# Patient Record
Sex: Male | Born: 1935
Health system: Southern US, Community
[De-identification: ages and names within clinical notes are randomized; demographics above are authoritative.]

## PROBLEM LIST (undated history)

## (undated) DIAGNOSIS — M719 Bursopathy, unspecified: Secondary | ICD-10-CM

## (undated) DIAGNOSIS — R351 Nocturia: Secondary | ICD-10-CM

## (undated) DIAGNOSIS — N189 Chronic kidney disease, unspecified: Secondary | ICD-10-CM

## (undated) DIAGNOSIS — E039 Hypothyroidism, unspecified: Secondary | ICD-10-CM

## (undated) DIAGNOSIS — D369 Benign neoplasm, unspecified site: Secondary | ICD-10-CM

## (undated) DIAGNOSIS — D890 Polyclonal hypergammaglobulinemia: Principal | ICD-10-CM

## (undated) DIAGNOSIS — J189 Pneumonia, unspecified organism: Secondary | ICD-10-CM

## (undated) DIAGNOSIS — K219 Gastro-esophageal reflux disease without esophagitis: Secondary | ICD-10-CM

## (undated) DIAGNOSIS — I10 Essential (primary) hypertension: Secondary | ICD-10-CM

## (undated) HISTORY — DX: Gastro-esophageal reflux disease without esophagitis: K21.9

## (undated) HISTORY — DX: Nocturia: R35.1

## (undated) HISTORY — DX: Essential (primary) hypertension: I10

## (undated) HISTORY — DX: Polyclonal hypergammaglobulinemia: D89.0

## (undated) HISTORY — PX: SKIN SURGERY: SHX2413

## (undated) HISTORY — PX: COLONOSCOPY: SHX174

## (undated) HISTORY — DX: Benign neoplasm, unspecified site: D36.9

## (undated) HISTORY — DX: Hypothyroidism, unspecified: E03.9

---

## 2004-10-17 ENCOUNTER — Ambulatory Visit (HOSPITAL_COMMUNITY): Admission: RE | Admit: 2004-10-17 | Discharge: 2004-10-17 | Payer: Self-pay | Admitting: Gastroenterology

## 2004-10-17 ENCOUNTER — Encounter (INDEPENDENT_AMBULATORY_CARE_PROVIDER_SITE_OTHER): Payer: Self-pay | Admitting: *Deleted

## 2009-05-10 ENCOUNTER — Encounter (INDEPENDENT_AMBULATORY_CARE_PROVIDER_SITE_OTHER): Payer: Self-pay | Admitting: Surgery

## 2009-05-10 ENCOUNTER — Ambulatory Visit (HOSPITAL_BASED_OUTPATIENT_CLINIC_OR_DEPARTMENT_OTHER): Admission: RE | Admit: 2009-05-10 | Discharge: 2009-05-10 | Payer: Self-pay | Admitting: Surgery

## 2011-01-12 ENCOUNTER — Other Ambulatory Visit: Payer: Self-pay | Admitting: Otolaryngology

## 2011-04-17 NOTE — Op Note (Signed)
NAME:  Lonnie [name], Lonnie [name] NO.:  [id number]   MEDICAL RECORD NO.:  [id number]          PATIENT TYPE:  AMB   LOCATION:  [place]                          FACILITY:  [facility]   PHYSICIAN:  Isabel Caprice. Hassell Done, MD  DATE OF BIRTH:  [birth year]   DATE OF PROCEDURE:  05/10/2009  DATE OF DISCHARGE:                               OPERATIVE REPORT   PREOPERATIVE DIAGNOSIS:  A 2-cm sebaceous cyst in middle of back.   POSTOPERATIVE DIAGNOSIS:  Liquefied sebaceous cyst.   PROCEDURE:  Excision and closure.   SURGEON:  Isabel Caprice. Hassell Done, MD   ANESTHESIA:  Local 1%.   DESCRIPTION OF PROCEDURE:  The area was prepped with Betadine.  Mr.  Bazil was in the prone position.  The area was injected with lidocaine  and an ellipse about 2 cm long was cut out including the punctum.  This  revealed a liquefied cyst.  I went ahead and was able to express the  portion that was not liquefied leaving the side and back wall  tenaciously stuck to the surrounding tissue.  I dissected these out as  best I could with a sharp blade dissection removing this.  When  complete, bleeding was controlled with compression and closed with 2-0  Vicryl subcutaneously and two 4-0 nylons.  The patient has a sterile  dressing applied and will be given doxycycline 100 mg to take 2 on day 1  and 1 daily for 5 days and will follow up for suture removal in about 7-  10 days.      Isabel Caprice Hassell Done, MD  Electronically Signed     MBM/MEDQ  D:  05/10/2009  T:  05/11/2009  Job:  [id number]   cc:   Berneta Sages, M.D.

## 2011-04-20 NOTE — Op Note (Signed)
NAME:  Lonnie [name], Lonnie [name] NO.:  [id number]   MEDICAL RECORD NO.:  [id number]          PATIENT TYPE:  AMB   LOCATION:  ENDO                         FACILITY:  [facility]   PHYSICIAN:  Tory Emerald. Benson Norway, MD    DATE OF BIRTH:  Aug 30, 1925   DATE OF PROCEDURE:  10/17/2004  DATE OF DISCHARGE:                                 OPERATIVE REPORT   PROCEDURE PERFORMED:  Colonoscopy.   INDICATIONS FOR PROCEDURE:  Screening colonoscopy and constipation.   REFERRING PHYSICIAN:  Olegario Shearer, M.D.   ENDOSCOPIST:  Tory Emerald. Benson Norway, MD   INSTRUMENT USED:  Olympus adult colonoscope.   PHYSICAL EXAMINATION:  CARDIAC:  Regular rate and rhythm.  LUNGS:  Clear to auscultation bilaterally.  ABDOMEN:  Flat, soft, nontender, nondistended, positive bowel sounds.   MEDICATIONS GIVEN:  Versed 6 mg IV, Demerol 60 mg IV.   CONSENT:  Informed consent was obtained from the patient describing the  risks of bleeding, infection, perforation, medication reaction, a 10% miss  rate for a small colon cancer or polyp and the risk of death, all of which  are not exclusive of any other complications that can occur.   DESCRIPTION OF PROCEDURE:  After adequate sedation was achieved a rectal  examination was performed which was negative for any palpable abnormalities.  The colonoscope was then inserted from the anus and advanced under direct  visualization to the terminal ileum without difficulty. The patient was  noted to have an excellent preparation and photodocumentation of the cecum  and terminal ileum was performed.  Upon withdrawal of the colonoscope, the  patient was noted to have two 3 mm polyps in the ascending colon that were  removed with a cold snare polypectomy.  Upon further withdrawal into the  transverse colon, an additional 3 mm sessile polyp was also removed with  cold snare polypectomy.  Good hemostasis was achieved.  No complications  were encountered.  There was no evidence of any  masses, polyps,  inflammation, ulcerations or erosions, vascular abnormalities or diverticula  in the descending or the sigmoid colon.  With retroflexion in the rectum the  patient is documented to have large external hemorrhoids.  At this time the  colonoscope was straightened and withdrawn from the patient.  The patient  tolerated the procedure well.   PLAN:  1.  Follow up on biopsies.  2.  Repeat colonoscopy in five years.  3.  Maintain a high fiber diet.       PDH/MEDQ  D:  10/17/2004  T:  10/17/2004  Job:  [id number]   cc:   Olegario Shearer, M.D.  [address]  [place] [address]  Fax: [phone]

## 2011-09-17 ENCOUNTER — Ambulatory Visit
Admission: RE | Admit: 2011-09-17 | Discharge: 2011-09-17 | Disposition: A | Discharge status: Home / Self Care | Payer: Medicare Other | Source: Ambulatory Visit | Attending: Family Medicine | Admitting: Family Medicine

## 2011-09-17 ENCOUNTER — Other Ambulatory Visit: Payer: Self-pay | Admitting: Family Medicine

## 2011-09-17 DIAGNOSIS — R609 Edema, unspecified: Secondary | ICD-10-CM

## 2011-12-10 DIAGNOSIS — H04129 Dry eye syndrome of unspecified lacrimal gland: Secondary | ICD-10-CM | POA: Diagnosis not present

## 2011-12-10 DIAGNOSIS — H16109 Unspecified superficial keratitis, unspecified eye: Secondary | ICD-10-CM | POA: Diagnosis not present

## 2011-12-14 DIAGNOSIS — N401 Enlarged prostate with lower urinary tract symptoms: Secondary | ICD-10-CM | POA: Diagnosis not present

## 2012-01-10 DIAGNOSIS — K219 Gastro-esophageal reflux disease without esophagitis: Secondary | ICD-10-CM | POA: Diagnosis not present

## 2012-01-10 DIAGNOSIS — E039 Hypothyroidism, unspecified: Secondary | ICD-10-CM | POA: Diagnosis not present

## 2012-01-10 DIAGNOSIS — I10 Essential (primary) hypertension: Secondary | ICD-10-CM | POA: Diagnosis not present

## 2012-01-11 ENCOUNTER — Encounter: Payer: Self-pay | Admitting: Internal Medicine

## 2012-01-17 DIAGNOSIS — R3915 Urgency of urination: Secondary | ICD-10-CM | POA: Diagnosis not present

## 2012-01-17 DIAGNOSIS — R350 Frequency of micturition: Secondary | ICD-10-CM | POA: Diagnosis not present

## 2012-01-24 DIAGNOSIS — R350 Frequency of micturition: Secondary | ICD-10-CM | POA: Diagnosis not present

## 2012-01-25 ENCOUNTER — Encounter: Payer: Self-pay | Admitting: Internal Medicine

## 2012-01-25 ENCOUNTER — Ambulatory Visit (INDEPENDENT_AMBULATORY_CARE_PROVIDER_SITE_OTHER): Payer: Medicare Other | Admitting: Internal Medicine

## 2012-01-25 VITALS — BP 142/70 | HR 68 | Ht 69.0 in | Wt 190.0 lb

## 2012-01-25 DIAGNOSIS — K219 Gastro-esophageal reflux disease without esophagitis: Secondary | ICD-10-CM

## 2012-01-25 DIAGNOSIS — Z86010 Personal history of colonic polyps: Secondary | ICD-10-CM | POA: Diagnosis not present

## 2012-01-25 NOTE — Progress Notes (Signed)
HISTORY OF PRESENT ILLNESS:  Lonnie [name] is a 78 y.o. male hypertension, hypothyroidism, and a history of adenomatous colon polyps. He presents today regarding surveillance colonoscopy. He was sent to the office first regarding evaluation due to his age. The patient had multiple adenomatous polyps on his index colonoscopy, with Dr. Carol Ada, in November 2005. I have reviewed that colonoscopy report as well as the pathology.. He apparently received a recall letter. He was interested in changing gastroenterologists as he wanted to have this procedure done, not at the hospital, but rather it an [place]. Despite his age he is quite active. No family history of colon cancer. He is quite interested in having a surveillance exam performed. GI review of systems negative. He does have GERD for which he takes ranitidine. No dysphagia. No reflux symptoms.. Recently retired for second time from the post office.  REVIEW OF SYSTEMS:  All non-GI ROS negative except for  Past Medical History  Diagnosis Date  . Adenomatous polyps   . GERD (gastroesophageal reflux disease)   . Hypothyroidism   . Hypertension   . Frequent urination at night     Past Surgical History  Procedure Date  . Skin surgery     back    Social History Lonnie [name]  reports that he quit smoking about 32 years ago. He has never used smokeless tobacco. He reports that he does not drink alcohol or use illicit drugs.  family history includes Diabetes in his brother and father and Heart disease in his brother.  There is no history of Colon cancer.  No Known Allergies     PHYSICAL EXAMINATION: Vital signs: BP 142/70  Pulse 68  Ht 5\' 9"  (1.753 m)  Wt 190 lb (86.183 kg)  BMI 28.06 kg/m2 General: Well-developed, well-nourished, no acute distress HEENT: Sclerae are anicteric, conjunctiva pink. Oral mucosa intact Lungs: Clear Heart: Regular Abdomen: soft, nontender, nondistended, no obvious ascites, no peritoneal signs,  normal bowel sounds. No organomegaly. Extremities: No edema Psychiatric: alert and oriented x3. Cooperative    ASSESSMENT:  #1. History of adenomatous colon polyps. Due for followup. Despite his advanced age, he is in excellent physical condition. He is quite interested in having his surveillance exam performed. We discussed the pros and cons of the strategy given his age. He is still interested. There are no contraindications. #2. GERD. Asymptomatic on H2 receptor antagonist therapy.   PLAN:  #1. Surveillance colonoscopy.The nature of the procedure, as well as the risks, benefits, and alternatives were carefully and thoroughly reviewed with the patient. Ample time for discussion and questions allowed. The patient understood, was satisfied, and agreed to proceed. Movi prep prescribed. The patient instructed on its use #2. Continue H2 receptor antagonist therapy and reflux precautions

## 2012-01-25 NOTE — Patient Instructions (Signed)
You have been scheduled for a colonoscopy with propofol. Please follow written instructions given to you at your visit today.  Please pick up your prep kit at the pharmacy within the next 1-3 days.  

## 2012-01-28 ENCOUNTER — Other Ambulatory Visit: Payer: Self-pay | Admitting: Internal Medicine

## 2012-01-28 ENCOUNTER — Other Ambulatory Visit: Payer: Self-pay

## 2012-01-28 MED ORDER — PEG-KCL-NACL-NASULF-NA ASC-C 100 G PO SOLR: 1.0000 | Freq: Once | ORAL | Status: DC

## 2012-02-05 NOTE — Telephone Encounter (Signed)
completed

## 2012-02-13 ENCOUNTER — Encounter: Payer: Self-pay | Admitting: Internal Medicine

## 2012-02-13 ENCOUNTER — Ambulatory Visit (AMBULATORY_SURGERY_CENTER): Payer: Medicare Other | Admitting: Internal Medicine

## 2012-02-13 VITALS — BP 177/78 | HR 64 | Temp 98.1°F | Resp 20 | Ht 69.0 in | Wt 190.0 lb

## 2012-02-13 DIAGNOSIS — K219 Gastro-esophageal reflux disease without esophagitis: Secondary | ICD-10-CM | POA: Diagnosis not present

## 2012-02-13 DIAGNOSIS — D126 Benign neoplasm of colon, unspecified: Secondary | ICD-10-CM

## 2012-02-13 DIAGNOSIS — Z86010 Personal history of colonic polyps: Secondary | ICD-10-CM

## 2012-02-13 DIAGNOSIS — Z1211 Encounter for screening for malignant neoplasm of colon: Secondary | ICD-10-CM

## 2012-02-13 DIAGNOSIS — E079 Disorder of thyroid, unspecified: Secondary | ICD-10-CM | POA: Diagnosis not present

## 2012-02-13 MED ORDER — SODIUM CHLORIDE 0.9 % IV SOLN: 500.0000 mL | INTRAVENOUS | Status: DC

## 2012-02-13 NOTE — Patient Instructions (Signed)

## 2012-02-13 NOTE — Progress Notes (Signed)
Propofol per l beeson crna. See scanned intra procedure report. ewm

## 2012-02-13 NOTE — Progress Notes (Signed)
Patient did not experience any of the following events: a burn prior to discharge; a fall within the facility; wrong site/side/patient/procedure/implant event; or a hospital transfer or hospital admission upon discharge from the facility. (G8907) Patient did not have preoperative order for IV antibiotic SSI prophylaxis. (G8918)  

## 2012-02-13 NOTE — Op Note (Signed)
[facility]. [place], [place]  [address]  COLONOSCOPY PROCEDURE REPORT  PATIENT:  Lonnie [name], Lonnie [name]  MR#:  [id number] BIRTHDATE:  12-25-1924, 86 yrs. old  GENDER:  male ENDOSCOPIST:  Docia Chuck. Geri Seminole, MD REF. BY:  Prince Solian, M.D. PROCEDURE DATE:  02/13/2012 PROCEDURE:  Colonoscopy with snare polypectomy x 2 ASA CLASS:  Class II INDICATIONS:  history of pre-cancerous (adenomatous) colon polyps, surveillance and high-risk screening ; 2005, multiple polyps, TVA - Dr Benson Norway MEDICATIONS:   MAC sedation, administered by CRNA, propofol (Diprivan) 250 mg IV  DESCRIPTION OF PROCEDURE:   After the risks benefits and alternatives of the procedure were thoroughly explained, informed consent was obtained.  Digital rectal exam was performed and revealed no abnormalities.   The LB 180AL [id number] endoscope was introduced through the anus and advanced to the cecum, which was identified by both the appendix and ileocecal valve, without limitations.  The quality of the prep was excellent, using MoviPrep.  The instrument was then slowly withdrawn as the colon was fully examined. <<PROCEDUREIMAGES>>  FINDINGS:  A 68mm sessile polyp was found at the ileocecal valve. Polyp was snared piecemeal, then cauterized with monopolar cautery. Retrieval was successful.  A 52mm pedunculated polyp was found in the ascending colon. Polyp was snared without cautery. Retrieval was successful. Severe diverticulosis was found ascending colon and sigmoid colon.   Retroflexed views in the rectum revealed no abnormalities.    The time to cecum =   3:59 minutes. The scope was then withdrawn in 22:07  minutes from the cecum and the procedure completed.  COMPLICATIONS:  None  ENDOSCOPIC IMPRESSION: 1) Large Sessile polyp at the ileocecal valve - removed piecemeal 2) Pedunculated polyp in the ascending colon - removed 3) Severe diverticulosis ascending colon and sigmoid  colon  RECOMMENDATIONS: 1) Await pathology results 2) Repeat Colonoscopy in 3 - 4 months  ______________________________ Docia Chuck. Geri Seminole, MD  CC:  Prince Solian, MD; The Patient  n. eSIGNED:   Docia Chuck. Geri Seminole at 02/13/2012 03:15 PM  Hiram Gash, [id number]

## 2012-02-14 ENCOUNTER — Telehealth: Payer: Self-pay | Admitting: *Deleted

## 2012-02-14 NOTE — Telephone Encounter (Signed)
  Follow up Call-  Call back number 02/13/2012  Post procedure Call Back phone  # [phone]  Permission to leave phone message Yes     Patient questions:  Do you have a fever, pain , or abdominal swelling? no Pain Score  0 *  Have you tolerated food without any problems? yes  Have you been able to return to your normal activities? yes  Do you have any questions about your discharge instructions: Diet   no Medications  no Follow up visit  no  Do you have questions or concerns about your Care? no  Actions: * If pain score is 4 or above: No action needed, pain <4.

## 2012-02-19 ENCOUNTER — Encounter: Payer: Self-pay | Admitting: Internal Medicine

## 2012-02-20 DIAGNOSIS — R350 Frequency of micturition: Secondary | ICD-10-CM | POA: Diagnosis not present

## 2012-02-20 DIAGNOSIS — N401 Enlarged prostate with lower urinary tract symptoms: Secondary | ICD-10-CM | POA: Diagnosis not present

## 2012-03-24 DIAGNOSIS — N401 Enlarged prostate with lower urinary tract symptoms: Secondary | ICD-10-CM | POA: Diagnosis not present

## 2012-04-02 DIAGNOSIS — H25049 Posterior subcapsular polar age-related cataract, unspecified eye: Secondary | ICD-10-CM | POA: Diagnosis not present

## 2012-04-08 DIAGNOSIS — H02409 Unspecified ptosis of unspecified eyelid: Secondary | ICD-10-CM | POA: Diagnosis not present

## 2012-04-08 DIAGNOSIS — H25049 Posterior subcapsular polar age-related cataract, unspecified eye: Secondary | ICD-10-CM | POA: Diagnosis not present

## 2012-04-14 DIAGNOSIS — H2510 Age-related nuclear cataract, unspecified eye: Secondary | ICD-10-CM | POA: Diagnosis not present

## 2012-04-14 DIAGNOSIS — H25049 Posterior subcapsular polar age-related cataract, unspecified eye: Secondary | ICD-10-CM | POA: Diagnosis not present

## 2012-05-02 DIAGNOSIS — N401 Enlarged prostate with lower urinary tract symptoms: Secondary | ICD-10-CM | POA: Diagnosis not present

## 2012-05-16 ENCOUNTER — Encounter: Payer: Self-pay | Admitting: Internal Medicine

## 2012-07-02 ENCOUNTER — Ambulatory Visit (AMBULATORY_SURGERY_CENTER): Payer: Medicare Other

## 2012-07-02 VITALS — Ht 69.75 in | Wt 187.3 lb

## 2012-07-02 DIAGNOSIS — Z860100 Personal history of colon polyps, unspecified: Secondary | ICD-10-CM

## 2012-07-02 MED ORDER — MOVIPREP 100 G PO SOLR: 1.0000 | Freq: Once | ORAL | Status: DC

## 2012-07-07 DIAGNOSIS — R351 Nocturia: Secondary | ICD-10-CM | POA: Diagnosis not present

## 2012-07-07 DIAGNOSIS — R350 Frequency of micturition: Secondary | ICD-10-CM | POA: Diagnosis not present

## 2012-07-14 DIAGNOSIS — E039 Hypothyroidism, unspecified: Secondary | ICD-10-CM | POA: Diagnosis not present

## 2012-07-14 DIAGNOSIS — I10 Essential (primary) hypertension: Secondary | ICD-10-CM | POA: Diagnosis not present

## 2012-07-14 DIAGNOSIS — Z23 Encounter for immunization: Secondary | ICD-10-CM | POA: Diagnosis not present

## 2012-07-14 DIAGNOSIS — N400 Enlarged prostate without lower urinary tract symptoms: Secondary | ICD-10-CM | POA: Diagnosis not present

## 2012-07-14 DIAGNOSIS — D126 Benign neoplasm of colon, unspecified: Secondary | ICD-10-CM | POA: Diagnosis not present

## 2012-07-16 ENCOUNTER — Encounter: Payer: Self-pay | Admitting: Internal Medicine

## 2012-07-16 ENCOUNTER — Ambulatory Visit (AMBULATORY_SURGERY_CENTER): Payer: Medicare Other | Admitting: Internal Medicine

## 2012-07-16 VITALS — BP 165/74 | HR 72 | Temp 97.7°F | Resp 20 | Ht 69.0 in | Wt 187.0 lb

## 2012-07-16 DIAGNOSIS — D126 Benign neoplasm of colon, unspecified: Secondary | ICD-10-CM | POA: Diagnosis not present

## 2012-07-16 DIAGNOSIS — Z1211 Encounter for screening for malignant neoplasm of colon: Secondary | ICD-10-CM

## 2012-07-16 DIAGNOSIS — Z86010 Personal history of colonic polyps: Secondary | ICD-10-CM | POA: Diagnosis not present

## 2012-07-16 MED ORDER — SODIUM CHLORIDE 0.9 % IV SOLN: 500.0000 mL | INTRAVENOUS | Status: DC

## 2012-07-16 NOTE — Patient Instructions (Signed)
YOU HAD AN ENDOSCOPIC PROCEDURE TODAY AT THE [facility]: Refer to the procedure report that was given to you for any specific questions about what was found during the examination.  If the procedure report does not answer your questions, please call your gastroenterologist to clarify.  If you requested that your care partner not be given the details of your procedure findings, then the procedure report has been included in a sealed envelope for you to review at your convenience later.  YOU SHOULD EXPECT: Some feelings of bloating in the abdomen. Passage of more gas than usual.  Walking can help get rid of the air that was put into your GI tract during the procedure and reduce the bloating. If you had a lower endoscopy (such as a colonoscopy or flexible sigmoidoscopy) you may notice spotting of blood in your stool or on the toilet paper. If you underwent a bowel prep for your procedure, then you may not have a normal bowel movement for a few days.  DIET: Your first meal following the procedure should be a light meal and then it is ok to progress to your normal diet.  A half-sandwich or bowl of soup is an example of a good first meal.  Heavy or fried foods are harder to digest and may make you feel nauseous or bloated.  Likewise meals heavy in dairy and vegetables can cause extra gas to form and this can also increase the bloating.  Drink plenty of fluids but you should avoid alcoholic beverages for 24 hours.  ACTIVITY: Your care partner should take you home directly after the procedure.  You should plan to take it easy, moving slowly for the rest of the day.  You can resume normal activity the day after the procedure however you should NOT DRIVE or use heavy machinery for 24 hours (because of the sedation medicines used during the test).    SYMPTOMS TO REPORT IMMEDIATELY: A gastroenterologist can be reached at any hour.  During normal business hours, 8:30 AM to 5:00 PM Monday through Friday,  call [phone].  After hours and on weekends, please call the GI answering service at [phone] who will take a message and have the physician on call contact you.   Following lower endoscopy (colonoscopy or flexible sigmoidoscopy):  Excessive amounts of blood in the stool  Significant tenderness or worsening of abdominal pains  Swelling of the abdomen that is new, acute  Fever of 100F or higher  Following upper endoscopy (EGD)  Vomiting of blood or coffee ground material  New chest pain or pain under the shoulder blades  Painful or persistently difficult swallowing  New shortness of breath  Fever of 100F or higher  Black, tarry-looking stools  FOLLOW UP: If any biopsies were taken you will be contacted by phone or by letter within the next 1-3 weeks.  Call your gastroenterologist if you have not heard about the biopsies in 3 weeks.  Our staff will call the home number listed on your records the next business day following your procedure to check on you and address any questions or concerns that you may have at that time regarding the information given to you following your procedure. This is a courtesy call and so if there is no answer at the home number and we have not heard from you through the emergency physician on call, we will assume that you have returned to your regular daily activities without incident.  SIGNATURES/CONFIDENTIALITY: You and/or your care   partner have signed paperwork which will be entered into your electronic medical record.  These signatures attest to the fact that that the information above on your After Visit Summary has been reviewed and is understood.  Full responsibility of the confidentiality of this discharge information lies with you and/or your care-partner.   HANDOUTS ON [POLYPS, DIVERTICULOSIS, HIGH FIBER DIET

## 2012-07-16 NOTE — Progress Notes (Signed)
Patient did not have preoperative order for IV antibiotic SSI prophylaxis. (G8918)  Patient did not experience any of the following events: a burn prior to discharge; a fall within the facility; wrong site/side/patient/procedure/implant event; or a hospital transfer or hospital admission upon discharge from the facility. (G8907)  

## 2012-07-16 NOTE — Op Note (Signed)
[facility]. [place], [place]  [address]  COLONOSCOPY PROCEDURE REPORT  PATIENT:  Lonnie [name], Lonnie [name]  MR#:  [id number] BIRTHDATE:  1925-11-23, 87 yrs. old  GENDER:  male ENDOSCOPIST:  Docia Chuck. Geri Seminole, MD REF. BY:  Surveillance Program Recall, PROCEDURE DATE:  07/16/2012 PROCEDURE:  Colonoscopy with snare polypectomy x 5 ASA CLASS:  Class II INDICATIONS:  history of pre-cancerous (adenomatous) colon polyps, surveillance and high-risk screening ; hx multiple adenomas elsewhere. last exam here 02-2012 w/ large TVA - piecemeal MEDICATIONS:   MAC sedation, administered by CRNA, propofol (Diprivan) 170 mg IV  DESCRIPTION OF PROCEDURE:   After the risks benefits and alternatives of the procedure were thoroughly explained, informed consent was obtained.  Digital rectal exam was performed and revealed no abnormalities.   The LB CF-H180AL [id number] endoscope was introduced through the anus and advanced to the cecum, which was identified by both the appendix and ileocecal valve, without limitations.  The quality of the prep was good, using MoviPrep. The instrument was then slowly withdrawn as the colon was fully examined. <<PROCEDUREIMAGES>>  FINDINGS:  13mm sessile polyp was found at the proximal ileocecal valve (residual from prior polypectomy) was snared, then cauterized with monopolar cautery. Retrieval was successful.  Four additional polyps were found in the cecum (10mm), ascending  colon (37mm) and the hepatic flexure (9mm) and (52mm hot snare). Three Polyps were snared without cautery. Retrieval was successful. Mild diverticulosis was found in the left colon.   Retroflexed views in the rectum revealed no abnormalities.    The time to cecum = 5 minutes. The scope was then withdrawn in  19  minutes from the cecum and the procedure completed. COMPLICATIONS:  None ENDOSCOPIC IMPRESSION: 1) Sessile polyp at the ileocecal valve (residual) -removed 2) Four polyps cecum  to hepatic flexure - removed 3) Mild diverticulosis in the left colon  RECOMMENDATIONS: 1) Await pathology results. We will send you a letter, as before. 2) No routine follow up planned. Return to the care of your primary provider. GI follow up as needed  ______________________________ Docia Chuck. Geri Seminole, MD  CC:  The Patient; Lonnie Solian, MD  n. Lorrin MaisDocia Chuck. Geri Seminole at 07/16/2012 12:47 PM  Hiram Gash, [id number]

## 2012-07-17 ENCOUNTER — Telehealth: Payer: Self-pay | Admitting: *Deleted

## 2012-07-17 NOTE — Telephone Encounter (Signed)
error 

## 2012-07-17 NOTE — Telephone Encounter (Signed)
No answer, left message to call if questions or concerns. 

## 2012-07-22 ENCOUNTER — Encounter: Payer: Self-pay | Admitting: Internal Medicine

## 2012-08-25 DIAGNOSIS — N401 Enlarged prostate with lower urinary tract symptoms: Secondary | ICD-10-CM | POA: Diagnosis not present

## 2012-08-25 DIAGNOSIS — R350 Frequency of micturition: Secondary | ICD-10-CM | POA: Diagnosis not present

## 2012-09-10 DIAGNOSIS — H47219 Primary optic atrophy, unspecified eye: Secondary | ICD-10-CM | POA: Diagnosis not present

## 2012-09-10 DIAGNOSIS — Z23 Encounter for immunization: Secondary | ICD-10-CM | POA: Diagnosis not present

## 2012-09-15 ENCOUNTER — Other Ambulatory Visit: Payer: Self-pay | Admitting: Otolaryngology

## 2012-09-15 DIAGNOSIS — D3701 Neoplasm of uncertain behavior of lip: Secondary | ICD-10-CM | POA: Diagnosis not present

## 2012-09-15 DIAGNOSIS — D3705 Neoplasm of uncertain behavior of pharynx: Secondary | ICD-10-CM | POA: Diagnosis not present

## 2012-09-15 DIAGNOSIS — K1370 Unspecified lesions of oral mucosa: Secondary | ICD-10-CM | POA: Diagnosis not present

## 2012-11-12 ENCOUNTER — Other Ambulatory Visit: Payer: Self-pay

## 2012-11-12 ENCOUNTER — Emergency Department (HOSPITAL_COMMUNITY): Payer: Medicare Other

## 2012-11-12 ENCOUNTER — Emergency Department (HOSPITAL_COMMUNITY)
Admission: EM | Admit: 2012-11-12 | Discharge: 2012-11-12 | Disposition: A | Discharge status: Home / Self Care | Payer: Medicare Other | Attending: Emergency Medicine | Admitting: Emergency Medicine

## 2012-11-12 DIAGNOSIS — Z8719 Personal history of other diseases of the digestive system: Secondary | ICD-10-CM | POA: Diagnosis not present

## 2012-11-12 DIAGNOSIS — S0010XA Contusion of unspecified eyelid and periocular area, initial encounter: Secondary | ICD-10-CM | POA: Insufficient documentation

## 2012-11-12 DIAGNOSIS — Z87891 Personal history of nicotine dependence: Secondary | ICD-10-CM | POA: Diagnosis not present

## 2012-11-12 DIAGNOSIS — S0993XA Unspecified injury of face, initial encounter: Secondary | ICD-10-CM | POA: Diagnosis not present

## 2012-11-12 DIAGNOSIS — S199XXA Unspecified injury of neck, initial encounter: Secondary | ICD-10-CM | POA: Diagnosis not present

## 2012-11-12 DIAGNOSIS — S0003XA Contusion of scalp, initial encounter: Secondary | ICD-10-CM | POA: Diagnosis not present

## 2012-11-12 DIAGNOSIS — M542 Cervicalgia: Secondary | ICD-10-CM | POA: Diagnosis not present

## 2012-11-12 DIAGNOSIS — Y92410 Unspecified street and highway as the place of occurrence of the external cause: Secondary | ICD-10-CM | POA: Insufficient documentation

## 2012-11-12 DIAGNOSIS — Z860100 Personal history of colon polyps, unspecified: Secondary | ICD-10-CM | POA: Insufficient documentation

## 2012-11-12 DIAGNOSIS — Z79899 Other long term (current) drug therapy: Secondary | ICD-10-CM | POA: Insufficient documentation

## 2012-11-12 DIAGNOSIS — I10 Essential (primary) hypertension: Secondary | ICD-10-CM | POA: Diagnosis not present

## 2012-11-12 DIAGNOSIS — W010XXA Fall on same level from slipping, tripping and stumbling without subsequent striking against object, initial encounter: Secondary | ICD-10-CM | POA: Insufficient documentation

## 2012-11-12 DIAGNOSIS — W19XXXA Unspecified fall, initial encounter: Secondary | ICD-10-CM

## 2012-11-12 DIAGNOSIS — E039 Hypothyroidism, unspecified: Secondary | ICD-10-CM | POA: Diagnosis not present

## 2012-11-12 DIAGNOSIS — S0081XA Abrasion of other part of head, initial encounter: Secondary | ICD-10-CM

## 2012-11-12 DIAGNOSIS — Y9301 Activity, walking, marching and hiking: Secondary | ICD-10-CM | POA: Insufficient documentation

## 2012-11-12 DIAGNOSIS — S0510XA Contusion of eyeball and orbital tissues, unspecified eye, initial encounter: Secondary | ICD-10-CM | POA: Diagnosis not present

## 2012-11-12 DIAGNOSIS — IMO0002 Reserved for concepts with insufficient information to code with codable children: Secondary | ICD-10-CM | POA: Insufficient documentation

## 2012-11-12 DIAGNOSIS — S0083XA Contusion of other part of head, initial encounter: Secondary | ICD-10-CM | POA: Diagnosis not present

## 2012-11-12 DIAGNOSIS — H05239 Hemorrhage of unspecified orbit: Secondary | ICD-10-CM

## 2012-11-12 MED ORDER — BACITRACIN 500 UNIT/GM EX OINT
1.0000 "application " | TOPICAL_OINTMENT | Freq: Two times a day (BID) | CUTANEOUS | Status: DC
  Filled 2012-11-12: qty 0.9

## 2012-11-12 NOTE — Progress Notes (Signed)
Pt fell, striking his head on a sidewalk.  Not KO'd.  Suffered a large contusion on the left eyebrow with associated abrasions.  CT of head, maxillofacial and neck all negative.  Rx symptomatically with bacitracin ointment to abrasions bid, Ice packs to reduce swelling.  OK to release home.

## 2012-11-12 NOTE — ED Notes (Addendum)
Per report from [facility] and patient.  Mr. Lonnie [name] was walking on the sidewalk and tripped on a piece of concrete this resulted in a fall.  He has noted L sided facial trauma.  Denies any visual difficulty.  A/o x 3 denies any LOC.  Denies neck or back pain.  Abrasion noted to L wrist but denies pain.

## 2012-11-12 NOTE — ED Provider Notes (Signed)
Medical screening examination/treatment/procedure(s) were conducted as a shared visit with non-physician practitioner(s) and myself.  I personally evaluated the patient during the encounter Pt fell, striking his head on a sidewalk. Not KO'd. Suffered a large contusion on the left eyebrow with associated abrasions. CT of head, maxillofacial and neck all negative. Rx symptomatically with bacitracin ointment to abrasions bid, Ice packs to reduce swelling. OK to release home.       Mylinda Latina III, MD 11/12/12 2103

## 2012-11-12 NOTE — ED Provider Notes (Signed)
History     CSN: [id number]  Arrival date & time 11/12/12  1158   First MD Initiated Contact with Patient 11/12/12 1259      Chief Complaint  Patient presents with  . Fall  . Abrasion    (Consider location/radiation/quality/duration/timing/severity/associated sxs/prior treatment) HPI Comments: Patient reports he was walking down the sidewalk when his foot caught an uneven part in the sidewalk and he tripped and fell, striking his left face on the ground.  Denies syncope.  Denies any pain anywhere, focal neurological deficits, pain with EOM, visual changes, HA, neck pain.  Denies any lightheadedness, dizziness, CP, SOB.  Pt does take an 81mg  aspirin daily.  Denies other blood thinners.    Patient is a 78 y.o. male presenting with fall. The history is provided by the patient.  Fall Pertinent negatives include no numbness, no abdominal pain, no nausea, no vomiting and no headaches.    Past Medical History  Diagnosis Date  . Adenomatous polyps   . GERD (gastroesophageal reflux disease)   . Hypothyroidism   . Hypertension   . Frequent urination at night     Past Surgical History  Procedure Date  . Skin surgery     back    Family History  Problem Relation Age of Onset  . Diabetes Father   . Diabetes Brother   . Heart disease Brother   . Colon cancer Neg Hx   . Rectal cancer Neg Hx     History  Substance Use Topics  . Smoking status: Former Smoker    Quit date: 01/25/1980  . Smokeless tobacco: Never Used  . Alcohol Use: No     Comment: former alcoholic 32 years ago was last drink      Review of Systems  HENT: Negative for neck pain.   Eyes: Negative for pain and visual disturbance.  Respiratory: Negative for shortness of breath.   Cardiovascular: Negative for chest pain.  Gastrointestinal: Negative for nausea, vomiting and abdominal pain.  Musculoskeletal: Negative for myalgias, back pain and arthralgias.  Neurological: Negative for dizziness, syncope,  weakness, light-headedness, numbness and headaches.    Allergies  Review of patient's allergies indicates no known allergies.  Home Medications   Current Outpatient Rx  Name  Route  Sig  Dispense  Refill  . FINASTERIDE 5 MG PO TABS   Oral   Take 5 mg by mouth every evening.          [name] FLUTICASONE PROPIONATE 50 MCG/ACT NA SUSP   Nasal   Place 2 sprays into the nose daily.         [name] MYRBETRIQ 25 MG PO TB24   Oral   Take 25 mg by mouth every evening.          [name] POLYETHYLENE GLYCOL 3350 PO PACK   Oral   Take 17 g by mouth every evening.          [name] RANITIDINE HCL 75 MG PO TABS   Oral   Take 150 mg by mouth daily.         [name] TERAZOSIN HCL 5 MG PO CAPS   Oral   Take 5 mg by mouth at bedtime.           BP 171/81  Pulse 81  Temp 97.2 F (36.2 C) (Oral)  Resp 16  SpO2 98%  Physical Exam  Nursing note and vitals reviewed. Constitutional: He appears well-developed and well-nourished. No distress.  HENT:  Head: Normocephalic. Head is with  abrasion and with contusion.    Neck: Neck supple.  Cardiovascular: Normal rate and regular rhythm.   Pulmonary/Chest: Effort normal and breath sounds normal. No respiratory distress. He has no wheezes. He has no rales. He exhibits no tenderness.  Abdominal: Soft. He exhibits no distension. There is no tenderness. There is no rebound and no guarding.  Musculoskeletal: Normal range of motion. He exhibits no edema and no tenderness.       Spine nontender, no crepitus, no step-offs  Neurological: He is alert. He has normal strength. No cranial nerve deficit or sensory deficit. He exhibits normal muscle tone. Coordination normal. GCS eye subscore is 4. GCS verbal subscore is 5. GCS motor subscore is 6.  Skin: He is not diaphoretic.  Psychiatric: He has a normal mood and affect. His behavior is normal. Thought content normal.    ED Course  Procedures (including critical care time)  Labs Reviewed - No data to display Ct Head  Wo Contrast  11/12/2012  *RADIOLOGY REPORT*  Clinical Data:  Fall.  Left-sided facial abrasions and pain.  CT HEAD WITHOUT CONTRAST CT MAXILLOFACIAL WITHOUT CONTRAST CT CERVICAL SPINE WITHOUT CONTRAST  Technique:  Multidetector CT imaging of the head, cervical spine, and maxillofacial structures were performed using the standard protocol without intravenous contrast. Multiplanar CT image reconstructions of the cervical spine and maxillofacial structures were also generated.  Comparison:   None  CT HEAD  Findings: No acute cortical infarct, hemorrhage, or mass lesion is present.  Mild generalized atrophy is within normal limits for age. Mild periventricular white matter hypoattenuation is present bilaterally.  The ventricles are of normal size.  No significant extra-axial fluid collection is present.  A large left periorbital hematoma extends into the frontal scalp. There is no underlying fracture.  The globe and orbit are intact. The paranasal sinuses and mastoid air cells are clear.  The osseous skull is intact.  Atherosclerotic calcifications are present in the cavernous carotid arteries.  IMPRESSION:  1.  Prominent left periorbital hematoma without an underlying fracture. 2.  Atherosclerosis. 3.  Mild atrophy and white matter disease is likely within normal limits for age.  CT MAXILLOFACIAL  Findings:  A large left periorbital hematoma is present.  The majority of hemorrhage is supraorbital.  The underlying globe and orbit are intact.  No acute fracture is evident.  The nasal bones are intact.  The paranasal sinuses and mastoid air cells are clear. And upper metallic denture plate is in place.  The soft tissues over the remainder of the face are unremarkable.  IMPRESSION: Large left periorbital hematoma without an underlying fracture.  CT CERVICAL SPINE  Findings:   The cervical spine is imaged from the skull base through T1-2.  The vertebral body heights maintained.  Slight degenerative anterolisthesis is  present at C7-T1.  Alignment is otherwise anatomic.  There is ankylosis across the disc space at C4- 5 and C5-6 with significant obliteration severe loss of disc height at to C3-4.  No acute fracture or traumatic subluxation is evident. Multilevel uncovertebral and facet hypertrophy is present. Atherosclerotic calcifications are more prominent on the right.  IMPRESSION:  1.  Moderate spondylosis of the cervical spine. 2.  No acute fracture or traumatic subluxation.   Original Report Authenticated By: San Morelle, M.D.    Ct Cervical Spine Wo Contrast  11/12/2012  *RADIOLOGY REPORT*  Clinical Data:  Fall.  Left-sided facial abrasions and pain.  CT HEAD WITHOUT CONTRAST CT MAXILLOFACIAL WITHOUT CONTRAST CT CERVICAL SPINE WITHOUT CONTRAST  Technique:  Multidetector CT imaging of the head, cervical spine, and maxillofacial structures were performed using the standard protocol without intravenous contrast. Multiplanar CT image reconstructions of the cervical spine and maxillofacial structures were also generated.  Comparison:   None  CT HEAD  Findings: No acute cortical infarct, hemorrhage, or mass lesion is present.  Mild generalized atrophy is within normal limits for age. Mild periventricular white matter hypoattenuation is present bilaterally.  The ventricles are of normal size.  No significant extra-axial fluid collection is present.  A large left periorbital hematoma extends into the frontal scalp. There is no underlying fracture.  The globe and orbit are intact. The paranasal sinuses and mastoid air cells are clear.  The osseous skull is intact.  Atherosclerotic calcifications are present in the cavernous carotid arteries.  IMPRESSION:  1.  Prominent left periorbital hematoma without an underlying fracture. 2.  Atherosclerosis. 3.  Mild atrophy and white matter disease is likely within normal limits for age.  CT MAXILLOFACIAL  Findings:  A large left periorbital hematoma is present.  The majority of  hemorrhage is supraorbital.  The underlying globe and orbit are intact.  No acute fracture is evident.  The nasal bones are intact.  The paranasal sinuses and mastoid air cells are clear. And upper metallic denture plate is in place.  The soft tissues over the remainder of the face are unremarkable.  IMPRESSION: Large left periorbital hematoma without an underlying fracture.  CT CERVICAL SPINE  Findings:   The cervical spine is imaged from the skull base through T1-2.  The vertebral body heights maintained.  Slight degenerative anterolisthesis is present at C7-T1.  Alignment is otherwise anatomic.  There is ankylosis across the disc space at C4- 5 and C5-6 with significant obliteration severe loss of disc height at to C3-4.  No acute fracture or traumatic subluxation is evident. Multilevel uncovertebral and facet hypertrophy is present. Atherosclerotic calcifications are more prominent on the right.  IMPRESSION:  1.  Moderate spondylosis of the cervical spine. 2.  No acute fracture or traumatic subluxation.   Original Report Authenticated By: San Morelle, M.D.    Ct Maxillofacial Wo Cm  11/12/2012  *RADIOLOGY REPORT*  Clinical Data:  Fall.  Left-sided facial abrasions and pain.  CT HEAD WITHOUT CONTRAST CT MAXILLOFACIAL WITHOUT CONTRAST CT CERVICAL SPINE WITHOUT CONTRAST  Technique:  Multidetector CT imaging of the head, cervical spine, and maxillofacial structures were performed using the standard protocol without intravenous contrast. Multiplanar CT image reconstructions of the cervical spine and maxillofacial structures were also generated.  Comparison:   None  CT HEAD  Findings: No acute cortical infarct, hemorrhage, or mass lesion is present.  Mild generalized atrophy is within normal limits for age. Mild periventricular white matter hypoattenuation is present bilaterally.  The ventricles are of normal size.  No significant extra-axial fluid collection is present.  A large left periorbital hematoma  extends into the frontal scalp. There is no underlying fracture.  The globe and orbit are intact. The paranasal sinuses and mastoid air cells are clear.  The osseous skull is intact.  Atherosclerotic calcifications are present in the cavernous carotid arteries.  IMPRESSION:  1.  Prominent left periorbital hematoma without an underlying fracture. 2.  Atherosclerosis. 3.  Mild atrophy and white matter disease is likely within normal limits for age.  CT MAXILLOFACIAL  Findings:  A large left periorbital hematoma is present.  The majority of hemorrhage is supraorbital.  The underlying globe and orbit are intact.  No  acute fracture is evident.  The nasal bones are intact.  The paranasal sinuses and mastoid air cells are clear. And upper metallic denture plate is in place.  The soft tissues over the remainder of the face are unremarkable.  IMPRESSION: Large left periorbital hematoma without an underlying fracture.  CT CERVICAL SPINE  Findings:   The cervical spine is imaged from the skull base through T1-2.  The vertebral body heights maintained.  Slight degenerative anterolisthesis is present at C7-T1.  Alignment is otherwise anatomic.  There is ankylosis across the disc space at C4- 5 and C5-6 with significant obliteration severe loss of disc height at to C3-4.  No acute fracture or traumatic subluxation is evident. Multilevel uncovertebral and facet hypertrophy is present. Atherosclerotic calcifications are more prominent on the right.  IMPRESSION:  1.  Moderate spondylosis of the cervical spine. 2.  No acute fracture or traumatic subluxation.   Original Report Authenticated By: San Morelle, M.D.     1:56 PM Dr Monia Pouch made aware of the patient.   1. Fall   2. Facial abrasion   3. Periorbital hematoma     MDM  Elderly patient who takes a daily aspirin presented after a mechanical fall today resulting in left facial abrasions and left periorbital hematoma.  Pt denied pain throughout visit.  No visual  changes, no neurological deficits.  CT head, c-spine, and maxillofacial were negative for fracture.  Pt also seen by Dr Monia Pouch who shared patient's results with him.  Pt d/c home with instructions for wound care, follow up, and return precautions.         [place], [place] 11/12/12 [phone]

## 2012-11-17 DIAGNOSIS — K219 Gastro-esophageal reflux disease without esophagitis: Secondary | ICD-10-CM | POA: Diagnosis not present

## 2012-11-17 DIAGNOSIS — I6529 Occlusion and stenosis of unspecified carotid artery: Secondary | ICD-10-CM | POA: Diagnosis not present

## 2012-11-17 DIAGNOSIS — R269 Unspecified abnormalities of gait and mobility: Secondary | ICD-10-CM | POA: Diagnosis not present

## 2012-11-17 DIAGNOSIS — I10 Essential (primary) hypertension: Secondary | ICD-10-CM | POA: Diagnosis not present

## 2012-11-17 DIAGNOSIS — E039 Hypothyroidism, unspecified: Secondary | ICD-10-CM | POA: Diagnosis not present

## 2012-11-24 DIAGNOSIS — R609 Edema, unspecified: Secondary | ICD-10-CM | POA: Diagnosis not present

## 2012-11-24 DIAGNOSIS — S93609A Unspecified sprain of unspecified foot, initial encounter: Secondary | ICD-10-CM | POA: Diagnosis not present

## 2013-01-09 DIAGNOSIS — E039 Hypothyroidism, unspecified: Secondary | ICD-10-CM | POA: Diagnosis not present

## 2013-01-09 DIAGNOSIS — R21 Rash and other nonspecific skin eruption: Secondary | ICD-10-CM | POA: Diagnosis not present

## 2013-01-09 DIAGNOSIS — I10 Essential (primary) hypertension: Secondary | ICD-10-CM | POA: Diagnosis not present

## 2013-01-28 DIAGNOSIS — I10 Essential (primary) hypertension: Secondary | ICD-10-CM | POA: Diagnosis not present

## 2013-01-28 DIAGNOSIS — Z1331 Encounter for screening for depression: Secondary | ICD-10-CM | POA: Diagnosis not present

## 2013-01-28 DIAGNOSIS — E039 Hypothyroidism, unspecified: Secondary | ICD-10-CM | POA: Diagnosis not present

## 2013-03-23 DIAGNOSIS — M25519 Pain in unspecified shoulder: Secondary | ICD-10-CM | POA: Diagnosis not present

## 2013-03-23 DIAGNOSIS — I10 Essential (primary) hypertension: Secondary | ICD-10-CM | POA: Diagnosis not present

## 2013-04-16 DIAGNOSIS — E039 Hypothyroidism, unspecified: Secondary | ICD-10-CM | POA: Diagnosis not present

## 2013-04-16 DIAGNOSIS — R634 Abnormal weight loss: Secondary | ICD-10-CM | POA: Diagnosis not present

## 2013-04-16 DIAGNOSIS — I10 Essential (primary) hypertension: Secondary | ICD-10-CM | POA: Diagnosis not present

## 2013-04-16 DIAGNOSIS — R5383 Other fatigue: Secondary | ICD-10-CM | POA: Diagnosis not present

## 2013-04-16 DIAGNOSIS — Z6827 Body mass index (BMI) 27.0-27.9, adult: Secondary | ICD-10-CM | POA: Diagnosis not present

## 2013-04-16 DIAGNOSIS — R7989 Other specified abnormal findings of blood chemistry: Secondary | ICD-10-CM | POA: Diagnosis not present

## 2013-05-05 DIAGNOSIS — R5383 Other fatigue: Secondary | ICD-10-CM | POA: Diagnosis not present

## 2013-05-05 DIAGNOSIS — M25519 Pain in unspecified shoulder: Secondary | ICD-10-CM | POA: Diagnosis not present

## 2013-05-05 DIAGNOSIS — R5381 Other malaise: Secondary | ICD-10-CM | POA: Diagnosis not present

## 2013-05-05 DIAGNOSIS — I10 Essential (primary) hypertension: Secondary | ICD-10-CM | POA: Diagnosis not present

## 2013-05-05 DIAGNOSIS — E039 Hypothyroidism, unspecified: Secondary | ICD-10-CM | POA: Diagnosis not present

## 2013-05-12 DIAGNOSIS — M25519 Pain in unspecified shoulder: Secondary | ICD-10-CM | POA: Diagnosis not present

## 2013-05-23 DIAGNOSIS — R5381 Other malaise: Secondary | ICD-10-CM | POA: Diagnosis not present

## 2013-05-23 DIAGNOSIS — IMO0001 Reserved for inherently not codable concepts without codable children: Secondary | ICD-10-CM | POA: Diagnosis not present

## 2013-05-27 DIAGNOSIS — M25519 Pain in unspecified shoulder: Secondary | ICD-10-CM | POA: Diagnosis not present

## 2013-06-02 DIAGNOSIS — M542 Cervicalgia: Secondary | ICD-10-CM | POA: Diagnosis not present

## 2013-06-02 DIAGNOSIS — M25519 Pain in unspecified shoulder: Secondary | ICD-10-CM | POA: Diagnosis not present

## 2013-06-04 DIAGNOSIS — M542 Cervicalgia: Secondary | ICD-10-CM | POA: Diagnosis not present

## 2013-06-04 DIAGNOSIS — M25519 Pain in unspecified shoulder: Secondary | ICD-10-CM | POA: Diagnosis not present

## 2013-06-09 DIAGNOSIS — M25519 Pain in unspecified shoulder: Secondary | ICD-10-CM | POA: Diagnosis not present

## 2013-06-09 DIAGNOSIS — M542 Cervicalgia: Secondary | ICD-10-CM | POA: Diagnosis not present

## 2013-06-11 DIAGNOSIS — M542 Cervicalgia: Secondary | ICD-10-CM | POA: Diagnosis not present

## 2013-06-11 DIAGNOSIS — M25519 Pain in unspecified shoulder: Secondary | ICD-10-CM | POA: Diagnosis not present

## 2013-06-15 DIAGNOSIS — R5381 Other malaise: Secondary | ICD-10-CM | POA: Diagnosis not present

## 2013-06-15 DIAGNOSIS — I10 Essential (primary) hypertension: Secondary | ICD-10-CM | POA: Diagnosis not present

## 2013-06-15 DIAGNOSIS — Z6828 Body mass index (BMI) 28.0-28.9, adult: Secondary | ICD-10-CM | POA: Diagnosis not present

## 2013-06-15 DIAGNOSIS — H538 Other visual disturbances: Secondary | ICD-10-CM | POA: Diagnosis not present

## 2013-06-15 DIAGNOSIS — E039 Hypothyroidism, unspecified: Secondary | ICD-10-CM | POA: Diagnosis not present

## 2013-06-15 DIAGNOSIS — R51 Headache: Secondary | ICD-10-CM | POA: Diagnosis not present

## 2013-06-15 DIAGNOSIS — Z1331 Encounter for screening for depression: Secondary | ICD-10-CM | POA: Diagnosis not present

## 2013-06-16 DIAGNOSIS — M542 Cervicalgia: Secondary | ICD-10-CM | POA: Diagnosis not present

## 2013-06-16 DIAGNOSIS — M25519 Pain in unspecified shoulder: Secondary | ICD-10-CM | POA: Diagnosis not present

## 2013-06-16 DIAGNOSIS — I6789 Other cerebrovascular disease: Secondary | ICD-10-CM | POA: Diagnosis not present

## 2013-06-16 DIAGNOSIS — R51 Headache: Secondary | ICD-10-CM | POA: Diagnosis not present

## 2013-06-18 DIAGNOSIS — M542 Cervicalgia: Secondary | ICD-10-CM | POA: Diagnosis not present

## 2013-06-18 DIAGNOSIS — M25519 Pain in unspecified shoulder: Secondary | ICD-10-CM | POA: Diagnosis not present

## 2013-07-08 DIAGNOSIS — M25519 Pain in unspecified shoulder: Secondary | ICD-10-CM | POA: Diagnosis not present

## 2013-07-27 DIAGNOSIS — R350 Frequency of micturition: Secondary | ICD-10-CM | POA: Diagnosis not present

## 2013-07-27 DIAGNOSIS — N401 Enlarged prostate with lower urinary tract symptoms: Secondary | ICD-10-CM | POA: Diagnosis not present

## 2013-09-14 DIAGNOSIS — J301 Allergic rhinitis due to pollen: Secondary | ICD-10-CM | POA: Diagnosis not present

## 2013-09-14 DIAGNOSIS — K219 Gastro-esophageal reflux disease without esophagitis: Secondary | ICD-10-CM | POA: Diagnosis not present

## 2013-09-14 DIAGNOSIS — E039 Hypothyroidism, unspecified: Secondary | ICD-10-CM | POA: Diagnosis not present

## 2013-09-14 DIAGNOSIS — I10 Essential (primary) hypertension: Secondary | ICD-10-CM | POA: Diagnosis not present

## 2013-09-14 DIAGNOSIS — Z6828 Body mass index (BMI) 28.0-28.9, adult: Secondary | ICD-10-CM | POA: Diagnosis not present

## 2013-10-16 DIAGNOSIS — M25559 Pain in unspecified hip: Secondary | ICD-10-CM | POA: Diagnosis not present

## 2013-10-16 DIAGNOSIS — M169 Osteoarthritis of hip, unspecified: Secondary | ICD-10-CM | POA: Diagnosis not present

## 2013-10-16 DIAGNOSIS — Z6828 Body mass index (BMI) 28.0-28.9, adult: Secondary | ICD-10-CM | POA: Diagnosis not present

## 2013-10-22 DIAGNOSIS — M76899 Other specified enthesopathies of unspecified lower limb, excluding foot: Secondary | ICD-10-CM | POA: Diagnosis not present

## 2013-11-19 DIAGNOSIS — D692 Other nonthrombocytopenic purpura: Secondary | ICD-10-CM | POA: Diagnosis not present

## 2013-12-09 DIAGNOSIS — R143 Flatulence: Secondary | ICD-10-CM | POA: Diagnosis not present

## 2013-12-09 DIAGNOSIS — Z6828 Body mass index (BMI) 28.0-28.9, adult: Secondary | ICD-10-CM | POA: Diagnosis not present

## 2013-12-09 DIAGNOSIS — R141 Gas pain: Secondary | ICD-10-CM | POA: Diagnosis not present

## 2014-03-19 DIAGNOSIS — N183 Chronic kidney disease, stage 3 unspecified: Secondary | ICD-10-CM | POA: Diagnosis not present

## 2014-03-19 DIAGNOSIS — Z79899 Other long term (current) drug therapy: Secondary | ICD-10-CM | POA: Diagnosis not present

## 2014-03-19 DIAGNOSIS — R5381 Other malaise: Secondary | ICD-10-CM | POA: Diagnosis not present

## 2014-03-19 DIAGNOSIS — N400 Enlarged prostate without lower urinary tract symptoms: Secondary | ICD-10-CM | POA: Diagnosis not present

## 2014-03-19 DIAGNOSIS — Z6828 Body mass index (BMI) 28.0-28.9, adult: Secondary | ICD-10-CM | POA: Diagnosis not present

## 2014-03-19 DIAGNOSIS — E039 Hypothyroidism, unspecified: Secondary | ICD-10-CM | POA: Diagnosis not present

## 2014-03-19 DIAGNOSIS — K219 Gastro-esophageal reflux disease without esophagitis: Secondary | ICD-10-CM | POA: Diagnosis not present

## 2014-03-19 DIAGNOSIS — R5383 Other fatigue: Secondary | ICD-10-CM | POA: Diagnosis not present

## 2014-03-19 DIAGNOSIS — I10 Essential (primary) hypertension: Secondary | ICD-10-CM | POA: Diagnosis not present

## 2014-03-23 DIAGNOSIS — N183 Chronic kidney disease, stage 3 unspecified: Secondary | ICD-10-CM | POA: Diagnosis not present

## 2014-03-31 DIAGNOSIS — N183 Chronic kidney disease, stage 3 unspecified: Secondary | ICD-10-CM | POA: Diagnosis not present

## 2014-03-31 DIAGNOSIS — Z79899 Other long term (current) drug therapy: Secondary | ICD-10-CM | POA: Diagnosis not present

## 2014-04-08 DIAGNOSIS — N183 Chronic kidney disease, stage 3 unspecified: Secondary | ICD-10-CM | POA: Diagnosis not present

## 2014-04-20 ENCOUNTER — Telehealth: Payer: Self-pay | Admitting: Internal Medicine

## 2014-04-20 NOTE — Telephone Encounter (Signed)
C/D 04/20/14 for appt. 05/05/14

## 2014-04-20 NOTE — Telephone Encounter (Signed)
S/W PATIENT AND GAVE NP APPT FOR 06/03 @ 1:30 W/DR. CHISM REFERRING DR. Dagmar Hait DX- PROTEINURIA WELCOME PACKET MAILED.

## 2014-05-05 ENCOUNTER — Ambulatory Visit (HOSPITAL_BASED_OUTPATIENT_CLINIC_OR_DEPARTMENT_OTHER): Payer: Medicare Other | Admitting: Internal Medicine

## 2014-05-05 ENCOUNTER — Encounter: Payer: Self-pay | Admitting: Internal Medicine

## 2014-05-05 ENCOUNTER — Telehealth: Payer: Self-pay | Admitting: Internal Medicine

## 2014-05-05 ENCOUNTER — Other Ambulatory Visit (HOSPITAL_BASED_OUTPATIENT_CLINIC_OR_DEPARTMENT_OTHER): Payer: Medicare Other

## 2014-05-05 ENCOUNTER — Other Ambulatory Visit: Payer: Self-pay | Admitting: Internal Medicine

## 2014-05-05 ENCOUNTER — Ambulatory Visit: Payer: Medicare Other

## 2014-05-05 VITALS — BP 148/74 | HR 70 | Temp 97.8°F | Resp 18 | Ht 69.0 in | Wt 190.2 lb

## 2014-05-05 DIAGNOSIS — E039 Hypothyroidism, unspecified: Secondary | ICD-10-CM | POA: Diagnosis not present

## 2014-05-05 DIAGNOSIS — R809 Proteinuria, unspecified: Secondary | ICD-10-CM | POA: Diagnosis not present

## 2014-05-05 DIAGNOSIS — D649 Anemia, unspecified: Secondary | ICD-10-CM | POA: Diagnosis not present

## 2014-05-05 DIAGNOSIS — R944 Abnormal results of kidney function studies: Secondary | ICD-10-CM | POA: Diagnosis not present

## 2014-05-05 LAB — COMPREHENSIVE METABOLIC PANEL (CC13)
ALBUMIN: 3.8 g/dL (ref 3.5–5.0)
ALT: 12 U/L (ref 0–55)
ANION GAP: 12 meq/L — AB (ref 3–11)
AST: 20 U/L (ref 5–34)
Alkaline Phosphatase: 103 U/L (ref 40–150)
BUN: 31.9 mg/dL — ABNORMAL HIGH (ref 7.0–26.0)
CHLORIDE: 103 meq/L (ref 98–109)
CO2: 20 meq/L — AB (ref 22–29)
Calcium: 9.1 mg/dL (ref 8.4–10.4)
Creatinine: 1.8 mg/dL — ABNORMAL HIGH (ref 0.7–1.3)
GLUCOSE: 93 mg/dL (ref 70–140)
POTASSIUM: 4.8 meq/L (ref 3.5–5.1)
SODIUM: 135 meq/L — AB (ref 136–145)
TOTAL PROTEIN: 7.3 g/dL (ref 6.4–8.3)
Total Bilirubin: 0.34 mg/dL (ref 0.20–1.20)

## 2014-05-05 LAB — CBC WITH DIFFERENTIAL/PLATELET
BASO%: 1.1 % (ref 0.0–2.0)
Basophils Absolute: 0.1 10*3/uL (ref 0.0–0.1)
EOS ABS: 0.6 10*3/uL — AB (ref 0.0–0.5)
EOS%: 9.4 % — ABNORMAL HIGH (ref 0.0–7.0)
HCT: 36.4 % — ABNORMAL LOW (ref 38.4–49.9)
HGB: 12.1 g/dL — ABNORMAL LOW (ref 13.0–17.1)
LYMPH#: 1.3 10*3/uL (ref 0.9–3.3)
LYMPH%: 20.6 % (ref 14.0–49.0)
MCH: 30.3 pg (ref 27.2–33.4)
MCHC: 33.2 g/dL (ref 32.0–36.0)
MCV: 91.4 fL (ref 79.3–98.0)
MONO#: 0.6 10*3/uL (ref 0.1–0.9)
MONO%: 9.5 % (ref 0.0–14.0)
NEUT%: 59.4 % (ref 39.0–75.0)
NEUTROS ABS: 3.8 10*3/uL (ref 1.5–6.5)
PLATELETS: 255 10*3/uL (ref 140–400)
RBC: 3.98 10*6/uL — AB (ref 4.20–5.82)
RDW: 13 % (ref 11.0–14.6)
WBC: 6.4 10*3/uL (ref 4.0–10.3)

## 2014-05-05 LAB — LACTATE DEHYDROGENASE (CC13): LDH: 180 U/L (ref 125–245)

## 2014-05-05 NOTE — Progress Notes (Signed)
[facility] [phone]   [phone]  NEW PATIENT EVALUATION   Name: Lonnie [name] Date: 05/05/2014 MRN: [id number] DOB: 07/29/1925  PCP: Tivis Ringer, MD   REFERRING PHYSICIAN: Avva, Pilar Plate, MD  REASON FOR REFERRAL: Proteinuria  HISTORY OF PRESENT ILLNESS:Carlas C Mchan is a 80 y.o. male who is being referred by Dr. Dagmar Hait for further evaluation of his proteinuria.  He was last evaluated by Dr. Dagmar Hait on Apr 19, 2014.  He was there for a routine follow up but complained of loss of energy.  He had stopped finsesteride due to breast enlargement.  He was having faigue and unable to perform his ADLs inclduing exercise.  He complained of feeling not rested and frequent nodding off when watching television.  It started about a few months ago.  He denies mood changes.  He has lost weight intentionally.  He reports a good appetite.  He denies any bone aches or pains.  He started Myrbetriq 25 mg every evening about one year ago.  He continues synthroid 88 mcg table daily for hypothyroidism.  He had his last colonoscopy on 07/16/2012 with removal of polpys.  His father died at age 25 due to DM2 complciations.  His mother died at age 50 due to aneursym.  He has 2 children who are living.  He was married for the past 67 years and wife passed away in 2007/01/22.  He attnded [employer] for 4 years and is now retired.  He has a history of tobacco use but quit in 1985-01-22.  He also has a history of alcoho use but quin in 1980.     His labs on 05/18,2015 revealed a total urine protein of 43.5 mg/dL and a 24 hour calculated protein of 978.8 mg/24 hours.  He had a SPEP which did not revieal an M-spike on 03/31/2014.  However, on 03/23/2014, his SPEP plus IFE showed IgG monoclonal protein with lambda light chain specificity.  His IgG level was normal at 1,240; IgA, 297 mg/dL; and IgM, 75 mg/dL.  His labs on 04/17 showed a creatinine of 1.8 (0.3 - 1.5 mg/dL); calcium of 9.2. Normal liver  function tests.  WBC of 6; Hgb of 11.3 with MCV of 93.4. And Plts of 249.  TSH was high at 5.09 and free t4 was 0.8 ng/mL. UA showed 2+ protein with negative nitrate and leukocytes. He lives with newphew in [place].  He has been in the AA for the last 35 years and he goes to weekly meetings.  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      PAST MEDICAL HISTORY:  has a past medical history of Adenomatous polyps; GERD (gastroesophageal reflux disease); Hypothyroidism; Hypertension; and Frequent urination at night.     PAST SURGICAL HISTORY: Past Surgical History  Procedure Laterality Date  . Skin surgery      back     CURRENT MEDICATIONS: has a current medication list which includes the following prescription(s): amlodipine, aspirin, escitalopram, fluticasone, myrbetriq, polyethylene glycol, ranitidine, and synthroid.   ALLERGIES: Review of patient's allergies indicates no known allergies.   SOCIAL HISTORY:  reports that he quit smoking about 34 years ago. He has never used smokeless tobacco. He reports that he does not drink alcohol or use illicit drugs.   FAMILY HISTORY: family history includes Diabetes in his brother and father; Heart disease in his brother. There is no history of Colon cancer or  Rectal cancer.   LABORATORY DATA:  Results for orders placed in visit on 05/05/14 (from the past 48 hour(s))  LACTATE DEHYDROGENASE (CC13)     Status: None   Collection Time    05/05/14  1:18 PM      Result Value Ref Range   LDH 180  125 - 245 U/L  CBC WITH DIFFERENTIAL     Status: Abnormal   Collection Time    05/05/14  1:19 PM      Result Value Ref Range   WBC 6.4  4.0 - 10.3 10e3/uL   NEUT# 3.8  1.5 - 6.5 10e3/uL   HGB 12.1 (*) 13.0 - 17.1 g/dL   HCT 36.4 (*) 38.4 - 49.9 %   Platelets 255  140 - 400 10e3/uL   MCV 91.4  79.3 - 98.0 fL   MCH 30.3  27.2 - 33.4 pg   MCHC 33.2  32.0 - 36.0 g/dL   RBC 3.98 (*) 4.20 - 5.82 10e6/uL     RDW 13.0  11.0 - 14.6 %   lymph# 1.3  0.9 - 3.3 10e3/uL   MONO# 0.6  0.1 - 0.9 10e3/uL   Eosinophils Absolute 0.6 (*) 0.0 - 0.5 10e3/uL   Basophils Absolute 0.1  0.0 - 0.1 10e3/uL   NEUT% 59.4  39.0 - 75.0 %   LYMPH% 20.6  14.0 - 49.0 %   MONO% 9.5  0.0 - 14.0 %   EOS% 9.4 (*) 0.0 - 7.0 %   BASO% 1.1  0.0 - 2.0 %  COMPREHENSIVE METABOLIC PANEL (QP59)     Status: Abnormal   Collection Time    05/05/14  1:19 PM      Result Value Ref Range   Sodium 135 (*) 136 - 145 mEq/L   Potassium 4.8  3.5 - 5.1 mEq/L   Chloride 103  98 - 109 mEq/L   CO2 20 (*) 22 - 29 mEq/L   Glucose 93  70 - 140 mg/dl   BUN 31.9 (*) 7.0 - 26.0 mg/dL   Creatinine 1.8 (*) 0.7 - 1.3 mg/dL   Total Bilirubin 0.34  0.20 - 1.20 mg/dL   Alkaline Phosphatase 103  40 - 150 U/L   AST 20  5 - 34 U/L   ALT 12  0 - 55 U/L   Total Protein 7.3  6.4 - 8.3 g/dL   Albumin 3.8  3.5 - 5.0 g/dL   Calcium 9.1  8.4 - 10.4 mg/dL   Anion Gap 12 (*) 3 - 11 mEq/L       RADIOGRAPHY: No results found.     REVIEW OF SYSTEMS:  Constitutional: Denies fevers, chills or abnormal weight loss Eyes: Denies blurriness of vision Ears, nose, mouth, throat, and face: Denies mucositis or sore throat Respiratory: Denies cough, dyspnea or wheezes Cardiovascular: Denies palpitation, chest discomfort or lower extremity swelling Gastrointestinal:  Denies nausea, heartburn or change in bowel habits Skin: Denies abnormal skin rashes Lymphatics: Denies new lymphadenopathy or easy bruising Neurological:Denies numbness, tingling or new weaknesses Behavioral/Psych: Mood is stable, no new changes  All other systems were reviewed with the patient and are negative.  PHYSICAL EXAM:  height is _0  (1.753 m) and weight is 190 lb 3.2 oz (86.274 kg). His oral temperature is 97.8 F (36.6 C). His blood pressure is 148/74 and his pulse is 70. His respiration is 18 and oxygen saturation is 100%.    GENERAL:alert, no distress and comfortable SKIN: skin  color, texture, turgor are normal,  no rashes or significant lesions EYES: normal, Conjunctiva are pink and non-injected, sclera clear OROPHARYNX:no exudate, no erythema and lips, buccal mucosa, and tongue normal  NECK: supple, thyroid normal size, non-tender, without nodularity LYMPH:  no palpable lymphadenopathy in the cervical, axillary or inguinal LUNGS: clear to auscultation and percussion with normal breathing effort HEART: regular rate & rhythm and no murmurs and no lower extremity edema ABDOMEN:abdomen soft, non-tender and normal bowel sounds Musculoskeletal:no cyanosis of digits and no clubbing  NEURO: alert & oriented x 3 with fluent speech, no focal motor/sensory deficits   IMPRESSION: PRESTEN JOOST is a 80 y.o. male with a history of    PLAN:  1.  Proteinuria. --We will check an SPEP with IFE, UPEP with IFE, kappa/lambda light chains and repeat his 24 hours urine.  If positive for IgG lambda, we will obtain a skeletal survey to examine for lytic lesions.  If abnormal Kappa:lambda ratio, we will obtain bone marrow biopsy to exclude plasma cells greater than 10%.  If MGUS, patient was informed that 1% chance of progression to Multiple myeloma per year.  If smothering MM, which is defined as plasma cells greater than 10% in bone marrow or Immuno globins greater than 3.5 g/dL without anemia (hbg less than 10) or kidney dysfunction (creatinine greater than 2.0) or hypercalcemia (Ca greater than 12), there is a 10% chance of progression per year for the first 5 years.   The patient's hemoglobin is 12.1 with normal WBC and Platelets.  His Creatinine is 1.8.    2. Elevated creatinine. --This could potentially be a result of senile kidney function.  Patient denies NSAID use or nephrotoxins.  He also admits to adequate hydration.  We will work up # 2 as a potential source as well.  His creatinine is 1.8.   3. Mild anemia. --He has a normocytic anemia.  This could be in part be explained due  to #1 or #2.  We will perform further anemia work-up as warranted.  He is clinically stable but does endorse increased fatigue. His hemoglobin is 12.1 with a normocytic MCV.   4. Hypothyroidism. --He is compliant to synthroid with a normal TSH as noted above.   5. Follow up.  --Patient will follow up in 2 weeks to discuss the results of his testing as indicated above.   All questions were answered. The patient knows to call the clinic with any problems, questions or concerns. We can certainly see the patient much sooner if necessary.  I spent 40 minutes counseling the patient face to face. The total time spent in the appointment was 60 minutes.    Concha Norway, MD 05/05/2014 2:50 PM

## 2014-05-05 NOTE — Progress Notes (Signed)
Checked in new patient with no financial issues at this time because he has not seen the dr. He has not been out of the country and he has his appt card

## 2014-05-05 NOTE — Telephone Encounter (Signed)
gv pt appt schedule for june.  °

## 2014-05-07 LAB — KAPPA/LAMBDA LIGHT CHAINS
Kappa free light chain: 5.91 mg/dL — ABNORMAL HIGH (ref 0.33–1.94)
Kappa:Lambda Ratio: 1.32 (ref 0.26–1.65)
LAMBDA FREE LGHT CHN: 4.49 mg/dL — AB (ref 0.57–2.63)

## 2014-05-07 LAB — SPEP & IFE WITH QIG
ALPHA-1-GLOBULIN: 5.3 % — AB (ref 2.9–4.9)
ALPHA-2-GLOBULIN: 10.1 % (ref 7.1–11.8)
Albumin ELP: 57.4 % (ref 55.8–66.1)
BETA GLOBULIN: 6 % (ref 4.7–7.2)
Beta 2: 3.4 % (ref 3.2–6.5)
GAMMA GLOBULIN: 17.8 % (ref 11.1–18.8)
IGG (IMMUNOGLOBIN G), SERUM: 1160 mg/dL (ref 650–1600)
IgA: 301 mg/dL (ref 68–379)
IgM, Serum: 70 mg/dL (ref 41–251)
Total Protein, Serum Electrophoresis: 6.8 g/dL (ref 6.0–8.3)

## 2014-05-18 ENCOUNTER — Encounter: Payer: Self-pay | Admitting: Internal Medicine

## 2014-05-18 ENCOUNTER — Other Ambulatory Visit: Payer: Medicare Other

## 2014-05-18 ENCOUNTER — Ambulatory Visit (HOSPITAL_BASED_OUTPATIENT_CLINIC_OR_DEPARTMENT_OTHER): Payer: Medicare Other | Admitting: Internal Medicine

## 2014-05-18 ENCOUNTER — Telehealth: Payer: Self-pay | Admitting: Oncology

## 2014-05-18 VITALS — BP 142/76 | HR 71 | Temp 98.0°F | Resp 18 | Ht 69.0 in | Wt 192.3 lb

## 2014-05-18 DIAGNOSIS — E039 Hypothyroidism, unspecified: Secondary | ICD-10-CM

## 2014-05-18 DIAGNOSIS — D472 Monoclonal gammopathy: Secondary | ICD-10-CM

## 2014-05-18 DIAGNOSIS — D649 Anemia, unspecified: Secondary | ICD-10-CM | POA: Diagnosis not present

## 2014-05-18 DIAGNOSIS — I10 Essential (primary) hypertension: Secondary | ICD-10-CM | POA: Diagnosis not present

## 2014-05-18 DIAGNOSIS — R809 Proteinuria, unspecified: Secondary | ICD-10-CM

## 2014-05-18 NOTE — Progress Notes (Signed)
[facility] OFFICE PROGRESS NOTE  Tivis Ringer, MD [place], [place]. [place] [address]  DIAGNOSIS: MGUS (monoclonal gammopathy of unknown significance) - Plan: CBC with Differential, Comprehensive metabolic panel (Cmet) - [facility], Lactate dehydrogenase (LDH) - [facility], Kappa/lambda light chains, SPEP & IFE with QIG  Chief Complaint  Patient presents with  . Proteinuria    CURRENT TREATMENT:   No history exists.     INTERVAL HISTORY: KARLTON BATSON 80 y.o. male  who was referred by Dr. Dagmar Hait for further evaluation of his proteinuria and initially seen on 05/05/2014 is here for follow up.   As previously reported, he was last evaluated by Dr. Dagmar Hait on Apr 19, 2014. He was there for a routine follow up but complained of loss of energy. He had stopped finsesteride due to breast enlargement. He was having faigue and unable to perform his ADLs inclduing exercise. He complained of feeling not rested and frequent nodding off when watching television. It started about a few months ago. He denies mood changes. He has lost weight intentionally. He reports a good appetite. He denies any bone aches or pains. He started Myrbetriq 25 mg every evening about one year ago. He continues synthroid 88 mcg table daily for hypothyroidism. He had his last colonoscopy on 07/16/2012 with removal of polpys. His father died at age 20 due to DM2 complciations. His mother died at age 82 due to aneursym. He has 2 children who are living. He was married for the past 51 years and wife passed away in Mar 21, 2007. He attnded [employer] for 4 years and is now retired. He has a history of tobacco use but quit in 03-20-1985. He also has a history of alcoho use but quin in 1980.   His labs on 05/18,2015 revealed a total urine protein of 43.5 mg/dL and a 24 hour calculated protein of 978.8 mg/24 hours. He had a SPEP which did not revieal an M-spike on 03/31/2014. However, on 03/23/2014, his SPEP plus IFE  showed IgG monoclonal protein with lambda light chain specificity. His IgG level was normal at 1,240; IgA, 297 mg/dL; and IgM, 75 mg/dL. His labs on 04/17 showed a creatinine of 1.8 (0.3 - 1.5 mg/dL); calcium of 9.2. Normal liver function tests. WBC of 6; Hgb of 11.3 with MCV of 93.4. And Plts of 249. TSH was high at 5.09 and free t4 was 0.8 ng/mL. UA showed 2+ protein with negative nitrate and leukocytes. He lives with newphew in [place]. He has been in the [employer] for the last 35 years and he goes to weekly meetings. 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   Today, he reports doing well overall with some mild fatigue.  He reports his weight is stable.    MEDICAL HISTORY: Past Medical History  Diagnosis Date  . Adenomatous polyps   . GERD (gastroesophageal reflux disease)   . Hypothyroidism   . Hypertension   . Frequent urination at night     INTERIM HISTORY: has Proteinuria on his problem list.    ALLERGIES:  has No Known Allergies.  MEDICATIONS: has a current medication list which includes the following prescription(s): amlodipine, aspirin, escitalopram, fluticasone, myrbetriq, polyethylene glycol, ranitidine, and synthroid.  SURGICAL HISTORY:  Past Surgical History  Procedure Laterality Date  . Skin surgery      back    REVIEW OF SYSTEMS:   Constitutional: Denies fevers, chills or abnormal weight loss  Eyes: Denies blurriness of vision Ears, nose, mouth, throat, and face: Denies mucositis or sore throat Respiratory: Denies cough, dyspnea or wheezes Cardiovascular: Denies palpitation, chest discomfort or lower extremity swelling Gastrointestinal:  Denies nausea, heartburn or change in bowel habits Skin: Denies abnormal skin rashes Lymphatics: Denies new lymphadenopathy or easy bruising Neurological:Denies numbness, tingling or new weaknesses Behavioral/Psych: Mood is stable, no new changes  All other systems were reviewed with  the patient and are negative.  PHYSICAL EXAMINATION: ECOG PERFORMANCE STATUS: 1 - Symptomatic but completely ambulatory  Blood pressure 142/76, pulse 71, temperature 98 F (36.7 C), temperature source Oral, resp. rate 18, height 5\' 9"  (1.753 m), weight 192 lb 4.8 oz (87.227 kg), SpO2 97.00%.  GENERAL:alert, no distress and comfortable; well developed and easily mobile to exam table.  HOH with hearing aides.  SKIN: skin color, texture, turgor are normal, no rashes or significant lesions EYES: normal, Conjunctiva are pink and non-injected, sclera clear OROPHARYNX:no exudate, no erythema and lips, buccal mucosa, and tongue normal  NECK: supple, thyroid normal size, non-tender, without nodularity LYMPH:  no palpable lymphadenopathy in the cervical, axillary or supraclavicular LUNGS: clear to auscultation with normal breathing effort, no wheezes or rhonchi HEART: regular rate & rhythm and no murmurs and no lower extremity edema ABDOMEN:abdomen soft, non-tender and normal bowel sounds Musculoskeletal:no cyanosis of digits and no clubbing  NEURO: alert & oriented x 3 with fluent speech, no focal motor/sensory deficits  Labs:  Lab Results  Component Value Date   WBC 6.4 05/05/2014   HGB 12.1* 05/05/2014   HCT 36.4* 05/05/2014   MCV 91.4 05/05/2014   PLT 255 05/05/2014   NEUTROABS 3.8 05/05/2014      Chemistry      Component Value Date/Time   NA 135* 05/05/2014 1319   K 4.8 05/05/2014 1319   CO2 20* 05/05/2014 1319   BUN 31.9* 05/05/2014 1319   CREATININE 1.8* 05/05/2014 1319      Component Value Date/Time   CALCIUM 9.1 05/05/2014 1319   ALKPHOS 103 05/05/2014 1319   AST 20 05/05/2014 1319   ALT 12 05/05/2014 1319   BILITOT 0.34 05/05/2014 1319       Basic Metabolic Panel: No results found for this basename: NA, K, CL, CO2, GLUCOSE, BUN, CREATININE, CALCIUM, MG, PHOS,  in the last 168 hours GFR Estimated Creatinine Clearance: 31.6 ml/min (by C-G formula based on Cr of 1.8). Liver Function Tests: No  results found for this basename: AST, ALT, ALKPHOS, BILITOT, PROT, ALBUMIN,  in the last 168 hours No results found for this basename: LIPASE, AMYLASE,  in the last 168 hours No results found for this basename: AMMONIA,  in the last 168 hours Coagulation profile No results found for this basename: INR, PROTIME,  in the last 168 hours  CBC: No results found for this basename: WBC, NEUTROABS, HGB, HCT, MCV, PLT,  in the last 168 hours  Anemia work up No results found for this basename: VITAMINB12, FOLATE, FERRITIN, TIBC, IRON, RETICCTPCT,  in the last 72 hours  Studies:  No results found.   RADIOGRAPHIC STUDIES: No results found.  ASSESSMENT: EBER HAWK 80 y.o. male with a history of MGUS (monoclonal gammopathy of unknown significance) - Plan: CBC with Differential, Comprehensive metabolic panel (Cmet) - [facility], Lactate dehydrogenase (LDH) - [facility], Kappa/lambda light chains, SPEP & IFE with QIG   PLAN:   1. Probable mild IgG Lambda MGUS.  --He had minor restricted lanes with  IgG lambda without an M-spike.  Per protocol, we will repeat his SPEP plus IFE in six months.  He likely has a mild MGUS. The patient's hemoglobin is 12.1 with normal WBC and Platelets. His Creatinine is 1.8.   2. Elevated creatinine.  --This could potentially be a result of senile kidney function. Patient denies NSAID use or nephrotoxins. He also admits to adequate hydration.  His creatinine is 1.8. This is likely secondary to #3.   3. Hypertension. --Moderately controlled on amlodipine.   4. Mild anemia.  --He has a normocytic anemia. This could be in part be explained due to  #2. We will perform further anemia work-up as warranted. He is clinically stable but does endorse increased fatigue. His hemoglobin is 12.1 with a normocytic MCV.   5. Hypothyroidism.  --He is compliant to synthroid with a normal TSH as noted above.   6. Follow up.  --Patient will follow up in 6 months to repeat SPEP plus IFE.    All questions were answered. The patient knows to call the clinic with any problems, questions or concerns. We can certainly see the patient much sooner if necessary.  I spent 15 minutes counseling the patient face to face. The total time spent in the appointment was 25 minutes.    Adanna Zuckerman, MD 05/19/2014 8:57 AM

## 2014-05-19 DIAGNOSIS — D472 Monoclonal gammopathy: Secondary | ICD-10-CM | POA: Insufficient documentation

## 2014-05-20 LAB — UIFE/LIGHT CHAINS/TP QN, 24-HR UR
ALPHA 1 UR: DETECTED — AB
Albumin, U: DETECTED
Alpha 2, Urine: DETECTED — AB
Beta, Urine: DETECTED — AB
FREE KAPPA LT CHAINS, UR: 3.17 mg/dL — AB (ref 0.14–2.42)
FREE KAPPA/LAMBDA RATIO: 5.98 ratio (ref 2.04–10.37)
FREE LAMBDA EXCRETION/DAY: 15.9 mg/d
FREE LAMBDA LT CHAINS, UR: 0.53 mg/dL (ref 0.02–0.67)
Free Lt Chn Excr Rate: 95.1 mg/d
Gamma Globulin, Urine: DETECTED — AB
TIME-UPE24: 24 h
TOTAL PROTEIN, URINE-UPE24: 28.1 mg/dL
Total Protein, Urine-Ur/day: 843 mg/d — ABNORMAL HIGH (ref 10–140)
VOLUME, URINE-UPE24: 3000 mL

## 2014-05-30 ENCOUNTER — Emergency Department (INDEPENDENT_AMBULATORY_CARE_PROVIDER_SITE_OTHER)
Admission: EM | Admit: 2014-05-30 | Discharge: 2014-05-30 | Disposition: A | Discharge status: Home / Self Care | Payer: Medicare Other | Source: Home / Self Care | Attending: Emergency Medicine | Admitting: Emergency Medicine

## 2014-05-30 ENCOUNTER — Encounter (HOSPITAL_COMMUNITY): Payer: Self-pay | Admitting: Emergency Medicine

## 2014-05-30 DIAGNOSIS — L538 Other specified erythematous conditions: Secondary | ICD-10-CM

## 2014-05-30 DIAGNOSIS — L304 Erythema intertrigo: Secondary | ICD-10-CM | POA: Diagnosis present

## 2014-05-30 MED ORDER — CLOTRIMAZOLE 1 % EX CREA: 1.0000 "application " | TOPICAL_CREAM | Freq: Two times a day (BID) | CUTANEOUS | Status: DC

## 2014-05-30 NOTE — ED Provider Notes (Signed)
CSN: [id number]     Arrival date & time 05/30/14  1634 History   First MD Initiated Contact with Patient 05/30/14 1826     Chief Complaint  Patient presents with  . Groin Pain   (Consider location/radiation/quality/duration/timing/severity/associated sxs/prior Treatment) HPI  Rash  Location: right groin Duration: few weeks Character: right, burning Changes: worsening Treatments tried: alcohol History of similar rash: no Exposure/Infestation Concern: no  Red Flags: New Drug:no Mucocutaneous Involvement:no Fever/Systemic Illness:no   Past Medical History  Diagnosis Date  . Adenomatous polyps   . GERD (gastroesophageal reflux disease)   . Hypothyroidism   . Hypertension   . Frequent urination at night    Past Surgical History  Procedure Laterality Date  . Skin surgery      back   Family History  Problem Relation Age of Onset  . Diabetes Father   . Diabetes Brother   . Heart disease Brother   . Colon cancer Neg Hx   . Rectal cancer Neg Hx    History  Substance Use Topics  . Smoking status: Former Smoker    Quit date: 01/25/1980  . Smokeless tobacco: Never Used  . Alcohol Use: No     Comment: former alcoholic 32 years ago was last drink    Review of Systems See HPI  Allergies  Review of patient's allergies indicates no known allergies.  Home Medications   Prior to Admission medications   Medication Sig Start Date End Date Taking? Authorizing Provider  amLODipine (NORVASC) 5 MG tablet Take 1 tablet by mouth daily. 03/23/14   Historical Provider, MD  aspirin 81 MG tablet Take 81 mg by mouth daily.    Historical Provider, MD  clotrimazole (LOTRIMIN) 1 % cream Apply 1 application topically 2 (two) times daily. Use of 7-10 days. 05/30/14   Angelica Ran, MD  escitalopram (LEXAPRO) 5 MG tablet Take 1 tablet by mouth daily. 04/14/14   Historical Provider, MD  fluticasone (FLONASE) 50 MCG/ACT nasal spray Place 2 sprays into the nose daily.    Historical  Provider, MD  MYRBETRIQ 25 MG TB24 Take 25 mg by mouth every evening.  05/14/12   Historical Provider, MD  polyethylene glycol (MIRALAX / GLYCOLAX) packet Take 17 g by mouth every evening.     Historical Provider, MD  ranitidine (ZANTAC) 75 MG tablet Take 150 mg by mouth daily.    Historical Provider, MD  SYNTHROID 88 MCG tablet Take 1 tablet by mouth daily. 03/23/14   Historical Provider, MD   BP 136/72  Pulse 61  Temp(Src) 97.1 F (36.2 C) (Oral)  Resp 20  SpO2 97% Physical Exam Gen: elderly male, pleasant non ill appearing Skin: diffuse erythema in flexor fold of leg with mild serous drainage   ED Course  Procedures (including critical care time) Labs Review Labs Reviewed - No data to display  Imaging Review No results found.   MDM   1. Intertrigo    Prescribed clotrimazole topically twice a day x7 days. Given precautions for followup.    Angelica Ran, MD 05/30/14 1840

## 2014-05-30 NOTE — ED Notes (Signed)
Patient c/o right side groin pain onset 2 weeks ago. Patient reports that he used rubbing alcohol with no relief. Patient is alert and in no acute distress.

## 2014-05-30 NOTE — Discharge Instructions (Signed)
Lonnie [name],   You have a yeast infection of your groin. Do NOT use alcohol on this. Please use the anti-fungal cream twice a day for the next 7-10 days. Also, try to keep this area dry. If is is getting worse or not healing, then please follow up with your regular doctor.   Sincerely,   Dr. Maricela Bo

## 2014-06-02 NOTE — ED Provider Notes (Signed)
Medical screening examination/treatment/procedure(s) were performed by a resident physician and as supervising physician I was immediately available for consultation/collaboration.  Philipp Deputy, M.D.  Harden Mo, MD 06/02/14 [phone]

## 2014-06-07 DIAGNOSIS — N401 Enlarged prostate with lower urinary tract symptoms: Secondary | ICD-10-CM | POA: Diagnosis not present

## 2014-06-07 DIAGNOSIS — N139 Obstructive and reflux uropathy, unspecified: Secondary | ICD-10-CM | POA: Diagnosis not present

## 2014-06-21 DIAGNOSIS — M76899 Other specified enthesopathies of unspecified lower limb, excluding foot: Secondary | ICD-10-CM | POA: Diagnosis not present

## 2014-07-16 DIAGNOSIS — Z6827 Body mass index (BMI) 27.0-27.9, adult: Secondary | ICD-10-CM | POA: Diagnosis not present

## 2014-07-16 DIAGNOSIS — Z1331 Encounter for screening for depression: Secondary | ICD-10-CM | POA: Diagnosis not present

## 2014-07-16 DIAGNOSIS — I10 Essential (primary) hypertension: Secondary | ICD-10-CM | POA: Diagnosis not present

## 2014-07-16 DIAGNOSIS — N183 Chronic kidney disease, stage 3 unspecified: Secondary | ICD-10-CM | POA: Diagnosis not present

## 2014-07-16 DIAGNOSIS — R809 Proteinuria, unspecified: Secondary | ICD-10-CM | POA: Diagnosis not present

## 2014-07-16 DIAGNOSIS — E039 Hypothyroidism, unspecified: Secondary | ICD-10-CM | POA: Diagnosis not present

## 2014-07-16 DIAGNOSIS — M25559 Pain in unspecified hip: Secondary | ICD-10-CM | POA: Diagnosis not present

## 2014-07-16 DIAGNOSIS — N400 Enlarged prostate without lower urinary tract symptoms: Secondary | ICD-10-CM | POA: Diagnosis not present

## 2014-07-21 DIAGNOSIS — H47219 Primary optic atrophy, unspecified eye: Secondary | ICD-10-CM | POA: Diagnosis not present

## 2014-09-07 DIAGNOSIS — Z23 Encounter for immunization: Secondary | ICD-10-CM | POA: Diagnosis not present

## 2014-11-02 DIAGNOSIS — M7062 Trochanteric bursitis, left hip: Secondary | ICD-10-CM | POA: Diagnosis not present

## 2014-11-09 ENCOUNTER — Telehealth: Payer: Self-pay | Admitting: Internal Medicine

## 2014-11-09 NOTE — Telephone Encounter (Signed)
moved from [facility] 12/16 to [facility] 12/24. lmonvm for pt and mailed schedule.

## 2014-11-17 ENCOUNTER — Ambulatory Visit: Payer: Medicare Other

## 2014-11-17 ENCOUNTER — Other Ambulatory Visit: Payer: Medicare Other

## 2014-11-25 ENCOUNTER — Other Ambulatory Visit: Payer: Self-pay | Admitting: Medical Oncology

## 2014-11-25 ENCOUNTER — Ambulatory Visit (HOSPITAL_BASED_OUTPATIENT_CLINIC_OR_DEPARTMENT_OTHER): Payer: Medicare Other | Admitting: Internal Medicine

## 2014-11-25 ENCOUNTER — Telehealth: Payer: Self-pay | Admitting: Internal Medicine

## 2014-11-25 ENCOUNTER — Encounter: Payer: Self-pay | Admitting: Internal Medicine

## 2014-11-25 ENCOUNTER — Other Ambulatory Visit (HOSPITAL_BASED_OUTPATIENT_CLINIC_OR_DEPARTMENT_OTHER): Payer: Medicare Other

## 2014-11-25 VITALS — BP 101/90 | HR 64 | Temp 97.7°F | Resp 18 | Ht 69.0 in | Wt 195.4 lb

## 2014-11-25 DIAGNOSIS — D472 Monoclonal gammopathy: Secondary | ICD-10-CM

## 2014-11-25 LAB — COMPREHENSIVE METABOLIC PANEL (CC13)
ALK PHOS: 112 U/L (ref 40–150)
ALT: 17 U/L (ref 0–55)
AST: 20 U/L (ref 5–34)
Albumin: 3.5 g/dL (ref 3.5–5.0)
Anion Gap: 7 mEq/L (ref 3–11)
BILIRUBIN TOTAL: 0.3 mg/dL (ref 0.20–1.20)
BUN: 41.9 mg/dL — ABNORMAL HIGH (ref 7.0–26.0)
CO2: 24 mEq/L (ref 22–29)
CREATININE: 2.3 mg/dL — AB (ref 0.7–1.3)
Calcium: 9 mg/dL (ref 8.4–10.4)
Chloride: 107 mEq/L (ref 98–109)
EGFR: 25 mL/min/{1.73_m2} — ABNORMAL LOW (ref >90)
Glucose: 103 mg/dl (ref 70–140)
Potassium: 4.8 mEq/L (ref 3.5–5.1)
SODIUM: 138 meq/L (ref 136–145)
TOTAL PROTEIN: 6.8 g/dL (ref 6.4–8.3)

## 2014-11-25 LAB — CBC WITH DIFFERENTIAL/PLATELET
BASO%: 0.6 % (ref 0.0–2.0)
Basophils Absolute: 0 10*3/uL (ref 0.0–0.1)
EOS%: 5.8 % (ref 0.0–7.0)
Eosinophils Absolute: 0.4 10*3/uL (ref 0.0–0.5)
HCT: 35.3 % — ABNORMAL LOW (ref 38.4–49.9)
HGB: 11.5 g/dL — ABNORMAL LOW (ref 13.0–17.1)
LYMPH%: 16 % (ref 14.0–49.0)
MCH: 29.9 pg (ref 27.2–33.4)
MCHC: 32.5 g/dL (ref 32.0–36.0)
MCV: 91.9 fL (ref 79.3–98.0)
MONO#: 0.5 10*3/uL (ref 0.1–0.9)
MONO%: 7.6 % (ref 0.0–14.0)
NEUT#: 4.3 10*3/uL (ref 1.5–6.5)
NEUT%: 70 % (ref 39.0–75.0)
PLATELETS: 240 10*3/uL (ref 140–400)
RBC: 3.84 10*6/uL — AB (ref 4.20–5.82)
RDW: 12.8 % (ref 11.0–14.6)
WBC: 6.1 10*3/uL (ref 4.0–10.3)
lymph#: 1 10*3/uL (ref 0.9–3.3)

## 2014-11-25 NOTE — Telephone Encounter (Signed)
Gave avs & cal for March. °

## 2014-11-25 NOTE — Telephone Encounter (Signed)
Pt was given schedule, labs/ov per 12/24 POF.... Cherylann Banas

## 2014-11-25 NOTE — Progress Notes (Signed)
[facility] Telephone:([phone]   Fax:([phone]) [place], [place] [address]  DIAGNOSIS: Monoclonal gammopathy of undetermined significance.  PRIOR THERAPY: None  CURRENT THERAPY: None  INTERVAL HISTORY: Lonnie [name] 80 y.o. male returns to the clinic today for six-month follow-up visit. He is a former patient of Dr. Juliann Mule. He has seen him in the past for monoclonal gammopathy of undetermined significance. The patient has been on observation with no intervention. He denied having any significant complaints today. He has no fever or chills, no nausea or vomiting. The patient denied having any significant weight loss or night sweats. The patient denied having any chest pain, shortness breath, cough or hemoptysis. He has repeat myeloma panel performed recently and is here for evaluation and discussion of his lab results.  MEDICAL HISTORY: Past Medical History  Diagnosis Date  . Adenomatous polyps   . GERD (gastroesophageal reflux disease)   . Hypothyroidism   . Hypertension   . Frequent urination at night     ALLERGIES:  has No Known Allergies.  MEDICATIONS:  Current Outpatient Prescriptions  Medication Sig Dispense Refill  . amLODipine (NORVASC) 5 MG tablet Take 1 tablet by mouth daily.    . clotrimazole (LOTRIMIN) 1 % cream Apply 1 application topically 2 (two) times daily. Use of 7-10 days. 30 g 0  . escitalopram (LEXAPRO) 5 MG tablet Take 1 tablet by mouth daily.    . finasteride (PROSCAR) 5 MG tablet Take 5 mg by mouth daily.    . fluticasone (FLONASE) 50 MCG/ACT nasal spray Place 2 sprays into the nose daily.    [name] MYRBETRIQ 25 MG TB24 Take 25 mg by mouth every evening.     . polyethylene glycol (MIRALAX / GLYCOLAX) packet Take 17 g by mouth every evening.     . ranitidine (ZANTAC) 75 MG tablet Take 150 mg by mouth daily.    [name] SYNTHROID 88 MCG tablet Take 1 tablet by mouth daily.     No  current facility-administered medications for this visit.    SURGICAL HISTORY:  Past Surgical History  Procedure Laterality Date  . Skin surgery      back    REVIEW OF SYSTEMS:  A comprehensive review of systems was negative except for: Constitutional: positive for fatigue   PHYSICAL EXAMINATION: General appearance: alert, cooperative, fatigued and no distress Head: Normocephalic, without obvious abnormality, atraumatic Neck: no adenopathy, no JVD, supple, symmetrical, trachea midline and thyroid not enlarged, symmetric, no tenderness/mass/nodules Lymph nodes: Cervical, supraclavicular, and axillary nodes normal. Resp: clear to auscultation bilaterally Back: symmetric, no curvature. ROM normal. No CVA tenderness. Cardio: regular rate and rhythm, S1, S2 normal, no murmur, click, rub or gallop GI: soft, non-tender; bowel sounds normal; no masses,  no organomegaly Extremities: extremities normal, atraumatic, no cyanosis or edema  ECOG PERFORMANCE STATUS: 1 - Symptomatic but completely ambulatory  Blood pressure 101/90, pulse 64, temperature 97.7 F (36.5 C), temperature source Oral, resp. rate 18, height 5\' 9"  (1.753 m), weight 195 lb 6.4 oz (88.633 kg), SpO2 100 %.  LABORATORY DATA: Lab Results  Component Value Date   WBC 6.1 11/25/2014   HGB 11.5* 11/25/2014   HCT 35.3* 11/25/2014   MCV 91.9 11/25/2014   PLT 240 11/25/2014      Chemistry      Component Value Date/Time   NA 135* 05/05/2014 1319   K 4.8 05/05/2014 1319   CO2 20*  05/05/2014 1319   BUN 31.9* 05/05/2014 1319   CREATININE 1.8* 05/05/2014 1319      Component Value Date/Time   CALCIUM 9.1 05/05/2014 1319   ALKPHOS 103 05/05/2014 1319   AST 20 05/05/2014 1319   ALT 12 05/05/2014 1319   BILITOT 0.34 05/05/2014 1319       RADIOGRAPHIC STUDIES: No results found.  ASSESSMENT AND PLAN: This is a very pleasant 80 years old white male with monoclonal, with C of undetermined significance. The patient is doing  fine today with no specific complaints. The recent myeloma workup showed no evidence for progression. I discussed the lab result with the patient today. I recommended for him to come back for follow-up visit in 3 months for reevaluation with repeat myeloma panel. The patient was advised to call immediately if he has any concerning symptoms in the interval. The patient voices understanding of current disease status and treatment options and is in agreement with the current care plan.  All questions were answered. The patient knows to call the clinic with any problems, questions or concerns. We can certainly see the patient much sooner if necessary.  Disclaimer: This note was dictated with voice recognition software. Similar sounding words can inadvertently be transcribed and may not be corrected upon review.

## 2014-12-01 LAB — SPEP & IFE WITH QIG
ALBUMIN ELP: 52.5 % — AB (ref 55.8–66.1)
ALPHA-1-GLOBULIN: 5.3 % — AB (ref 2.9–4.9)
ALPHA-2-GLOBULIN: 12.3 % — AB (ref 7.1–11.8)
BETA GLOBULIN: 6.1 % (ref 4.7–7.2)
Beta 2: 6.3 % (ref 3.2–6.5)
Gamma Globulin: 17.5 % (ref 11.1–18.8)
IGG (IMMUNOGLOBIN G), SERUM: 1160 mg/dL (ref 650–1600)
IgA: 309 mg/dL (ref 68–379)
IgM, Serum: 79 mg/dL (ref 41–251)
TOTAL PROTEIN, SERUM ELECTROPHOR: 6.5 g/dL (ref 6.0–8.3)

## 2014-12-01 LAB — BETA 2 MICROGLOBULIN, SERUM: Beta-2 Microglobulin: 7.52 mg/L — ABNORMAL HIGH (ref <=2.51)

## 2014-12-01 LAB — KAPPA/LAMBDA LIGHT CHAINS
KAPPA LAMBDA RATIO: 1.38 (ref 0.26–1.65)
Kappa free light chain: 6.01 mg/dL — ABNORMAL HIGH (ref 0.33–1.94)
Lambda Free Lght Chn: 4.35 mg/dL — ABNORMAL HIGH (ref 0.57–2.63)

## 2014-12-14 DIAGNOSIS — N401 Enlarged prostate with lower urinary tract symptoms: Secondary | ICD-10-CM | POA: Diagnosis not present

## 2014-12-14 DIAGNOSIS — R351 Nocturia: Secondary | ICD-10-CM | POA: Diagnosis not present

## 2014-12-14 DIAGNOSIS — R350 Frequency of micturition: Secondary | ICD-10-CM | POA: Diagnosis not present

## 2014-12-16 DIAGNOSIS — I10 Essential (primary) hypertension: Secondary | ICD-10-CM | POA: Diagnosis not present

## 2014-12-16 DIAGNOSIS — Z1389 Encounter for screening for other disorder: Secondary | ICD-10-CM | POA: Diagnosis not present

## 2014-12-16 DIAGNOSIS — R209 Unspecified disturbances of skin sensation: Secondary | ICD-10-CM | POA: Diagnosis not present

## 2014-12-16 DIAGNOSIS — Z6829 Body mass index (BMI) 29.0-29.9, adult: Secondary | ICD-10-CM | POA: Diagnosis not present

## 2014-12-16 DIAGNOSIS — E039 Hypothyroidism, unspecified: Secondary | ICD-10-CM | POA: Diagnosis not present

## 2014-12-16 DIAGNOSIS — N183 Chronic kidney disease, stage 3 (moderate): Secondary | ICD-10-CM | POA: Diagnosis not present

## 2014-12-16 DIAGNOSIS — D472 Monoclonal gammopathy: Secondary | ICD-10-CM | POA: Diagnosis not present

## 2015-02-21 ENCOUNTER — Other Ambulatory Visit (HOSPITAL_BASED_OUTPATIENT_CLINIC_OR_DEPARTMENT_OTHER): Payer: Medicare Other

## 2015-02-21 DIAGNOSIS — D472 Monoclonal gammopathy: Secondary | ICD-10-CM

## 2015-02-21 LAB — CBC WITH DIFFERENTIAL/PLATELET
BASO%: 1.1 % (ref 0.0–2.0)
BASOS ABS: 0.1 10*3/uL (ref 0.0–0.1)
EOS%: 8.7 % — ABNORMAL HIGH (ref 0.0–7.0)
Eosinophils Absolute: 0.6 10*3/uL — ABNORMAL HIGH (ref 0.0–0.5)
HCT: 34.6 % — ABNORMAL LOW (ref 38.4–49.9)
HEMOGLOBIN: 11.4 g/dL — AB (ref 13.0–17.1)
LYMPH#: 1 10*3/uL (ref 0.9–3.3)
LYMPH%: 14.7 % (ref 14.0–49.0)
MCH: 30.4 pg (ref 27.2–33.4)
MCHC: 32.9 g/dL (ref 32.0–36.0)
MCV: 92.3 fL (ref 79.3–98.0)
MONO#: 0.5 10*3/uL (ref 0.1–0.9)
MONO%: 7.7 % (ref 0.0–14.0)
NEUT%: 67.8 % (ref 39.0–75.0)
NEUTROS ABS: 4.5 10*3/uL (ref 1.5–6.5)
Platelets: 272 10*3/uL (ref 140–400)
RBC: 3.75 10*6/uL — AB (ref 4.20–5.82)
RDW: 12.9 % (ref 11.0–14.6)
WBC: 6.6 10*3/uL (ref 4.0–10.3)

## 2015-02-21 LAB — COMPREHENSIVE METABOLIC PANEL (CC13)
ALT: 13 U/L (ref 0–55)
AST: 19 U/L (ref 5–34)
Albumin: 3.6 g/dL (ref 3.5–5.0)
Alkaline Phosphatase: 107 U/L (ref 40–150)
Anion Gap: 9 mEq/L (ref 3–11)
BILIRUBIN TOTAL: 0.3 mg/dL (ref 0.20–1.20)
BUN: 48.8 mg/dL — ABNORMAL HIGH (ref 7.0–26.0)
CO2: 22 mEq/L (ref 22–29)
Calcium: 8.9 mg/dL (ref 8.4–10.4)
Chloride: 109 mEq/L (ref 98–109)
Creatinine: 2.6 mg/dL — ABNORMAL HIGH (ref 0.7–1.3)
EGFR: 21 mL/min/{1.73_m2} — ABNORMAL LOW (ref >90)
Glucose: 107 mg/dl (ref 70–140)
Potassium: 4.8 mEq/L (ref 3.5–5.1)
SODIUM: 140 meq/L (ref 136–145)
Total Protein: 7.3 g/dL (ref 6.4–8.3)

## 2015-02-21 LAB — LACTATE DEHYDROGENASE (CC13): LDH: 180 U/L (ref 125–245)

## 2015-02-23 LAB — KAPPA/LAMBDA LIGHT CHAINS
KAPPA FREE LGHT CHN: 14.7 mg/dL — AB (ref 0.33–1.94)
KAPPA LAMBDA RATIO: 2.43 — AB (ref 0.26–1.65)
LAMBDA FREE LGHT CHN: 6.06 mg/dL — AB (ref 0.57–2.63)

## 2015-02-23 LAB — IGG, IGA, IGM
IGA: 308 mg/dL (ref 68–379)
IgG (Immunoglobin G), Serum: 1430 mg/dL (ref 650–1600)
IgM, Serum: 72 mg/dL (ref 41–251)

## 2015-02-23 LAB — BETA 2 MICROGLOBULIN, SERUM: Beta-2 Microglobulin: 8.26 mg/L — ABNORMAL HIGH (ref <=2.51)

## 2015-02-28 ENCOUNTER — Other Ambulatory Visit: Payer: Medicare Other

## 2015-02-28 ENCOUNTER — Encounter: Payer: Self-pay | Admitting: Internal Medicine

## 2015-02-28 ENCOUNTER — Ambulatory Visit (HOSPITAL_BASED_OUTPATIENT_CLINIC_OR_DEPARTMENT_OTHER): Payer: Medicare Other | Admitting: Internal Medicine

## 2015-02-28 ENCOUNTER — Telehealth: Payer: Self-pay | Admitting: Internal Medicine

## 2015-02-28 VITALS — BP 143/66 | HR 71 | Temp 97.6°F | Resp 18 | Ht 69.0 in | Wt 196.3 lb

## 2015-02-28 DIAGNOSIS — D472 Monoclonal gammopathy: Secondary | ICD-10-CM

## 2015-02-28 NOTE — Progress Notes (Signed)
[facility] Telephone:([phone]   Fax:(336) [place], MD [place] [address]  DIAGNOSIS: Monoclonal gammopathy of undetermined significance.  PRIOR THERAPY: None  CURRENT THERAPY: Observation  INTERVAL HISTORY: Lonnie [name] 81 y.o. male returns to the clinic today for six-month follow-up visit. He denied having any significant complaints today. He does not remember if he had a bone marrow biopsy and aspirate in the past and I don't see any records of this procedure. He has no fever or chills, no nausea or vomiting. The patient denied having any significant weight loss or night sweats. The patient denied having any chest pain, shortness of breath, cough or hemoptysis. He has repeat myeloma panel performed recently and is here for evaluation and discussion of his lab results.  MEDICAL HISTORY: Past Medical History  Diagnosis Date  . Adenomatous polyps   . GERD (gastroesophageal reflux disease)   . Hypothyroidism   . Hypertension   . Frequent urination at night     ALLERGIES:  has No Known Allergies.  MEDICATIONS:  Current Outpatient Prescriptions  Medication Sig Dispense Refill  . amLODipine (NORVASC) 5 MG tablet Take 1 tablet by mouth daily.    . clotrimazole (LOTRIMIN) 1 % cream Apply 1 application topically 2 (two) times daily. Use of 7-10 days. 30 g 0  . escitalopram (LEXAPRO) 5 MG tablet Take 1 tablet by mouth daily.    . finasteride (PROSCAR) 5 MG tablet Take 5 mg by mouth daily.    . fluticasone (FLONASE) 50 MCG/ACT nasal spray Place 2 sprays into the nose daily.    [name] MYRBETRIQ 25 MG TB24 Take 25 mg by mouth every evening.     . polyethylene glycol (MIRALAX / GLYCOLAX) packet Take 17 g by mouth every evening.     . ranitidine (ZANTAC) 75 MG tablet Take 150 mg by mouth daily.    [name] SYNTHROID 88 MCG tablet Take 1 tablet by mouth daily.     No current facility-administered medications  for this visit.    SURGICAL HISTORY:  Past Surgical History  Procedure Laterality Date  . Skin surgery      back    REVIEW OF SYSTEMS:  A comprehensive review of systems was negative except for: Constitutional: positive for fatigue   PHYSICAL EXAMINATION: General appearance: alert, cooperative, fatigued and no distress Head: Normocephalic, without obvious abnormality, atraumatic Neck: no adenopathy, no JVD, supple, symmetrical, trachea midline and thyroid not enlarged, symmetric, no tenderness/mass/nodules Lymph nodes: Cervical, supraclavicular, and axillary nodes normal. Resp: clear to auscultation bilaterally Back: symmetric, no curvature. ROM normal. No CVA tenderness. Cardio: regular rate and rhythm, S1, S2 normal, no murmur, click, rub or gallop GI: soft, non-tender; bowel sounds normal; no masses,  no organomegaly Extremities: extremities normal, atraumatic, no cyanosis or edema  ECOG PERFORMANCE STATUS: 1 - Symptomatic but completely ambulatory  Blood pressure 143/66, pulse 71, temperature 97.6 F (36.4 C), temperature source Oral, resp. rate 18, height _0  (1.753 m), weight 196 lb 4.8 oz (89.041 kg), SpO2 100 %.  LABORATORY DATA: Lab Results  Component Value Date   WBC 6.6 02/21/2015   HGB 11.4* 02/21/2015   HCT 34.6* 02/21/2015   MCV 92.3 02/21/2015   PLT 272 02/21/2015      Chemistry      Component Value Date/Time   NA 140 02/21/2015 0846   K 4.8 02/21/2015 0846   CO2 22 02/21/2015 0846  BUN 48.8* 02/21/2015 0846   CREATININE 2.6* 02/21/2015 0846      Component Value Date/Time   CALCIUM 8.9 02/21/2015 0846   ALKPHOS 107 02/21/2015 0846   AST 19 02/21/2015 0846   ALT 13 02/21/2015 0846   BILITOT 0.30 02/21/2015 0846     Myeloma panel: Beta-2 microglobulin 8.26, free kappa light chain 14.70, free lambda light chain 6.06 with a kappa/lambda ratio 2.43. IgG 1430, IgA 308 and IgM 72  RADIOGRAPHIC STUDIES: No results found.  ASSESSMENT AND PLAN: This  is a very pleasant 81 years old white male with monoclonal, with C of undetermined significance. The patient is doing fine today with no specific complaints. The recent myeloma workup showed is concerning for disease progression with increase in the free kappa light chain.  I discussed the lab result with the patient today.  I will arrange for the patient to have a bone marrow biopsy and aspirate by interventional radiology. I will also order a skeletal bone survey and 24-hour urine protein electrophoreses. I will see him back for follow-up visit in one month for reevaluation and discussion of his treatment options based on the biopsy and other lab results. The patient was advised to call immediately if he has any concerning symptoms in the interval. The patient voices understanding of current disease status and treatment options and is in agreement with the current care plan.  All questions were answered. The patient knows to call the clinic with any problems, questions or concerns. We can certainly see the patient much sooner if necessary.  Disclaimer: This note was dictated with voice recognition software. Similar sounding words can inadvertently be transcribed and may not be corrected upon review.

## 2015-02-28 NOTE — Telephone Encounter (Signed)
Gave avs & calendar for April. Sent patient to labs

## 2015-03-02 DIAGNOSIS — D472 Monoclonal gammopathy: Secondary | ICD-10-CM | POA: Diagnosis not present

## 2015-03-03 LAB — UPEP/TP, 24-HR URINE
ALBUMIN UR 24 HR ELECTRO: 68.2 %
Alpha-1-Globulin, U: 13.1 %
Alpha-2-Globulin, U: 5.4 %
Beta Globulin, U: 7.4 %
COLLECTION INTERVAL: 24 h
Gamma Globulin, U: 5.9 %
TOTAL PROTEIN, URINE/DAY: 2225 mg/d — AB (ref 50–100)
TOTAL VOLUME, URINE: 2500 mL
Total Protein, Urine: 89 mg/dL

## 2015-03-08 ENCOUNTER — Other Ambulatory Visit: Payer: Self-pay | Admitting: Radiology

## 2015-03-09 ENCOUNTER — Ambulatory Visit (HOSPITAL_COMMUNITY)
Admission: RE | Admit: 2015-03-09 | Discharge: 2015-03-09 | Disposition: A | Discharge status: Home / Self Care | Payer: Medicare Other | Source: Ambulatory Visit | Attending: Internal Medicine | Admitting: Internal Medicine

## 2015-03-09 ENCOUNTER — Encounter (HOSPITAL_COMMUNITY): Payer: Self-pay

## 2015-03-09 DIAGNOSIS — D472 Monoclonal gammopathy: Secondary | ICD-10-CM | POA: Diagnosis not present

## 2015-03-09 DIAGNOSIS — E039 Hypothyroidism, unspecified: Secondary | ICD-10-CM | POA: Insufficient documentation

## 2015-03-09 DIAGNOSIS — Z87891 Personal history of nicotine dependence: Secondary | ICD-10-CM | POA: Insufficient documentation

## 2015-03-09 DIAGNOSIS — I10 Essential (primary) hypertension: Secondary | ICD-10-CM | POA: Insufficient documentation

## 2015-03-09 DIAGNOSIS — K219 Gastro-esophageal reflux disease without esophagitis: Secondary | ICD-10-CM | POA: Diagnosis not present

## 2015-03-09 DIAGNOSIS — D649 Anemia, unspecified: Secondary | ICD-10-CM | POA: Insufficient documentation

## 2015-03-09 LAB — CBC WITH DIFFERENTIAL/PLATELET
Basophils Absolute: 0 10*3/uL (ref 0.0–0.1)
Basophils Relative: 0 % (ref 0–1)
Eosinophils Absolute: 0.6 10*3/uL (ref 0.0–0.7)
Eosinophils Relative: 9 % — ABNORMAL HIGH (ref 0–5)
HCT: 35 % — ABNORMAL LOW (ref 39.0–52.0)
Hemoglobin: 11.6 g/dL — ABNORMAL LOW (ref 13.0–17.0)
LYMPHS ABS: 1.4 10*3/uL (ref 0.7–4.0)
LYMPHS PCT: 21 % (ref 12–46)
MCH: 30.4 pg (ref 26.0–34.0)
MCHC: 33.1 g/dL (ref 30.0–36.0)
MCV: 91.9 fL (ref 78.0–100.0)
Monocytes Absolute: 0.6 10*3/uL (ref 0.1–1.0)
Monocytes Relative: 9 % (ref 3–12)
NEUTROS PCT: 61 % (ref 43–77)
Neutro Abs: 4.4 10*3/uL (ref 1.7–7.7)
PLATELETS: 250 10*3/uL (ref 150–400)
RBC: 3.81 MIL/uL — AB (ref 4.22–5.81)
RDW: 12.5 % (ref 11.5–15.5)
WBC: 7 10*3/uL (ref 4.0–10.5)

## 2015-03-09 LAB — PROTIME-INR
INR: 1.01 (ref 0.00–1.49)
Prothrombin Time: 13.4 seconds (ref 11.6–15.2)

## 2015-03-09 LAB — BONE MARROW EXAM

## 2015-03-09 MED ORDER — MIDAZOLAM HCL 2 MG/2ML IJ SOLN
INTRAMUSCULAR | Status: AC | PRN
  Administered 2015-03-09 (×2): 0.5 mg via INTRAVENOUS
  Administered 2015-03-09: 1 mg via INTRAVENOUS

## 2015-03-09 MED ORDER — HYDROCODONE-ACETAMINOPHEN 5-325 MG PO TABS
1.0000 | ORAL_TABLET | ORAL | Status: DC | PRN
  Filled 2015-03-09: qty 2

## 2015-03-09 MED ORDER — FENTANYL CITRATE 0.05 MG/ML IJ SOLN
INTRAMUSCULAR | Status: AC | PRN
  Administered 2015-03-09: 50 ug via INTRAVENOUS

## 2015-03-09 MED ORDER — MIDAZOLAM HCL 2 MG/2ML IJ SOLN
INTRAMUSCULAR | Status: AC
  Filled 2015-03-09: qty 6

## 2015-03-09 MED ORDER — SODIUM CHLORIDE 0.9 % IV SOLN
INTRAVENOUS | Status: DC
  Administered 2015-03-09: 08:00:00 via INTRAVENOUS

## 2015-03-09 MED ORDER — FENTANYL CITRATE 0.05 MG/ML IJ SOLN
INTRAMUSCULAR | Status: AC
  Filled 2015-03-09: qty 4

## 2015-03-09 NOTE — H&P (Signed)
Chief Complaint: MGUS  Referring Physician(s): Mohamed,Mohamed  History of Present Illness: Lonnie [name] is a 81 y.o. male   Pt was being worked up for proteinuria 05/2014 Work up also revealed monoclonal gammopathy of undetermined significance Was referred to Dr Julien Nordmann and has followed with him since then Now scheduled for bone marrow bx  Past Medical History  Diagnosis Date  . Adenomatous polyps   . GERD (gastroesophageal reflux disease)   . Hypothyroidism   . Hypertension   . Frequent urination at night     Past Surgical History  Procedure Laterality Date  . Skin surgery      back    Allergies: Review of patient's allergies indicates no known allergies.  Medications: Prior to Admission medications   Medication Sig Start Date End Date Taking? Authorizing Provider  amLODipine (NORVASC) 5 MG tablet Take 1 tablet by mouth at bedtime.  03/23/14  Yes Historical Provider, MD  escitalopram (LEXAPRO) 5 MG tablet Take 1 tablet by mouth at bedtime.  04/14/14  Yes Historical Provider, MD  fluticasone (FLONASE) 50 MCG/ACT nasal spray Place 2 sprays into the nose daily as needed for allergies or rhinitis.    Yes Historical Provider, MD  glyBURIDE (DIABETA) 5 MG tablet Take 5 mg by mouth at bedtime.   Yes Historical Provider, MD  levothyroxine (SYNTHROID, LEVOTHROID) 88 MCG tablet Take 88 mcg by mouth at bedtime.   Yes Historical Provider, MD  mirabegron ER (MYRBETRIQ) 50 MG TB24 tablet Take 50 mg by mouth at bedtime.   Yes Historical Provider, MD  polyethylene glycol (MIRALAX / GLYCOLAX) packet Take 17 g by mouth every evening.    Yes Historical Provider, MD  polyvinyl alcohol (LIQUIFILM TEARS) 1.4 % ophthalmic solution Place 1-2 drops into both eyes as needed for dry eyes.   Yes Historical Provider, MD  psyllium (METAMUCIL) 58.6 % powder Take 1 packet by mouth at bedtime. Tablespoon and a half.   Yes Historical Provider, MD  ranitidine (ZANTAC) 150 MG capsule Take 150 mg by  mouth 2 (two) times daily.   Yes Historical Provider, MD  clotrimazole (LOTRIMIN) 1 % cream Apply 1 application topically 2 (two) times daily. Use of 7-10 days. Patient not taking: Reported on 03/07/2015 05/30/14   Angelica Ran, MD     Family History  Problem Relation Age of Onset  . Diabetes Father   . Diabetes Brother   . Heart disease Brother   . Colon cancer Neg Hx   . Rectal cancer Neg Hx     History   Social History  . Marital Status: Widowed    Spouse Name: N/A  . Number of Children: N/A  . Years of Education: N/A   Social History Main Topics  . Smoking status: Former Smoker    Quit date: 01/25/1980  . Smokeless tobacco: Never Used  . Alcohol Use: No     Comment: former alcoholic 32 years ago was last drink  . Drug Use: No  . Sexual Activity: Not on file   Other Topics Concern  . None   Social History Narrative     Review of Systems: A 12 point ROS discussed and pertinent positives are indicated in the HPI above.  All other systems are negative.  Review of Systems  Constitutional: Negative for fever, appetite change and unexpected weight change.  Respiratory: Negative for cough and shortness of breath.   Gastrointestinal: Negative for nausea, vomiting, abdominal pain and diarrhea.  Genitourinary: Negative for difficulty urinating.  Musculoskeletal: Negative for back pain and gait problem.  Neurological: Negative for dizziness and weakness.  Psychiatric/Behavioral: Negative for behavioral problems and confusion.     Vital Signs: BP 150/78 mmHg  Pulse 64  Temp(Src) 98.1 F (36.7 C) (Oral)  Resp 16  Ht 5\' 9"  (1.753 m)  Wt 89.018 kg (196 lb 4 oz)  BMI 28.97 kg/m2  SpO2 98%  Physical Exam  Constitutional: He is oriented to person, place, and time. He appears well-nourished.  Cardiovascular: Normal rate, regular rhythm and normal heart sounds.   No murmur heard. Pulmonary/Chest: Effort normal and breath sounds normal. He has no wheezes.    Abdominal: Soft. Bowel sounds are normal. There is no tenderness.  Musculoskeletal: Normal range of motion.  Neurological: He is alert and oriented to person, place, and time.  Skin: Skin is warm and dry.  Psychiatric: He has a normal mood and affect. His behavior is normal. Judgment and thought content normal.  Nursing note and vitals reviewed.   Mallampati Score:  MD Evaluation Airway: WNL Heart: WNL Abdomen: WNL Chest/ Lungs: WNL ASA  Classification: 2 Mallampati/Airway Score: Two  Imaging: No results found.  Labs:  CBC:  Recent Labs  05/05/14 1319 11/25/14 0854 02/21/15 0845 03/09/15 0755  WBC 6.4 6.1 6.6 7.0  HGB 12.1* 11.5* 11.4* 11.6*  HCT 36.4* 35.3* 34.6* 35.0*  PLT 255 240 272 250    COAGS: No results for input(s): INR, APTT in the last 8760 hours.  BMP:  Recent Labs  05/05/14 1319 11/25/14 0854 02/21/15 0846  NA 135* 138 140  K 4.8 4.8 4.8  CO2 20* 24 22  GLUCOSE 93 103 107  BUN 31.9* 41.9* 48.8*  CALCIUM 9.1 9.0 8.9  CREATININE 1.8* 2.3* 2.6*    LIVER FUNCTION TESTS:  Recent Labs  05/05/14 1319 11/25/14 0854 02/21/15 0846  BILITOT 0.34 0.30 0.30  AST 20 20 19   ALT 12 17 13   ALKPHOS 103 112 107  PROT 7.3 6.8 7.3  ALBUMIN 3.8 3.5 3.6    TUMOR MARKERS: No results for input(s): AFPTM, CEA, CA199, CHROMGRNA in the last 8760 hours.  Assessment and Plan:  MGUS Request for BM bx per Dr Julien Nordmann Risks and Benefits discussed with the patient including, but not limited to bleeding, infection, damage to adjacent structures or low yield requiring additional tests. All of the patient's questions were answered, patient is agreeable to proceed. Consent signed and in chart.   Thank you for this interesting consult.  I greatly enjoyed meeting Lonnie [name] and look forward to participating in their care.  Signed: Taje Tondreau A 03/09/2015, 8:10 AM   I spent a total of  20 Minutes   in face to face in clinical consultation, greater  than 50% of which was counseling/coordinating care for BM bx

## 2015-03-09 NOTE — Procedures (Signed)
CT guided bone marrow biopsy.  Minimal blood loss.  No immediate complication.   

## 2015-03-09 NOTE — Discharge Instructions (Signed)
Bone Marrow Aspiration and Bone Biopsy Examination of the bone marrow is a valuable test to diagnose blood disorders. A bone marrow biopsy takes a sample of bone and a small amount of fluid and cells from inside the bone. A bone marrow aspiration removes only the marrow. Bone marrow aspiration and bone biopsies are used to stage different disorders of the blood, such as leukemia. Staging will help your caregiver understand how far the disease has progressed.  The tests are also useful in diagnosing:  Fever of unknown origin (FUO).  Bacterial infections and other widespread fungal infections.  Cancers that have spread (metastasized) to the bone marrow.  Diseases that are characterized by a deficiency of an enzyme (storage diseases). This includes:  Niemann-Pick disease.  Gaucher disease. PROCEDURE  Sites used to get samples include:   Back of your hip bone (posterior iliac crest).  Both aspiration and biopsy.  Front of your hip bone (anterior iliac crest).  Both aspiration and biopsy.  Breastbone (sternum).  Aspiration from your breastbone (done only in adults). This method is rarely used. When you get a hip bone aspiration:  You are placed lying on your side with the upper knee brought up and flexed with the lower leg straight.  The site is prepared, cleaned with an antiseptic scrub, and draped. This keeps the biopsy area clean.  The skin and the area down to the lining of the bone (periosteum) are made numb with a local anesthetic.  The bone marrow aspiration needle is inserted. You will feel pressure on your bone.  Once inside the marrow cavity, a sample of bone marrow is sucked out (aspirated) for pathology slides.  The material collected for bone marrow slides is processed immediately by a technologist.  The technician selects the marrow particles to make the slides for pathology.  The marrow aspiration needle is removed. Then pressure is applied to the site with  gauze until bleeding has stopped. Following an aspiration, a bone marrow biopsy may be performed as well. The technique for this is very similar. A dressing is then applied.  RISKS AND COMPLICATIONS  The main complications of a bone marrow aspiration and biopsy include infection and bleeding.  Complications are uncommon. The procedure may not be performed in patients with bleeding tendencies.  A very rare complication from the procedure is injury to the heart during a breastbone (sternal) marrow aspiration. Only bone marrow aspirations are performed in this area.  Long-lasting pain at the site of the bone marrow aspiration and biopsy is uncommon. Your caregiver will let you know when you are to get your results and will discuss them with you. You may make an appointment with your caregiver to find out the results. Do not assume everything is normal if you have not heard from your caregiver or the medical facility. It is important for you to follow up on all of your test results. Document Released: 11/22/2004 Document Revised: 02/11/2012 Document Reviewed: 11/16/2008 [facility] Patient Information 2015 [place], [place]. This information is not intended to replace advice given to you by your health care provider. Make sure you discuss any questions you have with your health care provider. Conscious Sedation Sedation is the use of medicines to promote relaxation and relieve discomfort and anxiety. Conscious sedation is a type of sedation. Under conscious sedation you are less alert than normal but are still able to respond to instructions or stimulation. Conscious sedation is used during short medical and dental procedures. It is milder than deep sedation  or general anesthesia and allows you to return to your regular activities sooner.  LET [facility] CARE PROVIDER KNOW ABOUT:   Any allergies you have.  All medicines you are taking, including vitamins, herbs, eye drops, creams, and over-the-counter  medicines.  Use of steroids (by mouth or creams).  Previous problems you or members of your family have had with the use of anesthetics.  Any blood disorders you have.  Previous surgeries you have had.  Medical conditions you have.  Possibility of pregnancy, if this applies.  Use of cigarettes, alcohol, or illegal drugs. RISKS AND COMPLICATIONS Generally, this is a safe procedure. However, as with any procedure, problems can occur. Possible problems include:  Oversedation.  Trouble breathing on your own. You may need to have a breathing tube until you are awake and breathing on your own.  Allergic reaction to any of the medicines used for the procedure. BEFORE THE PROCEDURE  You may have blood tests done. These tests can help show how well your kidneys and liver are working. They can also show how well your blood clots.  A physical exam will be done.  Only take medicines as directed by your health care provider. You may need to stop taking medicines (such as blood thinners, aspirin, or nonsteroidal anti-inflammatory drugs) before the procedure.   Do not eat or drink at least 6 hours before the procedure or as directed by your health care provider.  Arrange for a responsible adult, family member, or friend to take you home after the procedure. He or she should stay with you for at least 24 hours after the procedure, until the medicine has worn off. PROCEDURE   An intravenous (IV) catheter will be inserted into one of your veins. Medicine will be able to flow directly into your body through this catheter. You may be given medicine through this tube to help prevent pain and help you relax.  The medical or dental procedure will be done. AFTER THE PROCEDURE  You will stay in a recovery area until the medicine has worn off. Your blood pressure and pulse will be checked.   Depending on the procedure you had, you may be allowed to go home when you can tolerate liquids and your  pain is under control. Document Released: 08/14/2001 Document Revised: 11/24/2013 Document Reviewed: 07/27/2013 [facility] Patient Information 2015 [place], [place]. This information is not intended to replace advice given to you by your health care provider. Make sure you discuss any questions you have with your health care provider. Bone Biopsy, Needle, Care After Read the instructions outlined below and refer to this sheet in the next few weeks. These discharge instructions provide you with general information on caring for yourself after you leave the hospital. Your caregiver may also give you specific instructions. While your treatment has been planned according to the most current medical practices available, unavoidable complications sometimes occur. If you have any problems or questions after discharge, call your caregiver. Finding out the results of your test Not all test results are available during your visit. If your test results are not back during the visit, make an appointment with your caregiver to find out the results. Do not assume everything is normal if you have not heard from your caregiver or the medical facility. It is important for you to follow up on all of your test results.  SEEK MEDICAL CARE IF:   You have redness, swelling, or increasing pain at the site of the biopsy.  You  have pus coming from the biopsy site.  You have drainage from the biopsy site lasting longer than 1 day.  You notice a bad smell coming from the biopsy site or dressing.  You develop persistent nausea or vomiting. SEEK IMMEDIATE MEDICAL CARE IF:  You have a fever.  You develop a rash.  You have difficulty breathing.  You develop any reaction or side effects to medicines given. Document Released: 06/08/2005 Document Revised: 09/09/2013 Document Reviewed: 04/26/2009 [facility] Patient Information 2015 [place], [place]. This information is not intended to replace advice given to you by your health  care provider. Make sure you discuss any questions you have with your health care provider.

## 2015-03-17 LAB — CHROMOSOME ANALYSIS, BONE MARROW

## 2015-03-17 LAB — TISSUE HYBRIDIZATION (BONE MARROW)-NCBH

## 2015-03-18 ENCOUNTER — Telehealth: Payer: Self-pay | Admitting: *Deleted

## 2015-03-18 NOTE — Telephone Encounter (Signed)
SPOKE TO DR.MOHAMED'S NURSE, DIANE BELL,RN. PT. NEEDS TO CONTACT HIS PRIMARY CARE PHYSICIAN. NOTIFIED PT. HE VOICES UNDERSTANDING.

## 2015-03-18 NOTE — Telephone Encounter (Signed)
SINCE THE FIRST OF April PT. HAS BEEN TIRED. HE HAS NO ENERGY AND WANTS TO SLEEP ALL THE TIME. PT. IS NOT SHORT OF BREATH. "HE IS NOT HAVING ANY PROBLEMS". PT. "WANTS SOMETHING TO PICK HIM UNTIL HE SEES DR.MOHAMED ON 03/29/15.

## 2015-03-21 DIAGNOSIS — N183 Chronic kidney disease, stage 3 (moderate): Secondary | ICD-10-CM | POA: Diagnosis not present

## 2015-03-21 DIAGNOSIS — R634 Abnormal weight loss: Secondary | ICD-10-CM | POA: Diagnosis not present

## 2015-03-21 DIAGNOSIS — E559 Vitamin D deficiency, unspecified: Secondary | ICD-10-CM | POA: Diagnosis not present

## 2015-03-21 DIAGNOSIS — I10 Essential (primary) hypertension: Secondary | ICD-10-CM | POA: Diagnosis not present

## 2015-03-21 DIAGNOSIS — R5383 Other fatigue: Secondary | ICD-10-CM | POA: Diagnosis not present

## 2015-03-21 DIAGNOSIS — E039 Hypothyroidism, unspecified: Secondary | ICD-10-CM | POA: Diagnosis not present

## 2015-03-21 DIAGNOSIS — F329 Major depressive disorder, single episode, unspecified: Secondary | ICD-10-CM | POA: Diagnosis not present

## 2015-03-21 DIAGNOSIS — R209 Unspecified disturbances of skin sensation: Secondary | ICD-10-CM | POA: Diagnosis not present

## 2015-03-21 DIAGNOSIS — Z1321 Encounter for screening for nutritional disorder: Secondary | ICD-10-CM | POA: Diagnosis not present

## 2015-03-21 DIAGNOSIS — R531 Weakness: Secondary | ICD-10-CM | POA: Diagnosis not present

## 2015-03-23 ENCOUNTER — Encounter (HOSPITAL_COMMUNITY): Payer: Self-pay

## 2015-03-29 ENCOUNTER — Encounter: Payer: Self-pay | Admitting: Internal Medicine

## 2015-03-29 ENCOUNTER — Ambulatory Visit (HOSPITAL_BASED_OUTPATIENT_CLINIC_OR_DEPARTMENT_OTHER): Payer: Medicare Other | Admitting: Internal Medicine

## 2015-03-29 ENCOUNTER — Telehealth: Payer: Self-pay | Admitting: Internal Medicine

## 2015-03-29 VITALS — BP 130/69 | HR 72 | Temp 98.3°F | Resp 16 | Ht 69.0 in | Wt 190.8 lb

## 2015-03-29 DIAGNOSIS — D472 Monoclonal gammopathy: Secondary | ICD-10-CM

## 2015-03-29 DIAGNOSIS — F329 Major depressive disorder, single episode, unspecified: Secondary | ICD-10-CM | POA: Diagnosis not present

## 2015-03-29 DIAGNOSIS — F32A Depression, unspecified: Secondary | ICD-10-CM | POA: Insufficient documentation

## 2015-03-29 NOTE — Progress Notes (Signed)
[place] Telephone:([phone]) [phone]   Fax:(336) [place], [place] [address]  DIAGNOSIS: Monoclonal gammopathy of undetermined significance.  PRIOR THERAPY: None  CURRENT THERAPY: Observation  INTERVAL HISTORY: Lonnie [name] 81 y.o. male returns to the clinic today for follow-up visit. He denied having any significant complaints today. He was recently diagnosed with depression and started by his primary care physician on Lexapro. He has no fever or chills, no nausea or vomiting. The patient denied having any significant weight loss or night sweats. The patient denied having any chest pain, shortness of breath, cough or hemoptysis. He recently had CT guided bone marrow biopsy and aspirate performed and the patient is here today for evaluation and discussion of his biopsy results and recommendation regarding treatment of his condition.  MEDICAL HISTORY: Past Medical History  Diagnosis Date  . Adenomatous polyps   . GERD (gastroesophageal reflux disease)   . Hypothyroidism   . Hypertension   . Frequent urination at night     ALLERGIES:  has No Known Allergies.  MEDICATIONS:  Current Outpatient Prescriptions  Medication Sig Dispense Refill  . amLODipine (NORVASC) 5 MG tablet Take 1 tablet by mouth at bedtime.     . clotrimazole (LOTRIMIN) 1 % cream Apply 1 application topically 2 (two) times daily. Use of 7-10 days. 30 g 0  . escitalopram (LEXAPRO) 5 MG tablet Take 1 tablet by mouth at bedtime.     . fluticasone (FLONASE) 50 MCG/ACT nasal spray Place 2 sprays into the nose daily as needed for allergies or rhinitis.     [name] glyBURIDE (DIABETA) 5 MG tablet Take 5 mg by mouth at bedtime.    [name] levothyroxine (SYNTHROID, LEVOTHROID) 88 MCG tablet Take 88 mcg by mouth at bedtime.    . mirabegron ER (MYRBETRIQ) 50 MG TB24 tablet Take 50 mg by mouth at bedtime.    . polyethylene glycol (MIRALAX / GLYCOLAX)  packet Take 17 g by mouth every evening.     . polyvinyl alcohol (LIQUIFILM TEARS) 1.4 % ophthalmic solution Place 1-2 drops into both eyes as needed for dry eyes.    [name] psyllium (METAMUCIL) 58.6 % powder Take 1 packet by mouth at bedtime. Tablespoon and a half.    . ranitidine (ZANTAC) 150 MG capsule Take 150 mg by mouth 2 (two) times daily.     No current facility-administered medications for this visit.    SURGICAL HISTORY:  Past Surgical History  Procedure Laterality Date  . Skin surgery      back    REVIEW OF SYSTEMS:  A comprehensive review of systems was negative except for: Constitutional: positive for fatigue   PHYSICAL EXAMINATION: General appearance: alert, cooperative, fatigued and no distress Head: Normocephalic, without obvious abnormality, atraumatic Neck: no adenopathy, no JVD, supple, symmetrical, trachea midline and thyroid not enlarged, symmetric, no tenderness/mass/nodules Lymph nodes: Cervical, supraclavicular, and axillary nodes normal. Resp: clear to auscultation bilaterally Back: symmetric, no curvature. ROM normal. No CVA tenderness. Cardio: regular rate and rhythm, S1, S2 normal, no murmur, click, rub or gallop GI: soft, non-tender; bowel sounds normal; no masses,  no organomegaly Extremities: extremities normal, atraumatic, no cyanosis or edema  ECOG PERFORMANCE STATUS: 1 - Symptomatic but completely ambulatory  Blood pressure 130/69, pulse 72, temperature 98.3 F (36.8 C), temperature source Oral, resp. rate 16, height '5\' 9"'  (1.753 m), weight 190 lb 12.8 oz (86.546 kg), SpO2 97 %.  LABORATORY  DATA: Lab Results  Component Value Date   WBC 7.0 03/09/2015   HGB 11.6* 03/09/2015   HCT 35.0* 03/09/2015   MCV 91.9 03/09/2015   PLT 250 03/09/2015      Chemistry      Component Value Date/Time   NA 140 02/21/2015 0846   K 4.8 02/21/2015 0846   CO2 22 02/21/2015 0846   BUN 48.8* 02/21/2015 0846   CREATININE 2.6* 02/21/2015 0846      Component Value  Date/Time   CALCIUM 8.9 02/21/2015 0846   ALKPHOS 107 02/21/2015 0846   AST 19 02/21/2015 0846   ALT 13 02/21/2015 0846   BILITOT 0.30 02/21/2015 0846     Myeloma panel: Beta-2 microglobulin 8.26, free kappa light chain 14.70, free lambda light chain 6.06 with a kappa/lambda ratio 2.43. IgG 1430, IgA 308 and IgM 72  RADIOGRAPHIC STUDIES: Ct Biopsy  03/09/2015   CLINICAL DATA:  81 year old with the monoclonal gammopathy.  EXAM: CT GUIDED BONE MARROW ASPIRATES AND BIOPSY  Physician: Stephan Minister. Henn, MD  MEDICATIONS: 2 mg Versed, 150 mcg fentanyl. A radiology nurse monitored the patient for moderate sedation.  ANESTHESIA/SEDATION: Sedation time: 14 minutes  PROCEDURE: The procedure was explained to the patient. The risks and benefits of the procedure were discussed and the patient's questions were addressed. Informed consent was obtained from the patient. The patient was placed prone on CT scan. Images of the pelvis were obtained. The right side of back was prepped and draped in sterile fashion. The skin and right posterior iliac bone were anesthetized with 1% lidocaine. 11 gauge bone needle was directed into the right iliac bone with CT guidance. Two aspirates and two core biopsies obtained. Bandage placed over the puncture site.  FINDINGS: Bone needle directed into the posterior right ilium.  Estimated blood loss: Minimal  COMPLICATIONS: None  IMPRESSION: CT guided bone marrow aspirates and core biopsy.   Electronically Signed   By: Markus Daft M.D.   On: 03/09/2015 11:02   Patient: [name], [name] Collected: 03/09/2015 Client: [facility] Accession: [id number] Received: 03/09/2015 Markus Daft DOB: 08/03/1925 Age: 61 Gender: M Reported: 03/11/2015 [address] Patient Ph: [phone] MRN #: [id number] [place], [place] [address] Visit #: [id number] Chart #: Phone: [phone] Fax: CC: Curt Bears, MD BONE MARROW REPORT FINAL DIAGNOSIS Diagnosis Bone Marrow, Aspirate,Biopsy, and Clot,  right ilium - HYPERCELLULAR BONE MARROW FOR AGE WITH TRILINEAGE HEMATOPOIESIS AND 3% PLASMA CELLS. - SEE COMMENT. PERIPHERAL BLOOD: - NORMOCYTIC-NORMOCHROMIC ANEMIA. Diagnosis Note The bone marrow is slightly hypercellular for age with trilineage hematopoiesis and essentially orderly and progressive maturation of all cell types. Significant dyspoiesis is not seen. The plasma cells represent 3% of all cells with lack of large aggregates or sheets. Immunohistochemical stains show a polyclonal staining for kappa and lambda light chains in plasma cells. The findings are not considered specific or diagnostic of plasma cell dyscrasia/neoplasm. Correlation with cytogenetic studies is recommended. (BNS:kh:ecj 03-11-15) Susanne Greenhouse MD Pathologist, Electronic Signature (Case signed 03/11/2015)   ASSESSMENT AND PLAN: This is a very pleasant 81 years old white male with monoclonal gammopathy of undetermined significance. The patient is doing fine today with no specific complaints. The recent myeloma workup showed is concerning for disease progression with increase in the free kappa light chain.  He had a recent bone marrow biopsy and aspirate that showed 3% plasma cells and a 24 hour urine protein electrophoreses showed no significant monoclonal bands. These findings are reassuring that the patient has no concerning  evidence for multiple myeloma. I discussed the lab and biopsy result with the patient today.  I recommended for him to continue on observation with repeat myeloma panel in 6 months. For depression, the patient will continue on Lexapro as prescribed by his primary care physician. The patient was advised to call immediately if he has any concerning symptoms in the interval. The patient voices understanding of current disease status and treatment options and is in agreement with the current care plan.  All questions were answered. The patient knows to call the clinic with any problems, questions  or concerns. We can certainly see the patient much sooner if necessary.  Disclaimer: This note was dictated with voice recognition software. Similar sounding words can inadvertently be transcribed and may not be corrected upon review.

## 2015-03-29 NOTE — Telephone Encounter (Signed)
Gave avs & calendar for October/November °

## 2015-04-11 DIAGNOSIS — D649 Anemia, unspecified: Secondary | ICD-10-CM | POA: Diagnosis not present

## 2015-04-11 DIAGNOSIS — F329 Major depressive disorder, single episode, unspecified: Secondary | ICD-10-CM | POA: Diagnosis not present

## 2015-04-11 DIAGNOSIS — N183 Chronic kidney disease, stage 3 (moderate): Secondary | ICD-10-CM | POA: Diagnosis not present

## 2015-04-11 DIAGNOSIS — Z6828 Body mass index (BMI) 28.0-28.9, adult: Secondary | ICD-10-CM | POA: Diagnosis not present

## 2015-04-11 DIAGNOSIS — B369 Superficial mycosis, unspecified: Secondary | ICD-10-CM | POA: Diagnosis not present

## 2015-04-11 DIAGNOSIS — I10 Essential (primary) hypertension: Secondary | ICD-10-CM | POA: Diagnosis not present

## 2015-05-13 DIAGNOSIS — D472 Monoclonal gammopathy: Secondary | ICD-10-CM | POA: Diagnosis not present

## 2015-05-13 DIAGNOSIS — N184 Chronic kidney disease, stage 4 (severe): Secondary | ICD-10-CM | POA: Diagnosis not present

## 2015-05-13 DIAGNOSIS — R531 Weakness: Secondary | ICD-10-CM | POA: Diagnosis not present

## 2015-05-13 DIAGNOSIS — D649 Anemia, unspecified: Secondary | ICD-10-CM | POA: Diagnosis not present

## 2015-05-13 DIAGNOSIS — F329 Major depressive disorder, single episode, unspecified: Secondary | ICD-10-CM | POA: Diagnosis not present

## 2015-05-13 DIAGNOSIS — Z6828 Body mass index (BMI) 28.0-28.9, adult: Secondary | ICD-10-CM | POA: Diagnosis not present

## 2015-05-13 DIAGNOSIS — E039 Hypothyroidism, unspecified: Secondary | ICD-10-CM | POA: Diagnosis not present

## 2015-05-13 DIAGNOSIS — I10 Essential (primary) hypertension: Secondary | ICD-10-CM | POA: Diagnosis not present

## 2015-05-31 DIAGNOSIS — M25552 Pain in left hip: Secondary | ICD-10-CM | POA: Diagnosis not present

## 2015-06-16 DIAGNOSIS — N3281 Overactive bladder: Secondary | ICD-10-CM | POA: Diagnosis not present

## 2015-06-16 DIAGNOSIS — N289 Disorder of kidney and ureter, unspecified: Secondary | ICD-10-CM | POA: Diagnosis not present

## 2015-06-16 DIAGNOSIS — N401 Enlarged prostate with lower urinary tract symptoms: Secondary | ICD-10-CM | POA: Diagnosis not present

## 2015-06-16 DIAGNOSIS — R351 Nocturia: Secondary | ICD-10-CM | POA: Diagnosis not present

## 2015-06-16 DIAGNOSIS — R350 Frequency of micturition: Secondary | ICD-10-CM | POA: Diagnosis not present

## 2015-06-21 DIAGNOSIS — R278 Other lack of coordination: Secondary | ICD-10-CM | POA: Diagnosis not present

## 2015-06-21 DIAGNOSIS — N3281 Overactive bladder: Secondary | ICD-10-CM | POA: Diagnosis not present

## 2015-06-21 DIAGNOSIS — M62838 Other muscle spasm: Secondary | ICD-10-CM | POA: Diagnosis not present

## 2015-06-21 DIAGNOSIS — M6281 Muscle weakness (generalized): Secondary | ICD-10-CM | POA: Diagnosis not present

## 2015-06-30 DIAGNOSIS — N3281 Overactive bladder: Secondary | ICD-10-CM | POA: Diagnosis not present

## 2015-06-30 DIAGNOSIS — M6281 Muscle weakness (generalized): Secondary | ICD-10-CM | POA: Diagnosis not present

## 2015-06-30 DIAGNOSIS — R278 Other lack of coordination: Secondary | ICD-10-CM | POA: Diagnosis not present

## 2015-06-30 DIAGNOSIS — M62838 Other muscle spasm: Secondary | ICD-10-CM | POA: Diagnosis not present

## 2015-07-11 DIAGNOSIS — R278 Other lack of coordination: Secondary | ICD-10-CM | POA: Diagnosis not present

## 2015-07-11 DIAGNOSIS — M6281 Muscle weakness (generalized): Secondary | ICD-10-CM | POA: Diagnosis not present

## 2015-07-11 DIAGNOSIS — N3281 Overactive bladder: Secondary | ICD-10-CM | POA: Diagnosis not present

## 2015-07-11 DIAGNOSIS — M62838 Other muscle spasm: Secondary | ICD-10-CM | POA: Diagnosis not present

## 2015-07-18 DIAGNOSIS — M62838 Other muscle spasm: Secondary | ICD-10-CM | POA: Diagnosis not present

## 2015-07-18 DIAGNOSIS — N3281 Overactive bladder: Secondary | ICD-10-CM | POA: Diagnosis not present

## 2015-07-18 DIAGNOSIS — M6281 Muscle weakness (generalized): Secondary | ICD-10-CM | POA: Diagnosis not present

## 2015-07-18 DIAGNOSIS — R278 Other lack of coordination: Secondary | ICD-10-CM | POA: Diagnosis not present

## 2015-07-21 DIAGNOSIS — I10 Essential (primary) hypertension: Secondary | ICD-10-CM | POA: Diagnosis not present

## 2015-07-21 DIAGNOSIS — D472 Monoclonal gammopathy: Secondary | ICD-10-CM | POA: Diagnosis not present

## 2015-07-21 DIAGNOSIS — F329 Major depressive disorder, single episode, unspecified: Secondary | ICD-10-CM | POA: Diagnosis not present

## 2015-07-21 DIAGNOSIS — D649 Anemia, unspecified: Secondary | ICD-10-CM | POA: Diagnosis not present

## 2015-07-21 DIAGNOSIS — N184 Chronic kidney disease, stage 4 (severe): Secondary | ICD-10-CM | POA: Diagnosis not present

## 2015-07-21 DIAGNOSIS — E039 Hypothyroidism, unspecified: Secondary | ICD-10-CM | POA: Diagnosis not present

## 2015-07-21 DIAGNOSIS — E559 Vitamin D deficiency, unspecified: Secondary | ICD-10-CM | POA: Diagnosis not present

## 2015-07-21 DIAGNOSIS — Z6827 Body mass index (BMI) 27.0-27.9, adult: Secondary | ICD-10-CM | POA: Diagnosis not present

## 2015-08-01 DIAGNOSIS — R278 Other lack of coordination: Secondary | ICD-10-CM | POA: Diagnosis not present

## 2015-08-01 DIAGNOSIS — N3281 Overactive bladder: Secondary | ICD-10-CM | POA: Diagnosis not present

## 2015-08-01 DIAGNOSIS — M6281 Muscle weakness (generalized): Secondary | ICD-10-CM | POA: Diagnosis not present

## 2015-08-01 DIAGNOSIS — M62838 Other muscle spasm: Secondary | ICD-10-CM | POA: Diagnosis not present

## 2015-08-04 DIAGNOSIS — R350 Frequency of micturition: Secondary | ICD-10-CM | POA: Diagnosis not present

## 2015-08-04 DIAGNOSIS — N3281 Overactive bladder: Secondary | ICD-10-CM | POA: Diagnosis not present

## 2015-08-04 DIAGNOSIS — N289 Disorder of kidney and ureter, unspecified: Secondary | ICD-10-CM | POA: Diagnosis not present

## 2015-08-04 DIAGNOSIS — R351 Nocturia: Secondary | ICD-10-CM | POA: Diagnosis not present

## 2015-08-10 DIAGNOSIS — N3281 Overactive bladder: Secondary | ICD-10-CM | POA: Diagnosis not present

## 2015-08-10 DIAGNOSIS — R278 Other lack of coordination: Secondary | ICD-10-CM | POA: Diagnosis not present

## 2015-08-10 DIAGNOSIS — M6281 Muscle weakness (generalized): Secondary | ICD-10-CM | POA: Diagnosis not present

## 2015-08-10 DIAGNOSIS — M62838 Other muscle spasm: Secondary | ICD-10-CM | POA: Diagnosis not present

## 2015-08-23 DIAGNOSIS — N138 Other obstructive and reflux uropathy: Secondary | ICD-10-CM | POA: Diagnosis not present

## 2015-08-23 DIAGNOSIS — H47292 Other optic atrophy, left eye: Secondary | ICD-10-CM | POA: Diagnosis not present

## 2015-08-23 DIAGNOSIS — N401 Enlarged prostate with lower urinary tract symptoms: Secondary | ICD-10-CM | POA: Diagnosis not present

## 2015-08-23 DIAGNOSIS — R350 Frequency of micturition: Secondary | ICD-10-CM | POA: Diagnosis not present

## 2015-08-29 DIAGNOSIS — R32 Unspecified urinary incontinence: Secondary | ICD-10-CM | POA: Diagnosis not present

## 2015-08-29 DIAGNOSIS — M6281 Muscle weakness (generalized): Secondary | ICD-10-CM | POA: Diagnosis not present

## 2015-08-29 DIAGNOSIS — M62838 Other muscle spasm: Secondary | ICD-10-CM | POA: Diagnosis not present

## 2015-08-29 DIAGNOSIS — R278 Other lack of coordination: Secondary | ICD-10-CM | POA: Diagnosis not present

## 2015-09-05 DIAGNOSIS — R278 Other lack of coordination: Secondary | ICD-10-CM | POA: Diagnosis not present

## 2015-09-05 DIAGNOSIS — N138 Other obstructive and reflux uropathy: Secondary | ICD-10-CM | POA: Diagnosis not present

## 2015-09-05 DIAGNOSIS — N401 Enlarged prostate with lower urinary tract symptoms: Secondary | ICD-10-CM | POA: Diagnosis not present

## 2015-09-05 DIAGNOSIS — R350 Frequency of micturition: Secondary | ICD-10-CM | POA: Diagnosis not present

## 2015-09-05 DIAGNOSIS — M6281 Muscle weakness (generalized): Secondary | ICD-10-CM | POA: Diagnosis not present

## 2015-09-05 DIAGNOSIS — M62838 Other muscle spasm: Secondary | ICD-10-CM | POA: Diagnosis not present

## 2015-09-14 DIAGNOSIS — R278 Other lack of coordination: Secondary | ICD-10-CM | POA: Diagnosis not present

## 2015-09-14 DIAGNOSIS — M62838 Other muscle spasm: Secondary | ICD-10-CM | POA: Diagnosis not present

## 2015-09-14 DIAGNOSIS — M6281 Muscle weakness (generalized): Secondary | ICD-10-CM | POA: Diagnosis not present

## 2015-09-14 DIAGNOSIS — R32 Unspecified urinary incontinence: Secondary | ICD-10-CM | POA: Diagnosis not present

## 2015-09-14 DIAGNOSIS — R351 Nocturia: Secondary | ICD-10-CM | POA: Diagnosis not present

## 2015-09-27 ENCOUNTER — Other Ambulatory Visit (HOSPITAL_BASED_OUTPATIENT_CLINIC_OR_DEPARTMENT_OTHER): Payer: Medicare Other

## 2015-09-27 DIAGNOSIS — F329 Major depressive disorder, single episode, unspecified: Secondary | ICD-10-CM

## 2015-09-27 DIAGNOSIS — D472 Monoclonal gammopathy: Secondary | ICD-10-CM | POA: Diagnosis not present

## 2015-09-27 DIAGNOSIS — F32A Depression, unspecified: Secondary | ICD-10-CM

## 2015-09-27 DIAGNOSIS — D631 Anemia in chronic kidney disease: Secondary | ICD-10-CM | POA: Diagnosis not present

## 2015-09-27 DIAGNOSIS — N184 Chronic kidney disease, stage 4 (severe): Secondary | ICD-10-CM | POA: Diagnosis not present

## 2015-09-27 DIAGNOSIS — E039 Hypothyroidism, unspecified: Secondary | ICD-10-CM | POA: Diagnosis not present

## 2015-09-27 DIAGNOSIS — N2581 Secondary hyperparathyroidism of renal origin: Secondary | ICD-10-CM | POA: Diagnosis not present

## 2015-09-27 LAB — CBC WITH DIFFERENTIAL/PLATELET
BASO%: 0.8 % (ref 0.0–2.0)
BASOS ABS: 0 10*3/uL (ref 0.0–0.1)
EOS%: 4 % (ref 0.0–7.0)
Eosinophils Absolute: 0.2 10*3/uL (ref 0.0–0.5)
HEMATOCRIT: 33.5 % — AB (ref 38.4–49.9)
HEMOGLOBIN: 11.1 g/dL — AB (ref 13.0–17.1)
LYMPH#: 0.9 10*3/uL (ref 0.9–3.3)
LYMPH%: 14.7 % (ref 14.0–49.0)
MCH: 30.5 pg (ref 27.2–33.4)
MCHC: 33 g/dL (ref 32.0–36.0)
MCV: 92.2 fL (ref 79.3–98.0)
MONO#: 0.5 10*3/uL (ref 0.1–0.9)
MONO%: 8.4 % (ref 0.0–14.0)
NEUT%: 72.1 % (ref 39.0–75.0)
NEUTROS ABS: 4.3 10*3/uL (ref 1.5–6.5)
Platelets: 232 10*3/uL (ref 140–400)
RBC: 3.63 10*6/uL — ABNORMAL LOW (ref 4.20–5.82)
RDW: 12.8 % (ref 11.0–14.6)
WBC: 5.9 10*3/uL (ref 4.0–10.3)

## 2015-09-27 LAB — COMPREHENSIVE METABOLIC PANEL (CC13)
ALBUMIN: 3.7 g/dL (ref 3.5–5.0)
ALT: 12 U/L (ref 0–55)
AST: 18 U/L (ref 5–34)
Alkaline Phosphatase: 128 U/L (ref 40–150)
Anion Gap: 7 mEq/L (ref 3–11)
BILIRUBIN TOTAL: 0.32 mg/dL (ref 0.20–1.20)
BUN: 48 mg/dL — AB (ref 7.0–26.0)
CALCIUM: 9.5 mg/dL (ref 8.4–10.4)
CO2: 22 mEq/L (ref 22–29)
CREATININE: 2.8 mg/dL — AB (ref 0.7–1.3)
Chloride: 107 mEq/L (ref 98–109)
EGFR: 19 mL/min/{1.73_m2} — ABNORMAL LOW (ref >90)
Glucose: 102 mg/dl (ref 70–140)
POTASSIUM: 5.2 meq/L — AB (ref 3.5–5.1)
SODIUM: 136 meq/L (ref 136–145)
TOTAL PROTEIN: 7.3 g/dL (ref 6.4–8.3)

## 2015-09-27 LAB — LACTATE DEHYDROGENASE (CC13): LDH: 177 U/L (ref 125–245)

## 2015-09-28 ENCOUNTER — Other Ambulatory Visit (HOSPITAL_COMMUNITY): Payer: Self-pay | Admitting: Nephrology

## 2015-09-28 DIAGNOSIS — R351 Nocturia: Secondary | ICD-10-CM | POA: Diagnosis not present

## 2015-09-28 DIAGNOSIS — M62838 Other muscle spasm: Secondary | ICD-10-CM | POA: Diagnosis not present

## 2015-09-28 DIAGNOSIS — R278 Other lack of coordination: Secondary | ICD-10-CM | POA: Diagnosis not present

## 2015-09-28 DIAGNOSIS — N3281 Overactive bladder: Secondary | ICD-10-CM | POA: Diagnosis not present

## 2015-09-28 DIAGNOSIS — D472 Monoclonal gammopathy: Secondary | ICD-10-CM

## 2015-09-28 DIAGNOSIS — M6281 Muscle weakness (generalized): Secondary | ICD-10-CM | POA: Diagnosis not present

## 2015-09-29 LAB — IGG, IGA, IGM
IGG (IMMUNOGLOBIN G), SERUM: 1350 mg/dL (ref 650–1600)
IgA: 310 mg/dL (ref 68–379)
IgM, Serum: 74 mg/dL (ref 41–251)

## 2015-09-29 LAB — KAPPA/LAMBDA LIGHT CHAINS
KAPPA LAMBDA RATIO: 1.96 — AB (ref 0.26–1.65)
Kappa free light chain: 14.4 mg/dL — ABNORMAL HIGH (ref 0.33–1.94)
LAMBDA FREE LGHT CHN: 7.35 mg/dL — AB (ref 0.57–2.63)

## 2015-09-29 LAB — BETA 2 MICROGLOBULIN, SERUM: BETA 2 MICROGLOBULIN: 8.91 mg/L — AB (ref <=2.51)

## 2015-10-04 ENCOUNTER — Telehealth: Payer: Self-pay | Admitting: Internal Medicine

## 2015-10-04 ENCOUNTER — Ambulatory Visit (HOSPITAL_BASED_OUTPATIENT_CLINIC_OR_DEPARTMENT_OTHER): Payer: Medicare Other | Admitting: Internal Medicine

## 2015-10-04 ENCOUNTER — Encounter: Payer: Self-pay | Admitting: Internal Medicine

## 2015-10-04 DIAGNOSIS — D649 Anemia, unspecified: Secondary | ICD-10-CM

## 2015-10-04 DIAGNOSIS — D890 Polyclonal hypergammaglobulinemia: Secondary | ICD-10-CM

## 2015-10-04 DIAGNOSIS — Z23 Encounter for immunization: Secondary | ICD-10-CM | POA: Diagnosis not present

## 2015-10-04 DIAGNOSIS — R5383 Other fatigue: Secondary | ICD-10-CM

## 2015-10-04 DIAGNOSIS — N289 Disorder of kidney and ureter, unspecified: Secondary | ICD-10-CM

## 2015-10-04 HISTORY — DX: Polyclonal hypergammaglobulinemia: D89.0

## 2015-10-04 NOTE — Telephone Encounter (Signed)
Gave and printed appt sched and avs fo rpt for May 2017 °

## 2015-10-04 NOTE — Progress Notes (Signed)
[facility] [phone]   [phone] [place], [place] [address]  DIAGNOSIS: Polyclonal gammopathy of undetermined significance.  PRIOR THERAPY: None  CURRENT THERAPY: Observation  INTERVAL HISTORY: Lonnie [name] 81 y.o. male returns to the clinic today for follow-up visit. He denied having any significant complaints today. He has no fever or chills, no nausea or vomiting. The patient denied having any significant weight loss or night sweats. The patient denied having any chest pain, shortness of breath, cough or hemoptysis. His last bone marrow biopsy and aspirate showed 3% plasma cells with polyclonal staining for kappa and lambda light chains which is not specific and no diagnostic for plasma cell dyscrasia. The patient was seen recently by his nephrologist Dr. Justin Mend and he is considering him for kidney biopsy for evaluation of his renal insufficiency. The patient had repeat myeloma panel performed recently and he is here for evaluation and discussion of his lab results.  MEDICAL HISTORY: Past Medical History  Diagnosis Date  . Adenomatous polyps   . GERD (gastroesophageal reflux disease)   . Hypothyroidism   . Hypertension   . Frequent urination at night     ALLERGIES:  has No Known Allergies.  MEDICATIONS:  Current Outpatient Prescriptions  Medication Sig Dispense Refill  . amLODipine (NORVASC) 5 MG tablet Take 1 tablet by mouth at bedtime.     [name] buPROPion (WELLBUTRIN) 75 MG tablet Take 75 mg by mouth daily.    . clotrimazole (LOTRIMIN) 1 % cream Apply 1 application topically 2 (two) times daily. Use of 7-10 days. 30 g 0  . escitalopram (LEXAPRO) 5 MG tablet Take 1 tablet by mouth at bedtime.     . fluticasone (FLONASE) 50 MCG/ACT nasal spray Place 2 sprays into the nose daily as needed for allergies or rhinitis.     [name] glyBURIDE (DIABETA) 5 MG tablet Take 5 mg by mouth at  bedtime.    [name] levothyroxine (SYNTHROID, LEVOTHROID) 88 MCG tablet Take 88 mcg by mouth at bedtime.    . mirabegron ER (MYRBETRIQ) 50 MG TB24 tablet Take 50 mg by mouth at bedtime.    . polyethylene glycol (MIRALAX / GLYCOLAX) packet Take 17 g by mouth every evening.     . polyvinyl alcohol (LIQUIFILM TEARS) 1.4 % ophthalmic solution Place 1-2 drops into both eyes as needed for dry eyes.    [name] psyllium (METAMUCIL) 58.6 % powder Take 1 packet by mouth at bedtime. Tablespoon and a half.    . ranitidine (ZANTAC) 150 MG capsule Take 150 mg by mouth 2 (two) times daily.    . finasteride (PROSCAR) 5 MG tablet Take 5 mg by mouth daily.     No current facility-administered medications for this visit.    SURGICAL HISTORY:  Past Surgical History  Procedure Laterality Date  . Skin surgery      back    REVIEW OF SYSTEMS:  A comprehensive review of systems was negative except for: Constitutional: positive for fatigue   PHYSICAL EXAMINATION: General appearance: alert, cooperative, fatigued and no distress Head: Normocephalic, without obvious abnormality, atraumatic Neck: no adenopathy, no JVD, supple, symmetrical, trachea midline and thyroid not enlarged, symmetric, no tenderness/mass/nodules Lymph nodes: Cervical, supraclavicular, and axillary nodes normal. Resp: clear to auscultation bilaterally Back: symmetric, no curvature. ROM normal. No CVA tenderness. Cardio: regular rate and rhythm, S1, S2 normal, no murmur, click, rub or gallop GI: soft, non-tender; bowel sounds  normal; no masses,  no organomegaly Extremities: extremities normal, atraumatic, no cyanosis or edema  ECOG PERFORMANCE STATUS: 1 - Symptomatic but completely ambulatory  Blood pressure 141/79, pulse 78, temperature 96.9 F (36.1 C), resp. rate 18, height '5\' 9"'  (1.753 m), weight 177 lb 12.8 oz (80.65 kg), SpO2 98 %.  LABORATORY DATA: Lab Results  Component Value Date   WBC 5.9 09/27/2015   HGB 11.1* 09/27/2015   HCT 33.5*  09/27/2015   MCV 92.2 09/27/2015   PLT 232 09/27/2015      Chemistry      Component Value Date/Time   NA 136 09/27/2015 1130   K 5.2* 09/27/2015 1130   CO2 22 09/27/2015 1130   BUN 48.0* 09/27/2015 1130   CREATININE 2.8* 09/27/2015 1130      Component Value Date/Time   CALCIUM 9.5 09/27/2015 1130   ALKPHOS 128 09/27/2015 1130   AST 18 09/27/2015 1130   ALT 12 09/27/2015 1130   BILITOT 0.32 09/27/2015 1130     Myeloma panel: Beta-2 microglobulin 8.91, free kappa light chain 14.40, free lambda light chain 7.35 with a kappa/lambda ratio 1.96. IgG 1350, IgA 310 and IgM 74  RADIOGRAPHIC STUDIES: No results found.  ASSESSMENT AND PLAN: This is a very pleasant 81 years old white male with polyclonal gammopathy.  His previous bone marrow biopsy and aspirate that showed 3% plasma cells and a 24 hour urine protein electrophoreses showed no significant monoclonal bands. These findings are reassuring that the patient has no concerning evidence for multiple myeloma. The recent myeloma panel showed no evidence for disease progression. The patient has a scheduled for a kidney biopsy for evaluation of his renal dysfunction. It showed a myeloma kidney I would consider the patient for treatment with subcutaneous Velcade and Decadron otherwise he will continue on observation. I would see him back for follow-up visit with repeat myeloma panel in 6 months. For the fatigue and mild anemia, I advised the patient to take over-the-counter oral iron tablets with vitamin C. The patient was advised to call immediately if he has any concerning symptoms in the interval. The patient voices understanding of current disease status and treatment options and is in agreement with the current care plan.  All questions were answered. The patient knows to call the clinic with any problems, questions or concerns. We can certainly see the patient much sooner if necessary.  Disclaimer: This note was dictated with voice  recognition software. Similar sounding words can inadvertently be transcribed and may not be corrected upon review.

## 2015-10-05 ENCOUNTER — Other Ambulatory Visit: Payer: Self-pay | Admitting: Radiology

## 2015-10-06 ENCOUNTER — Encounter (HOSPITAL_COMMUNITY): Payer: Self-pay | Admitting: General Practice

## 2015-10-06 ENCOUNTER — Encounter (HOSPITAL_COMMUNITY): Payer: Self-pay

## 2015-10-06 ENCOUNTER — Observation Stay (HOSPITAL_COMMUNITY)
Admission: AD | Admit: 2015-10-06 | Discharge: 2015-10-07 | Disposition: A | Discharge status: Home / Self Care | Payer: Medicare Other | Source: Ambulatory Visit | Attending: Diagnostic Radiology | Admitting: Diagnostic Radiology

## 2015-10-06 ENCOUNTER — Ambulatory Visit (HOSPITAL_COMMUNITY)
Admission: RE | Admit: 2015-10-06 | Discharge: 2015-10-06 | Disposition: A | Discharge status: Home / Self Care | Payer: Medicare Other | Source: Ambulatory Visit | Attending: Nephrology | Admitting: Nephrology

## 2015-10-06 DIAGNOSIS — Z79899 Other long term (current) drug therapy: Secondary | ICD-10-CM | POA: Diagnosis not present

## 2015-10-06 DIAGNOSIS — E039 Hypothyroidism, unspecified: Secondary | ICD-10-CM | POA: Diagnosis not present

## 2015-10-06 DIAGNOSIS — D472 Monoclonal gammopathy: Secondary | ICD-10-CM

## 2015-10-06 DIAGNOSIS — I10 Essential (primary) hypertension: Secondary | ICD-10-CM | POA: Insufficient documentation

## 2015-10-06 DIAGNOSIS — Z87891 Personal history of nicotine dependence: Secondary | ICD-10-CM | POA: Insufficient documentation

## 2015-10-06 DIAGNOSIS — D890 Polyclonal hypergammaglobulinemia: Secondary | ICD-10-CM | POA: Diagnosis not present

## 2015-10-06 HISTORY — PX: RENAL BIOPSY: SHX156

## 2015-10-06 LAB — CBC
HEMATOCRIT: 32.4 % — AB (ref 39.0–52.0)
HEMATOCRIT: 30.2 % — AB (ref 39.0–52.0)
Hemoglobin: 10.7 g/dL — ABNORMAL LOW (ref 13.0–17.0)
Hemoglobin: 10 g/dL — ABNORMAL LOW (ref 13.0–17.0)
MCH: 30.2 pg (ref 26.0–34.0)
MCH: 30.2 pg (ref 26.0–34.0)
MCHC: 33 g/dL (ref 30.0–36.0)
MCHC: 33.1 g/dL (ref 30.0–36.0)
MCV: 91.5 fL (ref 78.0–100.0)
MCV: 91.2 fL (ref 78.0–100.0)
PLATELETS: 204 10*3/uL (ref 150–400)
PLATELETS: 196 10*3/uL (ref 150–400)
RBC: 3.54 MIL/uL — ABNORMAL LOW (ref 4.22–5.81)
RBC: 3.31 MIL/uL — ABNORMAL LOW (ref 4.22–5.81)
RDW: 12.7 % (ref 11.5–15.5)
RDW: 12.6 % (ref 11.5–15.5)
WBC: 6.3 10*3/uL (ref 4.0–10.5)
WBC: 6.1 10*3/uL (ref 4.0–10.5)

## 2015-10-06 LAB — APTT: aPTT: 30 seconds (ref 24–37)

## 2015-10-06 LAB — PROTIME-INR
INR: 1.14 (ref 0.00–1.49)
Prothrombin Time: 14.8 seconds (ref 11.6–15.2)

## 2015-10-06 MED ORDER — FINASTERIDE 5 MG PO TABS
5.0000 mg | ORAL_TABLET | Freq: Every day | ORAL | Status: DC
  Administered 2015-10-06 – 2015-10-07 (×2): 5 mg via ORAL
  Filled 2015-10-06: qty 1

## 2015-10-06 MED ORDER — FENTANYL CITRATE (PF) 100 MCG/2ML IJ SOLN
INTRAMUSCULAR | Status: AC
  Filled 2015-10-06: qty 2

## 2015-10-06 MED ORDER — ACETAMINOPHEN 325 MG PO TABS
650.0000 mg | ORAL_TABLET | Freq: Four times a day (QID) | ORAL | Status: DC | PRN
  Filled 2015-10-06: qty 2

## 2015-10-06 MED ORDER — ALUM & MAG HYDROXIDE-SIMETH 400-400-40 MG/5ML PO SUSP: 30.0000 mL | Freq: Four times a day (QID) | ORAL | Status: DC | PRN

## 2015-10-06 MED ORDER — ACETAMINOPHEN 650 MG RE SUPP: 650.0000 mg | Freq: Four times a day (QID) | RECTAL | Status: DC | PRN

## 2015-10-06 MED ORDER — BUPROPION HCL 75 MG PO TABS
75.0000 mg | ORAL_TABLET | Freq: Every day | ORAL | Status: DC
  Administered 2015-10-06 – 2015-10-07 (×2): 75 mg via ORAL
  Filled 2015-10-06 (×3): qty 1

## 2015-10-06 MED ORDER — LEVOTHYROXINE SODIUM 88 MCG PO TABS
88.0000 ug | ORAL_TABLET | Freq: Every day | ORAL | Status: DC
  Administered 2015-10-06: 88 ug via ORAL
  Filled 2015-10-06: qty 1

## 2015-10-06 MED ORDER — MIDAZOLAM HCL 2 MG/2ML IJ SOLN
INTRAMUSCULAR | Status: AC
  Filled 2015-10-06: qty 2

## 2015-10-06 MED ORDER — LIDOCAINE HCL (PF) 1 % IJ SOLN
INTRAMUSCULAR | Status: AC
  Filled 2015-10-06: qty 10

## 2015-10-06 MED ORDER — AMLODIPINE BESYLATE 5 MG PO TABS
5.0000 mg | ORAL_TABLET | Freq: Every day | ORAL | Status: DC
  Administered 2015-10-06: 5 mg via ORAL
  Filled 2015-10-06: qty 1

## 2015-10-06 MED ORDER — POLYVINYL ALCOHOL 1.4 % OP SOLN: 1.0000 [drp] | OPHTHALMIC | Status: DC | PRN

## 2015-10-06 MED ORDER — FENTANYL CITRATE (PF) 100 MCG/2ML IJ SOLN
INTRAMUSCULAR | Status: AC | PRN
  Administered 2015-10-06 (×2): 25 ug via INTRAVENOUS

## 2015-10-06 MED ORDER — SODIUM CHLORIDE 0.9 % IV SOLN
Freq: Once | INTRAVENOUS | Status: AC
  Administered 2015-10-06: 14:00:00 via INTRAVENOUS

## 2015-10-06 NOTE — Sedation Documentation (Signed)
Pt NPO since 915 am. tis morning. Dr. Anselm Pancoast aware. Will proceed with pain medication only. Pt agrees with plan.

## 2015-10-06 NOTE — Sedation Documentation (Signed)
Patient is resting comfortably. 

## 2015-10-06 NOTE — H&P (Signed)
Chief Complaint: Patient was seen in consultation today for random renal biopsy at the request of Webb,Martin  Referring Physician(s): Webb,Martin Dr Julien Nordmann  History of Present Illness: Lonnie [name] is a 81 y.o. male   Pt with monoclonal gammopathy followed with Oncology Also noted proteinuria and kidney dysfunction Now scheduled for random renal biopsy If bx would reveal myeloma kidney---treatment may change  Dr Anselm Pancoast has discussed biopsy with patient. Feels with advanced age and risk of bleeding; best to admit For 23 hr obs post procedure  Past Medical History  Diagnosis Date  . Adenomatous polyps   . GERD (gastroesophageal reflux disease)   . Hypothyroidism   . Hypertension   . Frequent urination at night   . Polyclonal gammopathy determined by serum protein electrophoresis 10/04/2015    Past Surgical History  Procedure Laterality Date  . Skin surgery      back    Allergies: Review of patient's allergies indicates no known allergies.  Medications: Prior to Admission medications   Medication Sig Start Date End Date Taking? Authorizing Provider  alum & mag hydroxide-simeth (MAALOX PLUS) 400-400-40 MG/5ML suspension Take 10 mLs by mouth every 6 (six) hours as needed for indigestion.   Yes Historical Provider, MD  amLODipine (NORVASC) 5 MG tablet Take 1 tablet by mouth at bedtime.  03/23/14  Yes Historical Provider, MD  buPROPion (WELLBUTRIN) 75 MG tablet Take 75 mg by mouth daily. 09/13/15  Yes Historical Provider, MD  clotrimazole (LOTRIMIN) 1 % cream Apply 1 application topically 2 (two) times daily. Use of 7-10 days. Patient taking differently: Apply 1 application topically 2 (two) times daily as needed. Use of 7-10 days. 05/30/14  Yes Angelica Ran, MD  finasteride (PROSCAR) 5 MG tablet Take 5 mg by mouth daily. 07/16/15  Yes Historical Provider, MD  levothyroxine (SYNTHROID, LEVOTHROID) 88 MCG tablet Take 88 mcg by mouth at bedtime.   Yes Historical  Provider, MD  polyvinyl alcohol (LIQUIFILM TEARS) 1.4 % ophthalmic solution Place 1-2 drops into both eyes as needed for dry eyes.   Yes Historical Provider, MD  psyllium (METAMUCIL) 58.6 % powder Take 1 packet by mouth at bedtime. Tablespoon and a half.   Yes Historical Provider, MD  ranitidine (ZANTAC) 150 MG capsule Take 150 mg by mouth 2 (two) times daily.   Yes Historical Provider, MD     Family History  Problem Relation Age of Onset  . Diabetes Father   . Diabetes Brother   . Heart disease Brother   . Colon cancer Neg Hx   . Rectal cancer Neg Hx     Social History   Social History  . Marital Status: Widowed    Spouse Name: N/A  . Number of Children: N/A  . Years of Education: N/A   Social History Main Topics  . Smoking status: Former Smoker    Quit date: 01/25/1980  . Smokeless tobacco: Never Used  . Alcohol Use: No     Comment: former alcoholic 32 years ago was last drink  . Drug Use: No  . Sexual Activity: Not Asked   Other Topics Concern  . None   Social History Narrative    Review of Systems: A 12 point ROS discussed and pertinent positives are indicated in the HPI above.  All other systems are negative.  Review of Systems  Constitutional: Negative for fever, activity change and appetite change.  Respiratory: Negative for cough and shortness of breath.   Gastrointestinal: Negative for abdominal pain.  Genitourinary: Negative for difficulty urinating.  Neurological: Positive for weakness.  Psychiatric/Behavioral: Negative for behavioral problems and confusion.    Vital Signs: BP 159/80 mmHg  Pulse 64  Temp(Src) 97.4 F (36.3 C) (Oral)  Resp 16  Ht 5\' 8"  (1.727 m)  Wt 178 lb (80.74 kg)  BMI 27.07 kg/m2  SpO2 100%  Physical Exam  Constitutional: He is oriented to person, place, and time.  Cardiovascular: Normal rate, regular rhythm and normal heart sounds.   Pulmonary/Chest: Effort normal and breath sounds normal. He has no wheezes.  Abdominal:  Soft. Bowel sounds are normal. There is no tenderness.  Musculoskeletal: Normal range of motion.  Neurological: He is alert and oriented to person, place, and time.  Skin: Skin is warm and dry.  Psychiatric: He has a normal mood and affect. His behavior is normal. Judgment and thought content normal.  Nursing note and vitals reviewed.   Mallampati Score:  MD Evaluation Airway: WNL Heart: WNL Abdomen: WNL Chest/ Lungs: WNL ASA  Classification: 3 Mallampati/Airway Score: One  Imaging: No results found.  Labs:  CBC:  Recent Labs  02/21/15 0845 03/09/15 0755 09/27/15 1129 10/06/15 1214  WBC 6.6 7.0 5.9 6.3  HGB 11.4* 11.6* 11.1* 10.7*  HCT 34.6* 35.0* 33.5* 32.4*  PLT 272 250 232 204    COAGS:  Recent Labs  03/09/15 0755  INR 1.01    BMP:  Recent Labs  11/25/14 0854 02/21/15 0846 09/27/15 1130  NA 138 140 136  K 4.8 4.8 5.2*  CO2 24 22 22   GLUCOSE 103 107 102  BUN 41.9* 48.8* 48.0*  CALCIUM 9.0 8.9 9.5  CREATININE 2.3* 2.6* 2.8*    LIVER FUNCTION TESTS:  Recent Labs  11/25/14 0854 02/21/15 0846 09/27/15 1130  BILITOT 0.30 0.30 0.32  AST 20 19 18   ALT 17 13 12   ALKPHOS 112 107 128  PROT 6.8 7.3 7.3  ALBUMIN 3.5 3.6 3.7    TUMOR MARKERS: No results for input(s): AFPTM, CEA, CA199, CHROMGRNA in the last 8760 hours.  Assessment and Plan:  Monoclonal gammopathy of undetermined significance Proteinuria Scheduled for random renal biopsy Risks and Benefits discussed with the patient including, but not limited to bleeding, infection, damage to adjacent structures or low yield requiring additional tests. All of the patient's questions were answered, patient is agreeable to proceed. Consent signed and in chart.   Thank you for this interesting consult.  I greatly enjoyed meeting Lonnie [name] and look forward to participating in their care.  A copy of this report was sent to the requesting provider on this date.  Signed: Dania Marsan  A 10/06/2015, 12:30 PM   I spent a total of  30 Minutes   in face to face in clinical consultation, greater than 50% of which was counseling/coordinating care for random renal bx

## 2015-10-06 NOTE — Procedures (Signed)
US guided left renal biopsy.  2 cores obtained.  No immediate complication and minimal blood loss.  Plan for 23 hour observation with bedrest.

## 2015-10-06 NOTE — Care Management Note (Addendum)
Case Management Note  Patient Details  Name: Lonnie [name] MRN: [id number] Date of Birth: February 08, 1925  Subjective/Objective:                  Date-10-06-15 Initial Assessment Spoke with patient at the bedside. Introduced self as Tourist information centre manager and explained role in discharge planning and how to be reached.  Verified patient lives [facility], in a house, by himself. Verified patient anticipates to go home alone, at time of discharge. Patient has no DME. Expressed potential need for no other DME.  Patient denied  needing help with their medication.  Patient drives to MD appointments.  Verified patient has PCP Dr Dagmar Hait. Patient states they currently receive [place] services through no one.    Plan: CM will continue to follow for discharge planning and [facility] resources.   Carles Collet RN BSN CM [phone]   Action/Plan:  No CM needs at this time, will to home self care.   Expected Discharge Date:                  Expected Discharge Plan:  Home/Self Care  In-House Referral:     Discharge planning Services  CM Consult  Post Acute Care Choice:    Choice offered to:     DME Arranged:    DME Agency:     HH Arranged:    HH Agency:     Status of Service:  In process, will continue to follow  Medicare Important Message Given:    Date Medicare IM Given:    Medicare IM give by:    Date Additional Medicare IM Given:    Additional Medicare Important Message give by:     If discussed at [place] of Stay Meetings, dates discussed:    Additional Comments:  Carles Collet, RN 10/06/2015, 3:05 PM

## 2015-10-06 NOTE — Progress Notes (Signed)
NURSING PROGRESS NOTE  Lonnie [name] [id number] Admission Data: 10/06/2015 3:30 PM Attending Provider: Markus Daft, MD [id number], MD Code Status: Full Code  Lonnie [name] is a 81 y.o. male patient admitted:  -No acute distress noted.  -No complaints of shortness of breath.  -No complaints of chest pain.   Blood pressure 153/68, pulse 62, temperature 98.3 F (36.8 C), temperature source Oral, resp. rate 14, SpO2 98 %.   IV Fluids:  IV in place, occlusive dsg intact without redness, IV cath forearm right, condition patent and no redness.  Allergies:  Review of patient's allergies indicates no known allergies.  Past Medical History:   has a past medical history of Adenomatous polyps; GERD (gastroesophageal reflux disease); Hypothyroidism; Hypertension; Frequent urination at night; and Polyclonal gammopathy determined by serum protein electrophoresis (10/04/2015).  Past Surgical History:   has past surgical history that includes Skin surgery and Renal biopsy (Left, 10/06/2015).  Social History:   reports that he quit smoking about 35 years ago. He has never used smokeless tobacco. He reports that he does not drink alcohol or use illicit drugs.   Patient/Family orientated to room. Information packet given to patient/family. Admission inpatient armband information verified with patient/family to include name and date of birth and placed on patient arm. Side rails up x 2, fall assessment and education completed with patient/family. Patient/family able to verbalize understanding of risk associated with falls and verbalized understanding to call for assistance before getting out of bed. Call light within reach. Patient/family able to voice and demonstrate understanding of unit orientation instructions.    Will continue to evaluate and treat per MD orders.     Doristine Devoid, RN

## 2015-10-07 DIAGNOSIS — Z79899 Other long term (current) drug therapy: Secondary | ICD-10-CM | POA: Diagnosis not present

## 2015-10-07 DIAGNOSIS — E039 Hypothyroidism, unspecified: Secondary | ICD-10-CM | POA: Diagnosis not present

## 2015-10-07 DIAGNOSIS — I10 Essential (primary) hypertension: Secondary | ICD-10-CM | POA: Diagnosis not present

## 2015-10-07 DIAGNOSIS — D472 Monoclonal gammopathy: Secondary | ICD-10-CM | POA: Diagnosis not present

## 2015-10-07 DIAGNOSIS — Z87891 Personal history of nicotine dependence: Secondary | ICD-10-CM | POA: Diagnosis not present

## 2015-10-07 LAB — CBC
HCT: 29.1 % — ABNORMAL LOW (ref 39.0–52.0)
Hemoglobin: 9.8 g/dL — ABNORMAL LOW (ref 13.0–17.0)
MCH: 30.8 pg (ref 26.0–34.0)
MCHC: 33.7 g/dL (ref 30.0–36.0)
MCV: 91.5 fL (ref 78.0–100.0)
PLATELETS: 191 10*3/uL (ref 150–400)
RBC: 3.18 MIL/uL — ABNORMAL LOW (ref 4.22–5.81)
RDW: 12.6 % (ref 11.5–15.5)
WBC: 6.5 10*3/uL (ref 4.0–10.5)

## 2015-10-07 NOTE — Discharge Summary (Signed)
Patient ID: Lonnie [name] MRN: [id number] DOB/AGE: 1925/08/23 81 y.o.  Admit date: 10/06/2015 Discharge date: 10/07/2015  Admission Diagnoses: 81 yo with monoclonal gammopathy and proteinuria.  Discharge Diagnoses:  Active Problems:   Monoclonal gammopathy   Discharged Condition: stable  Hospital Course: Monoclonal gammopathy of undetermined significance; Proteinuria Admitted yesterday after random renal biopsy performed in [place] with Dr Anselm Pancoast. No complication of procedure , but with advanced age and bleeding risk--Dr Anselm Pancoast felt best to admit for 23 hrs observation. Overnight stay was uneventful. Eating well; slept well No complaints H/H stable UOP good- yellow I have seen and examined pt I have discussed pt status with dr Anselm Pancoast. Plan now for discharge home   Consults: None  Significant Diagnostic Studies: Renal US  Treatments: US guided left renal biopsy. 2 cores obtained. No immediate complication and minimal blood loss.  Discharge Exam: Blood pressure 153/68, pulse 62, temperature 98.3 F (36.8 C), temperature source Oral, resp. rate 14, height 5\' 8"  (1.727 m), weight 178 lb (80.74 kg), SpO2 98 %.  PE:  A/O Appropriate Heart: RRR Lungs; CTA Abd: soft; +BS; NT Left flank: site clean and dry; NT; no swelling No bleeding; no hematoma Ext: FROM; ambulating in room UOP good- yellow  Results for orders placed or performed during the hospital encounter of 10/06/15  CBC  Result Value Ref Range   WBC 6.1 4.0 - 10.5 K/uL   RBC 3.31 (L) 4.22 - 5.81 MIL/uL   Hemoglobin 10.0 (L) 13.0 - 17.0 g/dL   HCT 30.2 (L) 39.0 - 52.0 %   MCV 91.2 78.0 - 100.0 fL   MCH 30.2 26.0 - 34.0 pg   MCHC 33.1 30.0 - 36.0 g/dL   RDW 12.6 11.5 - 15.5 %   Platelets 196 150 - 400 K/uL  CBC  Result Value Ref Range   WBC 6.5 4.0 - 10.5 K/uL   RBC 3.18 (L) 4.22 - 5.81 MIL/uL   Hemoglobin 9.8 (L) 13.0 - 17.0 g/dL   HCT 29.1 (L) 39.0 - 52.0 %   MCV 91.5 78.0 - 100.0 fL   MCH 30.8 26.0 -  34.0 pg   MCHC 33.7 30.0 - 36.0 g/dL   RDW 12.6 11.5 - 15.5 %   Platelets 191 150 - 400 K/uL     Disposition: Monoclonal Gammopathy of undetermined significance Proteinuria Request was made for renal bx Random Renal Biopsy was performed 10/06/15 Pathology pending I have seen and examined pt Now for discharge Pt has good understanding of dc instuctions  Discharge Instructions    Call MD for:  difficulty breathing, headache or visual disturbances    Complete by:  As directed      Call MD for:  extreme fatigue    Complete by:  As directed      Call MD for:  hives    Complete by:  As directed      Call MD for:  persistant dizziness or light-headedness    Complete by:  As directed      Call MD for:  persistant nausea and vomiting    Complete by:  As directed      Call MD for:  redness, tenderness, or signs of infection (pain, swelling, redness, odor or green/yellow discharge around incision site)    Complete by:  As directed      Call MD for:  severe uncontrolled pain    Complete by:  As directed      Call MD for:  temperature >100.4  Complete by:  As directed      Diet - low sodium heart healthy    Complete by:  As directed      Discharge instructions    Complete by:  As directed   Resume home meds; follow up with Dr Justin Mend and Dr Julien Nordmann     Discharge wound care:    Complete by:  As directed   May shower today; keep clean new bandaid on site x 3-5 days     Increase activity slowly    Complete by:  As directed             Medication List    TAKE these medications        alum & mag hydroxide-simeth [id number] MG/5ML suspension  Commonly known as:  MAALOX PLUS  Take 10 mLs by mouth every 6 (six) hours as needed for indigestion.     amLODipine 5 MG tablet  Commonly known as:  NORVASC  Take 1 tablet by mouth at bedtime.     buPROPion 75 MG tablet  Commonly known as:  WELLBUTRIN  Take 75 mg by mouth daily.     clotrimazole 1 % cream  Commonly known as:  LOTRIMIN    Apply 1 application topically 2 (two) times daily. Use of 7-10 days.     finasteride 5 MG tablet  Commonly known as:  PROSCAR  Take 5 mg by mouth daily.     levothyroxine 88 MCG tablet  Commonly known as:  SYNTHROID, LEVOTHROID  Take 88 mcg by mouth at bedtime.     polyvinyl alcohol 1.4 % ophthalmic solution  Commonly known as:  LIQUIFILM TEARS  Place 1-2 drops into both eyes as needed for dry eyes.     psyllium 58.6 % powder  Commonly known as:  METAMUCIL  Take 1 packet by mouth at bedtime. Tablespoon and a half.     ranitidine 150 MG capsule  Commonly known as:  ZANTAC  Take 150 mg by mouth 2 (two) times daily.          SignedMonia Sabal A 10/07/2015, 9:31 AM   I have spent Greater Than 30 Minutes discharging Lonnie [name].

## 2015-10-07 NOTE — Progress Notes (Signed)
Nsg Discharge Note  Admit Date:  10/06/2015 Discharge date: 10/07/2015   Lonnie [name] to be D/C'd Home per MD order.  AVS completed.  Copy for chart, and copy for patient signed, and dated. Patient/caregiver able to verbalize understanding.  Discharge Medication:   Medication List    TAKE these medications        alum & mag hydroxide-simeth [id number] MG/5ML suspension  Commonly known as:  MAALOX PLUS  Take 10 mLs by mouth every 6 (six) hours as needed for indigestion.     amLODipine 5 MG tablet  Commonly known as:  NORVASC  Take 1 tablet by mouth at bedtime.     buPROPion 75 MG tablet  Commonly known as:  WELLBUTRIN  Take 75 mg by mouth daily.     clotrimazole 1 % cream  Commonly known as:  LOTRIMIN  Apply 1 application topically 2 (two) times daily. Use of 7-10 days.     finasteride 5 MG tablet  Commonly known as:  PROSCAR  Take 5 mg by mouth daily.     levothyroxine 88 MCG tablet  Commonly known as:  SYNTHROID, LEVOTHROID  Take 88 mcg by mouth at bedtime.     polyvinyl alcohol 1.4 % ophthalmic solution  Commonly known as:  LIQUIFILM TEARS  Place 1-2 drops into both eyes as needed for dry eyes.     psyllium 58.6 % powder  Commonly known as:  METAMUCIL  Take 1 packet by mouth at bedtime. Tablespoon and a half.     ranitidine 150 MG capsule  Commonly known as:  ZANTAC  Take 150 mg by mouth 2 (two) times daily.        Discharge Assessment: Filed Vitals:   10/06/15 1500  BP: 153/68  Pulse: 62  Temp: 98.3 F (36.8 C)  Resp: 14   Skin clean, dry and intact without evidence of skin break down, no evidence of skin tears noted. IV catheter discontinued intact. Site without signs and symptoms of complications - no redness or edema noted at insertion site, patient denies c/o pain - only slight tenderness at site.  Dressing with slight pressure applied.  D/c Instructions-Education: Discharge instructions given to patient/family with verbalized  understanding. D/c education completed with patient/family including follow up instructions, medication list, d/c activities limitations if indicated, with other d/c instructions as indicated by MD - patient able to verbalize understanding, all questions fully answered. Patient instructed to return to ED, call 911, or call MD for any changes in condition.  Patient escorted via [place], and D/C home via private auto.  Salley Slaughter, RN 10/07/2015 10:37 AM

## 2015-10-10 ENCOUNTER — Emergency Department (INDEPENDENT_AMBULATORY_CARE_PROVIDER_SITE_OTHER)
Admission: EM | Admit: 2015-10-10 | Discharge: 2015-10-10 | Disposition: A | Discharge status: Home / Self Care | Payer: Medicare Other | Source: Home / Self Care | Attending: Family Medicine | Admitting: Family Medicine

## 2015-10-10 ENCOUNTER — Encounter (HOSPITAL_COMMUNITY): Payer: Self-pay | Admitting: *Deleted

## 2015-10-10 DIAGNOSIS — S61432A Puncture wound without foreign body of left hand, initial encounter: Secondary | ICD-10-CM

## 2015-10-10 DIAGNOSIS — Z23 Encounter for immunization: Secondary | ICD-10-CM | POA: Diagnosis not present

## 2015-10-10 MED ORDER — TETANUS-DIPHTH-ACELL PERTUSSIS 5-2.5-18.5 LF-MCG/0.5 IM SUSP
INTRAMUSCULAR | Status: AC
  Filled 2015-10-10: qty 0.5

## 2015-10-10 MED ORDER — TETANUS-DIPHTH-ACELL PERTUSSIS 5-2.5-18.5 LF-MCG/0.5 IM SUSP
0.5000 mL | Freq: Once | INTRAMUSCULAR | Status: AC
  Administered 2015-10-10: 0.5 mL via INTRAMUSCULAR

## 2015-10-10 NOTE — Discharge Instructions (Signed)
Keep clean, use bacitracin oint twice a day, you had a tetanus booster, see your doctor as needed,

## 2015-10-10 NOTE — ED Notes (Signed)
Pt  Reports    She  Sustained  A   Lac  To  The  l  Hand      About  sev  Weeks  Ago     Pt reports   It  Is  Healing   However      She  Does not   Appear  To  Be  Healing  To  His satisfaction       He  Is  Unsure  As    To  His  Last  Tet  Shot

## 2015-10-10 NOTE — ED Provider Notes (Signed)
CSN: [id number]     Arrival date & time 10/10/15  1506 History   First MD Initiated Contact with Patient 10/10/15 1728     Chief Complaint  Patient presents with  . Hand Problem   (Consider location/radiation/quality/duration/timing/severity/associated sxs/prior Treatment) Patient is a 81 y.o. male presenting with rash. The history is provided by the patient.  Rash Location:  Hand Hand rash location:  Dorsum of L hand Quality: dryness and scaling   Severity:  Mild Onset quality:  Gradual Duration:  2 weeks Chronicity:  New (scraped on nail in closet, healed.)   Past Medical History  Diagnosis Date  . Adenomatous polyps   . GERD (gastroesophageal reflux disease)   . Hypothyroidism   . Hypertension   . Frequent urination at night   . Polyclonal gammopathy determined by serum protein electrophoresis 10/04/2015   Past Surgical History  Procedure Laterality Date  . Skin surgery      back  . Renal biopsy Left 10/06/2015   Family History  Problem Relation Age of Onset  . Diabetes Father   . Diabetes Brother   . Heart disease Brother   . Colon cancer Neg Hx   . Rectal cancer Neg Hx    Social History  Substance Use Topics  . Smoking status: Former Smoker    Quit date: 01/25/1980  . Smokeless tobacco: Never Used  . Alcohol Use: No     Comment: former alcoholic 32 years ago was last drink    Review of Systems  Skin: Positive for rash and wound.  All other systems reviewed and are negative.   Allergies  Review of patient's allergies indicates no known allergies.  Home Medications   Prior to Admission medications   Medication Sig Start Date End Date Taking? Authorizing Provider  alum & mag hydroxide-simeth (MAALOX PLUS) 400-400-40 MG/5ML suspension Take 10 mLs by mouth every 6 (six) hours as needed for indigestion.    Historical Provider, MD  amLODipine (NORVASC) 5 MG tablet Take 1 tablet by mouth at bedtime.  03/23/14   Historical Provider, MD  buPROPion  (WELLBUTRIN) 75 MG tablet Take 75 mg by mouth daily. 09/13/15   Historical Provider, MD  clotrimazole (LOTRIMIN) 1 % cream Apply 1 application topically 2 (two) times daily. Use of 7-10 days. Patient taking differently: Apply 1 application topically 2 (two) times daily as needed. Use of 7-10 days. 05/30/14   Angelica Ran, MD  finasteride (PROSCAR) 5 MG tablet Take 5 mg by mouth daily. 07/16/15   Historical Provider, MD  levothyroxine (SYNTHROID, LEVOTHROID) 88 MCG tablet Take 88 mcg by mouth at bedtime.    Historical Provider, MD  polyvinyl alcohol (LIQUIFILM TEARS) 1.4 % ophthalmic solution Place 1-2 drops into both eyes as needed for dry eyes.    Historical Provider, MD  psyllium (METAMUCIL) 58.6 % powder Take 1 packet by mouth at bedtime. Tablespoon and a half.    Historical Provider, MD  ranitidine (ZANTAC) 150 MG capsule Take 150 mg by mouth 2 (two) times daily.    Historical Provider, MD   Meds Ordered and Administered this Visit  Medications - No data to display  BP 158/70 mmHg  Pulse 61  Temp(Src) 98.4 F (36.9 C) (Oral)  Resp 16  SpO2 100% No data found.   Physical Exam  Constitutional: He is oriented to person, place, and time. He appears well-developed and well-nourished. He appears distressed.  Neurological: He is alert and oriented to person, place, and time.  Skin: Skin is  warm and dry.     Nursing note and vitals reviewed.   ED Course  Procedures (including critical care time)  Labs Review Labs Reviewed - No data to display  Imaging Review No results found.   Visual Acuity Review  Right Eye Distance:   Left Eye Distance:   Bilateral Distance:    Right Eye Near:   Left Eye Near:    Bilateral Near:         MDM   1. Puncture wound to hand, left, initial encounter        Billy Fischer, MD 10/10/15 [phone]

## 2015-10-14 ENCOUNTER — Encounter (HOSPITAL_COMMUNITY): Payer: Self-pay

## 2015-10-31 DIAGNOSIS — J302 Other seasonal allergic rhinitis: Secondary | ICD-10-CM | POA: Diagnosis not present

## 2015-10-31 DIAGNOSIS — E039 Hypothyroidism, unspecified: Secondary | ICD-10-CM | POA: Diagnosis not present

## 2015-10-31 DIAGNOSIS — Z6826 Body mass index (BMI) 26.0-26.9, adult: Secondary | ICD-10-CM | POA: Diagnosis not present

## 2015-10-31 DIAGNOSIS — I10 Essential (primary) hypertension: Secondary | ICD-10-CM | POA: Diagnosis not present

## 2015-10-31 DIAGNOSIS — N184 Chronic kidney disease, stage 4 (severe): Secondary | ICD-10-CM | POA: Diagnosis not present

## 2015-10-31 DIAGNOSIS — D472 Monoclonal gammopathy: Secondary | ICD-10-CM | POA: Diagnosis not present

## 2015-10-31 DIAGNOSIS — D649 Anemia, unspecified: Secondary | ICD-10-CM | POA: Diagnosis not present

## 2015-11-02 DIAGNOSIS — M62838 Other muscle spasm: Secondary | ICD-10-CM | POA: Diagnosis not present

## 2015-11-02 DIAGNOSIS — M6281 Muscle weakness (generalized): Secondary | ICD-10-CM | POA: Diagnosis not present

## 2015-11-02 DIAGNOSIS — R278 Other lack of coordination: Secondary | ICD-10-CM | POA: Diagnosis not present

## 2015-11-02 DIAGNOSIS — R32 Unspecified urinary incontinence: Secondary | ICD-10-CM | POA: Diagnosis not present

## 2015-11-11 DIAGNOSIS — M6281 Muscle weakness (generalized): Secondary | ICD-10-CM | POA: Diagnosis not present

## 2015-11-11 DIAGNOSIS — R32 Unspecified urinary incontinence: Secondary | ICD-10-CM | POA: Diagnosis not present

## 2015-11-11 DIAGNOSIS — R278 Other lack of coordination: Secondary | ICD-10-CM | POA: Diagnosis not present

## 2015-11-11 DIAGNOSIS — M62838 Other muscle spasm: Secondary | ICD-10-CM | POA: Diagnosis not present

## 2015-11-15 DIAGNOSIS — N189 Chronic kidney disease, unspecified: Secondary | ICD-10-CM | POA: Diagnosis not present

## 2015-11-15 DIAGNOSIS — D631 Anemia in chronic kidney disease: Secondary | ICD-10-CM | POA: Diagnosis not present

## 2015-11-15 DIAGNOSIS — N2581 Secondary hyperparathyroidism of renal origin: Secondary | ICD-10-CM | POA: Diagnosis not present

## 2015-11-15 DIAGNOSIS — E039 Hypothyroidism, unspecified: Secondary | ICD-10-CM | POA: Diagnosis not present

## 2015-11-15 DIAGNOSIS — D472 Monoclonal gammopathy: Secondary | ICD-10-CM | POA: Diagnosis not present

## 2015-11-15 DIAGNOSIS — N184 Chronic kidney disease, stage 4 (severe): Secondary | ICD-10-CM | POA: Diagnosis not present

## 2015-11-15 DIAGNOSIS — R809 Proteinuria, unspecified: Secondary | ICD-10-CM | POA: Diagnosis not present

## 2015-12-06 ENCOUNTER — Encounter (HOSPITAL_COMMUNITY): Payer: Self-pay | Admitting: Emergency Medicine

## 2015-12-06 ENCOUNTER — Ambulatory Visit (EMERGENCY_DEPARTMENT_HOSPITAL): Payer: Medicare Other

## 2015-12-06 ENCOUNTER — Emergency Department (HOSPITAL_COMMUNITY)
Admission: EM | Admit: 2015-12-06 | Discharge: 2015-12-06 | Disposition: A | Discharge status: Home / Self Care | Payer: Medicare Other | Attending: Physician Assistant | Admitting: Physician Assistant

## 2015-12-06 DIAGNOSIS — Z87891 Personal history of nicotine dependence: Secondary | ICD-10-CM | POA: Diagnosis not present

## 2015-12-06 DIAGNOSIS — M79661 Pain in right lower leg: Secondary | ICD-10-CM | POA: Insufficient documentation

## 2015-12-06 DIAGNOSIS — Z79899 Other long term (current) drug therapy: Secondary | ICD-10-CM | POA: Insufficient documentation

## 2015-12-06 DIAGNOSIS — M79609 Pain in unspecified limb: Secondary | ICD-10-CM | POA: Diagnosis not present

## 2015-12-06 DIAGNOSIS — I10 Essential (primary) hypertension: Secondary | ICD-10-CM | POA: Insufficient documentation

## 2015-12-06 DIAGNOSIS — K219 Gastro-esophageal reflux disease without esophagitis: Secondary | ICD-10-CM | POA: Insufficient documentation

## 2015-12-06 DIAGNOSIS — M25561 Pain in right knee: Secondary | ICD-10-CM | POA: Diagnosis not present

## 2015-12-06 DIAGNOSIS — M79604 Pain in right leg: Secondary | ICD-10-CM

## 2015-12-06 DIAGNOSIS — E039 Hypothyroidism, unspecified: Secondary | ICD-10-CM | POA: Diagnosis not present

## 2015-12-06 NOTE — ED Notes (Addendum)
Pt sts right knee pain behind knee down into calf; pt denies injury or swelling; pt sts pain x 3 days and sent from [facility] to r/o DVT

## 2015-12-06 NOTE — Progress Notes (Signed)
VASCULAR LAB PRELIMINARY  PRELIMINARY  PRELIMINARY  PRELIMINARY   Right lower extremity venous Doppler completed. Right:  No evidence of DVT, superficial thrombosis. Incident finding Baker's identified in right pop fossa.        Asees Manfredi, RVT, RDMS 12/06/2015, 1:51 PM

## 2015-12-06 NOTE — Discharge Instructions (Signed)
Please read attached information. If you experience any new or worsening signs or symptoms please return to the emergency room for evaluation. Please follow-up with your primary care provider or specialist as discussed. Please use medication prescribed only as directed and discontinue taking if you have any concerning signs or symptoms.   °

## 2015-12-06 NOTE — ED Provider Notes (Signed)
CSN: [id number]     Arrival date & time 12/06/15  1126 History  By signing my name below, I, Lonnie [name], attest that this documentation has been prepared under the direction and in the presence of [employer], PA-C. Electronically Signed: Rayna [name], ED Scribe. 12/06/2015. 3:07 PM.   Chief Complaint  Patient presents with  . Leg Pain   The history is provided by the patient. No language interpreter was used.     HPI Comments:   Lonnie [name] is a 82 y.o. male who presents to the Emergency Department complaining of constant, moderate, pain to his right calf with onset 2 days ago. He was seen at [facility] and was sent to the ED for an [place] to rule out DVT. Pt notes worsening pain when walking which causes him to nearly fall. Pt denies any known injuries or a recent increase in activity. He denies fevers, chills, dizziness and n/v. At time of evaluation patient reports symptoms have dramatically improved, has minimal pain, is able to ambulate without difficulty or unsteady gait.   Past Medical History  Diagnosis Date  . Adenomatous polyps   . GERD (gastroesophageal reflux disease)   . Hypothyroidism   . Hypertension   . Frequent urination at night   . Polyclonal gammopathy determined by serum protein electrophoresis 10/04/2015   Past Surgical History  Procedure Laterality Date  . Skin surgery      back  . Renal biopsy Left 10/06/2015   Family History  Problem Relation Age of Onset  . Diabetes Father   . Diabetes Brother   . Heart disease Brother   . Colon cancer Neg Hx   . Rectal cancer Neg Hx    Social History  Substance Use Topics  . Smoking status: Former Smoker    Quit date: 01/25/1980  . Smokeless tobacco: Never Used  . Alcohol Use: No     Comment: former alcoholic 32 years ago was last drink    Review of Systems  All other systems reviewed and are negative.   Allergies  Review of patient's allergies indicates no known allergies.  Home Medications    Prior to Admission medications   Medication Sig Start Date End Date Taking? Authorizing Provider  alum & mag hydroxide-simeth (MAALOX PLUS) 400-400-40 MG/5ML suspension Take 10 mLs by mouth every 6 (six) hours as needed for indigestion.    Historical Provider, MD  amLODipine (NORVASC) 5 MG tablet Take 1 tablet by mouth at bedtime.  03/23/14   Historical Provider, MD  buPROPion (WELLBUTRIN) 75 MG tablet Take 75 mg by mouth daily. 09/13/15   Historical Provider, MD  clotrimazole (LOTRIMIN) 1 % cream Apply 1 application topically 2 (two) times daily. Use of 7-10 days. Patient taking differently: Apply 1 application topically 2 (two) times daily as needed. Use of 7-10 days. 05/30/14   Angelica Ran, MD  finasteride (PROSCAR) 5 MG tablet Take 5 mg by mouth daily. 07/16/15   Historical Provider, MD  levothyroxine (SYNTHROID, LEVOTHROID) 88 MCG tablet Take 88 mcg by mouth at bedtime.    Historical Provider, MD  polyvinyl alcohol (LIQUIFILM TEARS) 1.4 % ophthalmic solution Place 1-2 drops into both eyes as needed for dry eyes.    Historical Provider, MD  psyllium (METAMUCIL) 58.6 % powder Take 1 packet by mouth at bedtime. Tablespoon and a half.    Historical Provider, MD  ranitidine (ZANTAC) 150 MG capsule Take 150 mg by mouth 2 (two) times daily.    Historical Provider,  MD   BP 158/69 mmHg  Pulse 59  Temp(Src) 97.7 F (36.5 C) (Oral)  Resp 15  Ht 5\' 8"  (1.727 m)  Wt 80.604 kg  BMI 27.03 kg/m2  SpO2 100%    Physical Exam  Constitutional: He is oriented to person, place, and time. He appears well-developed.  HENT:  Head: Normocephalic and atraumatic.  Neck: Normal range of motion.  Cardiovascular: Normal rate.   Pulmonary/Chest: Effort normal. No respiratory distress.  Abdominal: Soft.  Musculoskeletal: Normal range of motion. He exhibits tenderness. He exhibits no edema.  Legs are equal bilateral with no obvious swelling or edema; right leg has minor TTP of the posterior knee at the  insertion of the gastra; no palpable effusion; remainder of knee is non-tender to palpation; full active ROM; no redness, edema, rash or any other abnormalities; distal sensation, strength and motor function is intact; pedal pulses 2+; pt ambulates w/o difficulty  Neurological: He is alert and oriented to person, place, and time.  Skin: Skin is warm and dry. He is not diaphoretic.  Psychiatric: He has a normal mood and affect. His behavior is normal.  Nursing note and vitals reviewed.   ED Course  Procedures  DIAGNOSTIC STUDIES: Oxygen Saturation is 100% on RA, normal by my interpretation.    COORDINATION OF CARE: 2:51 PM Pt presents today due to right calf pain. Discussed results of the [place] and next steps including recommendations to follow up with his PCP, apply ice, stretch and take OTC tylenol as needed. Pt understands and is agreeable to the plan.   Labs Review Labs Reviewed - No data to display  Imaging Review No results found.   EKG Interpretation None      MDM   Final diagnoses:  Pain of right lower extremity    Labs: none indicated  Imaging: Venous Doppler negative for DVT, superficial thrombosis- Baker's cyst identified  Consults: none  Therapeutics:   Assessment:  Plan: Patient's presentation most consistent with muscular strain. Patient has no appreciable swelling to the knee, significant tenderness to palpation. He has no signs of infectious etiology, no tenderness to palpation of the lower extremity that would necessitate further evaluation or management here in the ED. Patient is able to ambulate without difficulty, he'll be encouraged to use Tylenol as needed for pain, follow-up with primary care provider for reevaluation. Pt given strict return precautions, verbalized understanding and agreement to today's plan and had no further questions or concerns at the time of discharge.    I personally performed the services described in this documentation, which  was scribed in my presence. The recorded information has been reviewed and is accurate.    Okey Regal, PA-C 12/06/15 1805  Courteney Julio Alm, MD 12/07/15 1657

## 2015-12-14 DIAGNOSIS — I7300 Raynaud's syndrome without gangrene: Secondary | ICD-10-CM | POA: Diagnosis not present

## 2015-12-14 DIAGNOSIS — Z6825 Body mass index (BMI) 25.0-25.9, adult: Secondary | ICD-10-CM | POA: Diagnosis not present

## 2015-12-14 DIAGNOSIS — M7121 Synovial cyst of popliteal space [Baker], right knee: Secondary | ICD-10-CM | POA: Diagnosis not present

## 2015-12-16 DIAGNOSIS — R278 Other lack of coordination: Secondary | ICD-10-CM | POA: Diagnosis not present

## 2015-12-16 DIAGNOSIS — R32 Unspecified urinary incontinence: Secondary | ICD-10-CM | POA: Diagnosis not present

## 2015-12-16 DIAGNOSIS — M6281 Muscle weakness (generalized): Secondary | ICD-10-CM | POA: Diagnosis not present

## 2015-12-16 DIAGNOSIS — M62838 Other muscle spasm: Secondary | ICD-10-CM | POA: Diagnosis not present

## 2015-12-23 DIAGNOSIS — M1711 Unilateral primary osteoarthritis, right knee: Secondary | ICD-10-CM | POA: Diagnosis not present

## 2015-12-30 DIAGNOSIS — M6281 Muscle weakness (generalized): Secondary | ICD-10-CM | POA: Diagnosis not present

## 2015-12-30 DIAGNOSIS — R278 Other lack of coordination: Secondary | ICD-10-CM | POA: Diagnosis not present

## 2015-12-30 DIAGNOSIS — M62838 Other muscle spasm: Secondary | ICD-10-CM | POA: Diagnosis not present

## 2015-12-30 DIAGNOSIS — N3281 Overactive bladder: Secondary | ICD-10-CM | POA: Diagnosis not present

## 2015-12-30 DIAGNOSIS — R32 Unspecified urinary incontinence: Secondary | ICD-10-CM | POA: Diagnosis not present

## 2016-02-07 DIAGNOSIS — N401 Enlarged prostate with lower urinary tract symptoms: Secondary | ICD-10-CM | POA: Diagnosis not present

## 2016-02-07 DIAGNOSIS — Z0000 Encounter for general adult medical examination without abnormal findings: Secondary | ICD-10-CM | POA: Diagnosis not present

## 2016-02-07 DIAGNOSIS — N3281 Overactive bladder: Secondary | ICD-10-CM | POA: Diagnosis not present

## 2016-02-07 DIAGNOSIS — N138 Other obstructive and reflux uropathy: Secondary | ICD-10-CM | POA: Diagnosis not present

## 2016-02-13 DIAGNOSIS — N2581 Secondary hyperparathyroidism of renal origin: Secondary | ICD-10-CM | POA: Diagnosis not present

## 2016-02-13 DIAGNOSIS — D631 Anemia in chronic kidney disease: Secondary | ICD-10-CM | POA: Diagnosis not present

## 2016-02-13 DIAGNOSIS — N189 Chronic kidney disease, unspecified: Secondary | ICD-10-CM | POA: Diagnosis not present

## 2016-02-13 DIAGNOSIS — N184 Chronic kidney disease, stage 4 (severe): Secondary | ICD-10-CM | POA: Diagnosis not present

## 2016-02-13 DIAGNOSIS — D472 Monoclonal gammopathy: Secondary | ICD-10-CM | POA: Diagnosis not present

## 2016-02-13 DIAGNOSIS — E039 Hypothyroidism, unspecified: Secondary | ICD-10-CM | POA: Diagnosis not present

## 2016-02-13 DIAGNOSIS — R809 Proteinuria, unspecified: Secondary | ICD-10-CM | POA: Diagnosis not present

## 2016-02-28 DIAGNOSIS — D649 Anemia, unspecified: Secondary | ICD-10-CM | POA: Diagnosis not present

## 2016-02-28 DIAGNOSIS — E039 Hypothyroidism, unspecified: Secondary | ICD-10-CM | POA: Diagnosis not present

## 2016-02-28 DIAGNOSIS — Z6825 Body mass index (BMI) 25.0-25.9, adult: Secondary | ICD-10-CM | POA: Diagnosis not present

## 2016-02-28 DIAGNOSIS — D472 Monoclonal gammopathy: Secondary | ICD-10-CM | POA: Diagnosis not present

## 2016-02-28 DIAGNOSIS — R6884 Jaw pain: Secondary | ICD-10-CM | POA: Diagnosis not present

## 2016-02-28 DIAGNOSIS — N184 Chronic kidney disease, stage 4 (severe): Secondary | ICD-10-CM | POA: Diagnosis not present

## 2016-02-28 DIAGNOSIS — I10 Essential (primary) hypertension: Secondary | ICD-10-CM | POA: Diagnosis not present

## 2016-02-28 DIAGNOSIS — F329 Major depressive disorder, single episode, unspecified: Secondary | ICD-10-CM | POA: Diagnosis not present

## 2016-03-19 ENCOUNTER — Emergency Department (HOSPITAL_COMMUNITY)
Admission: EM | Admit: 2016-03-19 | Discharge: 2016-03-19 | Disposition: A | Discharge status: Home / Self Care | Payer: Medicare Other | Attending: Emergency Medicine | Admitting: Emergency Medicine

## 2016-03-19 ENCOUNTER — Encounter (HOSPITAL_COMMUNITY): Payer: Self-pay | Admitting: Emergency Medicine

## 2016-03-19 DIAGNOSIS — I10 Essential (primary) hypertension: Secondary | ICD-10-CM | POA: Insufficient documentation

## 2016-03-19 DIAGNOSIS — Z86018 Personal history of other benign neoplasm: Secondary | ICD-10-CM | POA: Insufficient documentation

## 2016-03-19 DIAGNOSIS — N289 Disorder of kidney and ureter, unspecified: Secondary | ICD-10-CM

## 2016-03-19 DIAGNOSIS — Z862 Personal history of diseases of the blood and blood-forming organs and certain disorders involving the immune mechanism: Secondary | ICD-10-CM | POA: Diagnosis not present

## 2016-03-19 DIAGNOSIS — E875 Hyperkalemia: Secondary | ICD-10-CM | POA: Insufficient documentation

## 2016-03-19 DIAGNOSIS — E039 Hypothyroidism, unspecified: Secondary | ICD-10-CM | POA: Insufficient documentation

## 2016-03-19 DIAGNOSIS — K219 Gastro-esophageal reflux disease without esophagitis: Secondary | ICD-10-CM | POA: Diagnosis not present

## 2016-03-19 DIAGNOSIS — Z79899 Other long term (current) drug therapy: Secondary | ICD-10-CM | POA: Diagnosis not present

## 2016-03-19 DIAGNOSIS — R531 Weakness: Secondary | ICD-10-CM | POA: Diagnosis present

## 2016-03-19 DIAGNOSIS — Z87891 Personal history of nicotine dependence: Secondary | ICD-10-CM | POA: Diagnosis not present

## 2016-03-19 LAB — URINALYSIS, ROUTINE W REFLEX MICROSCOPIC
Bilirubin Urine: NEGATIVE
GLUCOSE, UA: NEGATIVE mg/dL
HGB URINE DIPSTICK: NEGATIVE
Ketones, ur: NEGATIVE mg/dL
Leukocytes, UA: NEGATIVE
Nitrite: NEGATIVE
PH: 5.5 (ref 5.0–8.0)
PROTEIN: 100 mg/dL — AB
Specific Gravity, Urine: 1.019 (ref 1.005–1.030)

## 2016-03-19 LAB — BASIC METABOLIC PANEL
Anion gap: 10 (ref 5–15)
BUN: 55 mg/dL — AB (ref 6–20)
CHLORIDE: 108 mmol/L (ref 101–111)
CO2: 19 mmol/L — ABNORMAL LOW (ref 22–32)
CREATININE: 3.75 mg/dL — AB (ref 0.61–1.24)
Calcium: 9 mg/dL (ref 8.9–10.3)
GFR calc Af Amer: 15 mL/min — ABNORMAL LOW (ref >60)
GFR calc non Af Amer: 13 mL/min — ABNORMAL LOW (ref >60)
GLUCOSE: 114 mg/dL — AB (ref 65–99)
Potassium: 5.5 mmol/L — ABNORMAL HIGH (ref 3.5–5.1)
SODIUM: 137 mmol/L (ref 135–145)

## 2016-03-19 LAB — I-STAT CHEM 8, ED
BUN: 50 mg/dL — ABNORMAL HIGH (ref 6–20)
CALCIUM ION: 1.2 mmol/L (ref 1.13–1.30)
CREATININE: 3.8 mg/dL — AB (ref 0.61–1.24)
Chloride: 108 mmol/L (ref 101–111)
GLUCOSE: 85 mg/dL (ref 65–99)
HCT: 32 % — ABNORMAL LOW (ref 39.0–52.0)
HEMOGLOBIN: 10.9 g/dL — AB (ref 13.0–17.0)
POTASSIUM: 4.9 mmol/L (ref 3.5–5.1)
Sodium: 139 mmol/L (ref 135–145)
TCO2: 19 mmol/L (ref 0–100)

## 2016-03-19 LAB — CBG MONITORING, ED: Glucose-Capillary: 79 mg/dL (ref 65–99)

## 2016-03-19 LAB — CBC
HCT: 30.3 % — ABNORMAL LOW (ref 39.0–52.0)
Hemoglobin: 9.8 g/dL — ABNORMAL LOW (ref 13.0–17.0)
MCH: 29.8 pg (ref 26.0–34.0)
MCHC: 32.3 g/dL (ref 30.0–36.0)
MCV: 92.1 fL (ref 78.0–100.0)
PLATELETS: 204 10*3/uL (ref 150–400)
RBC: 3.29 MIL/uL — ABNORMAL LOW (ref 4.22–5.81)
RDW: 12.5 % (ref 11.5–15.5)
WBC: 6 10*3/uL (ref 4.0–10.5)

## 2016-03-19 LAB — URINE MICROSCOPIC-ADD ON: RBC / HPF: NONE SEEN RBC/hpf (ref 0–5)

## 2016-03-19 MED ORDER — SODIUM CHLORIDE 0.9 % IV BOLUS (SEPSIS)
1000.0000 mL | Freq: Once | INTRAVENOUS | Status: AC
  Administered 2016-03-19: 1000 mL via INTRAVENOUS

## 2016-03-19 NOTE — ED Provider Notes (Signed)
CSN: [id number]     Arrival date & time 03/19/16  1057 History   First MD Initiated Contact with Patient 03/19/16 1603     Chief Complaint  Patient presents with  . Dizziness  . Weakness    HPI  Patient presents with concern of increasing unsteadiness, frequent near falls. Patient states that in particular over the past 2 or 3 days he has had multiple episodes of Falling, due to gait instability. Symptoms are worse with attempts at ambulation. There is no focal weakness, no confusion, disorientation. No vomiting, nausea, belly pain, chest pain, dyspnea. Patient acknowledges a history of renal dysfunction, is not currently on dialysis, though he has had discussions about this with his physician.   Past Medical History  Diagnosis Date  . Adenomatous polyps   . GERD (gastroesophageal reflux disease)   . Hypothyroidism   . Hypertension   . Frequent urination at night   . Polyclonal gammopathy determined by serum protein electrophoresis 10/04/2015   Past Surgical History  Procedure Laterality Date  . Skin surgery      back  . Renal biopsy Left 10/06/2015   Family History  Problem Relation Age of Onset  . Diabetes Father   . Diabetes Brother   . Heart disease Brother   . Colon cancer Neg Hx   . Rectal cancer Neg Hx    Social History  Substance Use Topics  . Smoking status: Former Smoker    Quit date: 01/25/1980  . Smokeless tobacco: Never Used  . Alcohol Use: No     Comment: former alcoholic 32 years ago was last drink    Review of Systems  Constitutional:       Per HPI, otherwise negative  HENT:       Per HPI, otherwise negative  Respiratory:       Per HPI, otherwise negative  Cardiovascular:       Per HPI, otherwise negative  Gastrointestinal: Negative for vomiting.  Endocrine:       Negative aside from HPI  Genitourinary:       Neg aside from HPI   Musculoskeletal:       Per HPI, otherwise negative  Skin: Negative for wound.  Neurological: Positive for  dizziness, weakness and light-headedness. Negative for syncope.      Allergies  Review of patient's allergies indicates no known allergies.  Home Medications   Prior to Admission medications   Medication Sig Start Date End Date Taking? Authorizing Provider  alum & mag hydroxide-simeth (MAALOX PLUS) 400-400-40 MG/5ML suspension Take 10 mLs by mouth every 6 (six) hours as needed for indigestion.    Historical Provider, MD  amLODipine (NORVASC) 5 MG tablet Take 1 tablet by mouth at bedtime.  03/23/14   Historical Provider, MD  buPROPion (WELLBUTRIN) 75 MG tablet Take 75 mg by mouth daily. 09/13/15   Historical Provider, MD  clotrimazole (LOTRIMIN) 1 % cream Apply 1 application topically 2 (two) times daily. Use of 7-10 days. Patient taking differently: Apply 1 application topically 2 (two) times daily as needed. Use of 7-10 days. 05/30/14   Angelica Ran, MD  finasteride (PROSCAR) 5 MG tablet Take 5 mg by mouth daily. 07/16/15   Historical Provider, MD  levothyroxine (SYNTHROID, LEVOTHROID) 88 MCG tablet Take 88 mcg by mouth at bedtime.    Historical Provider, MD  polyvinyl alcohol (LIQUIFILM TEARS) 1.4 % ophthalmic solution Place 1-2 drops into both eyes as needed for dry eyes.    Historical Provider, MD  psyllium (  METAMUCIL) 58.6 % powder Take 1 packet by mouth at bedtime. Tablespoon and a half.    Historical Provider, MD  ranitidine (ZANTAC) 150 MG capsule Take 150 mg by mouth 2 (two) times daily.    Historical Provider, MD   BP 147/53 mmHg  Pulse 55  Temp(Src) 97.9 F (36.6 C) (Oral)  Resp 17  Ht 5\' 8"  (1.727 m)  Wt 171 lb (77.565 kg)  BMI 26.01 kg/m2  SpO2 100% Physical Exam  Constitutional: He is oriented to person, place, and time. He has a sickly appearance. No distress.  HENT:  Head: Normocephalic and atraumatic.  Eyes: Conjunctivae and EOM are normal.  Cardiovascular: Normal rate and regular rhythm.   Pulmonary/Chest: Effort normal. No stridor. No respiratory distress.   Abdominal: He exhibits no distension.    Musculoskeletal: He exhibits no edema.  Neurological: He is alert and oriented to person, place, and time. He displays no tremor. He exhibits normal muscle tone. He displays no seizure activity.  Skin: Skin is warm and dry.  Psychiatric: He has a normal mood and affect.  Nursing note and vitals reviewed.   ED Course  Procedures (including critical care time) Labs Review Labs Reviewed  BASIC METABOLIC PANEL - Abnormal; Notable for the following:    Potassium 5.5 (*)    CO2 19 (*)    Glucose, Bld 114 (*)    BUN 55 (*)    Creatinine, Ser 3.75 (*)    GFR calc non Af Amer 13 (*)    GFR calc Af Amer 15 (*)    All other components within normal limits  CBC - Abnormal; Notable for the following:    RBC 3.29 (*)    Hemoglobin 9.8 (*)    HCT 30.3 (*)    All other components within normal limits  URINALYSIS, ROUTINE W REFLEX MICROSCOPIC (NOT AT [facility])  CBG MONITORING, ED    I have personally reviewed and evaluated these images and lab results as part of my medical decision-making.   EKG Interpretation   Date/Time:  Monday March 19 2016 11:19:57 EDT Ventricular Rate:  64 PR Interval:  214 QRS Duration: 116 QT Interval:  436 QTC Calculation: 449 R Axis:   10 Text Interpretation:  Sinus rhythm with 1st degree A-V block with  occasional Premature ventricular complexes Incomplete right bundle branch  block Septal infarct , age undetermined Abnormal ECG Sinus rhythm w pauses  Premature ventricular complexes hypertrophic changes Abnormal ekg  Confirmed by Carmin Muskrat  MD [phone]) on 03/19/2016 4:06:32 PM     Patient solution labs notable for hyperkalemia, worsening renal dysfunction. With his potassium of 5.5, creatinine 3.75, patient received IV fluids.  8:55 PM Following IVF resuscitation, patient is sitting upright, no ongoing complaints. Repeat labs notable for normalization of potassium. We had a lengthy conversation about the  patient's progressive renal dysfunction. I advocated admission, patient has strong preference for discharge, with outpatient nephrology follow-up.  MDM  Patient presents with generalized concerns, is found to have progression of his chronic kidney disease. Patient was also found to be hyperkalemic. With fluid resuscitation the patient improved, and his potassium level normalized. Though I advocated for inpatient evaluation, management, the patient requested discharge with close outpatient follow-up. Given his previously diagnosed chronic kidney disease, and his ability to follow up with nephrology, this was accommodated.   Carmin Muskrat, MD 03/19/16 [phone]

## 2016-03-19 NOTE — ED Notes (Signed)
Pt c/o intermittent dizziness and weakness onset 1 month ago. Pt reports yesterday after dinner while walking to the car, pt went to step off the curb and felt like he was going to fall. Today pt woke up with dizziness and weakness.

## 2016-03-19 NOTE — ED Notes (Signed)
Report to [place] nurse BRENDA RN, [employer]

## 2016-03-19 NOTE — ED Notes (Signed)
Pt states he has been feeling dizzy "for awhile now," but last night as he was leaving a restaurant, he felt more dizzy than usual and felt that he was going to fall. Pt denies falling, but said he awakened this morning still feeling dizzy and weaker than usual. Pt has a history of CKD, stage IV, and last saw his nephrologist on March 13th.

## 2016-03-19 NOTE — Discharge Instructions (Signed)
As discussed, it is important that you follow up as soon as possible with your physician for continued management of your condition. ° °If you develop any new, or concerning changes in your condition, please return to the emergency department immediately. ° °

## 2016-04-03 ENCOUNTER — Other Ambulatory Visit (HOSPITAL_BASED_OUTPATIENT_CLINIC_OR_DEPARTMENT_OTHER): Payer: Medicare Other

## 2016-04-03 DIAGNOSIS — D890 Polyclonal hypergammaglobulinemia: Secondary | ICD-10-CM | POA: Diagnosis not present

## 2016-04-03 LAB — COMPREHENSIVE METABOLIC PANEL
ALT: 9 U/L (ref 0–55)
AST: 14 U/L (ref 5–34)
Albumin: 3.8 g/dL (ref 3.5–5.0)
Alkaline Phosphatase: 101 U/L (ref 40–150)
Anion Gap: 7 mEq/L (ref 3–11)
BUN: 51.6 mg/dL — ABNORMAL HIGH (ref 7.0–26.0)
CHLORIDE: 107 meq/L (ref 98–109)
CO2: 21 meq/L — AB (ref 22–29)
Calcium: 8.9 mg/dL (ref 8.4–10.4)
Creatinine: 3.7 mg/dL (ref 0.7–1.3)
EGFR: 14 mL/min/{1.73_m2} — AB (ref >90)
GLUCOSE: 125 mg/dL (ref 70–140)
POTASSIUM: 5 meq/L (ref 3.5–5.1)
SODIUM: 135 meq/L — AB (ref 136–145)
Total Bilirubin: 0.42 mg/dL (ref 0.20–1.20)
Total Protein: 7.1 g/dL (ref 6.4–8.3)

## 2016-04-03 LAB — CBC WITH DIFFERENTIAL/PLATELET
BASO%: 1 % (ref 0.0–2.0)
BASOS ABS: 0.1 10*3/uL (ref 0.0–0.1)
EOS ABS: 0.4 10*3/uL (ref 0.0–0.5)
EOS%: 6.5 % (ref 0.0–7.0)
HCT: 31.2 % — ABNORMAL LOW (ref 38.4–49.9)
HGB: 10.1 g/dL — ABNORMAL LOW (ref 13.0–17.1)
LYMPH%: 15.3 % (ref 14.0–49.0)
MCH: 30.2 pg (ref 27.2–33.4)
MCHC: 32.5 g/dL (ref 32.0–36.0)
MCV: 92.8 fL (ref 79.3–98.0)
MONO#: 0.4 10*3/uL (ref 0.1–0.9)
MONO%: 6.7 % (ref 0.0–14.0)
NEUT#: 3.9 10*3/uL (ref 1.5–6.5)
NEUT%: 70.5 % (ref 39.0–75.0)
Platelets: 201 10*3/uL (ref 140–400)
RBC: 3.36 10*6/uL — AB (ref 4.20–5.82)
RDW: 12.7 % (ref 11.0–14.6)
WBC: 5.6 10*3/uL (ref 4.0–10.3)
lymph#: 0.9 10*3/uL (ref 0.9–3.3)

## 2016-04-03 LAB — LACTATE DEHYDROGENASE: LDH: 140 U/L (ref 125–245)

## 2016-04-04 LAB — KAPPA/LAMBDA LIGHT CHAINS
IG LAMBDA FREE LIGHT CHAIN: 70.86 mg/L — AB (ref 5.71–26.30)
Ig Kappa Free Light Chain: 122.26 mg/L — ABNORMAL HIGH (ref 3.30–19.40)
Kappa/Lambda FluidC Ratio: 1.73 — ABNORMAL HIGH (ref 0.26–1.65)

## 2016-04-04 LAB — BETA 2 MICROGLOBULIN, SERUM: Beta-2: 7.5 mg/L — ABNORMAL HIGH (ref 0.6–2.4)

## 2016-04-04 LAB — IGG, IGA, IGM
IGA/IMMUNOGLOBULIN A, SERUM: 265 mg/dL (ref 61–437)
IGM (IMMUNOGLOBIN M), SRM: 63 mg/dL (ref 15–143)

## 2016-04-10 ENCOUNTER — Encounter: Payer: Self-pay | Admitting: Internal Medicine

## 2016-04-10 ENCOUNTER — Telehealth: Payer: Self-pay | Admitting: Internal Medicine

## 2016-04-10 ENCOUNTER — Ambulatory Visit (HOSPITAL_BASED_OUTPATIENT_CLINIC_OR_DEPARTMENT_OTHER): Payer: Medicare Other | Admitting: Internal Medicine

## 2016-04-10 VITALS — BP 114/71 | HR 66 | Temp 97.9°F | Resp 18 | Ht 68.0 in | Wt 169.9 lb

## 2016-04-10 DIAGNOSIS — D890 Polyclonal hypergammaglobulinemia: Secondary | ICD-10-CM | POA: Diagnosis not present

## 2016-04-10 DIAGNOSIS — D649 Anemia, unspecified: Secondary | ICD-10-CM | POA: Diagnosis not present

## 2016-04-10 DIAGNOSIS — R5383 Other fatigue: Secondary | ICD-10-CM

## 2016-04-10 NOTE — Progress Notes (Signed)
[facility] [phone]   Fax:([phone]) [place], [place] [address]  DIAGNOSIS: Polyclonal gammopathy of undetermined significance.  PRIOR THERAPY: None  CURRENT THERAPY: Observation  INTERVAL HISTORY: Lonnie [name] 82 y.o. male returns to the clinic today for follow-up visit. He denied having any significant complaints today. He has no fever or chills, no nausea or vomiting. The patient denied having any significant weight loss or night sweats. The patient denied having any chest pain, shortness of breath, cough or hemoptysis. The patient is followed by his nephrologist Dr. Justin Mend for his renal insufficiency. The patient had repeat myeloma panel performed recently and he is here for evaluation and discussion of his lab results.  MEDICAL HISTORY: Past Medical History  Diagnosis Date  . Adenomatous polyps   . GERD (gastroesophageal reflux disease)   . Hypothyroidism   . Hypertension   . Frequent urination at night   . Polyclonal gammopathy determined by serum protein electrophoresis 10/04/2015    ALLERGIES:  has No Known Allergies.  MEDICATIONS:  Current Outpatient Prescriptions  Medication Sig Dispense Refill  . amLODipine (NORVASC) 5 MG tablet Take 1 tablet by mouth at bedtime.     [name] buPROPion (WELLBUTRIN) 75 MG tablet Take 75 mg by mouth daily.    . clotrimazole (LOTRIMIN) 1 % cream Apply 1 application topically 2 (two) times daily. Use of 7-10 days. (Patient taking differently: Apply 1 application topically 2 (two) times daily as needed. Use of 7-10 days.) 30 g 0  . finasteride (PROSCAR) 5 MG tablet Take 5 mg by mouth daily.    [name] levothyroxine (SYNTHROID, LEVOTHROID) 88 MCG tablet Take 88 mcg by mouth at bedtime.    . polyvinyl alcohol (LIQUIFILM TEARS) 1.4 % ophthalmic solution Place 1-2 drops into both eyes as needed for dry eyes.    [name] psyllium (METAMUCIL) 58.6 % powder Take 1  packet by mouth at bedtime. Tablespoon and a half.    . tamsulosin (FLOMAX) 0.4 MG CAPS capsule Take 1 capsule by mouth daily.    . ranitidine (ZANTAC) 150 MG capsule Take 150 mg by mouth 2 (two) times daily.     No current facility-administered medications for this visit.    SURGICAL HISTORY:  Past Surgical History  Procedure Laterality Date  . Skin surgery      back  . Renal biopsy Left 10/06/2015    REVIEW OF SYSTEMS:  A comprehensive review of systems was negative except for: Constitutional: positive for fatigue   PHYSICAL EXAMINATION: General appearance: alert, cooperative, fatigued and no distress Head: Normocephalic, without obvious abnormality, atraumatic Neck: no adenopathy, no JVD, supple, symmetrical, trachea midline and thyroid not enlarged, symmetric, no tenderness/mass/nodules Lymph nodes: Cervical, supraclavicular, and axillary nodes normal. Resp: clear to auscultation bilaterally Back: symmetric, no curvature. ROM normal. No CVA tenderness. Cardio: regular rate and rhythm, S1, S2 normal, no murmur, click, rub or gallop GI: soft, non-tender; bowel sounds normal; no masses,  no organomegaly Extremities: extremities normal, atraumatic, no cyanosis or edema  ECOG PERFORMANCE STATUS: 1 - Symptomatic but completely ambulatory  Blood pressure 114/71, pulse 66, temperature 97.9 F (36.6 C), temperature source Oral, resp. rate 18, height 5' 8" (1.727 m), weight 169 lb 14.4 oz (77.066 kg), SpO2 100 %.  LABORATORY DATA: Lab Results  Component Value Date   WBC 5.6 04/03/2016   HGB 10.1* 04/03/2016   HCT 31.2* 04/03/2016   MCV 92.8 04/03/2016  PLT 201 04/03/2016      Chemistry      Component Value Date/Time   NA 135* 04/03/2016 1100   NA 139 03/19/2016 2045   K 5.0 04/03/2016 1100   K 4.9 03/19/2016 2045   CL 108 03/19/2016 2045   CO2 21* 04/03/2016 1100   CO2 19* 03/19/2016 1140   BUN 51.6* 04/03/2016 1100   BUN 50* 03/19/2016 2045   CREATININE 3.7*  04/03/2016 1100   CREATININE 3.80* 03/19/2016 2045      Component Value Date/Time   CALCIUM 8.9 04/03/2016 1100   CALCIUM 9.0 03/19/2016 1140   ALKPHOS 101 04/03/2016 1100   AST 14 04/03/2016 1100   ALT 9 04/03/2016 1100   BILITOT 0.42 04/03/2016 1100     Myeloma panel: Beta-2 microglobulin 7.5, free kappa light chain 122.26, free lambda light chain 70.86 with a kappa/lambda ratio 1.73. IgG 11870, IgA 265 and IgM 63  RADIOGRAPHIC STUDIES: No results found.  ASSESSMENT AND PLAN: This is a very pleasant 82 years old white male with polyclonal gammopathy.  His previous bone marrow biopsy and aspirate that showed 3% plasma cells and a 24 hour urine protein electrophoreses showed no significant monoclonal bands. These findings are reassuring that the patient has no concerning evidence for multiple myeloma. The recent myeloma panel showed no Concerning findings for disease progression. I would see him back for follow-up visit with repeat myeloma panel in one year. For the fatigue and mild anemia, I advised the patient to continue taking over-the-counter oral iron tablets with vitamin C. The patient was advised to call immediately if he has any concerning symptoms in the interval. The patient voices understanding of current disease status and treatment options and is in agreement with the current care plan.  All questions were answered. The patient knows to call the clinic with any problems, questions or concerns. We can certainly see the patient much sooner if necessary.  Disclaimer: This note was dictated with voice recognition software. Similar sounding words can inadvertently be transcribed and may not be corrected upon review.

## 2016-04-10 NOTE — Telephone Encounter (Signed)
Gave and printed appt shced and avs for pt for may 2018

## 2016-05-17 DIAGNOSIS — N2581 Secondary hyperparathyroidism of renal origin: Secondary | ICD-10-CM | POA: Diagnosis not present

## 2016-05-17 DIAGNOSIS — D472 Monoclonal gammopathy: Secondary | ICD-10-CM | POA: Diagnosis not present

## 2016-05-17 DIAGNOSIS — D631 Anemia in chronic kidney disease: Secondary | ICD-10-CM | POA: Diagnosis not present

## 2016-05-17 DIAGNOSIS — R809 Proteinuria, unspecified: Secondary | ICD-10-CM | POA: Diagnosis not present

## 2016-05-17 DIAGNOSIS — N184 Chronic kidney disease, stage 4 (severe): Secondary | ICD-10-CM | POA: Diagnosis not present

## 2016-05-17 DIAGNOSIS — R531 Weakness: Secondary | ICD-10-CM | POA: Diagnosis not present

## 2016-05-17 DIAGNOSIS — E039 Hypothyroidism, unspecified: Secondary | ICD-10-CM | POA: Diagnosis not present

## 2016-05-17 DIAGNOSIS — N189 Chronic kidney disease, unspecified: Secondary | ICD-10-CM | POA: Diagnosis not present

## 2016-05-21 ENCOUNTER — Other Ambulatory Visit: Payer: Self-pay | Admitting: *Deleted

## 2016-05-21 DIAGNOSIS — N185 Chronic kidney disease, stage 5: Secondary | ICD-10-CM

## 2016-05-21 DIAGNOSIS — Z01810 Encounter for preprocedural cardiovascular examination: Secondary | ICD-10-CM

## 2016-06-29 ENCOUNTER — Encounter: Payer: Self-pay | Admitting: Vascular Surgery

## 2016-07-02 DIAGNOSIS — M25552 Pain in left hip: Secondary | ICD-10-CM | POA: Diagnosis not present

## 2016-07-02 DIAGNOSIS — M25112 Fistula, left shoulder: Secondary | ICD-10-CM | POA: Diagnosis not present

## 2016-07-05 ENCOUNTER — Ambulatory Visit (INDEPENDENT_AMBULATORY_CARE_PROVIDER_SITE_OTHER): Payer: Medicare Other | Admitting: Vascular Surgery

## 2016-07-05 ENCOUNTER — Encounter: Payer: Self-pay | Admitting: Vascular Surgery

## 2016-07-05 ENCOUNTER — Other Ambulatory Visit: Payer: Self-pay

## 2016-07-05 ENCOUNTER — Ambulatory Visit (INDEPENDENT_AMBULATORY_CARE_PROVIDER_SITE_OTHER)
Admission: RE | Admit: 2016-07-05 | Discharge: 2016-07-05 | Disposition: A | Discharge status: Home / Self Care | Payer: Medicare Other | Source: Ambulatory Visit | Attending: Vascular Surgery | Admitting: Vascular Surgery

## 2016-07-05 ENCOUNTER — Ambulatory Visit (HOSPITAL_COMMUNITY)
Admission: RE | Admit: 2016-07-05 | Discharge: 2016-07-05 | Disposition: A | Discharge status: Home / Self Care | Payer: Medicare Other | Source: Ambulatory Visit | Attending: Vascular Surgery | Admitting: Vascular Surgery

## 2016-07-05 VITALS — BP 134/60 | HR 61 | Temp 97.1°F | Resp 20 | Ht 68.0 in | Wt 166.0 lb

## 2016-07-05 DIAGNOSIS — I120 Hypertensive chronic kidney disease with stage 5 chronic kidney disease or end stage renal disease: Secondary | ICD-10-CM | POA: Insufficient documentation

## 2016-07-05 DIAGNOSIS — N185 Chronic kidney disease, stage 5: Secondary | ICD-10-CM

## 2016-07-05 DIAGNOSIS — Z01810 Encounter for preprocedural cardiovascular examination: Secondary | ICD-10-CM | POA: Diagnosis not present

## 2016-07-05 DIAGNOSIS — E039 Hypothyroidism, unspecified: Secondary | ICD-10-CM | POA: Diagnosis not present

## 2016-07-05 DIAGNOSIS — N184 Chronic kidney disease, stage 4 (severe): Secondary | ICD-10-CM

## 2016-07-05 DIAGNOSIS — K219 Gastro-esophageal reflux disease without esophagitis: Secondary | ICD-10-CM | POA: Diagnosis not present

## 2016-07-05 NOTE — Progress Notes (Addendum)
HISTORY AND PHYSICAL     CC:  In need of permanent HD access Referring Provider:  Edrick Oh MD HPI: This is a 82 y.o. male who presents today for permanent HD access.  He states that he has CKD 4 and his kidneys function at 17%.  He returns to see Dr. Justin Mend in September.    He states he has not had any heart issues or chest pain.  He does have HTN and takes a CCB for this.  He is on synthroid for hypothyroidism.  He denies having stroke.  The only surgery he has had was removal of a carbuncle from his back.  He is not on anticoagulation.  Past Medical History:  Diagnosis Date  . Adenomatous polyps   . Frequent urination at night   . GERD (gastroesophageal reflux disease)   . Hypertension   . Hypothyroidism   . Polyclonal gammopathy determined by serum protein electrophoresis 10/04/2015    Past Surgical History:  Procedure Laterality Date  . RENAL BIOPSY Left 10/06/2015  . SKIN SURGERY     back    No Known Allergies  Current Outpatient Prescriptions  Medication Sig Dispense Refill  . amLODipine (NORVASC) 5 MG tablet Take 1 tablet by mouth at bedtime.     [name] buPROPion (WELLBUTRIN) 75 MG tablet Take 75 mg by mouth daily.    . clotrimazole (LOTRIMIN) 1 % cream Apply 1 application topically 2 (two) times daily. Use of 7-10 days. (Patient taking differently: Apply 1 application topically 2 (two) times daily as needed. Use of 7-10 days.) 30 g 0  . finasteride (PROSCAR) 5 MG tablet Take 5 mg by mouth daily.    [name] levothyroxine (SYNTHROID, LEVOTHROID) 88 MCG tablet Take 88 mcg by mouth at bedtime.    . polyvinyl alcohol (LIQUIFILM TEARS) 1.4 % ophthalmic solution Place 1-2 drops into both eyes as needed for dry eyes.    [name] psyllium (METAMUCIL) 58.6 % powder Take 1 packet by mouth at bedtime. Tablespoon and a half.    . ranitidine (ZANTAC) 150 MG capsule Take 150 mg by mouth 2 (two) times daily.    . tamsulosin (FLOMAX) 0.4 MG CAPS capsule Take 1 capsule by mouth daily.     No current  facility-administered medications for this visit.     Family History  Problem Relation Age of Onset  . Diabetes Father   . Diabetes Brother   . Heart disease Brother   . Colon cancer Neg Hx   . Rectal cancer Neg Hx     Social History   Social History  . Marital status: Widowed    Spouse name: N/A  . Number of children: N/A  . Years of education: N/A   Occupational History  . Not on file.   Social History Main Topics  . Smoking status: Former Smoker    Quit date: 01/25/1980  . Smokeless tobacco: Never Used  . Alcohol use No     Comment: former alcoholic 32 years ago was last drink  . Drug use: No  . Sexual activity: Not on file   Other Topics Concern  . Not on file   Social History Narrative  . No narrative on file     REVIEW OF SYSTEMS:   [X]  denotes positive finding, [ ]  denotes negative finding Cardiac  Comments:  Chest pain or chest pressure:    Shortness of breath upon exertion:    Short of breath when lying flat:    Irregular heart rhythm:  Vascular    Pain in calf, thigh, or hip brought on by ambulation:    Pain in feet at night that wakes you up from your sleep:     Blood clot in your veins:    Leg swelling:         Pulmonary    Oxygen at home:    Productive cough:     Wheezing:         Neurologic    Sudden weakness in arms or legs:     Sudden numbness in arms or legs:     Sudden onset of difficulty speaking or slurred speech:    Temporary loss of vision in one eye:     Problems with dizziness:         Gastrointestinal    Blood in stool:     Vomited blood:         Genitourinary    Burning when urinating:     Blood in urine:        Psychiatric    Major depression:         Hematologic    Bleeding problems:    Problems with blood clotting too easily:        Skin    Rashes or ulcers:        Constitutional    Fever or chills:      PHYSICAL EXAMINATION:  Vitals:   07/05/16 1452  BP: 134/60  Pulse: 61  Resp: 20    Temp: 97.1 F (36.2 C)   Body mass index is 25.24 kg/m.  General:  WDWN in NAD; vital signs documented above Gait: Not observed HENT: WNL, normocephalic Pulmonary: normal non-labored breathing , without Rales, rhonchi,  wheezing Cardiac: regular HR, without  Murmurs, rubs or gallops; without carotid bruits Abdomen: soft, NT, no masses Skin: without rashes Vascular Exam/Pulses:  Right Left  Radial 2+ (normal) 2+ (normal)  Ulnar 1+ (weak) 1+ (weak)  PT 1+ (weak) 1+ (weak)   Extremities: without ischemic changes, without Gangrene , without cellulitis; without open wounds; left brachiocephalic  Musculoskeletal: no muscle wasting or atrophy  Neurologic: A&O X 3;  No focal weakness or paresthesias are detected Psychiatric:  The pt has Normal affect.   Non-Invasive Vascular Imaging:   Upper extremity vein mapping duplex 0000000: Right Cephalic:  [id number] Right Basilic:  NV  Left Cephalic:  [id number] Left Basilic:  [id number]  Upper extremity arterial duplex evaluation 07/05/16: Patent bilateral upper extremity arteries with no evidence of hemodynamically significant stenosis noted.  Waveforms are triphasic. Right  brachial:  0.52 cm (T) Radial:  0.27cm (T) Ulnar:  0.20cm (T)  Left:   Brachial:  0.52cm (T) Radial:  0.28cm (T) Ulnar:  0.18cm (T)   Pt meds includes: Statin:  No. Beta Blocker:  No. Aspirin:  No. ACEI:  No. ARB:  No. Other Antiplatelet/Anticoagulant:  No.    ASSESSMENT/PLAN:: 82 y.o. male with CKD 4 in need of permanent HD access   -pt is right hand dominant and has adequate vein for a left brachiocephalic AVF -Dr. Oneida Alar discussed the risks and benefits of the procedure and the pt wishes to proceed. -will schedule for July 10, 2016   Leontine Locket, [place] [facility] [phone]  Clinic MD:  Pt seen and examined in conjunction with Dr. Oneida Alar  History and exam findings as above patient has adequate vein for a  left brachiocephalic AV fistula. Risks benefits possible complications and procedure details were discussed the patient today including  not limited to bleeding infection ischemic steal non-maturation of fistula he understands and agrees to proceed.  Ruta Hinds, MD [facility] [place] Office: [phone] Pager: [phone]

## 2016-07-06 ENCOUNTER — Encounter (HOSPITAL_COMMUNITY): Payer: Self-pay | Admitting: *Deleted

## 2016-07-06 NOTE — Progress Notes (Signed)
Pt denies cardiac history, chest pain or sob. 

## 2016-07-10 ENCOUNTER — Encounter (HOSPITAL_COMMUNITY)
Admission: RE | Disposition: A | Discharge status: Home / Self Care | Payer: Self-pay | Source: Ambulatory Visit | Attending: Vascular Surgery

## 2016-07-10 ENCOUNTER — Ambulatory Visit (HOSPITAL_COMMUNITY)
Admission: RE | Admit: 2016-07-10 | Discharge: 2016-07-10 | Disposition: A | Discharge status: Home / Self Care | Payer: Medicare Other | Source: Ambulatory Visit | Attending: Vascular Surgery | Admitting: Vascular Surgery

## 2016-07-10 ENCOUNTER — Ambulatory Visit (HOSPITAL_COMMUNITY): Payer: Medicare Other | Admitting: Anesthesiology

## 2016-07-10 ENCOUNTER — Encounter (HOSPITAL_COMMUNITY): Payer: Self-pay | Admitting: Anesthesiology

## 2016-07-10 DIAGNOSIS — Z87891 Personal history of nicotine dependence: Secondary | ICD-10-CM | POA: Diagnosis not present

## 2016-07-10 DIAGNOSIS — N184 Chronic kidney disease, stage 4 (severe): Secondary | ICD-10-CM | POA: Insufficient documentation

## 2016-07-10 DIAGNOSIS — K219 Gastro-esophageal reflux disease without esophagitis: Secondary | ICD-10-CM | POA: Diagnosis not present

## 2016-07-10 DIAGNOSIS — F329 Major depressive disorder, single episode, unspecified: Secondary | ICD-10-CM | POA: Diagnosis not present

## 2016-07-10 DIAGNOSIS — I129 Hypertensive chronic kidney disease with stage 1 through stage 4 chronic kidney disease, or unspecified chronic kidney disease: Secondary | ICD-10-CM | POA: Diagnosis not present

## 2016-07-10 DIAGNOSIS — E039 Hypothyroidism, unspecified: Secondary | ICD-10-CM | POA: Diagnosis not present

## 2016-07-10 HISTORY — PX: AV FISTULA PLACEMENT: SHX1204

## 2016-07-10 HISTORY — DX: Bursopathy, unspecified: M71.9

## 2016-07-10 HISTORY — DX: Chronic kidney disease, unspecified: N18.9

## 2016-07-10 HISTORY — DX: Pneumonia, unspecified organism: J18.9

## 2016-07-10 LAB — POCT I-STAT 4, (NA,K, GLUC, HGB,HCT)
Glucose, Bld: 95 mg/dL (ref 65–99)
HEMATOCRIT: 34 % — AB (ref 39.0–52.0)
Hemoglobin: 11.6 g/dL — ABNORMAL LOW (ref 13.0–17.0)
Potassium: 4.9 mmol/L (ref 3.5–5.1)
Sodium: 139 mmol/L (ref 135–145)

## 2016-07-10 SURGERY — ARTERIOVENOUS (AV) FISTULA CREATION
Anesthesia: Monitor Anesthesia Care | Site: Arm Upper | Laterality: Left

## 2016-07-10 MED ORDER — PROPOFOL 10 MG/ML IV BOLUS
INTRAVENOUS | Status: AC
  Filled 2016-07-10: qty 20

## 2016-07-10 MED ORDER — DEXTROSE 5 % IV SOLN
1.5000 g | INTRAVENOUS | Status: AC
  Administered 2016-07-10: 1.5 g via INTRAVENOUS

## 2016-07-10 MED ORDER — PROMETHAZINE HCL 25 MG/ML IJ SOLN: 6.2500 mg | INTRAMUSCULAR | Status: DC | PRN

## 2016-07-10 MED ORDER — LIDOCAINE HCL (CARDIAC) 20 MG/ML IV SOLN
INTRAVENOUS | Status: DC | PRN
  Administered 2016-07-10: 20 mg via INTRATRACHEAL

## 2016-07-10 MED ORDER — LIDOCAINE HCL (PF) 1 % IJ SOLN
INTRAMUSCULAR | Status: DC | PRN
  Administered 2016-07-10: 2 mL via INTRADERMAL

## 2016-07-10 MED ORDER — DEXTROSE 5 % IV SOLN
INTRAVENOUS | Status: AC
  Filled 2016-07-10: qty 1.5

## 2016-07-10 MED ORDER — 0.9 % SODIUM CHLORIDE (POUR BTL) OPTIME
TOPICAL | Status: DC | PRN
  Administered 2016-07-10: 1000 mL

## 2016-07-10 MED ORDER — PROPOFOL 500 MG/50ML IV EMUL
INTRAVENOUS | Status: AC
  Filled 2016-07-10: qty 50

## 2016-07-10 MED ORDER — LIDOCAINE HCL (PF) 1 % IJ SOLN
INTRAMUSCULAR | Status: AC
  Filled 2016-07-10: qty 30

## 2016-07-10 MED ORDER — MIDAZOLAM HCL 2 MG/2ML IJ SOLN
INTRAMUSCULAR | Status: AC
  Filled 2016-07-10: qty 2

## 2016-07-10 MED ORDER — CHLORHEXIDINE GLUCONATE CLOTH 2 % EX PADS: 6.0000 | MEDICATED_PAD | Freq: Once | CUTANEOUS | Status: DC

## 2016-07-10 MED ORDER — SODIUM CHLORIDE 0.9 % IV SOLN
INTRAVENOUS | Status: DC | PRN
  Administered 2016-07-10: 500 mL

## 2016-07-10 MED ORDER — SODIUM CHLORIDE 0.9 % IV SOLN: INTRAVENOUS | Status: DC

## 2016-07-10 MED ORDER — OXYCODONE-ACETAMINOPHEN 5-325 MG PO TABS: 1.0000 | ORAL_TABLET | Freq: Four times a day (QID) | ORAL | 0 refills | Status: DC | PRN

## 2016-07-10 MED ORDER — FENTANYL CITRATE (PF) 250 MCG/5ML IJ SOLN
INTRAMUSCULAR | Status: AC
  Filled 2016-07-10: qty 5

## 2016-07-10 MED ORDER — FENTANYL CITRATE (PF) 100 MCG/2ML IJ SOLN: 25.0000 ug | INTRAMUSCULAR | Status: DC | PRN

## 2016-07-10 MED ORDER — MIDAZOLAM HCL 5 MG/5ML IJ SOLN
INTRAMUSCULAR | Status: DC | PRN
  Administered 2016-07-10 (×2): 1 mg via INTRAVENOUS

## 2016-07-10 MED ORDER — PROPOFOL 500 MG/50ML IV EMUL
INTRAVENOUS | Status: DC | PRN
  Administered 2016-07-10: 50 ug/kg/min via INTRAVENOUS

## 2016-07-10 MED ORDER — MEPERIDINE HCL 25 MG/ML IJ SOLN: 6.2500 mg | INTRAMUSCULAR | Status: DC | PRN

## 2016-07-10 MED ORDER — SODIUM CHLORIDE 0.9 % IV SOLN
INTRAVENOUS | Status: DC
  Administered 2016-07-10 (×2): via INTRAVENOUS

## 2016-07-10 MED ORDER — PROPOFOL 1000 MG/100ML IV EMUL
INTRAVENOUS | Status: AC
  Filled 2016-07-10: qty 100

## 2016-07-10 MED ORDER — FENTANYL CITRATE (PF) 100 MCG/2ML IJ SOLN
INTRAMUSCULAR | Status: DC | PRN
  Administered 2016-07-10: 50 ug via INTRAVENOUS

## 2016-07-10 SURGICAL SUPPLY — 35 items
ARMBAND PINK RESTRICT EXTREMIT (MISCELLANEOUS) ×3 IMPLANT
CANISTER SUCTION 2500CC (MISCELLANEOUS) ×3 IMPLANT
CANNULA VESSEL 3MM 2 BLNT TIP (CANNULA) IMPLANT
CLIP TI MEDIUM 6 (CLIP) ×3 IMPLANT
CLIP TI WIDE RED SMALL 6 (CLIP) ×3 IMPLANT
COVER PROBE W GEL 5X96 (DRAPES) IMPLANT
DECANTER SPIKE VIAL GLASS SM (MISCELLANEOUS) IMPLANT
DRAIN PENROSE 1/4X12 LTX STRL (WOUND CARE) IMPLANT
ELECT REM PT RETURN 9FT ADLT (ELECTROSURGICAL) ×3
ELECTRODE REM PT RTRN 9FT ADLT (ELECTROSURGICAL) ×1 IMPLANT
GLOVE BIO SURGEON STRL SZ7 (GLOVE) ×3 IMPLANT
GLOVE BIOGEL PI IND STRL 6.5 (GLOVE) ×3 IMPLANT
GLOVE BIOGEL PI IND STRL 7.5 (GLOVE) ×1 IMPLANT
GLOVE BIOGEL PI INDICATOR 6.5 (GLOVE) ×6
GLOVE BIOGEL PI INDICATOR 7.5 (GLOVE) ×2
GLOVE ECLIPSE 6.5 STRL STRAW (GLOVE) ×3 IMPLANT
GLOVE SURG SS PI 7.0 STRL IVOR (GLOVE) ×3 IMPLANT
GOWN STRL REUS W/ TWL LRG LVL3 (GOWN DISPOSABLE) ×2 IMPLANT
GOWN STRL REUS W/TWL LRG LVL3 (GOWN DISPOSABLE) ×4
GOWN STRL REUS W/TWL XL LVL3 (GOWN DISPOSABLE) ×3 IMPLANT
KIT BASIN OR (CUSTOM PROCEDURE TRAY) ×3 IMPLANT
KIT ROOM TURNOVER OR (KITS) ×3 IMPLANT
LIQUID BAND (GAUZE/BANDAGES/DRESSINGS) ×3 IMPLANT
LOOP VESSEL MINI RED (MISCELLANEOUS) IMPLANT
NS IRRIG 1000ML POUR BTL (IV SOLUTION) ×3 IMPLANT
PACK CV ACCESS (CUSTOM PROCEDURE TRAY) ×3 IMPLANT
PAD ARMBOARD 7.5X6 YLW CONV (MISCELLANEOUS) ×6 IMPLANT
SPONGE SURGIFOAM ABS GEL 100 (HEMOSTASIS) IMPLANT
SUT MNCRL AB 4-0 PS2 18 (SUTURE) ×3 IMPLANT
SUT PROLENE 7 0 BV 1 (SUTURE) ×9 IMPLANT
SUT VIC AB 3-0 SH 27 (SUTURE) ×2
SUT VIC AB 3-0 SH 27X BRD (SUTURE) ×1 IMPLANT
SUT VICRYL 4-0 PS2 18IN ABS (SUTURE) IMPLANT
UNDERPAD 30X30 INCONTINENT (UNDERPADS AND DIAPERS) ×3 IMPLANT
WATER STERILE IRR 1000ML POUR (IV SOLUTION) IMPLANT

## 2016-07-10 NOTE — Op Note (Signed)
OPERATIVE NOTE   PROCEDURE: left brachiocephalic arteriovenous fistula placement  PRE-OPERATIVE DIAGNOSIS: chronic kidney disease stage IV   POST-OPERATIVE DIAGNOSIS: same as above   SURGEON: Adele Barthel, MD  ASSISTANT(S): RNFA  ANESTHESIA: local and MAC  ESTIMATED BLOOD LOSS: 50 cc  FINDING(S): 1.  Palpable thrill at end of case 2.  Palpable radial artery  SPECIMEN(S):  none  INDICATIONS:   Lonnie [name] is a 82 y.o. male who presents with chronic kidney disease stage IV.  The patient is scheduled for left brachiocephalic arteriovenous fistula placement.  The patient is aware the risks include but are not limited to: bleeding, infection, steal syndrome, nerve damage, ischemic monomelic neuropathy, failure to mature, and need for additional procedures.  The patient is aware of the risks of the procedure and elects to proceed forward.  DESCRIPTION: After full informed written consent was obtained from the patient, the patient was brought back to the operating room and placed supine upon the operating table.  Prior to induction, the patient received IV antibiotics.   After obtaining adequate anesthesia, the patient was then prepped and draped in the standard fashion for a left arm access procedure.  I turned my attention first to identifying the patient's cephalic vein and brachial artery.  Using SonoSite guidance, the location of these vessels were marked out on the skin.   At this point, I injected local anesthetic to obtain a field block of the antecubitum.  In total, I injected about 3 mL of 1% lidocaine without epinephrine.  I made a transverse incision at the level of the antecubitum and dissected through the subcutaneous tissue and fascia to gain exposure of the brachial artery.  This was noted to be 4 mm in diameter externally.  This was dissected out proximally and distally and controlled with vessel loops .  I then dissected out the cephalic vein.  This was noted to be  3-3.5 mm in diameter externally.  The distal segment of the vein was ligated with a  2-0 silk, and the vein was transected.  The proximal segment was interrogated with serial dilators.  The vein accepted up to a 4 mm dilator without any difficulty.  I then instilled the heparinized saline into the vein and clamped it.  At this point, I reset my exposure of the brachial artery and placed the artery under tension proximally and distally.  I made an arteriotomy with a #11 blade, and then I extended the arteriotomy with a Potts scissor.  I injected heparinized saline proximal and distal to this arteriotomy.  The vein was then sewn to the artery in an end-to-side configuration with a running stitch of 7-0 Prolene.  Prior to completing this anastomosis, I allowed the vein and artery to backbleed.  There was no evidence of clot from any vessels.  I completed the anastomosis in the usual fashion and then released all vessel loops and clamps.  There was a palpable  thrill in the venous outflow, and there was a palpable radial pulse.  At this point, I irrigated out the surgical wound.  There was no further active bleeding.  The subcutaneous tissue was reapproximated with a running stitch of 3-0 Vicryl.  The skin was then reapproximated with a running subcuticular stitch of 4-0 Vicryl.  The skin was then cleaned, dried, and reinforced with Dermabond.  The patient tolerated this procedure well.   COMPLICATIONS: none  CONDITION: stable   Adele Barthel, MD [facility] Office:  [phone] Pager: [phone]  07/10/2016, 1:46 PM

## 2016-07-10 NOTE — Interval H&P Note (Signed)
History and Physical Interval Note:  07/10/2016 11:57 AM  Lonnie [name]  has presented today for surgery, with the diagnosis of Stage IV Chronic Kidney Disease N18.4  The various methods of treatment have been discussed with the patient and family. After consideration of risks, benefits and other options for treatment, the patient has consented to  Procedure(s): BRACHIOCEPHALIC ARTERIOVENOUS (AV) FISTULA CREATION (Left) as a surgical intervention .  The patient's history has been reviewed, patient examined, no change in status, stable for surgery.  I have reviewed the patient's chart and labs.  Questions were answered to the patient's satisfaction.     Adele Barthel

## 2016-07-10 NOTE — Anesthesia Preprocedure Evaluation (Signed)
Anesthesia Evaluation  Patient identified by MRN, date of birth, ID band Patient awake    Reviewed: Allergy & Precautions, NPO status , Patient's Chart, lab work & pertinent test results  Airway Mallampati: II  TM Distance: >3 FB Neck ROM: Full    Dental no notable dental hx.    Pulmonary pneumonia, former smoker,    Pulmonary exam normal breath sounds clear to auscultation       Cardiovascular hypertension, Normal cardiovascular exam Rhythm:Regular Rate:Normal     Neuro/Psych PSYCHIATRIC DISORDERS Depression negative neurological ROS     GI/Hepatic Neg liver ROS, GERD  ,  Endo/Other  Hypothyroidism   Renal/GU Renal disease     Musculoskeletal negative musculoskeletal ROS (+)   Abdominal   Peds  Hematology negative hematology ROS (+)   Anesthesia Other Findings   Reproductive/Obstetrics negative OB ROS                            Anesthesia Physical Anesthesia Plan  ASA: III  Anesthesia Plan: MAC   Post-op Pain Management:    Induction: Intravenous  Airway Management Planned:   Additional Equipment:   Intra-op Plan:   Post-operative Plan:   Informed Consent: I have reviewed the patients History and Physical, chart, labs and discussed the procedure including the risks, benefits and alternatives for the proposed anesthesia with the patient or authorized representative who has indicated his/her understanding and acceptance.   Dental advisory given  Plan Discussed with: CRNA  Anesthesia Plan Comments:         Anesthesia Quick Evaluation

## 2016-07-10 NOTE — Anesthesia Postprocedure Evaluation (Signed)
Anesthesia Post Note  Patient: Lonnie [name]  Procedure(s) Performed: Procedure(s) (LRB): LEFT BRACHIOCEPHALIC ARTERIOVENOUS (AV) FISTULA CREATION (Left)  Patient location during evaluation: PACU Anesthesia Type: MAC Level of consciousness: awake and alert Pain management: pain level controlled Vital Signs Assessment: post-procedure vital signs reviewed and stable Respiratory status: spontaneous breathing Cardiovascular status: stable Anesthetic complications: no    Last Vitals:  Vitals:   07/10/16 1430 07/10/16 1433  BP:    Pulse: (!) 56 (!) 58  Resp: 15 14  Temp:  36.3 C    Last Pain:  Vitals:   07/10/16 1407  TempSrc:   PainSc: 0-No pain                 Nolon Nations

## 2016-07-10 NOTE — Transfer of Care (Signed)
Immediate Anesthesia Transfer of Care Note  Patient: Lonnie [name]  Procedure(s) Performed: Procedure(s): LEFT BRACHIOCEPHALIC ARTERIOVENOUS (AV) FISTULA CREATION (Left)  Patient Location: PACU  Anesthesia Type:MAC  Level of Consciousness: awake, alert , oriented and sedated  Airway & Oxygen Therapy: Patient Spontanous Breathing and Patient connected to nasal cannula oxygen  Post-op Assessment: Report given to RN, Post -op Vital signs reviewed and stable and Patient moving all extremities  Post vital signs: Reviewed and stable  Last Vitals:  Vitals:   07/10/16 1226  BP: (!) 197/60  Pulse: 65  Resp: 18  Temp: 36.8 C    Last Pain:  Vitals:   07/10/16 1226  TempSrc: Oral      Patients Stated Pain Goal: 2 ([id number])  Complications: No apparent anesthesia complications

## 2016-07-10 NOTE — H&P (View-Only) (Signed)
HISTORY AND PHYSICAL     CC:  In need of permanent HD access Referring Provider:  Edrick Oh MD HPI: This is a 82 y.o. male who presents today for permanent HD access.  He states that he has CKD 4 and his kidneys function at 17%.  He returns to see Dr. Justin Mend in September.    He states he has not had any heart issues or chest pain.  He does have HTN and takes a CCB for this.  He is on synthroid for hypothyroidism.  He denies having stroke.  The only surgery he has had was removal of a carbuncle from his back.  He is not on anticoagulation.  Past Medical History:  Diagnosis Date  . Adenomatous polyps   . Frequent urination at night   . GERD (gastroesophageal reflux disease)   . Hypertension   . Hypothyroidism   . Polyclonal gammopathy determined by serum protein electrophoresis 10/04/2015    Past Surgical History:  Procedure Laterality Date  . RENAL BIOPSY Left 10/06/2015  . SKIN SURGERY     back    No Known Allergies  Current Outpatient Prescriptions  Medication Sig Dispense Refill  . amLODipine (NORVASC) 5 MG tablet Take 1 tablet by mouth at bedtime.     [name] buPROPion (WELLBUTRIN) 75 MG tablet Take 75 mg by mouth daily.    . clotrimazole (LOTRIMIN) 1 % cream Apply 1 application topically 2 (two) times daily. Use of 7-10 days. (Patient taking differently: Apply 1 application topically 2 (two) times daily as needed. Use of 7-10 days.) 30 g 0  . finasteride (PROSCAR) 5 MG tablet Take 5 mg by mouth daily.    [name] levothyroxine (SYNTHROID, LEVOTHROID) 88 MCG tablet Take 88 mcg by mouth at bedtime.    . polyvinyl alcohol (LIQUIFILM TEARS) 1.4 % ophthalmic solution Place 1-2 drops into both eyes as needed for dry eyes.    [name] psyllium (METAMUCIL) 58.6 % powder Take 1 packet by mouth at bedtime. Tablespoon and a half.    . ranitidine (ZANTAC) 150 MG capsule Take 150 mg by mouth 2 (two) times daily.    . tamsulosin (FLOMAX) 0.4 MG CAPS capsule Take 1 capsule by mouth daily.     No current  facility-administered medications for this visit.     Family History  Problem Relation Age of Onset  . Diabetes Father   . Diabetes Brother   . Heart disease Brother   . Colon cancer Neg Hx   . Rectal cancer Neg Hx     Social History   Social History  . Marital status: Widowed    Spouse name: N/A  . Number of children: N/A  . Years of education: N/A   Occupational History  . Not on file.   Social History Main Topics  . Smoking status: Former Smoker    Quit date: 01/25/1980  . Smokeless tobacco: Never Used  . Alcohol use No     Comment: former alcoholic 32 years ago was last drink  . Drug use: No  . Sexual activity: Not on file   Other Topics Concern  . Not on file   Social History Narrative  . No narrative on file     REVIEW OF SYSTEMS:   [X]  denotes positive finding, [ ]  denotes negative finding Cardiac  Comments:  Chest pain or chest pressure:    Shortness of breath upon exertion:    Short of breath when lying flat:    Irregular heart rhythm:  Vascular    Pain in calf, thigh, or hip brought on by ambulation:    Pain in feet at night that wakes you up from your sleep:     Blood clot in your veins:    Leg swelling:         Pulmonary    Oxygen at home:    Productive cough:     Wheezing:         Neurologic    Sudden weakness in arms or legs:     Sudden numbness in arms or legs:     Sudden onset of difficulty speaking or slurred speech:    Temporary loss of vision in one eye:     Problems with dizziness:         Gastrointestinal    Blood in stool:     Vomited blood:         Genitourinary    Burning when urinating:     Blood in urine:        Psychiatric    Major depression:         Hematologic    Bleeding problems:    Problems with blood clotting too easily:        Skin    Rashes or ulcers:        Constitutional    Fever or chills:      PHYSICAL EXAMINATION:  Vitals:   07/05/16 1452  BP: 134/60  Pulse: 61  Resp: 20    Temp: 97.1 F (36.2 C)   Body mass index is 25.24 kg/m.  General:  WDWN in NAD; vital signs documented above Gait: Not observed HENT: WNL, normocephalic Pulmonary: normal non-labored breathing , without Rales, rhonchi,  wheezing Cardiac: regular HR, without  Murmurs, rubs or gallops; without carotid bruits Abdomen: soft, NT, no masses Skin: without rashes Vascular Exam/Pulses:  Right Left  Radial 2+ (normal) 2+ (normal)  Ulnar 1+ (weak) 1+ (weak)  PT 1+ (weak) 1+ (weak)   Extremities: without ischemic changes, without Gangrene , without cellulitis; without open wounds; left brachiocephalic  Musculoskeletal: no muscle wasting or atrophy  Neurologic: A&O X 3;  No focal weakness or paresthesias are detected Psychiatric:  The pt has Normal affect.   Non-Invasive Vascular Imaging:   Upper extremity vein mapping duplex 0000000: Right Cephalic:  [id number] Right Basilic:  NV  Left Cephalic:  [id number] Left Basilic:  [id number]  Upper extremity arterial duplex evaluation 07/05/16: Patent bilateral upper extremity arteries with no evidence of hemodynamically significant stenosis noted.  Waveforms are triphasic. Right  brachial:  0.52 cm (T) Radial:  0.27cm (T) Ulnar:  0.20cm (T)  Left:   Brachial:  0.52cm (T) Radial:  0.28cm (T) Ulnar:  0.18cm (T)   Pt meds includes: Statin:  No. Beta Blocker:  No. Aspirin:  No. ACEI:  No. ARB:  No. Other Antiplatelet/Anticoagulant:  No.    ASSESSMENT/PLAN:: 82 y.o. male with CKD 4 in need of permanent HD access   -pt is right hand dominant and has adequate vein for a left brachiocephalic AVF -Dr. Oneida Alar discussed the risks and benefits of the procedure and the pt wishes to proceed. -will schedule for July 10, 2016   Leontine Locket, [place] [facility] [phone]  Clinic MD:  Pt seen and examined in conjunction with Dr. Oneida Alar  History and exam findings as above patient has adequate vein for a  left brachiocephalic AV fistula. Risks benefits possible complications and procedure details were discussed the patient today including  not limited to bleeding infection ischemic steal non-maturation of fistula he understands and agrees to proceed.  Ruta Hinds, MD [facility] [place] Office: [phone] Pager: [phone]

## 2016-07-11 ENCOUNTER — Telehealth: Payer: Self-pay | Admitting: Vascular Surgery

## 2016-07-11 ENCOUNTER — Encounter (HOSPITAL_COMMUNITY): Payer: Self-pay | Admitting: Vascular Surgery

## 2016-07-11 NOTE — Telephone Encounter (Signed)
Sched appt 9/22 at 8:30. Spoke to pt to inform them of appt.

## 2016-07-11 NOTE — Telephone Encounter (Signed)
-----   Message from Denman Gillian, RN sent at 07/10/2016  4:25 PM EDT ----- Regarding: needs 6 wk. f/u with [facility]   ----- Message ----- From: Conrad Lawton, MD Sent: 07/10/2016   1:50 PM To: [facility] [address]  Lonnie [name] [id number] 1924/12/29  PROCEDURE: left brachiocephalic arteriovenous fistula placement  Asst: RNFA  Follow-up: 6 weeks

## 2016-08-01 DIAGNOSIS — Z6824 Body mass index (BMI) 24.0-24.9, adult: Secondary | ICD-10-CM | POA: Diagnosis not present

## 2016-08-01 DIAGNOSIS — I10 Essential (primary) hypertension: Secondary | ICD-10-CM | POA: Diagnosis not present

## 2016-08-01 DIAGNOSIS — Z23 Encounter for immunization: Secondary | ICD-10-CM | POA: Diagnosis not present

## 2016-08-01 DIAGNOSIS — D472 Monoclonal gammopathy: Secondary | ICD-10-CM | POA: Diagnosis not present

## 2016-08-01 DIAGNOSIS — E038 Other specified hypothyroidism: Secondary | ICD-10-CM | POA: Diagnosis not present

## 2016-08-01 DIAGNOSIS — N184 Chronic kidney disease, stage 4 (severe): Secondary | ICD-10-CM | POA: Diagnosis not present

## 2016-08-01 DIAGNOSIS — M545 Low back pain: Secondary | ICD-10-CM | POA: Diagnosis not present

## 2016-08-01 DIAGNOSIS — D649 Anemia, unspecified: Secondary | ICD-10-CM | POA: Diagnosis not present

## 2016-08-01 DIAGNOSIS — F329 Major depressive disorder, single episode, unspecified: Secondary | ICD-10-CM | POA: Diagnosis not present

## 2016-08-13 IMAGING — US US BIOPSY
1 series · 8 of 8 positions shown · non-contrast
Comparison: none

CLINICAL DATA: 89-year-old with polyclonal gammopathy. Request for
renal biopsy.

[Series 1: us biopsy · 0.23mm/px · 8 acquisitions, 8 frames shown]
[im 1/8]
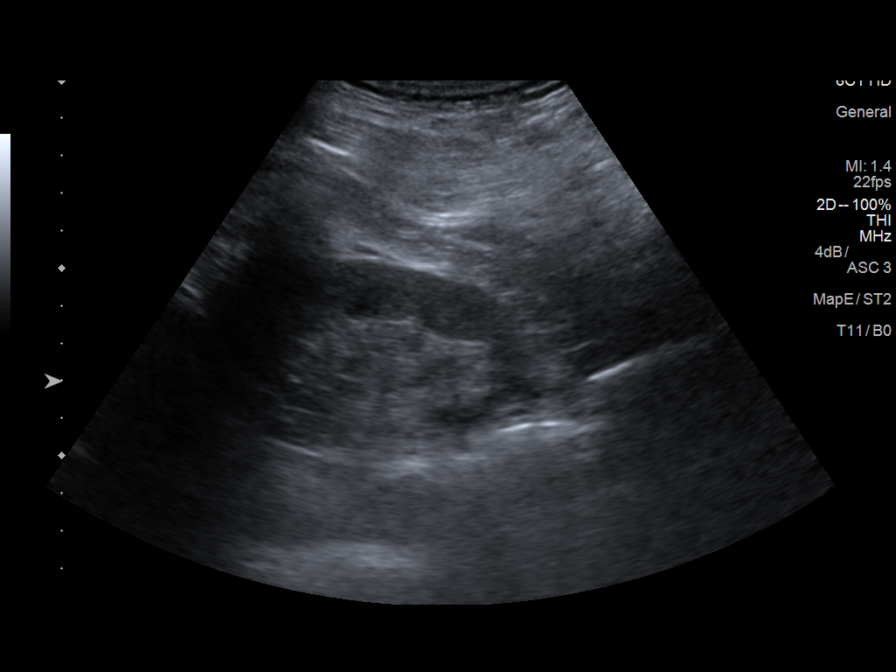
[im 2/8]
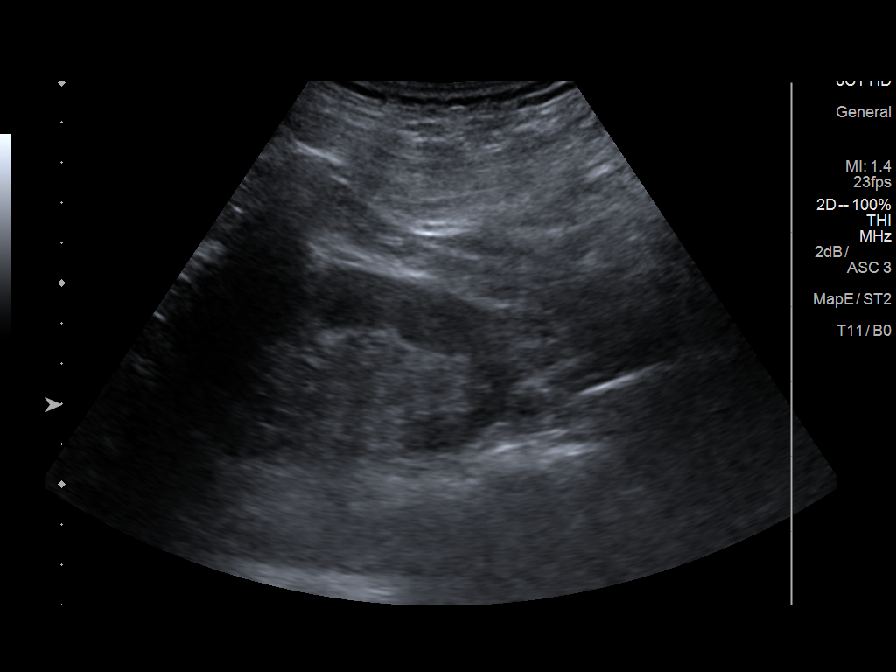
[im 3/8]
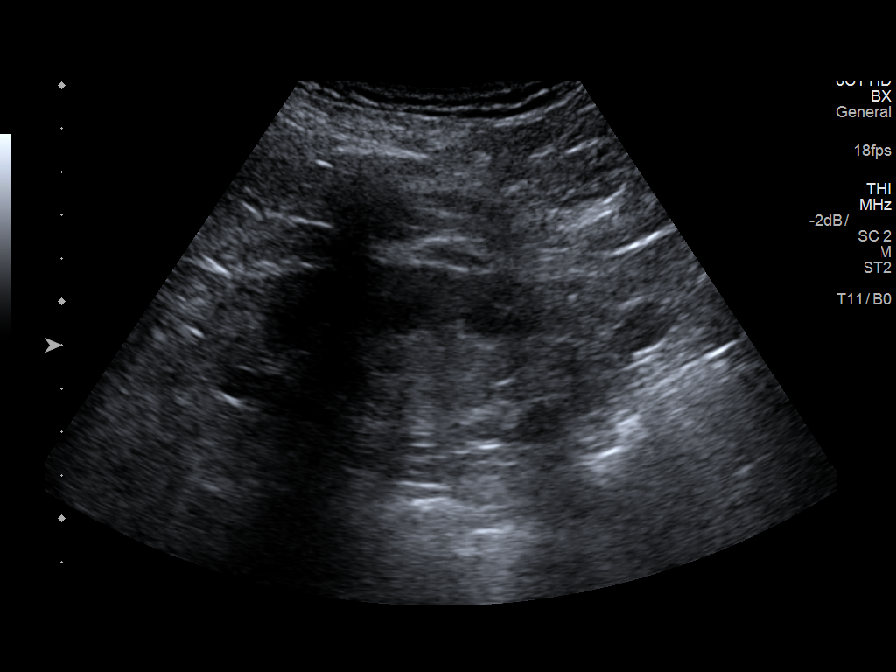
[im 4/8]
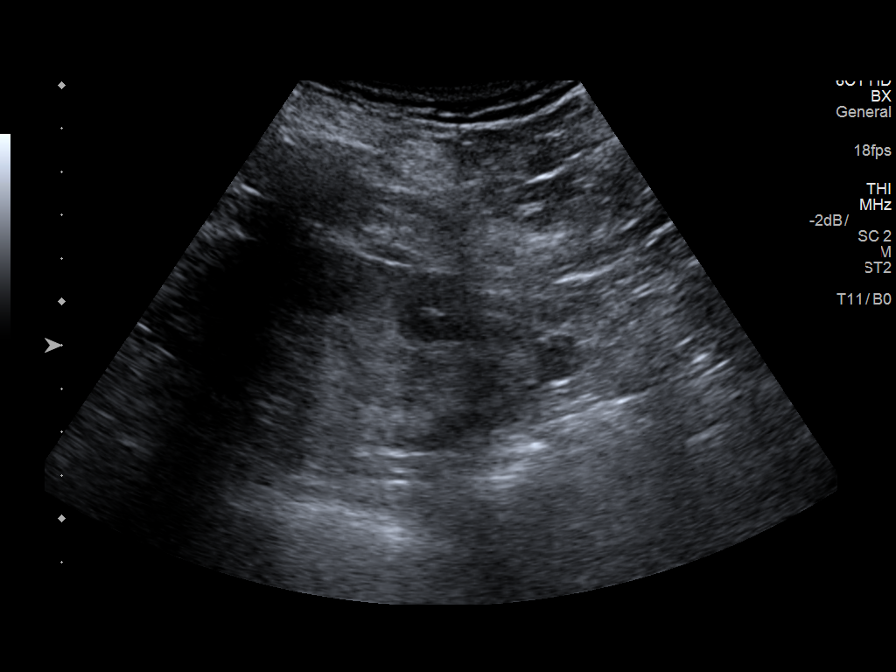
[im 5/8]
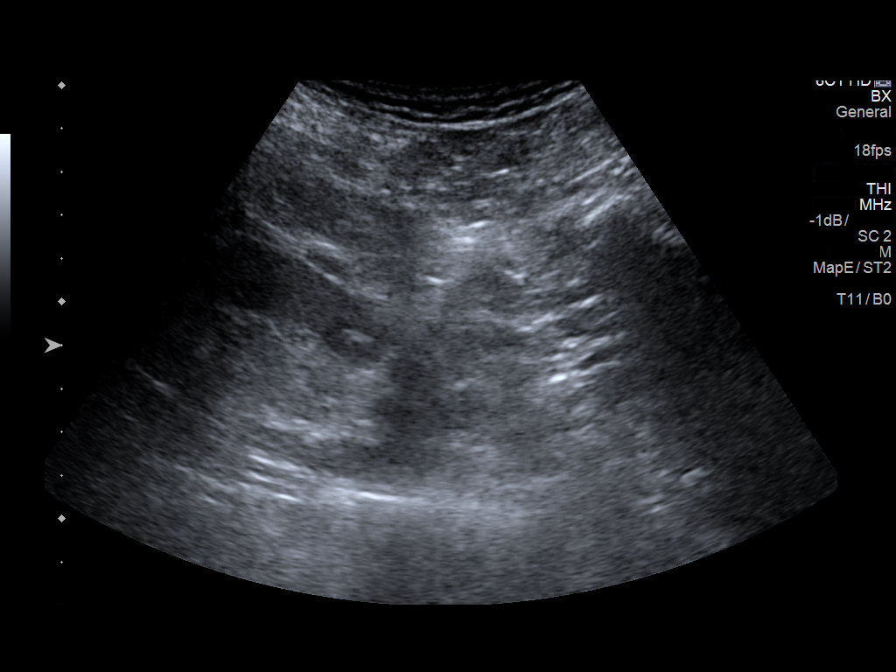
[im 6/8]
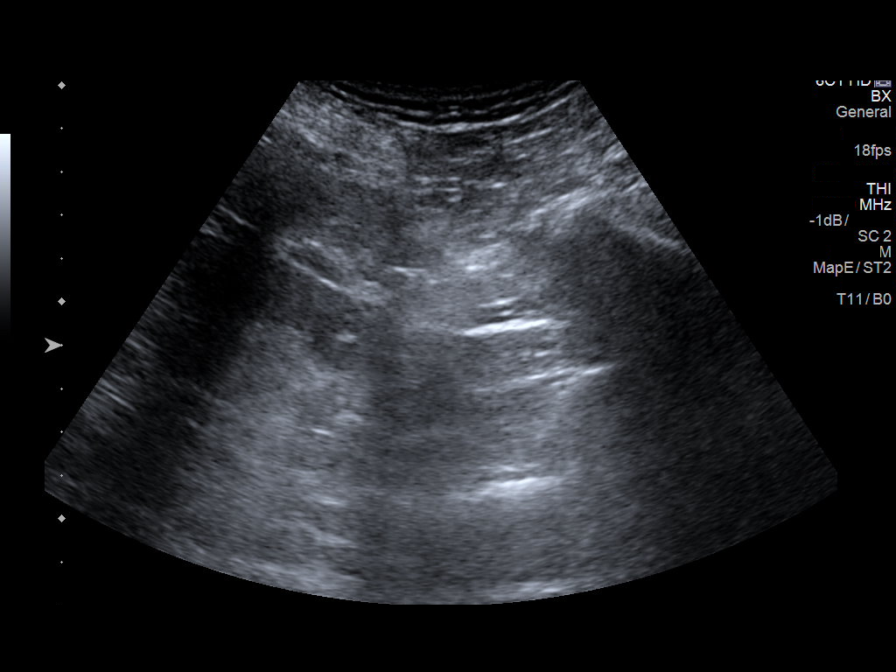
[im 7/8]
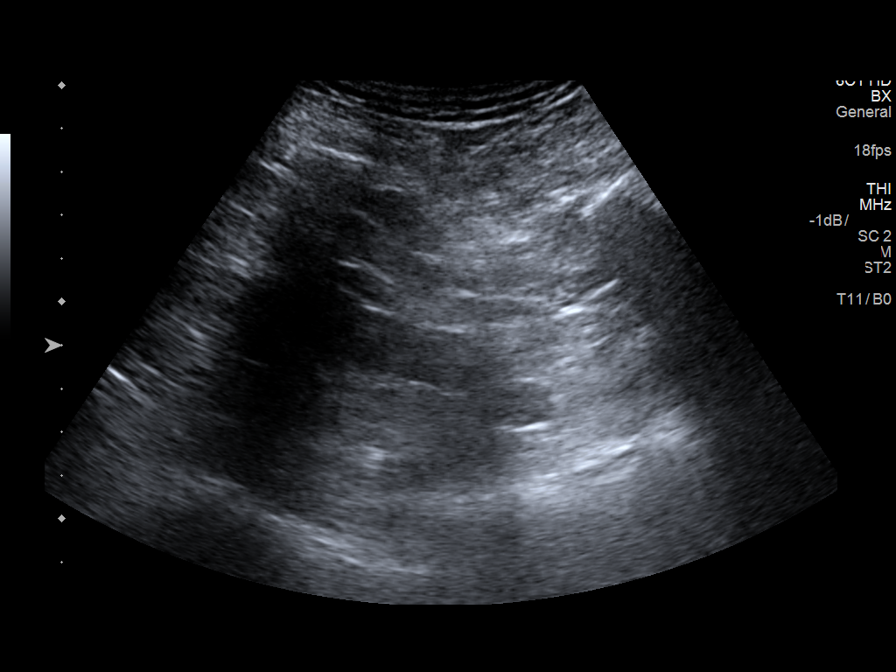
[im 8/8]
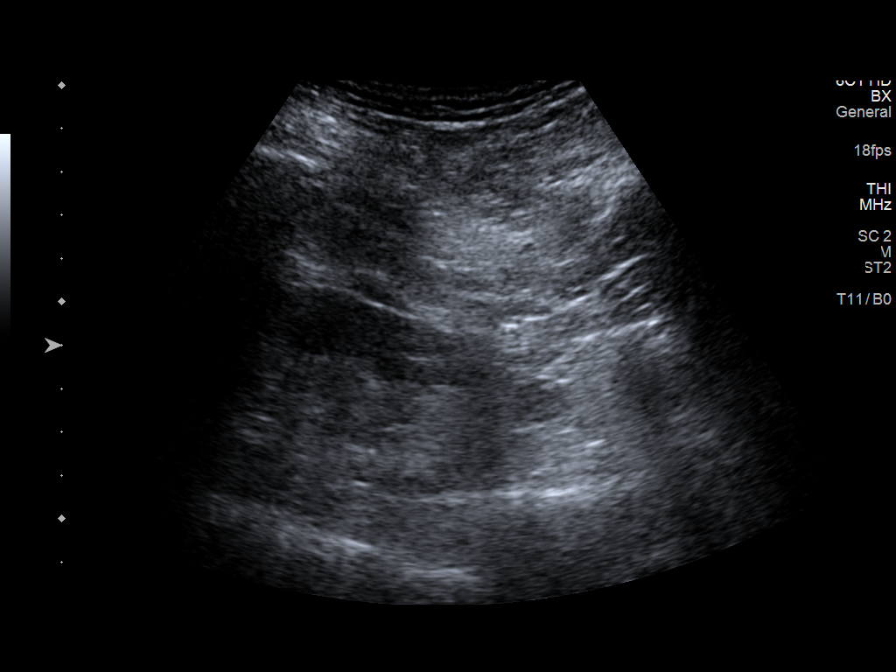

[8 of 8 positions shown; findings below may reference images not displayed]

EXAM:
ULTRASOUND-GUIDED LEFT RENAL BIOPSY

FLUOROSCOPY TIME:  None

MEDICATIONS:
50 mcg fentanyl.

ANESTHESIA/SEDATION:
Patient was monitored by a radiology nurse throughout the procedure.

PROCEDURE:
The procedure was explained to the patient. The risks and benefits
of the procedure were discussed and the patient's questions were
addressed. Informed consent was obtained from the patient.

The patient was placed prone on the ultrasound examination table.
Both kidneys were evaluated with ultrasound. The left kidney lower
pole was selected for biopsy. Left flank was prepped with Betadine.
Sterile field was created. Skin and soft tissues were anesthetized
with 1% lidocaine. Using ultrasound guidance, a 16 gauge Ahav core
needle was directed into the left kidney lower pole. Core biopsy was
obtained and placed in saline. A second ultrasound-guided core
biopsy was obtained from the lower pole cortex. Specimens were
deemed adequate by the pathology PA. Bandage placed over the
puncture site.
FINDINGS: Left kidney lower pole was selected for biopsy. Small exophytic cyst
along the left kidney lower pole. No evidence for bleeding or
hematoma following the core biopsies.

Estimated blood loss: Minimal

COMPLICATIONS:
None
IMPRESSION: Ultrasound-guided core biopsies of the left kidney lower pole.

Plan to keep the patient overnight with bedrest and will follow
CBCs.

## 2016-08-20 ENCOUNTER — Encounter: Payer: Self-pay | Admitting: Vascular Surgery

## 2016-08-23 NOTE — Progress Notes (Signed)
    Postoperative Access Visit   History of Present Illness  Lonnie [name] is a 82 y.o. year old male who presents for postoperative follow-up for: L BC AVF (Date: 07/10/16).  The patient's wounds are healed.  The patient notes no steal symptoms.  The patient is able to complete their activities of daily living.  The patient's current symptoms are: fatigue.  For VQI Use Only  PRE-ADM LIVING: Home  AMB STATUS: Ambulatory  Physical Examination Vitals:   08/24/16 0821 08/24/16 0823  BP: (!) 155/71 (!) 159/73  Pulse: 66   Temp: 97.7 F (36.5 C)     LUE: Incision is healed, skin feels warm, hand grip is 5/5, sensation in digits is intact, palpable thrill, bruit can be auscultated, fistula > 6 mm throughout  Medical Decision Making  Lonnie [name] is a 82 y.o. year old male who presents s/p L BC AVF.   The patient's access is ready for use.  Thank you for allowing [place] to participate in this patient's care.  Adele Barthel, MD, FACS [facility] [place] Office: [phone] Pager: [phone]

## 2016-08-24 ENCOUNTER — Ambulatory Visit (INDEPENDENT_AMBULATORY_CARE_PROVIDER_SITE_OTHER): Payer: Medicare Other | Admitting: Vascular Surgery

## 2016-08-24 ENCOUNTER — Encounter: Payer: Self-pay | Admitting: Vascular Surgery

## 2016-08-24 DIAGNOSIS — N186 End stage renal disease: Secondary | ICD-10-CM | POA: Insufficient documentation

## 2016-08-24 DIAGNOSIS — N184 Chronic kidney disease, stage 4 (severe): Secondary | ICD-10-CM

## 2016-08-24 DIAGNOSIS — Z992 Dependence on renal dialysis: Secondary | ICD-10-CM

## 2016-09-03 DIAGNOSIS — N400 Enlarged prostate without lower urinary tract symptoms: Secondary | ICD-10-CM | POA: Diagnosis not present

## 2016-09-03 DIAGNOSIS — R3915 Urgency of urination: Secondary | ICD-10-CM | POA: Diagnosis not present

## 2016-09-05 DIAGNOSIS — N2581 Secondary hyperparathyroidism of renal origin: Secondary | ICD-10-CM | POA: Diagnosis not present

## 2016-09-05 DIAGNOSIS — N184 Chronic kidney disease, stage 4 (severe): Secondary | ICD-10-CM | POA: Diagnosis not present

## 2016-09-05 DIAGNOSIS — E039 Hypothyroidism, unspecified: Secondary | ICD-10-CM | POA: Diagnosis not present

## 2016-09-05 DIAGNOSIS — D472 Monoclonal gammopathy: Secondary | ICD-10-CM | POA: Diagnosis not present

## 2016-09-05 DIAGNOSIS — N189 Chronic kidney disease, unspecified: Secondary | ICD-10-CM | POA: Diagnosis not present

## 2016-09-05 DIAGNOSIS — D509 Iron deficiency anemia, unspecified: Secondary | ICD-10-CM | POA: Diagnosis not present

## 2016-09-05 DIAGNOSIS — D631 Anemia in chronic kidney disease: Secondary | ICD-10-CM | POA: Diagnosis not present

## 2016-09-05 DIAGNOSIS — R809 Proteinuria, unspecified: Secondary | ICD-10-CM | POA: Diagnosis not present

## 2016-09-12 ENCOUNTER — Other Ambulatory Visit (HOSPITAL_COMMUNITY): Payer: Self-pay | Admitting: *Deleted

## 2016-09-13 ENCOUNTER — Ambulatory Visit (HOSPITAL_COMMUNITY)
Admission: RE | Admit: 2016-09-13 | Discharge: 2016-09-13 | Disposition: A | Discharge status: Home / Self Care | Payer: Medicare Other | Source: Ambulatory Visit | Attending: Nephrology | Admitting: Nephrology

## 2016-09-13 DIAGNOSIS — D638 Anemia in other chronic diseases classified elsewhere: Secondary | ICD-10-CM | POA: Insufficient documentation

## 2016-09-13 DIAGNOSIS — N184 Chronic kidney disease, stage 4 (severe): Secondary | ICD-10-CM | POA: Insufficient documentation

## 2016-09-13 LAB — POCT HEMOGLOBIN-HEMACUE: Hemoglobin: 9 g/dL — ABNORMAL LOW (ref 13.0–17.0)

## 2016-09-13 MED ORDER — CLONIDINE HCL 0.1 MG PO TABS: 0.1000 mg | ORAL_TABLET | Freq: Once | ORAL | Status: DC | PRN

## 2016-09-13 MED ORDER — EPOETIN ALFA 10000 UNIT/ML IJ SOLN
5000.0000 [IU] | INTRAMUSCULAR | Status: DC
  Administered 2016-09-13: 5000 [IU] via SUBCUTANEOUS

## 2016-09-13 MED ORDER — EPOETIN ALFA 10000 UNIT/ML IJ SOLN
INTRAMUSCULAR | Status: AC
  Administered 2016-09-13: 5000 [IU] via SUBCUTANEOUS
  Filled 2016-09-13: qty 1

## 2016-09-13 NOTE — Discharge Instructions (Signed)
Epoetin Alfa injection What is this medicine? EPOETIN ALFA (e POE e tin AL fa) helps your body make more red blood cells. This medicine is used to treat anemia caused by chronic kidney failure, cancer chemotherapy, or HIV-therapy. It may also be used before surgery if you have anemia. This medicine may be used for other purposes; ask your health care provider or pharmacist if you have questions. What should I tell my health care provider before I take this medicine? They need to know if you have any of these conditions: -blood clotting disorders -cancer patient not on chemotherapy -cystic fibrosis -heart disease, such as angina or heart failure -hemoglobin level of 12 g/dL or greater -high blood pressure -low levels of folate, iron, or vitamin B12 -seizures -an unusual or allergic reaction to erythropoietin, albumin, benzyl alcohol, hamster proteins, other medicines, foods, dyes, or preservatives -pregnant or trying to get pregnant -breast-feeding How should I use this medicine? This medicine is for injection into a vein or under the skin. It is usually given by a health care professional in a hospital or clinic setting. If you get this medicine at home, you will be taught how to prepare and give this medicine. Use exactly as directed. Take your medicine at regular intervals. Do not take your medicine more often than directed. It is important that you put your used needles and syringes in a special sharps container. Do not put them in a trash can. If you do not have a sharps container, call your pharmacist or healthcare provider to get one. Talk to your pediatrician regarding the use of this medicine in children. While this drug may be prescribed for selected conditions, precautions do apply. Overdosage: If you think you have taken too much of this medicine contact a poison control center or emergency room at once. NOTE: This medicine is only for you. Do not share this medicine with  others. What if I miss a dose? If you miss a dose, take it as soon as you can. If it is almost time for your next dose, take only that dose. Do not take double or extra doses. What may interact with this medicine? Do not take this medicine with any of the following medications: -darbepoetin alfa This list may not describe all possible interactions. Give your health care provider a list of all the medicines, herbs, non-prescription drugs, or dietary supplements you use. Also tell them if you smoke, drink alcohol, or use illegal drugs. Some items may interact with your medicine. What should I watch for while using this medicine? Visit your prescriber or health care professional for regular checks on your progress and for the needed blood tests and blood pressure measurements. It is especially important for the doctor to make sure your hemoglobin level is in the desired range, to limit the risk of potential side effects and to give you the best benefit. Keep all appointments for any recommended tests. Check your blood pressure as directed. Ask your doctor what your blood pressure should be and when you should contact him or her. As your body makes more red blood cells, you may need to take iron, folic acid, or vitamin B supplements. Ask your doctor or health care provider which products are right for you. If you have kidney disease continue dietary restrictions, even though this medication can make you feel better. Talk with your doctor or health care professional about the foods you eat and the vitamins that you take. What side effects may I notice   from receiving this medicine? Side effects that you should report to your doctor or health care professional as soon as possible: -allergic reactions like skin rash, itching or hives, swelling of the face, lips, or tongue -breathing problems -changes in vision -chest pain -confusion, trouble speaking or understanding -feeling faint or lightheaded,  falls -high blood pressure -muscle aches or pains -pain, swelling, warmth in the leg -rapid weight gain -severe headaches -sudden numbness or weakness of the face, arm or leg -trouble walking, dizziness, loss of balance or coordination -seizures (convulsions) -swelling of the ankles, feet, hands -unusually weak or tired Side effects that usually do not require medical attention (report to your doctor or health care professional if they continue or are bothersome): -diarrhea -fever, chills (flu-like symptoms) -headaches -nausea, vomiting -redness, stinging, or swelling at site where injected This list may not describe all possible side effects. Call your doctor for medical advice about side effects. You may report side effects to FDA at 1-800-FDA-1088. Where should I keep my medicine? Keep out of the reach of children. Store in a refrigerator between 2 and 8 degrees C (36 and 46 degrees F). Do not freeze or shake. Throw away any unused portion if using a single-dose vial. Multi-dose vials can be kept in the refrigerator for up to 21 days after the initial dose. Throw away unused medicine. NOTE: This sheet is a summary. It may not cover all possible information. If you have questions about this medicine, talk to your doctor, pharmacist, or health care provider.    2016, Elsevier/Gold Standard. (2008-11-02 10:25:44)  

## 2016-09-20 ENCOUNTER — Ambulatory Visit (HOSPITAL_COMMUNITY)
Admission: RE | Admit: 2016-09-20 | Discharge: 2016-09-20 | Disposition: A | Discharge status: Home / Self Care | Payer: Medicare Other | Source: Ambulatory Visit | Attending: Nephrology | Admitting: Nephrology

## 2016-09-20 DIAGNOSIS — D631 Anemia in chronic kidney disease: Secondary | ICD-10-CM | POA: Insufficient documentation

## 2016-09-20 DIAGNOSIS — N184 Chronic kidney disease, stage 4 (severe): Secondary | ICD-10-CM | POA: Insufficient documentation

## 2016-09-20 DIAGNOSIS — Z79899 Other long term (current) drug therapy: Secondary | ICD-10-CM | POA: Diagnosis not present

## 2016-09-20 DIAGNOSIS — Z5181 Encounter for therapeutic drug level monitoring: Secondary | ICD-10-CM | POA: Diagnosis not present

## 2016-09-20 LAB — POCT HEMOGLOBIN-HEMACUE: Hemoglobin: 8.9 g/dL — ABNORMAL LOW (ref 13.0–17.0)

## 2016-09-20 MED ORDER — CLONIDINE HCL 0.1 MG PO TABS: 0.1000 mg | ORAL_TABLET | Freq: Once | ORAL | Status: DC | PRN

## 2016-09-20 MED ORDER — EPOETIN ALFA 10000 UNIT/ML IJ SOLN
INTRAMUSCULAR | Status: AC
  Filled 2016-09-20: qty 1

## 2016-09-20 MED ORDER — EPOETIN ALFA 10000 UNIT/ML IJ SOLN
5000.0000 [IU] | INTRAMUSCULAR | Status: DC
  Administered 2016-09-20: 5000 [IU] via SUBCUTANEOUS

## 2016-09-24 ENCOUNTER — Encounter (INDEPENDENT_AMBULATORY_CARE_PROVIDER_SITE_OTHER): Payer: Self-pay | Admitting: Orthopaedic Surgery

## 2016-09-24 ENCOUNTER — Ambulatory Visit (INDEPENDENT_AMBULATORY_CARE_PROVIDER_SITE_OTHER): Payer: Medicare Other | Admitting: Orthopaedic Surgery

## 2016-09-24 ENCOUNTER — Ambulatory Visit (INDEPENDENT_AMBULATORY_CARE_PROVIDER_SITE_OTHER): Payer: Medicare Other

## 2016-09-24 DIAGNOSIS — M79622 Pain in left upper arm: Secondary | ICD-10-CM

## 2016-09-24 DIAGNOSIS — M898X2 Other specified disorders of bone, upper arm: Secondary | ICD-10-CM

## 2016-09-24 NOTE — Progress Notes (Signed)
Office Visit Note   Patient: Lonnie [name]           Date of Birth: February 08, 1925           MRN: [id number] Visit Date: 09/24/2016              Requested by: Prince Solian, MD [address] [place], [place] [address] PCP: Tivis Ringer, MD   Assessment & Plan: Visit Diagnoses:  1. Pain of left humerus     Plan: - muscular strain likely, recommend tylenol - no nsaids due to CKD - f/u prn  Follow-Up Instructions: Return if symptoms worsen or fail to improve.   Orders:  Orders Placed This Encounter  Procedures  . XR Humerus Left   - xrays show no acute abnormalities - osteopenia - no fractures    Procedures: No procedures performed   Clinical Data: No additional findings.   Subjective: Chief Complaint  Patient presents with  . Left Arm - Pain    82 yo male c/o left elbow and arm pain s/p pulling his chair.  Had recent Left AC fossa dialysis fistula placed.  Denies any f/c/n/v.  Pain is worse with exertion.  Has not taken any meds.  Pain does not radiate.    Review of Systems  Constitutional: Negative.   Musculoskeletal: Negative for arthralgias and back pain.  Skin: Negative.      Objective: Vital Signs: There were no vitals taken for this visit.  Physical Exam  Left Elbow Exam   Range of Motion  The patient has normal left elbow ROM.  Muscle Strength  Pronation:  5/5  Supination:  5/5   Other  Erythema: absent Scars: present Sensation: normal Pulse: present  Comments:  Dialysis fistula ttp with palpable thrill.  No signs of infection.  No swelling.  Normal strength      Specialty Comments:  No specialty comments available.  Imaging: No results found.   PMFS History: Patient Active Problem List   Diagnosis Date Noted  . CKD (chronic kidney disease) stage 4, GFR 15-29 ml/min (HCC) 08/24/2016  . Monoclonal gammopathy 10/06/2015  . Gammopathy, monoclonal   . Polyclonal gammopathy determined by serum protein  electrophoresis 10/04/2015  . Depression 03/29/2015  . Intertrigo 05/30/2014  . Proteinuria 05/05/2014   Past Medical History:  Diagnosis Date  . Adenomatous polyps   . Bursitis    left hip  . Chronic kidney disease    Stage 4  . Frequent urination at night   . GERD (gastroesophageal reflux disease)   . Hypertension   . Hypothyroidism   . Pneumonia   . Polyclonal gammopathy determined by serum protein electrophoresis 10/04/2015    Family History  Problem Relation Age of Onset  . Diabetes Father   . Diabetes Brother   . Heart disease Brother   . Colon cancer Neg Hx   . Rectal cancer Neg Hx     Past Surgical History:  Procedure Laterality Date  . AV FISTULA PLACEMENT Left 07/10/2016   Procedure: LEFT BRACHIOCEPHALIC ARTERIOVENOUS (AV) FISTULA CREATION;  Surgeon: Conrad [place], MD;  Location: [facility];  Service: Vascular;  Laterality: Left;  . COLONOSCOPY    . RENAL BIOPSY Left 10/06/2015  . SKIN SURGERY     back   Social History   Occupational History  . Not on file.   Social History Main Topics  . Smoking status: Former Smoker    Quit date: 01/25/1980  . Smokeless tobacco: Never Used  . Alcohol  use No     Comment: former alcoholic 32 years ago was last drink  . Drug use: No  . Sexual activity: Not on file

## 2016-09-26 DIAGNOSIS — Z6825 Body mass index (BMI) 25.0-25.9, adult: Secondary | ICD-10-CM | POA: Diagnosis not present

## 2016-09-26 DIAGNOSIS — H608X9 Other otitis externa, unspecified ear: Secondary | ICD-10-CM | POA: Diagnosis not present

## 2016-09-27 ENCOUNTER — Encounter (HOSPITAL_COMMUNITY)
Admission: RE | Admit: 2016-09-27 | Discharge: 2016-09-27 | Disposition: A | Discharge status: Home / Self Care | Payer: Medicare Other | Source: Ambulatory Visit | Attending: Nephrology | Admitting: Nephrology

## 2016-09-27 DIAGNOSIS — N184 Chronic kidney disease, stage 4 (severe): Secondary | ICD-10-CM | POA: Diagnosis not present

## 2016-09-27 DIAGNOSIS — Z5181 Encounter for therapeutic drug level monitoring: Secondary | ICD-10-CM | POA: Insufficient documentation

## 2016-09-27 DIAGNOSIS — Z79899 Other long term (current) drug therapy: Secondary | ICD-10-CM | POA: Diagnosis not present

## 2016-09-27 DIAGNOSIS — D631 Anemia in chronic kidney disease: Secondary | ICD-10-CM | POA: Diagnosis not present

## 2016-09-27 LAB — IRON AND TIBC
IRON: 35 ug/dL — AB (ref 45–182)
SATURATION RATIOS: 13 % — AB (ref 17.9–39.5)
TIBC: 265 ug/dL (ref 250–450)
UIBC: 230 ug/dL

## 2016-09-27 LAB — POCT HEMOGLOBIN-HEMACUE: Hemoglobin: 9.5 g/dL — ABNORMAL LOW (ref 13.0–17.0)

## 2016-09-27 LAB — FERRITIN: FERRITIN: 88 ng/mL (ref 24–336)

## 2016-09-27 MED ORDER — EPOETIN ALFA 10000 UNIT/ML IJ SOLN
5000.0000 [IU] | INTRAMUSCULAR | Status: DC
  Administered 2016-09-27: 5000 [IU] via SUBCUTANEOUS

## 2016-09-27 MED ORDER — EPOETIN ALFA 10000 UNIT/ML IJ SOLN
INTRAMUSCULAR | Status: AC
  Filled 2016-09-27: qty 1

## 2016-10-04 ENCOUNTER — Ambulatory Visit (HOSPITAL_COMMUNITY)
Admission: RE | Admit: 2016-10-04 | Discharge: 2016-10-04 | Disposition: A | Discharge status: Home / Self Care | Payer: Medicare Other | Source: Ambulatory Visit | Attending: Nephrology | Admitting: Nephrology

## 2016-10-04 DIAGNOSIS — Z79899 Other long term (current) drug therapy: Secondary | ICD-10-CM | POA: Insufficient documentation

## 2016-10-04 DIAGNOSIS — D631 Anemia in chronic kidney disease: Secondary | ICD-10-CM | POA: Diagnosis not present

## 2016-10-04 DIAGNOSIS — Z5181 Encounter for therapeutic drug level monitoring: Secondary | ICD-10-CM | POA: Diagnosis not present

## 2016-10-04 DIAGNOSIS — N184 Chronic kidney disease, stage 4 (severe): Secondary | ICD-10-CM | POA: Diagnosis not present

## 2016-10-04 MED ORDER — CLONIDINE HCL 0.1 MG PO TABS: 0.1000 mg | ORAL_TABLET | Freq: Once | ORAL | Status: DC | PRN

## 2016-10-04 MED ORDER — EPOETIN ALFA 10000 UNIT/ML IJ SOLN
5000.0000 [IU] | INTRAMUSCULAR | Status: DC
  Administered 2016-10-04: 5000 [IU] via SUBCUTANEOUS

## 2016-10-04 MED ORDER — EPOETIN ALFA 10000 UNIT/ML IJ SOLN
INTRAMUSCULAR | Status: AC
  Filled 2016-10-04: qty 1

## 2016-10-05 LAB — POCT HEMOGLOBIN-HEMACUE: HEMOGLOBIN: 8.9 g/dL — AB (ref 13.0–17.0)

## 2016-10-10 IMAGING — CT CT BIOPSY
4 series · 14 of 22 positions shown, 19 images · non-contrast
Comparison: none

CLINICAL DATA: 89-year-old with the monoclonal gammopathy.

[Series 4: add scan 5.0 b70f · axial · 0.41mm/px · z∈[-52,-46]mm · 2 of 4 slices shown (1 of 4)]
[im 2/4  soft-tissue]
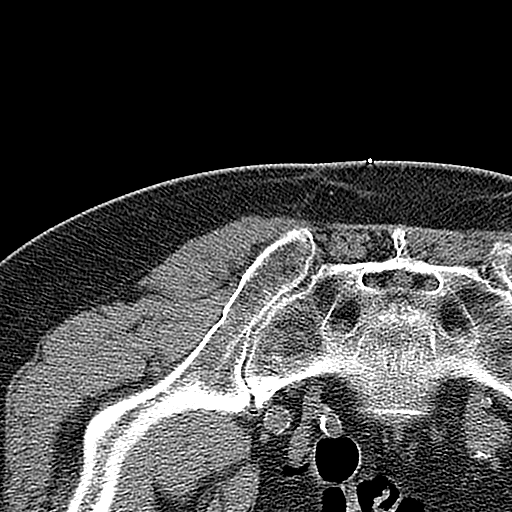
[im 3/4  soft-tissue]
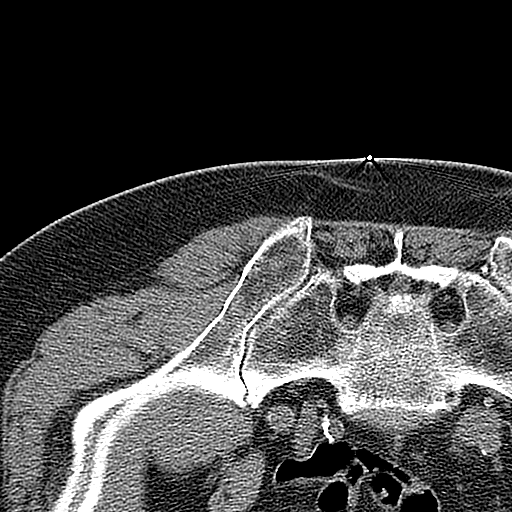

[Series 5: add scan 5.0 b70f · axial · 0.41mm/px · z∈[-52,-46]mm · 2 of 4 slices shown (2 of 4)]
[im 2/4  soft-tissue]
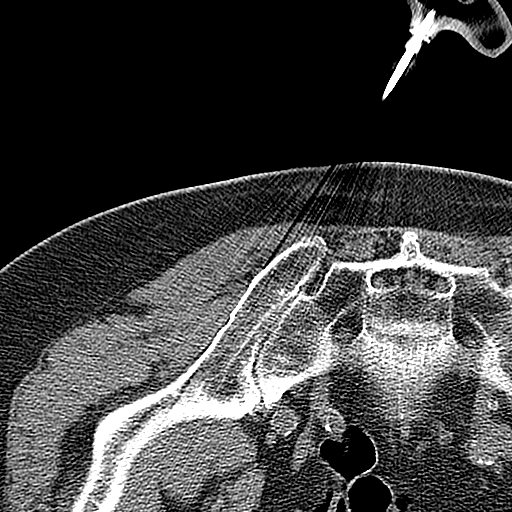
[im 3/4  soft-tissue]
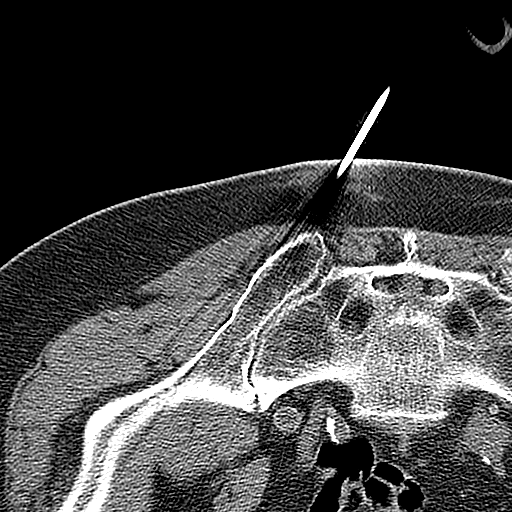

[Series 6: add scan 5.0 b70f · axial · 0.41mm/px · z∈[-52,-37]mm · 4 of 6 slices shown (3 of 4)]
[im 2/6  soft-tissue]
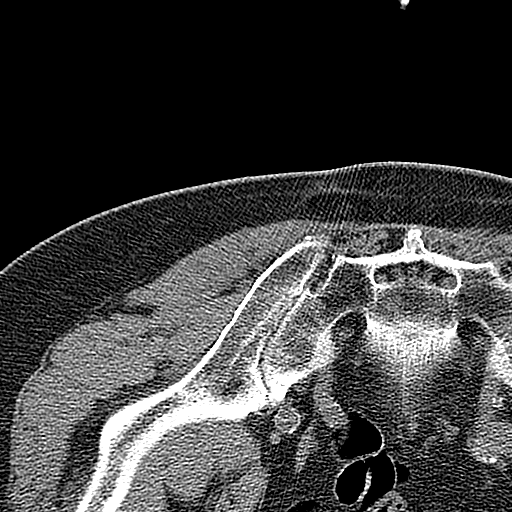
[im 3/6  soft-tissue]
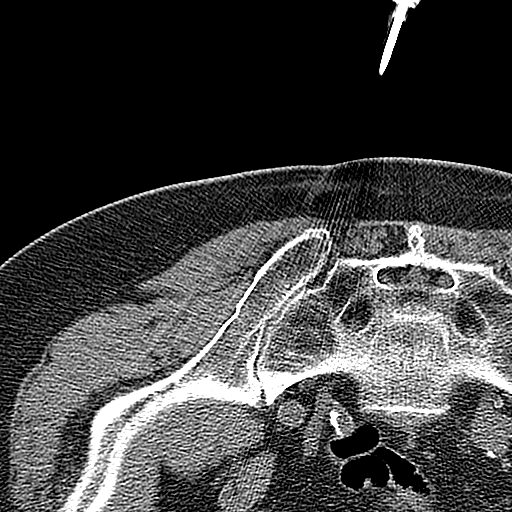
[im 4/6  soft-tissue]
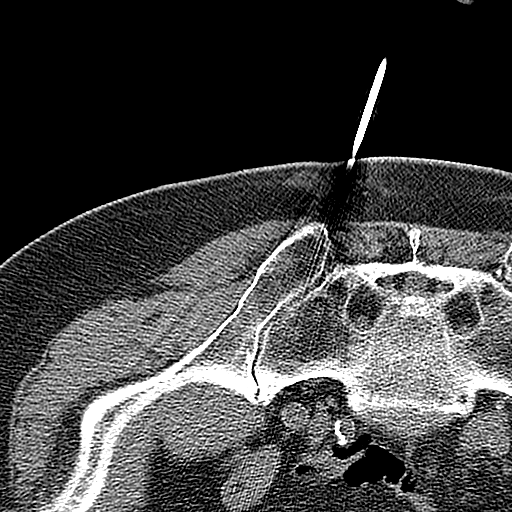
[im 5/6  soft-tissue]
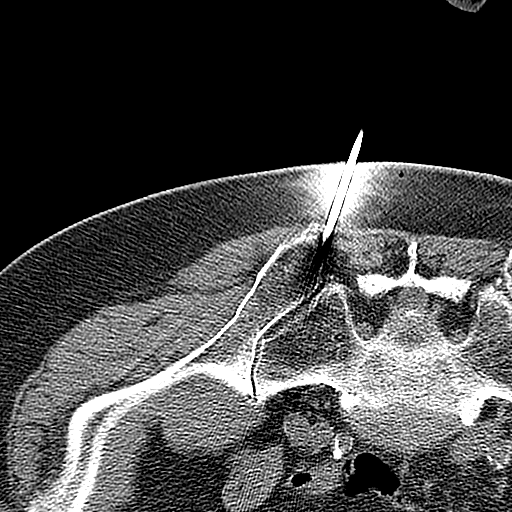

[Series 7: add scan 5.0 b70f · axial · 0.41mm/px · z∈[-56,-30]mm · 6 of 8 slices shown, 11 images (4 of 4)]
[im 2/8  soft-tissue]
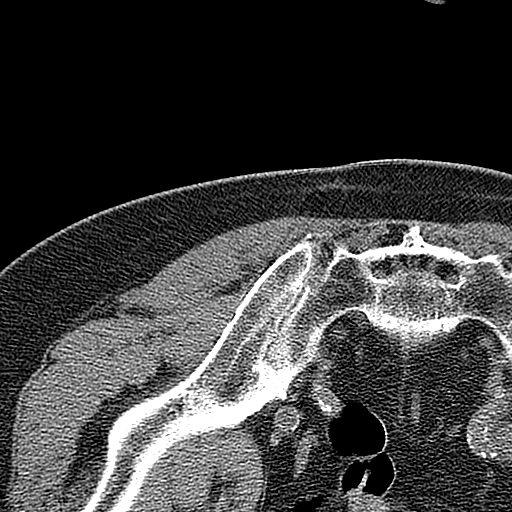
[im 2/8  bone]
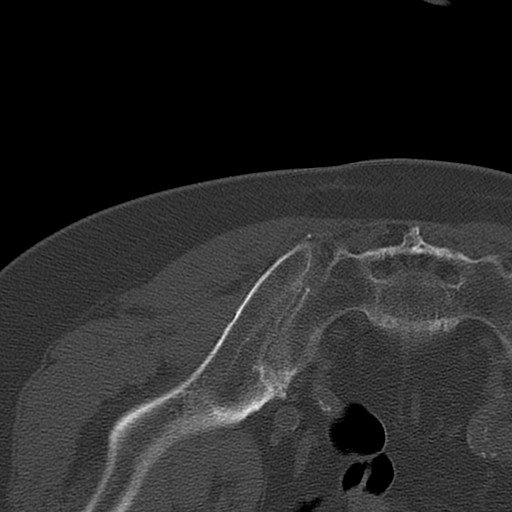
[im 3/8  soft-tissue]
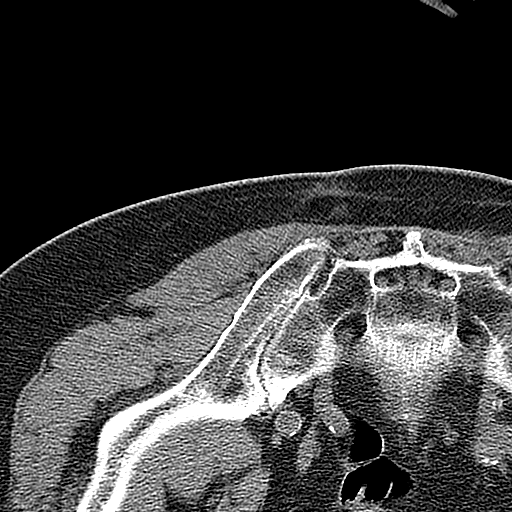
[im 4/8  soft-tissue]
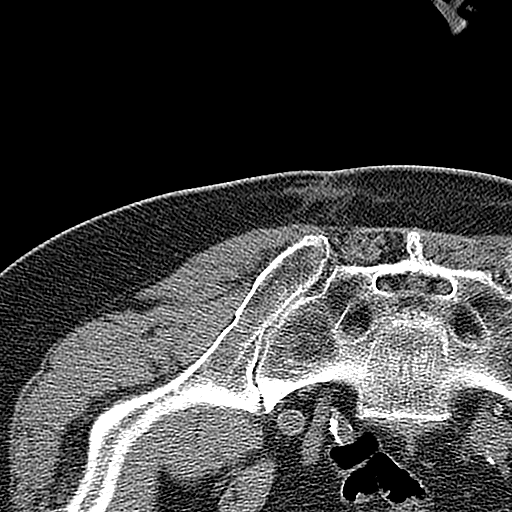
[im 4/8  lung]
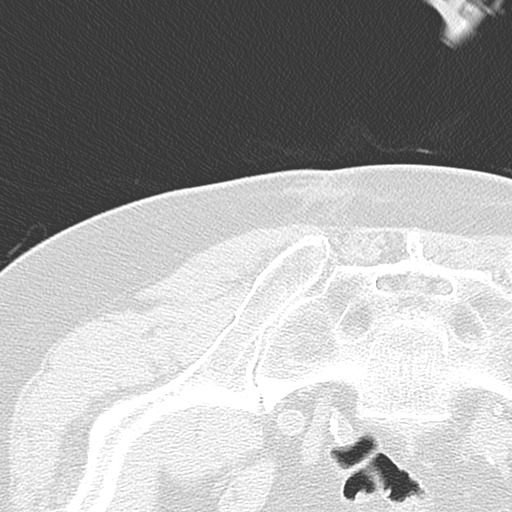
[im 5/8  soft-tissue]
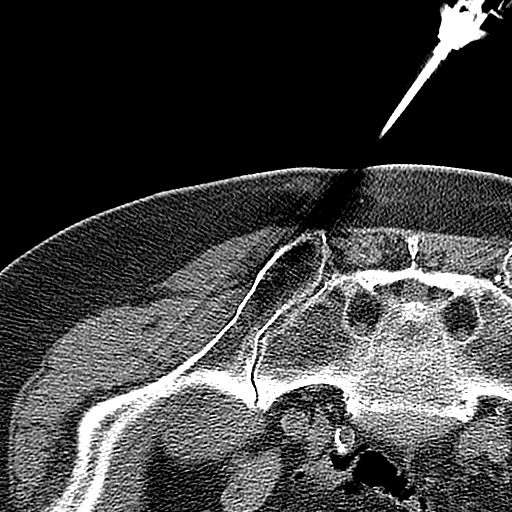
[im 5/8  lung]
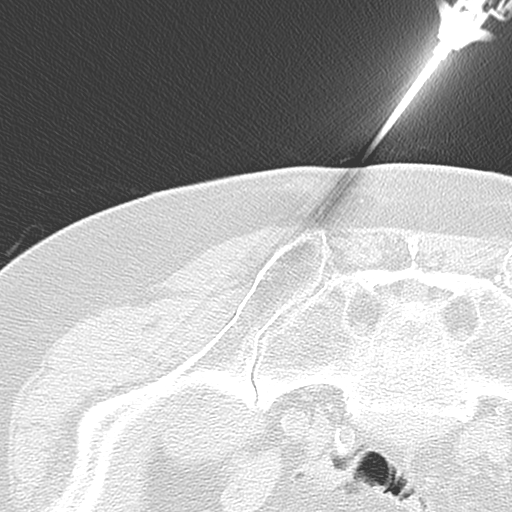
[im 6/8  soft-tissue]
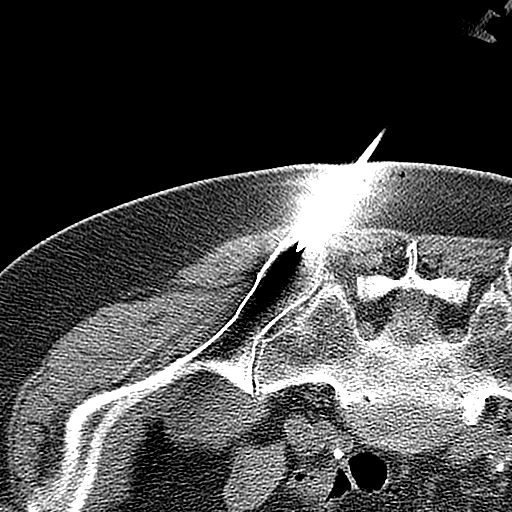
[im 6/8  lung]
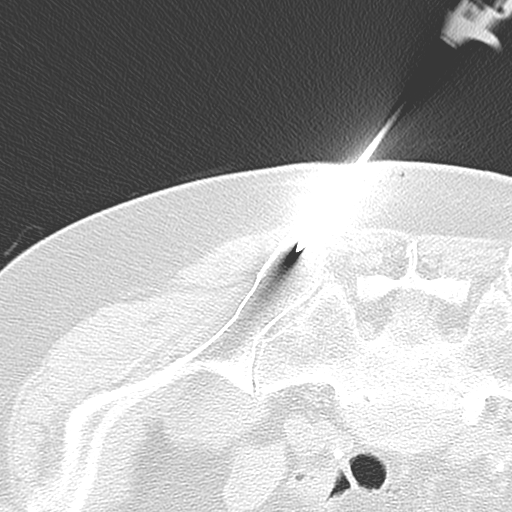
[im 7/8  soft-tissue]
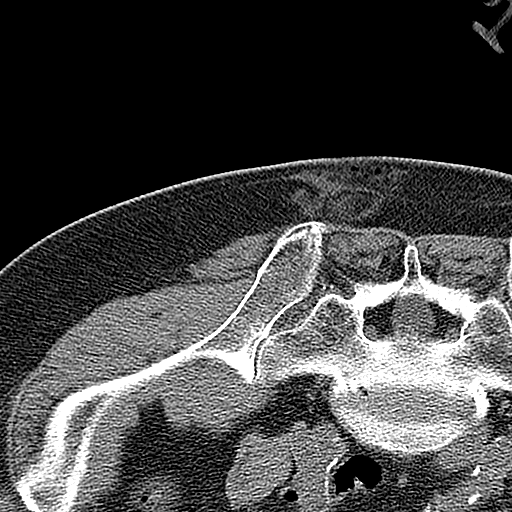
[im 7/8  lung]
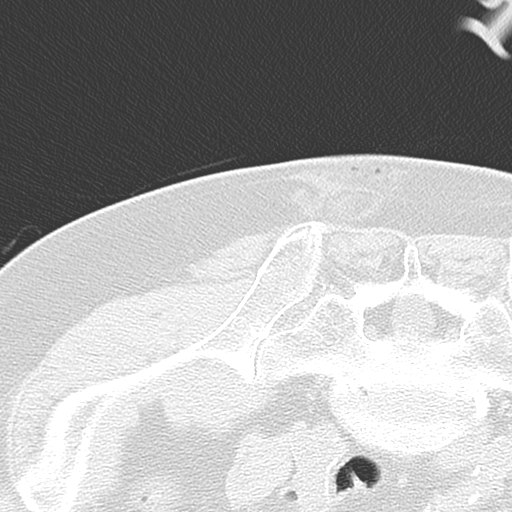

[14 of 22 positions shown; findings below may reference images not displayed]

EXAM:
CT GUIDED BONE MARROW ASPIRATES AND BIOPSY

MEDICATIONS:
2 mg Versed, 150 mcg fentanyl. A radiology nurse monitored the
patient for moderate sedation.

ANESTHESIA/SEDATION:
Sedation time: 14 minutes

PROCEDURE:
The procedure was explained to the patient. The risks and benefits
of the procedure were discussed and the patient's questions were
addressed. Informed consent was obtained from the patient. The
patient was placed prone on CT scan. Images of the pelvis were
obtained. The right side of back was prepped and draped in sterile
fashion. The skin and right posterior iliac bone were anesthetized
with 1% lidocaine. 11 gauge bone needle was directed into the right
iliac bone with CT guidance. Two aspirates and two core biopsies
obtained. Bandage placed over the puncture site.
FINDINGS: Bone needle directed into the posterior right ilium.

Estimated blood loss: Minimal

COMPLICATIONS:
None
IMPRESSION: CT guided bone marrow aspirates and core biopsy.

## 2016-10-11 ENCOUNTER — Encounter (HOSPITAL_COMMUNITY)
Admission: RE | Admit: 2016-10-11 | Discharge: 2016-10-11 | Disposition: A | Discharge status: Home / Self Care | Payer: Medicare Other | Source: Ambulatory Visit | Attending: Nephrology | Admitting: Nephrology

## 2016-10-11 DIAGNOSIS — D631 Anemia in chronic kidney disease: Secondary | ICD-10-CM | POA: Insufficient documentation

## 2016-10-11 DIAGNOSIS — Z5181 Encounter for therapeutic drug level monitoring: Secondary | ICD-10-CM | POA: Insufficient documentation

## 2016-10-11 DIAGNOSIS — N184 Chronic kidney disease, stage 4 (severe): Secondary | ICD-10-CM | POA: Diagnosis not present

## 2016-10-11 DIAGNOSIS — Z79899 Other long term (current) drug therapy: Secondary | ICD-10-CM | POA: Diagnosis not present

## 2016-10-11 LAB — POCT HEMOGLOBIN-HEMACUE: Hemoglobin: 9.9 g/dL — ABNORMAL LOW (ref 13.0–17.0)

## 2016-10-11 MED ORDER — EPOETIN ALFA 10000 UNIT/ML IJ SOLN
INTRAMUSCULAR | Status: AC
  Filled 2016-10-11: qty 1

## 2016-10-11 MED ORDER — EPOETIN ALFA 10000 UNIT/ML IJ SOLN
5000.0000 [IU] | INTRAMUSCULAR | Status: DC
  Administered 2016-10-11: 5000 [IU] via SUBCUTANEOUS

## 2016-10-12 ENCOUNTER — Telehealth: Payer: Self-pay

## 2016-10-12 NOTE — Telephone Encounter (Signed)
Pt. Called to report intermittent discoloration of left index finger; described as "bluish". Stated when the finger becomes discolored, it "feels funny."  Denied pain, numbness of left hand/fingers.  Denied any problem with gripping with left hand.  Stated the hand gets cold at times.  Unsure of how long the symptom has been going on.  Advised will sched. an appt. for eval., and he will be contacted by [employer].  Verb. Understanding.

## 2016-10-15 NOTE — Telephone Encounter (Signed)
Sched appt 11/02/16  4:00 PA to see. Lm on hm# to inform pt of appt.

## 2016-10-18 ENCOUNTER — Inpatient Hospital Stay (HOSPITAL_COMMUNITY): Admission: RE | Admit: 2016-10-18 | Payer: Medicare Other | Source: Ambulatory Visit

## 2016-10-19 ENCOUNTER — Encounter (HOSPITAL_COMMUNITY)
Admission: RE | Admit: 2016-10-19 | Discharge: 2016-10-19 | Disposition: A | Discharge status: Home / Self Care | Payer: Medicare Other | Source: Ambulatory Visit | Attending: Nephrology | Admitting: Nephrology

## 2016-10-19 DIAGNOSIS — N184 Chronic kidney disease, stage 4 (severe): Secondary | ICD-10-CM

## 2016-10-19 DIAGNOSIS — Z79899 Other long term (current) drug therapy: Secondary | ICD-10-CM | POA: Diagnosis not present

## 2016-10-19 DIAGNOSIS — D631 Anemia in chronic kidney disease: Secondary | ICD-10-CM | POA: Diagnosis not present

## 2016-10-19 DIAGNOSIS — Z5181 Encounter for therapeutic drug level monitoring: Secondary | ICD-10-CM | POA: Diagnosis not present

## 2016-10-19 LAB — POCT HEMOGLOBIN-HEMACUE: HEMOGLOBIN: 10.1 g/dL — AB (ref 13.0–17.0)

## 2016-10-19 MED ORDER — EPOETIN ALFA 10000 UNIT/ML IJ SOLN
5000.0000 [IU] | INTRAMUSCULAR | Status: DC
  Administered 2016-10-19: 5000 [IU] via SUBCUTANEOUS

## 2016-10-19 MED ORDER — EPOETIN ALFA 10000 UNIT/ML IJ SOLN
INTRAMUSCULAR | Status: AC
  Filled 2016-10-19: qty 1

## 2016-10-26 ENCOUNTER — Encounter (HOSPITAL_COMMUNITY)
Admission: RE | Admit: 2016-10-26 | Discharge: 2016-10-26 | Disposition: A | Discharge status: Home / Self Care | Payer: Medicare Other | Source: Ambulatory Visit | Attending: Nephrology | Admitting: Nephrology

## 2016-10-26 DIAGNOSIS — Z5181 Encounter for therapeutic drug level monitoring: Secondary | ICD-10-CM | POA: Diagnosis not present

## 2016-10-26 DIAGNOSIS — Z79899 Other long term (current) drug therapy: Secondary | ICD-10-CM | POA: Diagnosis not present

## 2016-10-26 DIAGNOSIS — N184 Chronic kidney disease, stage 4 (severe): Secondary | ICD-10-CM

## 2016-10-26 DIAGNOSIS — D631 Anemia in chronic kidney disease: Secondary | ICD-10-CM | POA: Diagnosis not present

## 2016-10-26 LAB — IRON AND TIBC
IRON: 24 ug/dL — AB (ref 45–182)
Saturation Ratios: 9 % — ABNORMAL LOW (ref 17.9–39.5)
TIBC: 269 ug/dL (ref 250–450)
UIBC: 245 ug/dL

## 2016-10-26 LAB — POCT HEMOGLOBIN-HEMACUE: Hemoglobin: 10.5 g/dL — ABNORMAL LOW (ref 13.0–17.0)

## 2016-10-26 LAB — FERRITIN: Ferritin: 64 ng/mL (ref 24–336)

## 2016-10-26 MED ORDER — EPOETIN ALFA 10000 UNIT/ML IJ SOLN
INTRAMUSCULAR | Status: AC
  Administered 2016-10-26: 5000 [IU] via SUBCUTANEOUS
  Filled 2016-10-26: qty 1

## 2016-10-26 MED ORDER — EPOETIN ALFA 10000 UNIT/ML IJ SOLN
5000.0000 [IU] | INTRAMUSCULAR | Status: DC
  Administered 2016-10-26: 5000 [IU] via SUBCUTANEOUS

## 2016-10-31 ENCOUNTER — Encounter: Payer: Self-pay | Admitting: Vascular Surgery

## 2016-11-01 ENCOUNTER — Encounter (HOSPITAL_COMMUNITY)
Admission: RE | Admit: 2016-11-01 | Discharge: 2016-11-01 | Disposition: A | Discharge status: Home / Self Care | Payer: Medicare Other | Source: Ambulatory Visit | Attending: Nephrology | Admitting: Nephrology

## 2016-11-01 DIAGNOSIS — N184 Chronic kidney disease, stage 4 (severe): Secondary | ICD-10-CM | POA: Diagnosis not present

## 2016-11-01 DIAGNOSIS — D631 Anemia in chronic kidney disease: Secondary | ICD-10-CM | POA: Diagnosis not present

## 2016-11-01 DIAGNOSIS — Z5181 Encounter for therapeutic drug level monitoring: Secondary | ICD-10-CM | POA: Diagnosis not present

## 2016-11-01 DIAGNOSIS — Z79899 Other long term (current) drug therapy: Secondary | ICD-10-CM | POA: Diagnosis not present

## 2016-11-01 LAB — POCT HEMOGLOBIN-HEMACUE: Hemoglobin: 10.6 g/dL — ABNORMAL LOW (ref 13.0–17.0)

## 2016-11-01 MED ORDER — EPOETIN ALFA 10000 UNIT/ML IJ SOLN: 5000.0000 [IU] | INTRAMUSCULAR | Status: DC

## 2016-11-01 MED ORDER — EPOETIN ALFA 10000 UNIT/ML IJ SOLN
INTRAMUSCULAR | Status: AC
  Administered 2016-11-01: 10000 [IU] via SUBCUTANEOUS
  Filled 2016-11-01: qty 1

## 2016-11-01 NOTE — Progress Notes (Signed)
Patient presented today for his injection appointment, complaining of intermittent cramping, coldness and discoloration of left hand. Left hand and nailbeds pale  blue, hand and fingers cold. 2+ radial pulse palpated, fistula +bruit and thrill. Radial pulse present with doppler. Patient states that he called Dr. Lianne Moris office 3 weeks ago and has an appointment for tomorrow. Requested that we call and ask if someone can see him today instead. Dorothea Glassman, RN called Dr. Bridgett Larsson. Dr. Bridgett Larsson in [place] currently. Gerri Lins, PA in to see patient.

## 2016-11-01 NOTE — Consult Note (Signed)
[facility] NOTE   MRN : [id number]  Reason for Consult: left finger tips with blue discoloration  Referring Physician: Nephrologist   History of Present Illness: This is a 82 y.o. male who presents today for permanent HD access.  He states that he has CKD 4 and his kidneys function at 17%.  He had a left brachiocephalic arteriovenous fistula placement by Dr. Bridgett Larsson.   He states about 3 weeks ago he noticed his finger tips turning blue and occasional tingling.  He states he has not had any heart issues or chest pain.  He does have HTN and takes a CCB for this.  He is on synthroid for hypothyroidism.  He denies having stroke.  The only surgery he has had was removal of a carbuncle from his back.  He is not on anticoagulation.       Current Outpatient Prescriptions  Medication Sig Dispense Refill  . amLODipine (NORVASC) 5 MG tablet Take 1 tablet by mouth at bedtime.     [name] buPROPion (WELLBUTRIN) 75 MG tablet Take 75 mg by mouth daily.    . clotrimazole (LOTRIMIN) 1 % cream Apply 1 application topically 2 (two) times daily. Use of 7-10 days. (Patient taking differently: Apply 1 application topically 2 (two) times daily as needed. Use of 7-10 days.) 30 g 0  . escitalopram (LEXAPRO) 10 MG tablet Take 1 tablet by mouth daily.    . finasteride (PROSCAR) 5 MG tablet Take 5 mg by mouth daily.    [name] levothyroxine (SYNTHROID, LEVOTHROID) 88 MCG tablet Take 88 mcg by mouth at bedtime.    [name] oxyCODONE-acetaminophen (ROXICET) 5-325 MG tablet Take 1 tablet by mouth every 6 (six) hours as needed for moderate pain. 10 tablet 0  . polyvinyl alcohol (LIQUIFILM TEARS) 1.4 % ophthalmic solution Place 1-2 drops into both eyes as needed for dry eyes.    [name] psyllium (METAMUCIL) 58.6 % powder Take 1 packet by mouth at bedtime. Tablespoon and a half.    . ranitidine (ZANTAC) 150 MG capsule Take 150 mg by mouth 2 (two) times daily.    . tamsulosin (FLOMAX) 0.4 MG CAPS capsule Take 1  capsule by mouth daily.     Current Facility-Administered Medications  Medication Dose Route Frequency Provider Last Rate Last Dose  . epoetin alfa (EPOGEN,PROCRIT) injection 5,000 Units  5,000 Units Subcutaneous Weekly Edrick Oh, MD         Past Medical History:  Diagnosis Date  . Adenomatous polyps   . Bursitis    left hip  . Chronic kidney disease    Stage 4  . Frequent urination at night   . GERD (gastroesophageal reflux disease)   . Hypertension   . Hypothyroidism   . Pneumonia   . Polyclonal gammopathy determined by serum protein electrophoresis 10/04/2015    Past Surgical History:  Procedure Laterality Date  . AV FISTULA PLACEMENT Left 07/10/2016   Procedure: LEFT BRACHIOCEPHALIC ARTERIOVENOUS (AV) FISTULA CREATION;  Surgeon: Conrad Hytop, MD;  Location: [facility];  Service: Vascular;  Laterality: Left;  . COLONOSCOPY    . RENAL BIOPSY Left 10/06/2015  . SKIN SURGERY     back    Social History Social History  Substance Use Topics  . Smoking status: Former Smoker    Quit date: 01/25/1980  . Smokeless tobacco: Never Used  . Alcohol use No     Comment: former alcoholic 32 years ago was last drink    Family History Family  History  Problem Relation Age of Onset  . Diabetes Father   . Diabetes Brother   . Heart disease Brother   . Colon cancer Neg Hx   . Rectal cancer Neg Hx     No Known Allergies   REVIEW OF SYSTEMS  General: [ ]  Weight loss, [ ]  Fever, [ ]  chills Neurologic: [ ]  Dizziness, [ ]  Blackouts, [ ]  Seizure [ ]  Stroke, [ ]  "Mini stroke", [ ]  Slurred speech, [ ]  Temporary blindness; [ ]  weakness in arms or legs, [ ]  Hoarseness [ ]  Dysphagia Cardiac: [ ]  Chest pain/pressure, [ ]  Shortness of breath at rest [ ]  Shortness of breath with exertion, [ ]  Atrial fibrillation or irregular heartbeat  Vascular: [ ]  Pain in legs with walking, [ ]  Pain in legs at rest, [ ]  Pain in legs at night,  [ ]  Non-healing ulcer, [ ]  Blood clot in vein/DVT,   Pulmonary: [  ] Home oxygen, [ ]  Productive cough, [ ]  Coughing up blood, [ ]  Asthma,  [ ]  Wheezing [ ]  COPD Musculoskeletal:  [ ]  Arthritis, [ ]  Low back pain, [ ]  Joint pain Hematologic: [ ]  Easy Bruising, [x ] Anemia; [ ]  Hepatitis Gastrointestinal: [ ]  Blood in stool, [ ]  Gastroesophageal Reflux/heartburn, Urinary: [x ] chronic Kidney disease, [ ]  on HD - [ ]  MWF or [ ]  TTHS, [ ]  Burning with urination, [ ]  Difficulty urinating Skin: [ ]  Rashes, [ ]  Wounds Psychological: [ ]  Anxiety, [ ]  Depression  Physical Examination Vitals:   11/01/16 0923  BP: (!) 173/69  Pulse: 71  Resp: 18  Temp: 97.3 F (36.3 C)  TempSrc: Oral  SpO2: 100%   There is no height or weight on file to calculate BMI.  General:  WDWN in NAD Gait: Normal HENT: WNL Eyes: Pupils equal Pulmonary: normal non-labored breathing , without Rales, rhonchi,  wheezing Cardiac: RRR, without  Murmurs, rubs or gallops; No carotid bruits Abdomen: soft, NT, no masses Skin: no rashes, ulcers noted;  no Gangrene , no cellulitis; no open wounds;   Vascular Exam/Pulses:Left UE palpable thrill left BC fistula Palpable left radial pulse weak doppler signal radial ulnar and palmar  No muscle atrophy, grip grossly 5/5 equal bilaterally, sensation intact to light touch.   Musculoskeletal: no muscle wasting or atrophy; no edema  Neurologic: A&O X 3; Appropriate Affect ;  SENSATION: normal; MOTOR FUNCTION: 5/5 Symmetric Speech is fluent/normal   Significant Diagnostic Studies: CBC Lab Results  Component Value Date   WBC 5.6 04/03/2016   HGB 10.5 (L) 10/26/2016   HCT 34.0 (L) 07/10/2016   MCV 92.8 04/03/2016   PLT 201 04/03/2016    BMET    Component Value Date/Time   NA 139 07/10/2016 1219   NA 135 (L) 04/03/2016 1100   K 4.9 07/10/2016 1219   K 5.0 04/03/2016 1100   CL 108 03/19/2016 2045   CO2 21 (L) 04/03/2016 1100   GLUCOSE 95 07/10/2016 1219   GLUCOSE 125 04/03/2016 1100   BUN 51.6 (H) 04/03/2016 1100   CREATININE  3.7 (HH) 04/03/2016 1100   CALCIUM 8.9 04/03/2016 1100   GFRNONAA 13 (L) 03/19/2016 1140   GFRAA 15 (L) 03/19/2016 1140   CrCl cannot be calculated (Patient's most recent lab result is older than the maximum 21 days allowed.).  COAG Lab Results  Component Value Date   INR 1.14 10/06/2015   INR 1.01 03/09/2015     Non-Invasive Vascular Imaging:  ASSESSMENT/PLAN:  CKD s/p Left AV fistula creation 07/10/2016 Not on HD.  He was here for anemia blood work and injection HGB 10.6 He has a radial pulse that is palpable.  He has bluish discoloration of left hand digits 1, 2, and 4.  There is slight bluish discoloration to the digits 1 and 2 on the right finger tips as well.   This maybe raynaud's syndrome, but he will f/u in our office tomorrow for steal syndrome work up and see Dr. Bridgett Larsson.   Laurence Slate [facility] 11/01/2016 9:52 AM  Addendum  Pt will be seen tomorrow with L arm steal study.  Physical exam is not consistent with steal related ischemia, as pt has intact radial pulse along with sensation and motor in left hand.  Adele Barthel, MD, FACS [facility] Office: [phone] Pager: [phone]  11/01/2016, 11:11 AM

## 2016-11-02 ENCOUNTER — Other Ambulatory Visit: Payer: Self-pay | Admitting: *Deleted

## 2016-11-02 ENCOUNTER — Ambulatory Visit: Payer: Medicare Other | Admitting: Vascular Surgery

## 2016-11-02 DIAGNOSIS — Z992 Dependence on renal dialysis: Secondary | ICD-10-CM

## 2016-11-02 DIAGNOSIS — N186 End stage renal disease: Secondary | ICD-10-CM

## 2016-11-02 DIAGNOSIS — R208 Other disturbances of skin sensation: Secondary | ICD-10-CM

## 2016-11-05 ENCOUNTER — Encounter: Payer: Self-pay | Admitting: Vascular Surgery

## 2016-11-05 ENCOUNTER — Ambulatory Visit (HOSPITAL_COMMUNITY)
Admission: RE | Admit: 2016-11-05 | Discharge: 2016-11-05 | Disposition: A | Discharge status: Home / Self Care | Payer: Medicare Other | Source: Ambulatory Visit | Attending: Surgery | Admitting: Surgery

## 2016-11-05 DIAGNOSIS — N186 End stage renal disease: Secondary | ICD-10-CM | POA: Diagnosis not present

## 2016-11-05 DIAGNOSIS — Z992 Dependence on renal dialysis: Secondary | ICD-10-CM | POA: Insufficient documentation

## 2016-11-05 DIAGNOSIS — R208 Other disturbances of skin sensation: Secondary | ICD-10-CM | POA: Insufficient documentation

## 2016-11-06 NOTE — Progress Notes (Signed)
Established Dialysis Access  History of Present Illness  Lonnie [name] is a 82 y.o. (Dec 21, 1924) male who presents for re-evaluation of left BC AVF.  Since I last saw this patient he has developed some cyanosis in the L finger tips.  He was seen by my PA, Gerri Lins, PAC, on 11/01/16 was found to have no steal sx and palpable radial pulse.  He returns today for steal studies.  This patient baseline has some cyanosis in both hands.  He denies any arm weakness or numbness in fingers.  The patient remains off hemodialysis.  Past Medical History:  Diagnosis Date  . Adenomatous polyps   . Bursitis    left hip  . Chronic kidney disease    Stage 4  . Frequent urination at night   . GERD (gastroesophageal reflux disease)   . Hypertension   . Hypothyroidism   . Pneumonia   . Polyclonal gammopathy determined by serum protein electrophoresis 10/04/2015    Past Surgical History:  Procedure Laterality Date  . AV FISTULA PLACEMENT Left 07/10/2016   Procedure: LEFT BRACHIOCEPHALIC ARTERIOVENOUS (AV) FISTULA CREATION;  Surgeon: Conrad [employer], MD;  Location: [facility];  Service: Vascular;  Laterality: Left;  . COLONOSCOPY    . RENAL BIOPSY Left 10/06/2015  . SKIN SURGERY     back    Social History   Social History  . Marital status: Widowed    Spouse name: N/A  . Number of children: N/A  . Years of education: N/A   Occupational History  . Not on file.   Social History Main Topics  . Smoking status: Former Smoker    Quit date: 01/25/1980  . Smokeless tobacco: Never Used  . Alcohol use No     Comment: former alcoholic 32 years ago was last drink  . Drug use: No  . Sexual activity: Not on file   Other Topics Concern  . Not on file   Social History Narrative  . No narrative on file    Family History  Problem Relation Age of Onset  . Diabetes Father   . Diabetes Brother   . Heart disease Brother   . Colon cancer Neg Hx   . Rectal cancer Neg Hx     Current Outpatient  Prescriptions  Medication Sig Dispense Refill  . amLODipine (NORVASC) 5 MG tablet Take 1 tablet by mouth at bedtime.     [name] buPROPion (WELLBUTRIN) 75 MG tablet Take 75 mg by mouth daily.    . clotrimazole (LOTRIMIN) 1 % cream Apply 1 application topically 2 (two) times daily. Use of 7-10 days. (Patient taking differently: Apply 1 application topically 2 (two) times daily as needed. Use of 7-10 days.) 30 g 0  . escitalopram (LEXAPRO) 10 MG tablet Take 1 tablet by mouth daily.    . finasteride (PROSCAR) 5 MG tablet Take 5 mg by mouth daily.    [name] levothyroxine (SYNTHROID, LEVOTHROID) 88 MCG tablet Take 88 mcg by mouth at bedtime.    [name] oxyCODONE-acetaminophen (ROXICET) 5-325 MG tablet Take 1 tablet by mouth every 6 (six) hours as needed for moderate pain. 10 tablet 0  . polyvinyl alcohol (LIQUIFILM TEARS) 1.4 % ophthalmic solution Place 1-2 drops into both eyes as needed for dry eyes.    [name] psyllium (METAMUCIL) 58.6 % powder Take 1 packet by mouth at bedtime. Tablespoon and a half.    . ranitidine (ZANTAC) 150 MG capsule Take 150 mg by mouth 2 (two) times daily.    [name]  tamsulosin (FLOMAX) 0.4 MG CAPS capsule Take 1 capsule by mouth daily.     No current facility-administered medications for this visit.      No Known Allergies   REVIEW OF SYSTEMS:  (Positives checked otherwise negative)  CARDIOVASCULAR:   [ ]  chest pain,  [ ]  chest pressure,  [ ]  palpitations,  [ ]  shortness of breath when laying flat,  [ ]  shortness of breath with exertion,   [ ]  pain in feet when walking,  [ ]  pain in feet when laying flat, [ ]  history of blood clot in veins (DVT),  [ ]  history of phlebitis,  [ ]  swelling in legs,  [ ]  varicose veins  PULMONARY:   [ ]  productive cough,  [ ]  asthma,  [ ]  wheezing  NEUROLOGIC:   [ ]  weakness in arms or legs,  [ ]  numbness in arms or legs,  [ ]  difficulty speaking or slurred speech,  [ ]  temporary loss of vision in one eye,  [ ]  dizziness  HEMATOLOGIC:   [ ]   bleeding problems,  [ ]  problems with blood clotting too easily  MUSCULOSKEL:   [ ]  joint pain, [ ]  joint swelling  GASTROINTEST:   [ ]  vomiting blood,  [ ]  blood in stool     GENITOURINARY:   [ ]  burning with urination,  [ ]  blood in urine  PSYCHIATRIC:   [ ]  history of major depression  INTEGUMENTARY:   [ ]  rashes,  [ ]  ulcers [x]  Blue fingers: L>R  CONSTITUTIONAL:   [ ]  fever,  [ ]  chills   Physical Examination  Vitals:   11/09/16 0823 11/09/16 0825  BP: (!) 162/70 (!) 164/72  Pulse: 68   Resp: 20   Temp: 98.1 F (36.7 C)   TempSrc: Oral   SpO2: 100%   Weight: 166 lb 12.8 oz (75.7 kg)   Height: 5\' 8"  (1.727 m)    Body mass index is 25.36 kg/m.  General: A&O x 3, WD, WN  Pulmonary: Sym exp, good air movt, CTAB, no rales, rhonchi, & wheezing  Cardiac: RRR, Nl S1, S2, no Murmurs, rubs or gallops  Vascular: Vessel Right Left  Radial Palpable Palpable  Ulnar Faintly Palpable Faintly Palpable  Brachial Palpable Palpable   Gastrointestinal: soft, NTND, no G/R, bo HSM, no masses, no CVAT B  Musculoskeletal: M/S 5/5 throughout including 5/5 hand grip in left hand, Extremities without  ischemic changes except  Cyanosis in B finger tips: L>>R, palpable thrill in access in LUA, strong bruit in access  Neurologic: Pain and light touch intact in extremities, Motor exam as listed above  Non-Invasive Vascular Imaging  LUE Steal Study (Date: 11/06/2016):   L forearm pressure >30 mm Hg lower than R  50 mm Hg augmentation of radial pressure with fistula complression  Continued triphasic flow in radial and ulnar arteries even with fistula running   Medical Decision Making  Lonnie [name] is a 82 y.o. male who presents with chronic kidney disease stage IV-V, likely Raynauds, mild-mod hemodynamically significant steal in left hand, likely subclavian artery vs axillary stenosis   Patient is currently asx but I have concerns that inflow disease, along with  steal, and along some vasoconstriction from his Raynaud's will result in finger tip necrosis.  I recommended we consider L arm angiography, possible intervention to improve inflow to the left arm.  The patient is wary of proceed due to his failing renal function, so he declines.  We then  discussed: plication of L BC AVF to decrease flow in the left hand. Risk, benefits, and alternatives to access surgery were discussed.   The patient is aware the risks include but are not limited to: bleeding, infection, steal syndrome, thrombosis, nerve damage, ischemic monomelic neuropathy, failure to mature, need for additional procedures, death and stroke.   The patient is aware that this procedure can cause thrombosis in the fistula.  We have discussed that also sometimes its inadequate and subsequent ligation vs DRIL would have to be considered.  The patient has agreed to proceed with the above procedure which will be scheduled 11/13/16.   Adele Barthel, MD, FACS [facility] [place] Office: [phone] Pager: [phone]

## 2016-11-08 ENCOUNTER — Encounter (HOSPITAL_COMMUNITY)
Admission: RE | Admit: 2016-11-08 | Discharge: 2016-11-08 | Disposition: A | Discharge status: Home / Self Care | Payer: Medicare Other | Source: Ambulatory Visit | Attending: Nephrology | Admitting: Nephrology

## 2016-11-08 ENCOUNTER — Encounter: Payer: Self-pay | Admitting: Vascular Surgery

## 2016-11-08 DIAGNOSIS — D631 Anemia in chronic kidney disease: Secondary | ICD-10-CM | POA: Insufficient documentation

## 2016-11-08 DIAGNOSIS — Z79899 Other long term (current) drug therapy: Secondary | ICD-10-CM | POA: Insufficient documentation

## 2016-11-08 DIAGNOSIS — Z5181 Encounter for therapeutic drug level monitoring: Secondary | ICD-10-CM | POA: Insufficient documentation

## 2016-11-08 DIAGNOSIS — N184 Chronic kidney disease, stage 4 (severe): Secondary | ICD-10-CM | POA: Diagnosis not present

## 2016-11-08 LAB — POCT HEMOGLOBIN-HEMACUE: HEMOGLOBIN: 10.5 g/dL — AB (ref 13.0–17.0)

## 2016-11-08 MED ORDER — EPOETIN ALFA 10000 UNIT/ML IJ SOLN
5000.0000 [IU] | INTRAMUSCULAR | Status: DC
  Administered 2016-11-08: 5000 [IU] via SUBCUTANEOUS

## 2016-11-08 MED ORDER — EPOETIN ALFA 10000 UNIT/ML IJ SOLN
INTRAMUSCULAR | Status: AC
  Filled 2016-11-08: qty 1

## 2016-11-09 ENCOUNTER — Ambulatory Visit (INDEPENDENT_AMBULATORY_CARE_PROVIDER_SITE_OTHER): Payer: Medicare Other | Admitting: Vascular Surgery

## 2016-11-09 ENCOUNTER — Encounter: Payer: Self-pay | Admitting: Vascular Surgery

## 2016-11-09 ENCOUNTER — Other Ambulatory Visit: Payer: Self-pay

## 2016-11-09 VITALS — BP 164/72 | HR 68 | Temp 98.1°F | Resp 20 | Ht 68.0 in | Wt 166.8 lb

## 2016-11-09 DIAGNOSIS — N184 Chronic kidney disease, stage 4 (severe): Secondary | ICD-10-CM

## 2016-11-12 ENCOUNTER — Encounter (HOSPITAL_COMMUNITY): Payer: Self-pay | Admitting: *Deleted

## 2016-11-12 NOTE — Progress Notes (Signed)
Spoke with pt for pre-op call. Pt denies cardiac history, chest pain or sob. 

## 2016-11-13 ENCOUNTER — Encounter (HOSPITAL_COMMUNITY): Payer: Self-pay | Admitting: Urology

## 2016-11-13 ENCOUNTER — Encounter (HOSPITAL_COMMUNITY)
Admission: RE | Disposition: A | Discharge status: Home / Self Care | Payer: Self-pay | Source: Ambulatory Visit | Attending: Vascular Surgery

## 2016-11-13 ENCOUNTER — Ambulatory Visit (HOSPITAL_COMMUNITY): Payer: Medicare Other | Admitting: Certified Registered Nurse Anesthetist

## 2016-11-13 ENCOUNTER — Ambulatory Visit (HOSPITAL_COMMUNITY)
Admission: RE | Admit: 2016-11-13 | Discharge: 2016-11-13 | Disposition: A | Discharge status: Home / Self Care | Payer: Medicare Other | Source: Ambulatory Visit | Attending: Vascular Surgery | Admitting: Vascular Surgery

## 2016-11-13 DIAGNOSIS — E039 Hypothyroidism, unspecified: Secondary | ICD-10-CM | POA: Diagnosis not present

## 2016-11-13 DIAGNOSIS — Z87891 Personal history of nicotine dependence: Secondary | ICD-10-CM | POA: Insufficient documentation

## 2016-11-13 DIAGNOSIS — K219 Gastro-esophageal reflux disease without esophagitis: Secondary | ICD-10-CM | POA: Diagnosis not present

## 2016-11-13 DIAGNOSIS — I129 Hypertensive chronic kidney disease with stage 1 through stage 4 chronic kidney disease, or unspecified chronic kidney disease: Secondary | ICD-10-CM | POA: Diagnosis not present

## 2016-11-13 DIAGNOSIS — R809 Proteinuria, unspecified: Secondary | ICD-10-CM | POA: Diagnosis not present

## 2016-11-13 DIAGNOSIS — T82898A Other specified complication of vascular prosthetic devices, implants and grafts, initial encounter: Secondary | ICD-10-CM | POA: Diagnosis not present

## 2016-11-13 DIAGNOSIS — Z79899 Other long term (current) drug therapy: Secondary | ICD-10-CM | POA: Diagnosis not present

## 2016-11-13 DIAGNOSIS — I7300 Raynaud's syndrome without gangrene: Secondary | ICD-10-CM | POA: Diagnosis not present

## 2016-11-13 DIAGNOSIS — N189 Chronic kidney disease, unspecified: Secondary | ICD-10-CM | POA: Diagnosis not present

## 2016-11-13 DIAGNOSIS — N184 Chronic kidney disease, stage 4 (severe): Secondary | ICD-10-CM | POA: Diagnosis not present

## 2016-11-13 DIAGNOSIS — F329 Major depressive disorder, single episode, unspecified: Secondary | ICD-10-CM | POA: Diagnosis not present

## 2016-11-13 HISTORY — PX: REVISON OF ARTERIOVENOUS FISTULA: SHX6074

## 2016-11-13 LAB — POCT I-STAT 4, (NA,K, GLUC, HGB,HCT)
GLUCOSE: 89 mg/dL (ref 65–99)
HEMATOCRIT: 33 % — AB (ref 39.0–52.0)
HEMOGLOBIN: 11.2 g/dL — AB (ref 13.0–17.0)
POTASSIUM: 4.5 mmol/L (ref 3.5–5.1)
SODIUM: 141 mmol/L (ref 135–145)

## 2016-11-13 SURGERY — REVISON OF ARTERIOVENOUS FISTULA
Anesthesia: Monitor Anesthesia Care | Site: Arm Upper | Laterality: Left

## 2016-11-13 MED ORDER — FENTANYL CITRATE (PF) 100 MCG/2ML IJ SOLN: 25.0000 ug | INTRAMUSCULAR | Status: DC | PRN

## 2016-11-13 MED ORDER — 0.9 % SODIUM CHLORIDE (POUR BTL) OPTIME
TOPICAL | Status: DC | PRN
  Administered 2016-11-13: 1000 mL

## 2016-11-13 MED ORDER — HEMOSTATIC AGENTS (NO CHARGE) OPTIME
TOPICAL | Status: DC | PRN
  Administered 2016-11-13: 1 via TOPICAL

## 2016-11-13 MED ORDER — DEXTROSE 5 % IV SOLN
1.5000 g | INTRAVENOUS | Status: AC
  Administered 2016-11-13: 1.5 g via INTRAVENOUS

## 2016-11-13 MED ORDER — FENTANYL CITRATE (PF) 100 MCG/2ML IJ SOLN
INTRAMUSCULAR | Status: DC | PRN
Start: 2016-11-13 — End: 2016-11-13
  Administered 2016-11-13 (×2): 50 ug via INTRAVENOUS

## 2016-11-13 MED ORDER — DEXTROSE 5 % IV SOLN
INTRAVENOUS | Status: AC
  Filled 2016-11-13: qty 1.5

## 2016-11-13 MED ORDER — CHLORHEXIDINE GLUCONATE CLOTH 2 % EX PADS: 6.0000 | MEDICATED_PAD | Freq: Once | CUTANEOUS | Status: DC

## 2016-11-13 MED ORDER — SODIUM CHLORIDE 0.9 % IV SOLN: INTRAVENOUS | Status: DC

## 2016-11-13 MED ORDER — MIDAZOLAM HCL 5 MG/5ML IJ SOLN
INTRAMUSCULAR | Status: DC | PRN
  Administered 2016-11-13 (×2): 1 mg via INTRAVENOUS

## 2016-11-13 MED ORDER — OXYCODONE-ACETAMINOPHEN 5-325 MG PO TABS: 1.0000 | ORAL_TABLET | Freq: Four times a day (QID) | ORAL | 0 refills | Status: DC | PRN

## 2016-11-13 MED ORDER — MIDAZOLAM HCL 2 MG/2ML IJ SOLN
INTRAMUSCULAR | Status: AC
  Filled 2016-11-13: qty 2

## 2016-11-13 MED ORDER — FENTANYL CITRATE (PF) 100 MCG/2ML IJ SOLN
INTRAMUSCULAR | Status: AC
  Filled 2016-11-13: qty 2

## 2016-11-13 MED ORDER — LIDOCAINE HCL (PF) 1 % IJ SOLN
INTRAMUSCULAR | Status: DC | PRN
  Administered 2016-11-13: 2 mL

## 2016-11-13 MED ORDER — PROPOFOL 500 MG/50ML IV EMUL
INTRAVENOUS | Status: DC | PRN
  Administered 2016-11-13: 50 ug/kg/min via INTRAVENOUS

## 2016-11-13 MED ORDER — LIDOCAINE HCL (PF) 1 % IJ SOLN
INTRAMUSCULAR | Status: AC
  Filled 2016-11-13: qty 30

## 2016-11-13 MED ORDER — SODIUM CHLORIDE 0.9 % IV SOLN
INTRAVENOUS | Status: DC
  Administered 2016-11-13 (×2): via INTRAVENOUS

## 2016-11-13 MED ORDER — SODIUM CHLORIDE 0.9 % IV SOLN
INTRAVENOUS | Status: DC | PRN
  Administered 2016-11-13: 11:00:00

## 2016-11-13 SURGICAL SUPPLY — 30 items
ARMBAND PINK RESTRICT EXTREMIT (MISCELLANEOUS) ×3 IMPLANT
CANISTER SUCTION 2500CC (MISCELLANEOUS) ×3 IMPLANT
CLIP TI MEDIUM 6 (CLIP) ×3 IMPLANT
CLIP TI WIDE RED SMALL 6 (CLIP) ×3 IMPLANT
COVER PROBE W GEL 5X96 (DRAPES) IMPLANT
DECANTER SPIKE VIAL GLASS SM (MISCELLANEOUS) ×3 IMPLANT
DERMABOND ADVANCED (GAUZE/BANDAGES/DRESSINGS) ×2
DERMABOND ADVANCED .7 DNX12 (GAUZE/BANDAGES/DRESSINGS) ×1 IMPLANT
DRAIN PENROSE 1/2X12 LTX STRL (WOUND CARE) IMPLANT
ELECT REM PT RETURN 9FT ADLT (ELECTROSURGICAL) ×3
ELECTRODE REM PT RTRN 9FT ADLT (ELECTROSURGICAL) ×1 IMPLANT
GLOVE BIO SURGEON STRL SZ7 (GLOVE) ×3 IMPLANT
GLOVE BIOGEL PI IND STRL 7.5 (GLOVE) ×1 IMPLANT
GLOVE BIOGEL PI INDICATOR 7.5 (GLOVE) ×2
GOWN STRL REUS W/ TWL LRG LVL3 (GOWN DISPOSABLE) ×3 IMPLANT
GOWN STRL REUS W/TWL LRG LVL3 (GOWN DISPOSABLE) ×6
HEMOSTAT SPONGE AVITENE ULTRA (HEMOSTASIS) ×3 IMPLANT
KIT BASIN OR (CUSTOM PROCEDURE TRAY) ×3 IMPLANT
KIT ROOM TURNOVER OR (KITS) ×3 IMPLANT
NS IRRIG 1000ML POUR BTL (IV SOLUTION) ×3 IMPLANT
PACK CV ACCESS (CUSTOM PROCEDURE TRAY) ×3 IMPLANT
PAD ARMBOARD 7.5X6 YLW CONV (MISCELLANEOUS) ×6 IMPLANT
SUT MNCRL AB 4-0 PS2 18 (SUTURE) ×3 IMPLANT
SUT PROLENE 5 0 C 1 24 (SUTURE) ×3 IMPLANT
SUT PROLENE 6 0 BV (SUTURE) IMPLANT
SUT PROLENE 7 0 BV 1 (SUTURE) ×3 IMPLANT
SUT VIC AB 3-0 SH 27 (SUTURE) ×2
SUT VIC AB 3-0 SH 27X BRD (SUTURE) ×1 IMPLANT
UNDERPAD 30X30 (UNDERPADS AND DIAPERS) ×3 IMPLANT
WATER STERILE IRR 1000ML POUR (IV SOLUTION) ×3 IMPLANT

## 2016-11-13 NOTE — Anesthesia Preprocedure Evaluation (Addendum)
Anesthesia Evaluation  Patient identified by MRN, date of birth, ID band Patient awake    Reviewed: Allergy & Precautions, NPO status , Patient's Chart, lab work & pertinent test results  Airway Mallampati: II  TM Distance: >3 FB Neck ROM: Full    Dental no notable dental hx.    Pulmonary former smoker,    Pulmonary exam normal breath sounds clear to auscultation       Cardiovascular hypertension, Pt. on medications Normal cardiovascular exam Rhythm:Regular Rate:Normal     Neuro/Psych PSYCHIATRIC DISORDERS Depression negative neurological ROS     GI/Hepatic Neg liver ROS, GERD  ,  Endo/Other  Hypothyroidism   Renal/GU CRFRenal disease     Musculoskeletal negative musculoskeletal ROS (+)   Abdominal   Peds  Hematology negative hematology ROS (+)   Anesthesia Other Findings   Reproductive/Obstetrics negative OB ROS                            Anesthesia Physical  Anesthesia Plan  ASA: III  Anesthesia Plan: MAC   Post-op Pain Management:    Induction: Intravenous  Airway Management Planned:   Additional Equipment:   Intra-op Plan:   Post-operative Plan:   Informed Consent: I have reviewed the patients History and Physical, chart, labs and discussed the procedure including the risks, benefits and alternatives for the proposed anesthesia with the patient or authorized representative who has indicated his/her understanding and acceptance.   Dental advisory given  Plan Discussed with: CRNA  Anesthesia Plan Comments:         Anesthesia Quick Evaluation

## 2016-11-13 NOTE — Op Note (Signed)
    OPERATIVE NOTE   PROCEDURE: 1. Plication of left brachiocephalic arteriovenous fistula   PRE-OPERATIVE DIAGNOSIS: Raynaud's syndrome, steal syndrome, likely left subclavian artery stenosis  POST-OPERATIVE DIAGNOSIS: same as above   SURGEON: Adele Barthel, MD  ASSISTANT(S): Gerri Lins, PAC   ANESTHESIA: local and MAC  ESTIMATED BLOOD LOSS: 30 cc  FINDING(S): 1. Improved pulse in left radial artery 2. Decreased palpable thrill at end of the case  SPECIMEN(S):  none  INDICATIONS:   Lonnie [name] is a 82 y.o. male who  presents with blue (L>>R) fingers in setting of likely Raynaud's syndrome, hemodynamically steal, and likely left subclavian artery stenosis.  The patient defer angiography of the left arm as he was concerned that he would become end stage renal from the contrast load.  I felt this patient was at risk for finger tip ischemia given the three contributing factors, so I offered him plication of the left brachiocephalic arteriovenous fistula  To limit the flow rate in the fistula.  Risk, benefits, and alternatives to access surgery were discussed.  The patient is aware the risks include but are not limited to: bleeding, infection, steal syndrome, thrombosis, nerve damage, ischemic monomelic neuropathy, failure to mature, need for additional procedures, thrombosis of the access, death and stroke.  I made it clear to him that thrombosis of the fistula was possible given the nature of a plication of an access inflow.  The patient agrees to proceed forward with the procedure.   DESCRIPTION: After obtaining full informed written consent, the patient was brought back to the operating room and placed supine upon the operating table.  The patient received IV antibiotics prior to induction.  After obtaining adequate anesthesia, the patient was prepped and draped in the standard fashion for: left arm access procedure.  I made longitudinal incision over the distal fistula.  I was  able to easily dissect the distal fistula down to the anastomosis.  The fistula appeared to be 5-5.5 mm in diameter.  I clamped the distal fistula with a Cooley and then plicated the distal fistula with a double layer of 6-0 Prolene.  This appeared to reduce the lumen to 4.5 mm.  Distally the radial pulse was unchanged from previously, and there remained a strong vigorous thrill in the fistula so I felt that another round of plication was needed.  I plicated the same distal 2 cm of the fistula with a double layer of 5-0 Prolene.  Distally, I could feel an augmented radial pulse and decreased thrill in the fistula.    I packed the incision with Avitene.  I reapproximated the subcutaneous tissue immediately above the fistula with a running stitch of 3-0 Vicryl.  The skin was reapproximated with a running subcuticular of 4-0 Monocryl.  The skin was cleaned, dried, and the skin reinforced with Dermabond.  The patient continued to have a a palpable thrill in the fistula at the end of the case.   COMPLICATIONS: none  CONDITION: stable   Adele Barthel, MD, [facility] [place] Office: [phone] Pager: [phone]  11/13/2016, 12:10 PM

## 2016-11-13 NOTE — Interval H&P Note (Signed)
History and Physical Interval Note:  11/13/2016 8:30 AM  Lonnie [name]  has presented today for surgery, with the diagnosis of Stage IV Chronic Kidney Disease N18.4  The various methods of treatment have been discussed with the patient and family. After consideration of risks, benefits and other options for treatment, the patient has consented to  Procedure(s): PLICATION OF BRACHIOCEPHALIC ARTERIOVENOUS FISTULA (Left) as a surgical intervention .  The patient's history has been reviewed, patient examined, no change in status, stable for surgery.  I have reviewed the patient's chart and labs.  Questions were answered to the patient's satisfaction.     Adele Barthel

## 2016-11-13 NOTE — Anesthesia Postprocedure Evaluation (Signed)
Anesthesia Post Note  Patient: Lonnie [name]  Procedure(s) Performed: Procedure(s) (LRB): PLICATION OF LEFT BRACHIOCEPHALIC ARTERIOVENOUS FISTULA (Left)  Patient location during evaluation: PACU Anesthesia Type: MAC Level of consciousness: awake and alert Pain management: pain level controlled Vital Signs Assessment: post-procedure vital signs reviewed and stable Respiratory status: spontaneous breathing, nonlabored ventilation, respiratory function stable and patient connected to nasal cannula oxygen Cardiovascular status: stable and blood pressure returned to baseline Anesthetic complications: no    Last Vitals:  Vitals:   11/13/16 1358 11/13/16 1430  BP:  (!) 176/69  Pulse:  61  Resp:  14  Temp: 36.7 C     Last Pain:  Vitals:   11/13/16 1430  TempSrc:   PainSc: (P) 0-No pain                 Tiajuana Amass

## 2016-11-13 NOTE — H&P (View-Only) (Signed)
Established Dialysis Access  History of Present Illness  Lonnie [name] is a 82 y.o. (1925/07/02) male who presents for re-evaluation of left BC AVF.  Since I last saw this patient he has developed some cyanosis in the L finger tips.  He was seen by my PA, Gerri Lins, PAC, on 11/01/16 was found to have no steal sx and palpable radial pulse.  He returns today for steal studies.  This patient baseline has some cyanosis in both hands.  He denies any arm weakness or numbness in fingers.  The patient remains off hemodialysis.  Past Medical History:  Diagnosis Date  . Adenomatous polyps   . Bursitis    left hip  . Chronic kidney disease    Stage 4  . Frequent urination at night   . GERD (gastroesophageal reflux disease)   . Hypertension   . Hypothyroidism   . Pneumonia   . Polyclonal gammopathy determined by serum protein electrophoresis 10/04/2015    Past Surgical History:  Procedure Laterality Date  . AV FISTULA PLACEMENT Left 07/10/2016   Procedure: LEFT BRACHIOCEPHALIC ARTERIOVENOUS (AV) FISTULA CREATION;  Surgeon: Conrad Wooldridge, MD;  Location: [facility];  Service: Vascular;  Laterality: Left;  . COLONOSCOPY    . RENAL BIOPSY Left 10/06/2015  . SKIN SURGERY     back    Social History   Social History  . Marital status: Widowed    Spouse name: N/A  . Number of children: N/A  . Years of education: N/A   Occupational History  . Not on file.   Social History Main Topics  . Smoking status: Former Smoker    Quit date: 01/25/1980  . Smokeless tobacco: Never Used  . Alcohol use No     Comment: former alcoholic 32 years ago was last drink  . Drug use: No  . Sexual activity: Not on file   Other Topics Concern  . Not on file   Social History Narrative  . No narrative on file    Family History  Problem Relation Age of Onset  . Diabetes Father   . Diabetes Brother   . Heart disease Brother   . Colon cancer Neg Hx   . Rectal cancer Neg Hx     Current Outpatient  Prescriptions  Medication Sig Dispense Refill  . amLODipine (NORVASC) 5 MG tablet Take 1 tablet by mouth at bedtime.     [name] buPROPion (WELLBUTRIN) 75 MG tablet Take 75 mg by mouth daily.    . clotrimazole (LOTRIMIN) 1 % cream Apply 1 application topically 2 (two) times daily. Use of 7-10 days. (Patient taking differently: Apply 1 application topically 2 (two) times daily as needed. Use of 7-10 days.) 30 g 0  . escitalopram (LEXAPRO) 10 MG tablet Take 1 tablet by mouth daily.    . finasteride (PROSCAR) 5 MG tablet Take 5 mg by mouth daily.    [name] levothyroxine (SYNTHROID, LEVOTHROID) 88 MCG tablet Take 88 mcg by mouth at bedtime.    [name] oxyCODONE-acetaminophen (ROXICET) 5-325 MG tablet Take 1 tablet by mouth every 6 (six) hours as needed for moderate pain. 10 tablet 0  . polyvinyl alcohol (LIQUIFILM TEARS) 1.4 % ophthalmic solution Place 1-2 drops into both eyes as needed for dry eyes.    [name] psyllium (METAMUCIL) 58.6 % powder Take 1 packet by mouth at bedtime. Tablespoon and a half.    . ranitidine (ZANTAC) 150 MG capsule Take 150 mg by mouth 2 (two) times daily.    [name]  tamsulosin (FLOMAX) 0.4 MG CAPS capsule Take 1 capsule by mouth daily.     No current facility-administered medications for this visit.      No Known Allergies   REVIEW OF SYSTEMS:  (Positives checked otherwise negative)  CARDIOVASCULAR:   [ ]  chest pain,  [ ]  chest pressure,  [ ]  palpitations,  [ ]  shortness of breath when laying flat,  [ ]  shortness of breath with exertion,   [ ]  pain in feet when walking,  [ ]  pain in feet when laying flat, [ ]  history of blood clot in veins (DVT),  [ ]  history of phlebitis,  [ ]  swelling in legs,  [ ]  varicose veins  PULMONARY:   [ ]  productive cough,  [ ]  asthma,  [ ]  wheezing  NEUROLOGIC:   [ ]  weakness in arms or legs,  [ ]  numbness in arms or legs,  [ ]  difficulty speaking or slurred speech,  [ ]  temporary loss of vision in one eye,  [ ]  dizziness  HEMATOLOGIC:   [ ]   bleeding problems,  [ ]  problems with blood clotting too easily  MUSCULOSKEL:   [ ]  joint pain, [ ]  joint swelling  GASTROINTEST:   [ ]  vomiting blood,  [ ]  blood in stool     GENITOURINARY:   [ ]  burning with urination,  [ ]  blood in urine  PSYCHIATRIC:   [ ]  history of major depression  INTEGUMENTARY:   [ ]  rashes,  [ ]  ulcers [x]  Blue fingers: L>R  CONSTITUTIONAL:   [ ]  fever,  [ ]  chills   Physical Examination  Vitals:   11/09/16 0823 11/09/16 0825  BP: (!) 162/70 (!) 164/72  Pulse: 68   Resp: 20   Temp: 98.1 F (36.7 C)   TempSrc: Oral   SpO2: 100%   Weight: 166 lb 12.8 oz (75.7 kg)   Height: 5\' 8"  (1.727 m)    Body mass index is 25.36 kg/m.  General: A&O x 3, WD, WN  Pulmonary: Sym exp, good air movt, CTAB, no rales, rhonchi, & wheezing  Cardiac: RRR, Nl S1, S2, no Murmurs, rubs or gallops  Vascular: Vessel Right Left  Radial Palpable Palpable  Ulnar Faintly Palpable Faintly Palpable  Brachial Palpable Palpable   Gastrointestinal: soft, NTND, no G/R, bo HSM, no masses, no CVAT B  Musculoskeletal: M/S 5/5 throughout including 5/5 hand grip in left hand, Extremities without  ischemic changes except  Cyanosis in B finger tips: L>>R, palpable thrill in access in LUA, strong bruit in access  Neurologic: Pain and light touch intact in extremities, Motor exam as listed above  Non-Invasive Vascular Imaging  LUE Steal Study (Date: 11/06/2016):   L forearm pressure >30 mm Hg lower than R  50 mm Hg augmentation of radial pressure with fistula complression  Continued triphasic flow in radial and ulnar arteries even with fistula running   Medical Decision Making  [name] is a 82 y.o. male who presents with chronic kidney disease stage IV-V, likely Raynauds, mild-mod hemodynamically significant steal in left hand, likely subclavian artery vs axillary stenosis   Patient is currently asx but I have concerns that inflow disease, along with  steal, and along some vasoconstriction from his Raynaud's will result in finger tip necrosis.  I recommended we consider L arm angiography, possible intervention to improve inflow to the left arm.  The patient is wary of proceed due to his failing renal function, so he declines.  We then  discussed: plication of L BC AVF to decrease flow in the left hand. Risk, benefits, and alternatives to access surgery were discussed.   The patient is aware the risks include but are not limited to: bleeding, infection, steal syndrome, thrombosis, nerve damage, ischemic monomelic neuropathy, failure to mature, need for additional procedures, death and stroke.   The patient is aware that this procedure can cause thrombosis in the fistula.  We have discussed that also sometimes its inadequate and subsequent ligation vs DRIL would have to be considered.  The patient has agreed to proceed with the above procedure which will be scheduled 11/13/16.   Adele Barthel, MD, FACS [facility] [place] Office: [phone] Pager: [phone]

## 2016-11-13 NOTE — Transfer of Care (Signed)
Immediate Anesthesia Transfer of Care Note  Patient: Lonnie [name]  Procedure(s) Performed: Procedure(s): PLICATION OF LEFT BRACHIOCEPHALIC ARTERIOVENOUS FISTULA (Left)  Patient Location: PACU  Anesthesia Type:MAC  Level of Consciousness: sedated  Airway & Oxygen Therapy: Patient Spontanous Breathing and Patient connected to face mask oxygen  Post-op Assessment: Report given to RN and Post -op Vital signs reviewed and stable  Post vital signs: Reviewed and stable  Last Vitals:  Vitals:   11/13/16 0847 11/13/16 1226  BP: (!) 160/66 140/66  Pulse: 73 60  Resp: 18 12  Temp: 36.5 C 36.4 C    Last Pain:  Vitals:   11/13/16 0847  TempSrc: Oral         Complications: No apparent anesthesia complications

## 2016-11-14 ENCOUNTER — Encounter (HOSPITAL_COMMUNITY): Payer: Self-pay | Admitting: Vascular Surgery

## 2016-11-15 ENCOUNTER — Encounter (HOSPITAL_COMMUNITY)
Admission: RE | Admit: 2016-11-15 | Discharge: 2016-11-15 | Disposition: A | Discharge status: Home / Self Care | Payer: Medicare Other | Source: Ambulatory Visit | Attending: Nephrology | Admitting: Nephrology

## 2016-11-15 DIAGNOSIS — Z5181 Encounter for therapeutic drug level monitoring: Secondary | ICD-10-CM | POA: Diagnosis not present

## 2016-11-15 DIAGNOSIS — N184 Chronic kidney disease, stage 4 (severe): Secondary | ICD-10-CM | POA: Diagnosis not present

## 2016-11-15 DIAGNOSIS — D631 Anemia in chronic kidney disease: Secondary | ICD-10-CM | POA: Diagnosis not present

## 2016-11-15 DIAGNOSIS — Z79899 Other long term (current) drug therapy: Secondary | ICD-10-CM | POA: Diagnosis not present

## 2016-11-15 LAB — POCT HEMOGLOBIN-HEMACUE: HEMOGLOBIN: 10.4 g/dL — AB (ref 13.0–17.0)

## 2016-11-15 MED ORDER — EPOETIN ALFA 10000 UNIT/ML IJ SOLN
INTRAMUSCULAR | Status: AC
  Filled 2016-11-15: qty 1

## 2016-11-15 MED ORDER — EPOETIN ALFA 10000 UNIT/ML IJ SOLN
5000.0000 [IU] | INTRAMUSCULAR | Status: DC
  Administered 2016-11-15: 5000 [IU] via SUBCUTANEOUS

## 2016-11-22 ENCOUNTER — Encounter (HOSPITAL_COMMUNITY)
Admission: RE | Admit: 2016-11-22 | Discharge: 2016-11-22 | Disposition: A | Discharge status: Home / Self Care | Payer: Medicare Other | Source: Ambulatory Visit | Attending: Nephrology | Admitting: Nephrology

## 2016-11-22 DIAGNOSIS — N184 Chronic kidney disease, stage 4 (severe): Secondary | ICD-10-CM | POA: Diagnosis not present

## 2016-11-22 DIAGNOSIS — Z5181 Encounter for therapeutic drug level monitoring: Secondary | ICD-10-CM | POA: Diagnosis not present

## 2016-11-22 DIAGNOSIS — Z79899 Other long term (current) drug therapy: Secondary | ICD-10-CM | POA: Diagnosis not present

## 2016-11-22 DIAGNOSIS — D631 Anemia in chronic kidney disease: Secondary | ICD-10-CM | POA: Diagnosis not present

## 2016-11-22 LAB — IRON AND TIBC
Iron: 40 ug/dL — ABNORMAL LOW (ref 45–182)
Saturation Ratios: 14 % — ABNORMAL LOW (ref 17.9–39.5)
TIBC: 277 ug/dL (ref 250–450)
UIBC: 237 ug/dL

## 2016-11-22 LAB — FERRITIN: Ferritin: 75 ng/mL (ref 24–336)

## 2016-11-22 LAB — POCT HEMOGLOBIN-HEMACUE: HEMOGLOBIN: 10.9 g/dL — AB (ref 13.0–17.0)

## 2016-11-22 MED ORDER — EPOETIN ALFA 10000 UNIT/ML IJ SOLN
INTRAMUSCULAR | Status: AC
  Filled 2016-11-22: qty 1

## 2016-11-22 MED ORDER — EPOETIN ALFA 10000 UNIT/ML IJ SOLN
5000.0000 [IU] | INTRAMUSCULAR | Status: DC
  Administered 2016-11-22: 5000 [IU] via SUBCUTANEOUS

## 2016-11-29 ENCOUNTER — Encounter (HOSPITAL_COMMUNITY)
Admission: RE | Admit: 2016-11-29 | Discharge: 2016-11-29 | Disposition: A | Discharge status: Home / Self Care | Payer: Medicare Other | Source: Ambulatory Visit | Attending: Nephrology | Admitting: Nephrology

## 2016-11-29 DIAGNOSIS — N184 Chronic kidney disease, stage 4 (severe): Secondary | ICD-10-CM | POA: Diagnosis not present

## 2016-11-29 DIAGNOSIS — D631 Anemia in chronic kidney disease: Secondary | ICD-10-CM | POA: Diagnosis not present

## 2016-11-29 DIAGNOSIS — Z5181 Encounter for therapeutic drug level monitoring: Secondary | ICD-10-CM | POA: Diagnosis not present

## 2016-11-29 DIAGNOSIS — Z79899 Other long term (current) drug therapy: Secondary | ICD-10-CM | POA: Diagnosis not present

## 2016-11-29 LAB — POCT HEMOGLOBIN-HEMACUE: HEMOGLOBIN: 10.6 g/dL — AB (ref 13.0–17.0)

## 2016-11-29 MED ORDER — EPOETIN ALFA 10000 UNIT/ML IJ SOLN
5000.0000 [IU] | INTRAMUSCULAR | Status: DC
  Administered 2016-11-29: 10:00:00 5000 [IU] via SUBCUTANEOUS

## 2016-11-29 MED ORDER — EPOETIN ALFA 10000 UNIT/ML IJ SOLN
INTRAMUSCULAR | Status: AC
  Filled 2016-11-29: qty 1

## 2016-12-07 ENCOUNTER — Ambulatory Visit (HOSPITAL_COMMUNITY)
Admission: RE | Admit: 2016-12-07 | Discharge: 2016-12-07 | Disposition: A | Discharge status: Home / Self Care | Payer: Medicare Other | Source: Ambulatory Visit | Attending: Nephrology | Admitting: Nephrology

## 2016-12-07 DIAGNOSIS — Z79899 Other long term (current) drug therapy: Secondary | ICD-10-CM | POA: Insufficient documentation

## 2016-12-07 DIAGNOSIS — Z5181 Encounter for therapeutic drug level monitoring: Secondary | ICD-10-CM | POA: Diagnosis not present

## 2016-12-07 DIAGNOSIS — D631 Anemia in chronic kidney disease: Secondary | ICD-10-CM | POA: Insufficient documentation

## 2016-12-07 DIAGNOSIS — N184 Chronic kidney disease, stage 4 (severe): Secondary | ICD-10-CM | POA: Diagnosis not present

## 2016-12-07 DIAGNOSIS — D649 Anemia, unspecified: Secondary | ICD-10-CM | POA: Diagnosis not present

## 2016-12-07 DIAGNOSIS — N400 Enlarged prostate without lower urinary tract symptoms: Secondary | ICD-10-CM | POA: Diagnosis not present

## 2016-12-07 DIAGNOSIS — R600 Localized edema: Secondary | ICD-10-CM | POA: Diagnosis not present

## 2016-12-07 DIAGNOSIS — Z6825 Body mass index (BMI) 25.0-25.9, adult: Secondary | ICD-10-CM | POA: Diagnosis not present

## 2016-12-07 LAB — POCT HEMOGLOBIN-HEMACUE: HEMOGLOBIN: 10.5 g/dL — AB (ref 13.0–17.0)

## 2016-12-07 MED ORDER — EPOETIN ALFA 10000 UNIT/ML IJ SOLN
INTRAMUSCULAR | Status: AC
  Filled 2016-12-07: qty 1

## 2016-12-07 MED ORDER — EPOETIN ALFA 10000 UNIT/ML IJ SOLN
5000.0000 [IU] | INTRAMUSCULAR | Status: DC
  Administered 2016-12-07: 5000 [IU] via SUBCUTANEOUS

## 2016-12-14 ENCOUNTER — Encounter (HOSPITAL_COMMUNITY)
Admission: RE | Admit: 2016-12-14 | Discharge: 2016-12-14 | Disposition: A | Discharge status: Home / Self Care | Payer: Medicare Other | Source: Ambulatory Visit | Attending: Nephrology | Admitting: Nephrology

## 2016-12-14 DIAGNOSIS — D631 Anemia in chronic kidney disease: Secondary | ICD-10-CM | POA: Insufficient documentation

## 2016-12-14 DIAGNOSIS — N184 Chronic kidney disease, stage 4 (severe): Secondary | ICD-10-CM | POA: Insufficient documentation

## 2016-12-14 DIAGNOSIS — Z5181 Encounter for therapeutic drug level monitoring: Secondary | ICD-10-CM | POA: Diagnosis not present

## 2016-12-14 DIAGNOSIS — Z79899 Other long term (current) drug therapy: Secondary | ICD-10-CM | POA: Diagnosis not present

## 2016-12-14 LAB — POCT HEMOGLOBIN-HEMACUE: Hemoglobin: 9.9 g/dL — ABNORMAL LOW (ref 13.0–17.0)

## 2016-12-14 MED ORDER — EPOETIN ALFA 10000 UNIT/ML IJ SOLN
5000.0000 [IU] | INTRAMUSCULAR | Status: DC
Start: 2016-12-14 — End: 2016-12-15
  Administered 2016-12-14: 09:00:00 5000 [IU] via SUBCUTANEOUS

## 2016-12-14 MED ORDER — EPOETIN ALFA 10000 UNIT/ML IJ SOLN
INTRAMUSCULAR | Status: AC
  Filled 2016-12-14: qty 1

## 2016-12-17 DIAGNOSIS — R3915 Urgency of urination: Secondary | ICD-10-CM | POA: Diagnosis not present

## 2016-12-17 DIAGNOSIS — N400 Enlarged prostate without lower urinary tract symptoms: Secondary | ICD-10-CM | POA: Diagnosis not present

## 2016-12-17 DIAGNOSIS — N3281 Overactive bladder: Secondary | ICD-10-CM | POA: Diagnosis not present

## 2016-12-18 ENCOUNTER — Encounter: Payer: Self-pay | Admitting: Vascular Surgery

## 2016-12-21 ENCOUNTER — Ambulatory Visit (INDEPENDENT_AMBULATORY_CARE_PROVIDER_SITE_OTHER): Payer: Medicare Other | Admitting: Vascular Surgery

## 2016-12-21 ENCOUNTER — Encounter: Payer: Self-pay | Admitting: Vascular Surgery

## 2016-12-21 ENCOUNTER — Inpatient Hospital Stay (HOSPITAL_COMMUNITY): Admission: RE | Admit: 2016-12-21 | Payer: Medicare Other | Source: Ambulatory Visit

## 2016-12-21 VITALS — BP 168/76 | HR 68 | Temp 97.2°F | Resp 20 | Ht 68.0 in | Wt 173.4 lb

## 2016-12-21 DIAGNOSIS — N184 Chronic kidney disease, stage 4 (severe): Secondary | ICD-10-CM

## 2016-12-21 NOTE — Progress Notes (Signed)
    Postoperative Access Visit   History of Present Illness  Lonnie [name] is a 83 y.o. year old male who presents for postoperative follow-up for: plication of L BC AVF (Date: 11/13/16).  The patient's wounds are healed.  The patient notes no steal symptoms.  The patient is able to complete their activities of daily living.  The patient's current symptoms are: none.  For VQI Use Only  PRE-ADM LIVING: Home  AMB STATUS: Ambulatory  Physical Examination Vitals:   12/21/16 1136 12/21/16 1137  BP: (!) 164/77 (!) 168/76  Pulse: 68   Resp: 20   Temp: 97.2 F (36.2 C)     LUE: Incision is healed, forearm feels warm, both hands feel cool, cyanosis in L hand is improved, strong radial pulse, strong thrill in L BC AVF, +bruit, healed incision   Medical Decision Making  Lonnie [name] is a 83 y.o. year old male who presents s/p L BC AVF and subsequent plication.   Again patient may have some L SCA stenosis but pt not interested in possible intervention at this point given risk of precipitating  ESRD.  Pt's hand discoloration is improved and pulse appears to be stronger after plication, so would hold off on any further interventions.  The patient's access is ready for use.  Thank you for allowing [place] to participate in this patient's care.  Adele Barthel, MD, FACS [facility] [place] Office: [phone] Pager: [phone]

## 2016-12-28 ENCOUNTER — Encounter (HOSPITAL_COMMUNITY)
Admission: RE | Admit: 2016-12-28 | Discharge: 2016-12-28 | Disposition: A | Discharge status: Home / Self Care | Payer: Medicare Other | Source: Ambulatory Visit | Attending: Nephrology | Admitting: Nephrology

## 2016-12-28 DIAGNOSIS — D631 Anemia in chronic kidney disease: Secondary | ICD-10-CM | POA: Diagnosis not present

## 2016-12-28 DIAGNOSIS — Z5181 Encounter for therapeutic drug level monitoring: Secondary | ICD-10-CM | POA: Diagnosis not present

## 2016-12-28 DIAGNOSIS — N184 Chronic kidney disease, stage 4 (severe): Secondary | ICD-10-CM | POA: Diagnosis not present

## 2016-12-28 DIAGNOSIS — Z79899 Other long term (current) drug therapy: Secondary | ICD-10-CM | POA: Diagnosis not present

## 2016-12-28 LAB — IRON AND TIBC
IRON: 43 ug/dL — AB (ref 45–182)
Saturation Ratios: 16 % — ABNORMAL LOW (ref 17.9–39.5)
TIBC: 277 ug/dL (ref 250–450)
UIBC: 234 ug/dL

## 2016-12-28 LAB — FERRITIN: Ferritin: 91 ng/mL (ref 24–336)

## 2016-12-28 LAB — POCT HEMOGLOBIN-HEMACUE: Hemoglobin: 10 g/dL — ABNORMAL LOW (ref 13.0–17.0)

## 2016-12-28 MED ORDER — EPOETIN ALFA 10000 UNIT/ML IJ SOLN
5000.0000 [IU] | INTRAMUSCULAR | Status: DC
Start: 2016-12-28 — End: 2016-12-29
  Administered 2016-12-28: 5000 [IU] via SUBCUTANEOUS

## 2016-12-28 MED ORDER — EPOETIN ALFA 10000 UNIT/ML IJ SOLN
INTRAMUSCULAR | Status: AC
  Administered 2016-12-28: 5000 [IU] via SUBCUTANEOUS
  Filled 2016-12-28: qty 1

## 2017-01-03 DIAGNOSIS — N2581 Secondary hyperparathyroidism of renal origin: Secondary | ICD-10-CM | POA: Diagnosis not present

## 2017-01-03 DIAGNOSIS — N184 Chronic kidney disease, stage 4 (severe): Secondary | ICD-10-CM | POA: Diagnosis not present

## 2017-01-03 DIAGNOSIS — E039 Hypothyroidism, unspecified: Secondary | ICD-10-CM | POA: Diagnosis not present

## 2017-01-03 DIAGNOSIS — D472 Monoclonal gammopathy: Secondary | ICD-10-CM | POA: Diagnosis not present

## 2017-01-03 DIAGNOSIS — Z6826 Body mass index (BMI) 26.0-26.9, adult: Secondary | ICD-10-CM | POA: Diagnosis not present

## 2017-01-03 DIAGNOSIS — R809 Proteinuria, unspecified: Secondary | ICD-10-CM | POA: Diagnosis not present

## 2017-01-03 DIAGNOSIS — D631 Anemia in chronic kidney disease: Secondary | ICD-10-CM | POA: Diagnosis not present

## 2017-01-03 DIAGNOSIS — N189 Chronic kidney disease, unspecified: Secondary | ICD-10-CM | POA: Diagnosis not present

## 2017-01-04 ENCOUNTER — Encounter (HOSPITAL_COMMUNITY): Payer: Medicare Other

## 2017-01-11 ENCOUNTER — Encounter (HOSPITAL_COMMUNITY)
Admission: RE | Admit: 2017-01-11 | Discharge: 2017-01-11 | Disposition: A | Discharge status: Home / Self Care | Payer: Medicare Other | Source: Ambulatory Visit | Attending: Nephrology | Admitting: Nephrology

## 2017-01-11 DIAGNOSIS — N184 Chronic kidney disease, stage 4 (severe): Secondary | ICD-10-CM

## 2017-01-11 DIAGNOSIS — Z79899 Other long term (current) drug therapy: Secondary | ICD-10-CM | POA: Insufficient documentation

## 2017-01-11 DIAGNOSIS — Z5181 Encounter for therapeutic drug level monitoring: Secondary | ICD-10-CM | POA: Insufficient documentation

## 2017-01-11 DIAGNOSIS — D631 Anemia in chronic kidney disease: Secondary | ICD-10-CM | POA: Insufficient documentation

## 2017-01-11 LAB — POCT HEMOGLOBIN-HEMACUE: Hemoglobin: 10.4 g/dL — ABNORMAL LOW (ref 13.0–17.0)

## 2017-01-11 MED ORDER — EPOETIN ALFA 10000 UNIT/ML IJ SOLN
5000.0000 [IU] | INTRAMUSCULAR | Status: DC
Start: 2017-01-11 — End: 2017-01-12
  Administered 2017-01-11: 08:00:00 5000 [IU] via SUBCUTANEOUS

## 2017-01-11 MED ORDER — EPOETIN ALFA 10000 UNIT/ML IJ SOLN
INTRAMUSCULAR | Status: AC
Start: 2017-01-11 — End: 2017-01-11
  Filled 2017-01-11: qty 1

## 2017-01-18 DIAGNOSIS — N183 Chronic kidney disease, stage 3 (moderate): Secondary | ICD-10-CM | POA: Diagnosis not present

## 2017-01-25 ENCOUNTER — Inpatient Hospital Stay (HOSPITAL_COMMUNITY): Admission: RE | Admit: 2017-01-25 | Payer: Medicare Other | Source: Ambulatory Visit

## 2017-01-25 DIAGNOSIS — N184 Chronic kidney disease, stage 4 (severe): Secondary | ICD-10-CM | POA: Diagnosis not present

## 2017-01-31 DIAGNOSIS — N2581 Secondary hyperparathyroidism of renal origin: Secondary | ICD-10-CM | POA: Diagnosis not present

## 2017-01-31 DIAGNOSIS — D472 Monoclonal gammopathy: Secondary | ICD-10-CM | POA: Diagnosis not present

## 2017-01-31 DIAGNOSIS — Z23 Encounter for immunization: Secondary | ICD-10-CM | POA: Diagnosis not present

## 2017-01-31 DIAGNOSIS — N186 End stage renal disease: Secondary | ICD-10-CM | POA: Diagnosis not present

## 2017-01-31 DIAGNOSIS — N185 Chronic kidney disease, stage 5: Secondary | ICD-10-CM | POA: Diagnosis not present

## 2017-01-31 DIAGNOSIS — D631 Anemia in chronic kidney disease: Secondary | ICD-10-CM | POA: Diagnosis not present

## 2017-02-02 DIAGNOSIS — N2581 Secondary hyperparathyroidism of renal origin: Secondary | ICD-10-CM | POA: Diagnosis not present

## 2017-02-02 DIAGNOSIS — D472 Monoclonal gammopathy: Secondary | ICD-10-CM | POA: Diagnosis not present

## 2017-02-02 DIAGNOSIS — D631 Anemia in chronic kidney disease: Secondary | ICD-10-CM | POA: Diagnosis not present

## 2017-02-02 DIAGNOSIS — N185 Chronic kidney disease, stage 5: Secondary | ICD-10-CM | POA: Diagnosis not present

## 2017-02-02 DIAGNOSIS — Z23 Encounter for immunization: Secondary | ICD-10-CM | POA: Diagnosis not present

## 2017-02-02 DIAGNOSIS — N186 End stage renal disease: Secondary | ICD-10-CM | POA: Diagnosis not present

## 2017-02-05 DIAGNOSIS — N2581 Secondary hyperparathyroidism of renal origin: Secondary | ICD-10-CM | POA: Diagnosis not present

## 2017-02-05 DIAGNOSIS — Z23 Encounter for immunization: Secondary | ICD-10-CM | POA: Diagnosis not present

## 2017-02-05 DIAGNOSIS — D472 Monoclonal gammopathy: Secondary | ICD-10-CM | POA: Diagnosis not present

## 2017-02-05 DIAGNOSIS — N186 End stage renal disease: Secondary | ICD-10-CM | POA: Diagnosis not present

## 2017-02-05 DIAGNOSIS — N185 Chronic kidney disease, stage 5: Secondary | ICD-10-CM | POA: Diagnosis not present

## 2017-02-05 DIAGNOSIS — D631 Anemia in chronic kidney disease: Secondary | ICD-10-CM | POA: Diagnosis not present

## 2017-02-07 DIAGNOSIS — D472 Monoclonal gammopathy: Secondary | ICD-10-CM | POA: Diagnosis not present

## 2017-02-07 DIAGNOSIS — D631 Anemia in chronic kidney disease: Secondary | ICD-10-CM | POA: Diagnosis not present

## 2017-02-07 DIAGNOSIS — N185 Chronic kidney disease, stage 5: Secondary | ICD-10-CM | POA: Diagnosis not present

## 2017-02-07 DIAGNOSIS — N186 End stage renal disease: Secondary | ICD-10-CM | POA: Diagnosis not present

## 2017-02-07 DIAGNOSIS — Z23 Encounter for immunization: Secondary | ICD-10-CM | POA: Diagnosis not present

## 2017-02-07 DIAGNOSIS — N2581 Secondary hyperparathyroidism of renal origin: Secondary | ICD-10-CM | POA: Diagnosis not present

## 2017-02-09 DIAGNOSIS — Z23 Encounter for immunization: Secondary | ICD-10-CM | POA: Diagnosis not present

## 2017-02-09 DIAGNOSIS — N186 End stage renal disease: Secondary | ICD-10-CM | POA: Diagnosis not present

## 2017-02-09 DIAGNOSIS — N185 Chronic kidney disease, stage 5: Secondary | ICD-10-CM | POA: Diagnosis not present

## 2017-02-09 DIAGNOSIS — D472 Monoclonal gammopathy: Secondary | ICD-10-CM | POA: Diagnosis not present

## 2017-02-09 DIAGNOSIS — N2581 Secondary hyperparathyroidism of renal origin: Secondary | ICD-10-CM | POA: Diagnosis not present

## 2017-02-09 DIAGNOSIS — D631 Anemia in chronic kidney disease: Secondary | ICD-10-CM | POA: Diagnosis not present

## 2017-02-12 DIAGNOSIS — N186 End stage renal disease: Secondary | ICD-10-CM | POA: Diagnosis not present

## 2017-02-12 DIAGNOSIS — D472 Monoclonal gammopathy: Secondary | ICD-10-CM | POA: Diagnosis not present

## 2017-02-12 DIAGNOSIS — Z23 Encounter for immunization: Secondary | ICD-10-CM | POA: Diagnosis not present

## 2017-02-12 DIAGNOSIS — N185 Chronic kidney disease, stage 5: Secondary | ICD-10-CM | POA: Diagnosis not present

## 2017-02-12 DIAGNOSIS — D631 Anemia in chronic kidney disease: Secondary | ICD-10-CM | POA: Diagnosis not present

## 2017-02-12 DIAGNOSIS — N2581 Secondary hyperparathyroidism of renal origin: Secondary | ICD-10-CM | POA: Diagnosis not present

## 2017-02-13 DIAGNOSIS — N186 End stage renal disease: Secondary | ICD-10-CM | POA: Diagnosis not present

## 2017-02-13 DIAGNOSIS — N2581 Secondary hyperparathyroidism of renal origin: Secondary | ICD-10-CM | POA: Diagnosis not present

## 2017-02-13 DIAGNOSIS — D472 Monoclonal gammopathy: Secondary | ICD-10-CM | POA: Diagnosis not present

## 2017-02-13 DIAGNOSIS — N185 Chronic kidney disease, stage 5: Secondary | ICD-10-CM | POA: Diagnosis not present

## 2017-02-13 DIAGNOSIS — D631 Anemia in chronic kidney disease: Secondary | ICD-10-CM | POA: Diagnosis not present

## 2017-02-13 DIAGNOSIS — Z23 Encounter for immunization: Secondary | ICD-10-CM | POA: Diagnosis not present

## 2017-02-15 DIAGNOSIS — D631 Anemia in chronic kidney disease: Secondary | ICD-10-CM | POA: Diagnosis not present

## 2017-02-15 DIAGNOSIS — D472 Monoclonal gammopathy: Secondary | ICD-10-CM | POA: Diagnosis not present

## 2017-02-15 DIAGNOSIS — Z23 Encounter for immunization: Secondary | ICD-10-CM | POA: Diagnosis not present

## 2017-02-15 DIAGNOSIS — N185 Chronic kidney disease, stage 5: Secondary | ICD-10-CM | POA: Diagnosis not present

## 2017-02-15 DIAGNOSIS — N2581 Secondary hyperparathyroidism of renal origin: Secondary | ICD-10-CM | POA: Diagnosis not present

## 2017-02-15 DIAGNOSIS — N186 End stage renal disease: Secondary | ICD-10-CM | POA: Diagnosis not present

## 2017-02-18 DIAGNOSIS — N2581 Secondary hyperparathyroidism of renal origin: Secondary | ICD-10-CM | POA: Diagnosis not present

## 2017-02-18 DIAGNOSIS — Z23 Encounter for immunization: Secondary | ICD-10-CM | POA: Diagnosis not present

## 2017-02-18 DIAGNOSIS — N185 Chronic kidney disease, stage 5: Secondary | ICD-10-CM | POA: Diagnosis not present

## 2017-02-18 DIAGNOSIS — D631 Anemia in chronic kidney disease: Secondary | ICD-10-CM | POA: Diagnosis not present

## 2017-02-18 DIAGNOSIS — N186 End stage renal disease: Secondary | ICD-10-CM | POA: Diagnosis not present

## 2017-02-18 DIAGNOSIS — D472 Monoclonal gammopathy: Secondary | ICD-10-CM | POA: Diagnosis not present

## 2017-02-20 DIAGNOSIS — D472 Monoclonal gammopathy: Secondary | ICD-10-CM | POA: Diagnosis not present

## 2017-02-20 DIAGNOSIS — N186 End stage renal disease: Secondary | ICD-10-CM | POA: Diagnosis not present

## 2017-02-20 DIAGNOSIS — Z23 Encounter for immunization: Secondary | ICD-10-CM | POA: Diagnosis not present

## 2017-02-20 DIAGNOSIS — N185 Chronic kidney disease, stage 5: Secondary | ICD-10-CM | POA: Diagnosis not present

## 2017-02-20 DIAGNOSIS — N2581 Secondary hyperparathyroidism of renal origin: Secondary | ICD-10-CM | POA: Diagnosis not present

## 2017-02-20 DIAGNOSIS — D631 Anemia in chronic kidney disease: Secondary | ICD-10-CM | POA: Diagnosis not present

## 2017-02-22 DIAGNOSIS — D631 Anemia in chronic kidney disease: Secondary | ICD-10-CM | POA: Diagnosis not present

## 2017-02-22 DIAGNOSIS — N2581 Secondary hyperparathyroidism of renal origin: Secondary | ICD-10-CM | POA: Diagnosis not present

## 2017-02-22 DIAGNOSIS — Z23 Encounter for immunization: Secondary | ICD-10-CM | POA: Diagnosis not present

## 2017-02-22 DIAGNOSIS — N186 End stage renal disease: Secondary | ICD-10-CM | POA: Diagnosis not present

## 2017-02-22 DIAGNOSIS — D472 Monoclonal gammopathy: Secondary | ICD-10-CM | POA: Diagnosis not present

## 2017-02-22 DIAGNOSIS — N185 Chronic kidney disease, stage 5: Secondary | ICD-10-CM | POA: Diagnosis not present

## 2017-02-25 DIAGNOSIS — D631 Anemia in chronic kidney disease: Secondary | ICD-10-CM | POA: Diagnosis not present

## 2017-02-25 DIAGNOSIS — N185 Chronic kidney disease, stage 5: Secondary | ICD-10-CM | POA: Diagnosis not present

## 2017-02-25 DIAGNOSIS — N2581 Secondary hyperparathyroidism of renal origin: Secondary | ICD-10-CM | POA: Diagnosis not present

## 2017-02-25 DIAGNOSIS — Z23 Encounter for immunization: Secondary | ICD-10-CM | POA: Diagnosis not present

## 2017-02-25 DIAGNOSIS — D472 Monoclonal gammopathy: Secondary | ICD-10-CM | POA: Diagnosis not present

## 2017-02-25 DIAGNOSIS — N186 End stage renal disease: Secondary | ICD-10-CM | POA: Diagnosis not present

## 2017-03-01 DIAGNOSIS — Z23 Encounter for immunization: Secondary | ICD-10-CM | POA: Diagnosis not present

## 2017-03-01 DIAGNOSIS — D472 Monoclonal gammopathy: Secondary | ICD-10-CM | POA: Diagnosis not present

## 2017-03-01 DIAGNOSIS — N2581 Secondary hyperparathyroidism of renal origin: Secondary | ICD-10-CM | POA: Diagnosis not present

## 2017-03-01 DIAGNOSIS — N185 Chronic kidney disease, stage 5: Secondary | ICD-10-CM | POA: Diagnosis not present

## 2017-03-01 DIAGNOSIS — D631 Anemia in chronic kidney disease: Secondary | ICD-10-CM | POA: Diagnosis not present

## 2017-03-01 DIAGNOSIS — N186 End stage renal disease: Secondary | ICD-10-CM | POA: Diagnosis not present

## 2017-03-02 DIAGNOSIS — Z992 Dependence on renal dialysis: Secondary | ICD-10-CM | POA: Diagnosis not present

## 2017-03-02 DIAGNOSIS — I129 Hypertensive chronic kidney disease with stage 1 through stage 4 chronic kidney disease, or unspecified chronic kidney disease: Secondary | ICD-10-CM | POA: Diagnosis not present

## 2017-03-02 DIAGNOSIS — N186 End stage renal disease: Secondary | ICD-10-CM | POA: Diagnosis not present

## 2017-03-06 DIAGNOSIS — D631 Anemia in chronic kidney disease: Secondary | ICD-10-CM | POA: Diagnosis not present

## 2017-03-06 DIAGNOSIS — N2581 Secondary hyperparathyroidism of renal origin: Secondary | ICD-10-CM | POA: Diagnosis not present

## 2017-03-06 DIAGNOSIS — N186 End stage renal disease: Secondary | ICD-10-CM | POA: Diagnosis not present

## 2017-03-06 DIAGNOSIS — D472 Monoclonal gammopathy: Secondary | ICD-10-CM | POA: Diagnosis not present

## 2017-03-06 DIAGNOSIS — Z992 Dependence on renal dialysis: Secondary | ICD-10-CM | POA: Diagnosis not present

## 2017-03-08 DIAGNOSIS — Z992 Dependence on renal dialysis: Secondary | ICD-10-CM | POA: Diagnosis not present

## 2017-03-08 DIAGNOSIS — N186 End stage renal disease: Secondary | ICD-10-CM | POA: Diagnosis not present

## 2017-03-08 DIAGNOSIS — N2581 Secondary hyperparathyroidism of renal origin: Secondary | ICD-10-CM | POA: Diagnosis not present

## 2017-03-08 DIAGNOSIS — D472 Monoclonal gammopathy: Secondary | ICD-10-CM | POA: Diagnosis not present

## 2017-03-08 DIAGNOSIS — D631 Anemia in chronic kidney disease: Secondary | ICD-10-CM | POA: Diagnosis not present

## 2017-03-11 DIAGNOSIS — N186 End stage renal disease: Secondary | ICD-10-CM | POA: Diagnosis not present

## 2017-03-11 DIAGNOSIS — D631 Anemia in chronic kidney disease: Secondary | ICD-10-CM | POA: Diagnosis not present

## 2017-03-11 DIAGNOSIS — N2581 Secondary hyperparathyroidism of renal origin: Secondary | ICD-10-CM | POA: Diagnosis not present

## 2017-03-11 DIAGNOSIS — Z992 Dependence on renal dialysis: Secondary | ICD-10-CM | POA: Diagnosis not present

## 2017-03-11 DIAGNOSIS — D472 Monoclonal gammopathy: Secondary | ICD-10-CM | POA: Diagnosis not present

## 2017-03-13 DIAGNOSIS — N2581 Secondary hyperparathyroidism of renal origin: Secondary | ICD-10-CM | POA: Diagnosis not present

## 2017-03-13 DIAGNOSIS — N186 End stage renal disease: Secondary | ICD-10-CM | POA: Diagnosis not present

## 2017-03-13 DIAGNOSIS — D631 Anemia in chronic kidney disease: Secondary | ICD-10-CM | POA: Diagnosis not present

## 2017-03-13 DIAGNOSIS — D472 Monoclonal gammopathy: Secondary | ICD-10-CM | POA: Diagnosis not present

## 2017-03-13 DIAGNOSIS — Z992 Dependence on renal dialysis: Secondary | ICD-10-CM | POA: Diagnosis not present

## 2017-03-15 DIAGNOSIS — D631 Anemia in chronic kidney disease: Secondary | ICD-10-CM | POA: Diagnosis not present

## 2017-03-15 DIAGNOSIS — N186 End stage renal disease: Secondary | ICD-10-CM | POA: Diagnosis not present

## 2017-03-15 DIAGNOSIS — N2581 Secondary hyperparathyroidism of renal origin: Secondary | ICD-10-CM | POA: Diagnosis not present

## 2017-03-15 DIAGNOSIS — D472 Monoclonal gammopathy: Secondary | ICD-10-CM | POA: Diagnosis not present

## 2017-03-15 DIAGNOSIS — Z992 Dependence on renal dialysis: Secondary | ICD-10-CM | POA: Diagnosis not present

## 2017-03-18 DIAGNOSIS — D472 Monoclonal gammopathy: Secondary | ICD-10-CM | POA: Diagnosis not present

## 2017-03-18 DIAGNOSIS — N2581 Secondary hyperparathyroidism of renal origin: Secondary | ICD-10-CM | POA: Diagnosis not present

## 2017-03-18 DIAGNOSIS — D631 Anemia in chronic kidney disease: Secondary | ICD-10-CM | POA: Diagnosis not present

## 2017-03-18 DIAGNOSIS — N186 End stage renal disease: Secondary | ICD-10-CM | POA: Diagnosis not present

## 2017-03-18 DIAGNOSIS — Z992 Dependence on renal dialysis: Secondary | ICD-10-CM | POA: Diagnosis not present

## 2017-03-20 DIAGNOSIS — D472 Monoclonal gammopathy: Secondary | ICD-10-CM | POA: Diagnosis not present

## 2017-03-20 DIAGNOSIS — Z992 Dependence on renal dialysis: Secondary | ICD-10-CM | POA: Diagnosis not present

## 2017-03-20 DIAGNOSIS — N2581 Secondary hyperparathyroidism of renal origin: Secondary | ICD-10-CM | POA: Diagnosis not present

## 2017-03-20 DIAGNOSIS — D631 Anemia in chronic kidney disease: Secondary | ICD-10-CM | POA: Diagnosis not present

## 2017-03-20 DIAGNOSIS — N186 End stage renal disease: Secondary | ICD-10-CM | POA: Diagnosis not present

## 2017-03-22 DIAGNOSIS — N186 End stage renal disease: Secondary | ICD-10-CM | POA: Diagnosis not present

## 2017-03-22 DIAGNOSIS — D472 Monoclonal gammopathy: Secondary | ICD-10-CM | POA: Diagnosis not present

## 2017-03-22 DIAGNOSIS — Z992 Dependence on renal dialysis: Secondary | ICD-10-CM | POA: Diagnosis not present

## 2017-03-22 DIAGNOSIS — D631 Anemia in chronic kidney disease: Secondary | ICD-10-CM | POA: Diagnosis not present

## 2017-03-22 DIAGNOSIS — N2581 Secondary hyperparathyroidism of renal origin: Secondary | ICD-10-CM | POA: Diagnosis not present

## 2017-03-25 DIAGNOSIS — D631 Anemia in chronic kidney disease: Secondary | ICD-10-CM | POA: Diagnosis not present

## 2017-03-25 DIAGNOSIS — N186 End stage renal disease: Secondary | ICD-10-CM | POA: Diagnosis not present

## 2017-03-25 DIAGNOSIS — D472 Monoclonal gammopathy: Secondary | ICD-10-CM | POA: Diagnosis not present

## 2017-03-25 DIAGNOSIS — N2581 Secondary hyperparathyroidism of renal origin: Secondary | ICD-10-CM | POA: Diagnosis not present

## 2017-03-25 DIAGNOSIS — Z992 Dependence on renal dialysis: Secondary | ICD-10-CM | POA: Diagnosis not present

## 2017-03-27 DIAGNOSIS — D472 Monoclonal gammopathy: Secondary | ICD-10-CM | POA: Diagnosis not present

## 2017-03-27 DIAGNOSIS — N2581 Secondary hyperparathyroidism of renal origin: Secondary | ICD-10-CM | POA: Diagnosis not present

## 2017-03-27 DIAGNOSIS — N186 End stage renal disease: Secondary | ICD-10-CM | POA: Diagnosis not present

## 2017-03-27 DIAGNOSIS — Z992 Dependence on renal dialysis: Secondary | ICD-10-CM | POA: Diagnosis not present

## 2017-03-27 DIAGNOSIS — D631 Anemia in chronic kidney disease: Secondary | ICD-10-CM | POA: Diagnosis not present

## 2017-03-29 DIAGNOSIS — D472 Monoclonal gammopathy: Secondary | ICD-10-CM | POA: Diagnosis not present

## 2017-03-29 DIAGNOSIS — Z992 Dependence on renal dialysis: Secondary | ICD-10-CM | POA: Diagnosis not present

## 2017-03-29 DIAGNOSIS — D631 Anemia in chronic kidney disease: Secondary | ICD-10-CM | POA: Diagnosis not present

## 2017-03-29 DIAGNOSIS — N186 End stage renal disease: Secondary | ICD-10-CM | POA: Diagnosis not present

## 2017-03-29 DIAGNOSIS — N2581 Secondary hyperparathyroidism of renal origin: Secondary | ICD-10-CM | POA: Diagnosis not present

## 2017-04-01 DIAGNOSIS — Z992 Dependence on renal dialysis: Secondary | ICD-10-CM | POA: Diagnosis not present

## 2017-04-01 DIAGNOSIS — N186 End stage renal disease: Secondary | ICD-10-CM | POA: Diagnosis not present

## 2017-04-01 DIAGNOSIS — I129 Hypertensive chronic kidney disease with stage 1 through stage 4 chronic kidney disease, or unspecified chronic kidney disease: Secondary | ICD-10-CM | POA: Diagnosis not present

## 2017-04-01 DIAGNOSIS — D631 Anemia in chronic kidney disease: Secondary | ICD-10-CM | POA: Diagnosis not present

## 2017-04-01 DIAGNOSIS — N2581 Secondary hyperparathyroidism of renal origin: Secondary | ICD-10-CM | POA: Diagnosis not present

## 2017-04-01 DIAGNOSIS — D472 Monoclonal gammopathy: Secondary | ICD-10-CM | POA: Diagnosis not present

## 2017-04-03 DIAGNOSIS — N186 End stage renal disease: Secondary | ICD-10-CM | POA: Diagnosis not present

## 2017-04-03 DIAGNOSIS — Z23 Encounter for immunization: Secondary | ICD-10-CM | POA: Diagnosis not present

## 2017-04-03 DIAGNOSIS — Z992 Dependence on renal dialysis: Secondary | ICD-10-CM | POA: Diagnosis not present

## 2017-04-03 DIAGNOSIS — N2581 Secondary hyperparathyroidism of renal origin: Secondary | ICD-10-CM | POA: Diagnosis not present

## 2017-04-03 DIAGNOSIS — D631 Anemia in chronic kidney disease: Secondary | ICD-10-CM | POA: Diagnosis not present

## 2017-04-03 DIAGNOSIS — D472 Monoclonal gammopathy: Secondary | ICD-10-CM | POA: Diagnosis not present

## 2017-04-05 DIAGNOSIS — N2581 Secondary hyperparathyroidism of renal origin: Secondary | ICD-10-CM | POA: Diagnosis not present

## 2017-04-05 DIAGNOSIS — Z992 Dependence on renal dialysis: Secondary | ICD-10-CM | POA: Diagnosis not present

## 2017-04-05 DIAGNOSIS — D472 Monoclonal gammopathy: Secondary | ICD-10-CM | POA: Diagnosis not present

## 2017-04-05 DIAGNOSIS — N186 End stage renal disease: Secondary | ICD-10-CM | POA: Diagnosis not present

## 2017-04-05 DIAGNOSIS — Z23 Encounter for immunization: Secondary | ICD-10-CM | POA: Diagnosis not present

## 2017-04-05 DIAGNOSIS — D631 Anemia in chronic kidney disease: Secondary | ICD-10-CM | POA: Diagnosis not present

## 2017-04-08 DIAGNOSIS — Z992 Dependence on renal dialysis: Secondary | ICD-10-CM | POA: Diagnosis not present

## 2017-04-08 DIAGNOSIS — N2581 Secondary hyperparathyroidism of renal origin: Secondary | ICD-10-CM | POA: Diagnosis not present

## 2017-04-08 DIAGNOSIS — N186 End stage renal disease: Secondary | ICD-10-CM | POA: Diagnosis not present

## 2017-04-08 DIAGNOSIS — Z23 Encounter for immunization: Secondary | ICD-10-CM | POA: Diagnosis not present

## 2017-04-08 DIAGNOSIS — D631 Anemia in chronic kidney disease: Secondary | ICD-10-CM | POA: Diagnosis not present

## 2017-04-08 DIAGNOSIS — D472 Monoclonal gammopathy: Secondary | ICD-10-CM | POA: Diagnosis not present

## 2017-04-09 ENCOUNTER — Other Ambulatory Visit (HOSPITAL_BASED_OUTPATIENT_CLINIC_OR_DEPARTMENT_OTHER): Payer: Medicare Other

## 2017-04-09 DIAGNOSIS — D890 Polyclonal hypergammaglobulinemia: Secondary | ICD-10-CM

## 2017-04-09 LAB — CBC WITH DIFFERENTIAL/PLATELET
BASO%: 0.9 % (ref 0.0–2.0)
Basophils Absolute: 0 10*3/uL (ref 0.0–0.1)
EOS%: 8.6 % — ABNORMAL HIGH (ref 0.0–7.0)
Eosinophils Absolute: 0.4 10*3/uL (ref 0.0–0.5)
HEMATOCRIT: 36.1 % — AB (ref 38.4–49.9)
HGB: 11.8 g/dL — ABNORMAL LOW (ref 13.0–17.1)
LYMPH#: 0.7 10*3/uL — AB (ref 0.9–3.3)
LYMPH%: 13 % — ABNORMAL LOW (ref 14.0–49.0)
MCH: 31 pg (ref 27.2–33.4)
MCHC: 32.6 g/dL (ref 32.0–36.0)
MCV: 95.1 fL (ref 79.3–98.0)
MONO#: 0.4 10*3/uL (ref 0.1–0.9)
MONO%: 8.8 % (ref 0.0–14.0)
NEUT%: 68.7 % (ref 39.0–75.0)
NEUTROS ABS: 3.5 10*3/uL (ref 1.5–6.5)
PLATELETS: 256 10*3/uL (ref 140–400)
RBC: 3.79 10*6/uL — ABNORMAL LOW (ref 4.20–5.82)
RDW: 15.9 % — ABNORMAL HIGH (ref 11.0–14.6)
WBC: 5.1 10*3/uL (ref 4.0–10.3)

## 2017-04-09 LAB — COMPREHENSIVE METABOLIC PANEL
ALT: 10 U/L (ref 0–55)
ANION GAP: 10 meq/L (ref 3–11)
AST: 18 U/L (ref 5–34)
Albumin: 3.7 g/dL (ref 3.5–5.0)
Alkaline Phosphatase: 123 U/L (ref 40–150)
BILIRUBIN TOTAL: 0.47 mg/dL (ref 0.20–1.20)
BUN: 26.9 mg/dL — ABNORMAL HIGH (ref 7.0–26.0)
CALCIUM: 9.2 mg/dL (ref 8.4–10.4)
CO2: 27 mEq/L (ref 22–29)
CREATININE: 4 mg/dL — AB (ref 0.7–1.3)
Chloride: 100 mEq/L (ref 98–109)
EGFR: 12 mL/min/{1.73_m2} — ABNORMAL LOW (ref >90)
Glucose: 99 mg/dl (ref 70–140)
Potassium: 4.5 mEq/L (ref 3.5–5.1)
Sodium: 138 mEq/L (ref 136–145)
TOTAL PROTEIN: 7.7 g/dL (ref 6.4–8.3)

## 2017-04-09 LAB — LACTATE DEHYDROGENASE: LDH: 170 U/L (ref 125–245)

## 2017-04-10 DIAGNOSIS — N2581 Secondary hyperparathyroidism of renal origin: Secondary | ICD-10-CM | POA: Diagnosis not present

## 2017-04-10 DIAGNOSIS — Z23 Encounter for immunization: Secondary | ICD-10-CM | POA: Diagnosis not present

## 2017-04-10 DIAGNOSIS — D631 Anemia in chronic kidney disease: Secondary | ICD-10-CM | POA: Diagnosis not present

## 2017-04-10 DIAGNOSIS — Z992 Dependence on renal dialysis: Secondary | ICD-10-CM | POA: Diagnosis not present

## 2017-04-10 DIAGNOSIS — D472 Monoclonal gammopathy: Secondary | ICD-10-CM | POA: Diagnosis not present

## 2017-04-10 DIAGNOSIS — N186 End stage renal disease: Secondary | ICD-10-CM | POA: Diagnosis not present

## 2017-04-10 LAB — BETA 2 MICROGLOBULIN, SERUM: BETA 2: 14.2 mg/L — AB (ref 0.6–2.4)

## 2017-04-10 LAB — IGG, IGA, IGM
IgA, Qn, Serum: 275 mg/dL (ref 61–437)
IgG, Qn, Serum: 1377 mg/dL (ref 700–1600)
IgM, Qn, Serum: 74 mg/dL (ref 15–143)

## 2017-04-10 LAB — KAPPA/LAMBDA LIGHT CHAINS
Ig Kappa Free Light Chain: 210.1 mg/L — ABNORMAL HIGH (ref 3.3–19.4)
Ig Lambda Free Light Chain: 153.6 mg/L — ABNORMAL HIGH (ref 5.7–26.3)
KAPPA/LAMBDA FLC RATIO: 1.37 (ref 0.26–1.65)

## 2017-04-12 DIAGNOSIS — N2581 Secondary hyperparathyroidism of renal origin: Secondary | ICD-10-CM | POA: Diagnosis not present

## 2017-04-12 DIAGNOSIS — N186 End stage renal disease: Secondary | ICD-10-CM | POA: Diagnosis not present

## 2017-04-12 DIAGNOSIS — Z992 Dependence on renal dialysis: Secondary | ICD-10-CM | POA: Diagnosis not present

## 2017-04-12 DIAGNOSIS — Z23 Encounter for immunization: Secondary | ICD-10-CM | POA: Diagnosis not present

## 2017-04-12 DIAGNOSIS — D472 Monoclonal gammopathy: Secondary | ICD-10-CM | POA: Diagnosis not present

## 2017-04-12 DIAGNOSIS — D631 Anemia in chronic kidney disease: Secondary | ICD-10-CM | POA: Diagnosis not present

## 2017-04-15 DIAGNOSIS — D631 Anemia in chronic kidney disease: Secondary | ICD-10-CM | POA: Diagnosis not present

## 2017-04-15 DIAGNOSIS — Z23 Encounter for immunization: Secondary | ICD-10-CM | POA: Diagnosis not present

## 2017-04-15 DIAGNOSIS — D472 Monoclonal gammopathy: Secondary | ICD-10-CM | POA: Diagnosis not present

## 2017-04-15 DIAGNOSIS — Z992 Dependence on renal dialysis: Secondary | ICD-10-CM | POA: Diagnosis not present

## 2017-04-15 DIAGNOSIS — N2581 Secondary hyperparathyroidism of renal origin: Secondary | ICD-10-CM | POA: Diagnosis not present

## 2017-04-15 DIAGNOSIS — N186 End stage renal disease: Secondary | ICD-10-CM | POA: Diagnosis not present

## 2017-04-16 ENCOUNTER — Ambulatory Visit (HOSPITAL_BASED_OUTPATIENT_CLINIC_OR_DEPARTMENT_OTHER): Payer: Medicare Other | Admitting: Internal Medicine

## 2017-04-16 ENCOUNTER — Encounter: Payer: Self-pay | Admitting: Internal Medicine

## 2017-04-16 ENCOUNTER — Telehealth: Payer: Self-pay | Admitting: Internal Medicine

## 2017-04-16 VITALS — BP 133/68 | HR 68 | Temp 97.6°F | Resp 18 | Ht 68.0 in | Wt 159.1 lb

## 2017-04-16 DIAGNOSIS — N186 End stage renal disease: Secondary | ICD-10-CM

## 2017-04-16 DIAGNOSIS — Z992 Dependence on renal dialysis: Secondary | ICD-10-CM | POA: Diagnosis not present

## 2017-04-16 DIAGNOSIS — N184 Chronic kidney disease, stage 4 (severe): Secondary | ICD-10-CM

## 2017-04-16 DIAGNOSIS — D890 Polyclonal hypergammaglobulinemia: Secondary | ICD-10-CM

## 2017-04-16 NOTE — Telephone Encounter (Signed)
Scheduled appt per 5/15 los. - [facility] to contact patient with CT biopsy.

## 2017-04-16 NOTE — Progress Notes (Signed)
[place] [phone]   [phone]  OFFICE PROGRESS NOTE  Prince Solian, MD [place] [address]  DIAGNOSIS:  1) Polyclonal gammopathy of undetermined significance. 2) End stage renal disease currently on hemodialysis on Monday, Wednesday and Friday  PRIOR THERAPY: None  CURRENT THERAPY: Observation  INTERVAL HISTORY: Lonnie [name] 83 y.o. male returns to the clinic today for follow-up visit. The patient is feeling fine today was no specific complaints. He is currently on hemodialysis on Monday, Wednesday and Friday every week. He is followed by Dr. Justin Mend. He denied having any weight loss or night sweats. He has no nausea, vomiting, diarrhea or constipation. He has no chest pain, shortness of breath, cough or hemoptysis. He had a recent myeloma panel and he is here for evaluation and discussion of his lab results.  MEDICAL HISTORY: Past Medical History:  Diagnosis Date  . Adenomatous polyps   . Bursitis    left hip  . Chronic kidney disease    Stage 4  . Frequent urination at night   . GERD (gastroesophageal reflux disease)   . Hypertension   . Hypothyroidism   . Pneumonia   . Polyclonal gammopathy determined by serum protein electrophoresis 10/04/2015    ALLERGIES:  is allergic to no known allergies.  MEDICATIONS:  Current Outpatient Prescriptions  Medication Sig Dispense Refill  . amLODipine (NORVASC) 5 MG tablet Take 5 mg by mouth at bedtime.     . clotrimazole (LOTRIMIN) 1 % cream Apply 1 application topically 2 (two) times daily. Use of 7-10 days. (Patient taking differently: Apply 1 application topically 2 (two) times daily as needed (for rash). ) 30 g 0  . escitalopram (LEXAPRO) 10 MG tablet Take 10 mg by mouth at bedtime.     [name] levothyroxine (SYNTHROID, LEVOTHROID) 88 MCG tablet Take 88 mcg by mouth at bedtime.    . mirabegron ER (MYRBETRIQ) 50 MG TB24 tablet Take 50 mg by mouth daily.    [name]  oxyCODONE-acetaminophen (PERCOCET/ROXICET) 5-325 MG tablet Take 1 tablet by mouth every 6 (six) hours as needed. 6 tablet 0  . oxyCODONE-acetaminophen (ROXICET) 5-325 MG tablet Take 1 tablet by mouth every 6 (six) hours as needed for moderate pain. (Patient not taking: Reported on 11/09/2016) 10 tablet 0  . polyvinyl alcohol (LIQUIFILM TEARS) 1.4 % ophthalmic solution Place 1-2 drops into both eyes as needed for dry eyes.    . ranitidine (ZANTAC) 150 MG capsule Take 150 mg by mouth 2 (two) times daily.     No current facility-administered medications for this visit.     SURGICAL HISTORY:  Past Surgical History:  Procedure Laterality Date  . AV FISTULA PLACEMENT Left 07/10/2016   Procedure: LEFT BRACHIOCEPHALIC ARTERIOVENOUS (AV) FISTULA CREATION;  Surgeon: Conrad [place], MD;  Location: [facility];  Service: Vascular;  Laterality: Left;  . COLONOSCOPY    . RENAL BIOPSY Left 10/06/2015  . REVISON OF ARTERIOVENOUS FISTULA Left 44/96/7591   Procedure: PLICATION OF LEFT BRACHIOCEPHALIC ARTERIOVENOUS FISTULA;  Surgeon: Conrad Whitehawk, MD;  Location: [facility];  Service: Vascular;  Laterality: Left;  . SKIN SURGERY     back    REVIEW OF SYSTEMS:  A comprehensive review of systems was negative except for: Constitutional: positive for fatigue   PHYSICAL EXAMINATION: General appearance: alert, cooperative, fatigued and no distress Head: Normocephalic, without obvious abnormality, atraumatic Neck: no adenopathy, no JVD, supple, symmetrical, trachea midline and thyroid not enlarged, symmetric, no tenderness/mass/nodules  Lymph nodes: Cervical, supraclavicular, and axillary nodes normal. Resp: clear to auscultation bilaterally Back: symmetric, no curvature. ROM normal. No CVA tenderness. Cardio: regular rate and rhythm, S1, S2 normal, no murmur, click, rub or gallop GI: soft, non-tender; bowel sounds normal; no masses,  no organomegaly Extremities: extremities normal, atraumatic, no cyanosis or edema  ECOG  PERFORMANCE STATUS: 1 - Symptomatic but completely ambulatory  Blood pressure 133/68, pulse 68, temperature 97.6 F (36.4 C), temperature source Oral, resp. rate 18, height '5\' 8"'  (1.727 m), weight 159 lb 1.6 oz (72.2 kg), SpO2 100 %.  LABORATORY DATA: Lab Results  Component Value Date   WBC 5.1 04/09/2017   HGB 11.8 (L) 04/09/2017   HCT 36.1 (L) 04/09/2017   MCV 95.1 04/09/2017   PLT 256 04/09/2017      Chemistry      Component Value Date/Time   NA 138 04/09/2017 1109   K 4.5 04/09/2017 1109   CL 108 03/19/2016 2045   CO2 27 04/09/2017 1109   BUN 26.9 (H) 04/09/2017 1109   CREATININE 4.0 (HH) 04/09/2017 1109      Component Value Date/Time   CALCIUM 9.2 04/09/2017 1109   ALKPHOS 123 04/09/2017 1109   AST 18 04/09/2017 1109   ALT 10 04/09/2017 1109   BILITOT 0.47 04/09/2017 1109     Myeloma panel: Beta-2 microglobulin 14.2, free kappa light chain 210.1, free lambda light chain 153.6 with a kappa/lambda ratio 1.37. IgG 1377, IgA 275 and IgM 74  RADIOGRAPHIC STUDIES: No results found.  ASSESSMENT AND PLAN:  This is a very pleasant 83 years old white male with end-stage renal disease currently on hemodialysis. The patient also has polyclonal gammopathy. He has been observation for the last few years. His recent myeloma panel showed significant increase in the free kappa light chain as well as the free lambda light chain. Again this could be polyclonal but I cannot rule out any underlying myeloproliferative disorder. I discussed the lab result with the patient today. I gave him the option of continuous observation and close monitoring versus repeating bone marrow biopsy and aspirate to rule out the development of any myeloproliferative disorder. The patient would like to proceed with the bone marrow biopsy and aspirate. I will arrange for this procedure to be performed in the next 2-3 weeks. I will see the patient 1 week after the biopsy for reevaluation and discussion of the  results and treatment options if needed. He was advised to call immediately if he has any concerning symptoms in the interval. The patient voices understanding of current disease status and treatment options and is in agreement with the current care plan. All questions were answered. The patient knows to call the clinic with any problems, questions or concerns. We can certainly see the patient much sooner if necessary. I spent 10 minutes counseling the patient face to face. The total time spent in the appointment was 15 minutes.  Disclaimer: This note was dictated with voice recognition software. Similar sounding words can inadvertently be transcribed and may not be corrected upon review.

## 2017-04-17 DIAGNOSIS — D472 Monoclonal gammopathy: Secondary | ICD-10-CM | POA: Diagnosis not present

## 2017-04-17 DIAGNOSIS — Z23 Encounter for immunization: Secondary | ICD-10-CM | POA: Diagnosis not present

## 2017-04-17 DIAGNOSIS — Z992 Dependence on renal dialysis: Secondary | ICD-10-CM | POA: Diagnosis not present

## 2017-04-17 DIAGNOSIS — N186 End stage renal disease: Secondary | ICD-10-CM | POA: Diagnosis not present

## 2017-04-17 DIAGNOSIS — D631 Anemia in chronic kidney disease: Secondary | ICD-10-CM | POA: Diagnosis not present

## 2017-04-17 DIAGNOSIS — N2581 Secondary hyperparathyroidism of renal origin: Secondary | ICD-10-CM | POA: Diagnosis not present

## 2017-04-19 DIAGNOSIS — Z992 Dependence on renal dialysis: Secondary | ICD-10-CM | POA: Diagnosis not present

## 2017-04-19 DIAGNOSIS — Z23 Encounter for immunization: Secondary | ICD-10-CM | POA: Diagnosis not present

## 2017-04-19 DIAGNOSIS — D472 Monoclonal gammopathy: Secondary | ICD-10-CM | POA: Diagnosis not present

## 2017-04-19 DIAGNOSIS — D631 Anemia in chronic kidney disease: Secondary | ICD-10-CM | POA: Diagnosis not present

## 2017-04-19 DIAGNOSIS — N2581 Secondary hyperparathyroidism of renal origin: Secondary | ICD-10-CM | POA: Diagnosis not present

## 2017-04-19 DIAGNOSIS — N186 End stage renal disease: Secondary | ICD-10-CM | POA: Diagnosis not present

## 2017-04-22 DIAGNOSIS — D631 Anemia in chronic kidney disease: Secondary | ICD-10-CM | POA: Diagnosis not present

## 2017-04-22 DIAGNOSIS — Z23 Encounter for immunization: Secondary | ICD-10-CM | POA: Diagnosis not present

## 2017-04-22 DIAGNOSIS — N186 End stage renal disease: Secondary | ICD-10-CM | POA: Diagnosis not present

## 2017-04-22 DIAGNOSIS — Z992 Dependence on renal dialysis: Secondary | ICD-10-CM | POA: Diagnosis not present

## 2017-04-22 DIAGNOSIS — D472 Monoclonal gammopathy: Secondary | ICD-10-CM | POA: Diagnosis not present

## 2017-04-22 DIAGNOSIS — N2581 Secondary hyperparathyroidism of renal origin: Secondary | ICD-10-CM | POA: Diagnosis not present

## 2017-04-23 ENCOUNTER — Ambulatory Visit (HOSPITAL_COMMUNITY): Payer: Medicare Other

## 2017-04-23 ENCOUNTER — Other Ambulatory Visit (HOSPITAL_COMMUNITY): Payer: Medicare Other

## 2017-04-24 DIAGNOSIS — D472 Monoclonal gammopathy: Secondary | ICD-10-CM | POA: Diagnosis not present

## 2017-04-24 DIAGNOSIS — N2581 Secondary hyperparathyroidism of renal origin: Secondary | ICD-10-CM | POA: Diagnosis not present

## 2017-04-24 DIAGNOSIS — D631 Anemia in chronic kidney disease: Secondary | ICD-10-CM | POA: Diagnosis not present

## 2017-04-24 DIAGNOSIS — Z23 Encounter for immunization: Secondary | ICD-10-CM | POA: Diagnosis not present

## 2017-04-24 DIAGNOSIS — N186 End stage renal disease: Secondary | ICD-10-CM | POA: Diagnosis not present

## 2017-04-24 DIAGNOSIS — Z992 Dependence on renal dialysis: Secondary | ICD-10-CM | POA: Diagnosis not present

## 2017-04-25 ENCOUNTER — Other Ambulatory Visit: Payer: Self-pay | Admitting: Radiology

## 2017-04-26 DIAGNOSIS — Z23 Encounter for immunization: Secondary | ICD-10-CM | POA: Diagnosis not present

## 2017-04-26 DIAGNOSIS — N2581 Secondary hyperparathyroidism of renal origin: Secondary | ICD-10-CM | POA: Diagnosis not present

## 2017-04-26 DIAGNOSIS — N186 End stage renal disease: Secondary | ICD-10-CM | POA: Diagnosis not present

## 2017-04-26 DIAGNOSIS — Z992 Dependence on renal dialysis: Secondary | ICD-10-CM | POA: Diagnosis not present

## 2017-04-26 DIAGNOSIS — D631 Anemia in chronic kidney disease: Secondary | ICD-10-CM | POA: Diagnosis not present

## 2017-04-26 DIAGNOSIS — D472 Monoclonal gammopathy: Secondary | ICD-10-CM | POA: Diagnosis not present

## 2017-04-29 DIAGNOSIS — Z23 Encounter for immunization: Secondary | ICD-10-CM | POA: Diagnosis not present

## 2017-04-29 DIAGNOSIS — N2581 Secondary hyperparathyroidism of renal origin: Secondary | ICD-10-CM | POA: Diagnosis not present

## 2017-04-29 DIAGNOSIS — D631 Anemia in chronic kidney disease: Secondary | ICD-10-CM | POA: Diagnosis not present

## 2017-04-29 DIAGNOSIS — Z992 Dependence on renal dialysis: Secondary | ICD-10-CM | POA: Diagnosis not present

## 2017-04-29 DIAGNOSIS — D472 Monoclonal gammopathy: Secondary | ICD-10-CM | POA: Diagnosis not present

## 2017-04-29 DIAGNOSIS — N186 End stage renal disease: Secondary | ICD-10-CM | POA: Diagnosis not present

## 2017-04-30 ENCOUNTER — Ambulatory Visit (HOSPITAL_COMMUNITY)
Admission: RE | Admit: 2017-04-30 | Discharge: 2017-04-30 | Disposition: A | Discharge status: Home / Self Care | Payer: Medicare Other | Source: Ambulatory Visit | Attending: Internal Medicine | Admitting: Internal Medicine

## 2017-04-30 ENCOUNTER — Encounter (HOSPITAL_COMMUNITY): Payer: Self-pay

## 2017-04-30 ENCOUNTER — Other Ambulatory Visit: Payer: Self-pay | Admitting: Internal Medicine

## 2017-04-30 DIAGNOSIS — N186 End stage renal disease: Secondary | ICD-10-CM | POA: Diagnosis not present

## 2017-04-30 DIAGNOSIS — D890 Polyclonal hypergammaglobulinemia: Secondary | ICD-10-CM | POA: Diagnosis not present

## 2017-04-30 DIAGNOSIS — E039 Hypothyroidism, unspecified: Secondary | ICD-10-CM | POA: Insufficient documentation

## 2017-04-30 DIAGNOSIS — D649 Anemia, unspecified: Secondary | ICD-10-CM | POA: Diagnosis not present

## 2017-04-30 DIAGNOSIS — I120 Hypertensive chronic kidney disease with stage 5 chronic kidney disease or end stage renal disease: Secondary | ICD-10-CM | POA: Diagnosis not present

## 2017-04-30 DIAGNOSIS — Z79899 Other long term (current) drug therapy: Secondary | ICD-10-CM | POA: Diagnosis not present

## 2017-04-30 DIAGNOSIS — K219 Gastro-esophageal reflux disease without esophagitis: Secondary | ICD-10-CM | POA: Diagnosis not present

## 2017-04-30 DIAGNOSIS — D7589 Other specified diseases of blood and blood-forming organs: Secondary | ICD-10-CM | POA: Diagnosis not present

## 2017-04-30 DIAGNOSIS — N184 Chronic kidney disease, stage 4 (severe): Secondary | ICD-10-CM

## 2017-04-30 DIAGNOSIS — D472 Monoclonal gammopathy: Secondary | ICD-10-CM | POA: Diagnosis not present

## 2017-04-30 LAB — CBC WITH DIFFERENTIAL/PLATELET
BASOS ABS: 0 10*3/uL (ref 0.0–0.1)
Basophils Relative: 1 %
EOS ABS: 0.7 10*3/uL (ref 0.0–0.7)
EOS PCT: 10 %
HCT: 39.2 % (ref 39.0–52.0)
HEMOGLOBIN: 12.8 g/dL — AB (ref 13.0–17.0)
LYMPHS ABS: 1.3 10*3/uL (ref 0.7–4.0)
LYMPHS PCT: 19 %
MCH: 30.9 pg (ref 26.0–34.0)
MCHC: 32.7 g/dL (ref 30.0–36.0)
MCV: 94.7 fL (ref 78.0–100.0)
Monocytes Absolute: 0.6 10*3/uL (ref 0.1–1.0)
Monocytes Relative: 8 %
NEUTROS PCT: 62 %
Neutro Abs: 4.4 10*3/uL (ref 1.7–7.7)
PLATELETS: 245 10*3/uL (ref 150–400)
RBC: 4.14 MIL/uL — AB (ref 4.22–5.81)
RDW: 14.6 % (ref 11.5–15.5)
WBC: 7.1 10*3/uL (ref 4.0–10.5)

## 2017-04-30 LAB — BASIC METABOLIC PANEL
Anion gap: 10 (ref 5–15)
BUN: 27 mg/dL — AB (ref 6–20)
CHLORIDE: 99 mmol/L — AB (ref 101–111)
CO2: 28 mmol/L (ref 22–32)
CREATININE: 4.04 mg/dL — AB (ref 0.61–1.24)
Calcium: 8.7 mg/dL — ABNORMAL LOW (ref 8.9–10.3)
GFR, EST AFRICAN AMERICAN: 14 mL/min — AB (ref >60)
GFR, EST NON AFRICAN AMERICAN: 12 mL/min — AB (ref >60)
Glucose, Bld: 94 mg/dL (ref 65–99)
POTASSIUM: 4 mmol/L (ref 3.5–5.1)
SODIUM: 137 mmol/L (ref 135–145)

## 2017-04-30 LAB — PROTIME-INR
INR: 1.04
PROTHROMBIN TIME: 13.6 s (ref 11.4–15.2)

## 2017-04-30 MED ORDER — FENTANYL CITRATE (PF) 100 MCG/2ML IJ SOLN
INTRAMUSCULAR | Status: AC
  Filled 2017-04-30: qty 6

## 2017-04-30 MED ORDER — LIDOCAINE HCL 1 % IJ SOLN
INTRAMUSCULAR | Status: AC | PRN
  Administered 2017-04-30: 10 mL

## 2017-04-30 MED ORDER — FLUMAZENIL 0.5 MG/5ML IV SOLN
INTRAVENOUS | Status: AC
  Filled 2017-04-30: qty 5

## 2017-04-30 MED ORDER — NALOXONE HCL 0.4 MG/ML IJ SOLN
INTRAMUSCULAR | Status: AC
  Filled 2017-04-30: qty 1

## 2017-04-30 MED ORDER — FENTANYL CITRATE (PF) 100 MCG/2ML IJ SOLN
INTRAMUSCULAR | Status: AC | PRN
  Administered 2017-04-30: 25 ug via INTRAVENOUS
  Administered 2017-04-30: 50 ug via INTRAVENOUS
  Administered 2017-04-30: 25 ug via INTRAVENOUS

## 2017-04-30 MED ORDER — MIDAZOLAM HCL 2 MG/2ML IJ SOLN
INTRAMUSCULAR | Status: AC
  Filled 2017-04-30: qty 6

## 2017-04-30 MED ORDER — MIDAZOLAM HCL 2 MG/2ML IJ SOLN
INTRAMUSCULAR | Status: AC | PRN
Start: 2017-04-30 — End: 2017-04-30
  Administered 2017-04-30 (×2): 1 mg via INTRAVENOUS

## 2017-04-30 MED ORDER — SODIUM CHLORIDE 0.9 % IV SOLN
INTRAVENOUS | Status: DC
  Administered 2017-04-30: 07:00:00 via INTRAVENOUS

## 2017-04-30 NOTE — Sedation Documentation (Signed)
Patient is resting comfortably. 

## 2017-04-30 NOTE — Discharge Instructions (Addendum)
Bone Marrow Aspiration and Bone Marrow Biopsy, Adult, Care After °This sheet gives you information about how to care for yourself after your procedure. Your health care provider may also give you more specific instructions. If you have problems or questions, contact your health care provider. °What can I expect after the procedure? °After the procedure, it is common to have: °· Mild pain and tenderness. °· Swelling. °· Bruising. °Follow these instructions at home: °· Take over-the-counter or prescription medicines only as told by your health care provider. °· Do not take baths, swim, or use a hot tub until your health care provider approves. Ask if you can take a shower or have a sponge bath. °· Follow instructions from your health care provider about how to take care of the puncture site. Make sure you: °¨ Wash your hands with soap and water before you change your bandage (dressing). If soap and water are not available, use hand sanitizer. °¨ Change your dressing as told by your health care provider. °· Check your puncture site every day for signs of infection. Check for: °¨ More redness, swelling, or pain. °¨ More fluid or blood. °¨ Warmth. °¨ Pus or a bad smell. °· Return to your normal activities as told by your health care provider. Ask your health care provider what activities are safe for you. °· Do not drive for 24 hours if you were given a medicine to help you relax (sedative). °· Keep all follow-up visits as told by your health care provider. This is important. °Contact a health care provider if: °· You have more redness, swelling, or pain around the puncture site. °· You have more fluid or blood coming from the puncture site. °· Your puncture site feels warm to the touch. °· You have pus or a bad smell coming from the puncture site. °· You have a fever. °· Your pain is not controlled with medicine. °This information is not intended to replace advice given to you by your health care provider. Make sure you  discuss any questions you have with your health care provider. °Document Released: 06/08/2005 Document Revised: 06/08/2016 Document Reviewed: 05/02/2016 °Elsevier Interactive Patient Education © 2017 [employer] °Moderate Conscious Sedation, Adult, Care After °These instructions provide you with information about caring for yourself after your procedure. Your health care provider may also give you more specific instructions. Your treatment has been planned according to current medical practices, but problems sometimes occur. Call your health care provider if you have any problems or questions after your procedure. °What can I expect after the procedure? °After your procedure, it is common: °· To feel sleepy for several hours. °· To feel clumsy and have poor balance for several hours. °· To have poor judgment for several hours. °· To vomit if you eat too soon. °Follow these instructions at home: °For at least 24 hours after the procedure:  ° °· Do not: °¨ Participate in activities where you could fall or become injured. °¨ Drive. °¨ Use heavy machinery. °¨ Drink alcohol. °¨ Take sleeping pills or medicines that cause drowsiness. °¨ Make important decisions or sign legal documents. °¨ Take care of children on your own. °· Rest. °Eating and drinking  °· Follow the diet recommended by your health care provider. °· If you vomit: °¨ Drink water, juice, or soup when you can drink without vomiting. °¨ Make sure you have little or no nausea before eating solid foods. °General instructions  °· Have a responsible adult stay with you until you   until you are awake and alert.  Take over-the-counter and prescription medicines only as told by your health care provider.  If you smoke, do not smoke without supervision.  Keep all follow-up visits as told by your health care provider. This is important. Contact a health care provider if:  You keep feeling nauseous or you keep vomiting.  You feel light-headed.  You develop a  rash.  You have a fever. Get help right away if:  You have trouble breathing. This information is not intended to replace advice given to you by your health care provider. Make sure you discuss any questions you have with your health care provider. Document Released: 09/09/2013 Document Revised: 04/23/2016 Document Reviewed: 03/10/2016 Elsevier Interactive Patient Education  2017 [employer].

## 2017-04-30 NOTE — Procedures (Signed)
Monoclonal gammopathy   status post CT-guided right iliac bone marrow aspirate and core biopsy  No immediate complication  Pathology pending  EBL less than 5 mL  Full report in PACs

## 2017-04-30 NOTE — H&P (Signed)
Referring Physician(s): Mohamed,Mohamed  Supervising Physician: Daryll Brod  Patient Status:  [facility]  Chief Complaint:  "I'm here for a bone biopsy"  Subjective: Patient familiar to IR service from prior bone marrow biopsy as well as random renal biopsy in 2016. He has a known history of end-stage renal disease as well as polyclonal gammopathy of undetermined significance. Recent myeloma panel has revealed elevated free kappa/lambda light chains and he presents again today for CT-guided bone marrow biopsy for further evaluation. He denies fever, HA,CP,dyspnea, cough, abd/back pain,N/V or bleeding.  Past Medical History:  Diagnosis Date  . Adenomatous polyps   . Bursitis    left hip  . Chronic kidney disease    Stage 4  . Frequent urination at night   . GERD (gastroesophageal reflux disease)   . Hypertension   . Hypothyroidism   . Pneumonia   . Polyclonal gammopathy determined by serum protein electrophoresis 10/04/2015   Past Surgical History:  Procedure Laterality Date  . AV FISTULA PLACEMENT Left 07/10/2016   Procedure: LEFT BRACHIOCEPHALIC ARTERIOVENOUS (AV) FISTULA CREATION;  Surgeon: Conrad Millheim, MD;  Location: [facility];  Service: Vascular;  Laterality: Left;  . COLONOSCOPY    . RENAL BIOPSY Left 10/06/2015  . REVISON OF ARTERIOVENOUS FISTULA Left 44/31/5400   Procedure: PLICATION OF LEFT BRACHIOCEPHALIC ARTERIOVENOUS FISTULA;  Surgeon: Conrad Lingle, MD;  Location: [facility];  Service: Vascular;  Laterality: Left;  . SKIN SURGERY     back      Allergies: No known allergies  Medications: Prior to Admission medications   Medication Sig Start Date End Date Taking? Authorizing Provider  amLODipine (NORVASC) 5 MG tablet Take 5 mg by mouth at bedtime.  03/23/14  Yes [provider]  clotrimazole (LOTRIMIN) 1 % cream Apply 1 application topically 2 (two) times daily. Use of 7-10 days. Patient taking differently: Apply 1 application topically 2 (two) times daily as  needed (for rash).  05/30/14  Yes Angelica Ran, MD  escitalopram (LEXAPRO) 10 MG tablet Take 10 mg by mouth at bedtime.  06/14/16  Yes [provider]  levothyroxine (SYNTHROID, LEVOTHROID) 88 MCG tablet Take 88 mcg by mouth at bedtime.   Yes [provider]  mirabegron ER (MYRBETRIQ) 50 MG TB24 tablet Take 50 mg by mouth daily.   Yes [provider]  oxyCODONE-acetaminophen (PERCOCET/ROXICET) 5-325 MG tablet Take 1 tablet by mouth every 6 (six) hours as needed. 11/13/16  Yes Laurence Slate M, PA-C  polyvinyl alcohol (LIQUIFILM TEARS) 1.4 % ophthalmic solution Place 1-2 drops into both eyes as needed for dry eyes.   Yes [provider]  ranitidine (ZANTAC) 150 MG capsule Take 150 mg by mouth 2 (two) times daily.   Yes [provider]  oxyCODONE-acetaminophen (ROXICET) 5-325 MG tablet Take 1 tablet by mouth every 6 (six) hours as needed for moderate pain. Patient not taking: Reported on 11/09/2016 07/10/16   Conrad , MD     Vital Signs: BP (!) 134/54   Pulse 69   Temp 97.4 F (36.3 C) (Oral)   Resp 16   Ht '5\' 9"'  (1.753 m)   Wt 157 lb 12.8 oz (71.6 kg)   SpO2 98%   BMI 23.30 kg/m   Physical Exam awake/alert; chest- distant BS bilat; heart- nl rate, occ ectopy; abd- soft,+BS,NT; ext- no edema; LUE AVF with good thrill/bruit  Imaging: No results found.  Labs:  CBC:  Recent Labs  07/10/16 1219  11/13/16 0909  12/28/16 0850 01/11/17 0810 04/09/17 1109 04/30/17 0718  WBC  --   --   --   --   --   --  5.1 7.1  HGB 11.6*  < > 11.2*  < > 10.0* 10.4* 11.8* 12.8*  HCT 34.0*  --  33.0*  --   --   --  36.1* 39.2  PLT  --   --   --   --   --   --  256 245  < > = values in this interval not displayed.  COAGS:  Recent Labs  04/30/17 0718  INR 1.04    BMP:  Recent Labs  07/10/16 1219 11/13/16 0909 04/09/17 1109 04/30/17 0718  NA 139 141 138 137  K 4.9 4.5 4.5 4.0  CL  --   --   --  99*  CO2  --   --  27 28  GLUCOSE  95 89 99 94  BUN  --   --  26.9* 27*  CALCIUM  --   --  9.2 8.7*  CREATININE  --   --  4.0* 4.04*  GFRNONAA  --   --   --  12*  GFRAA  --   --   --  14*    LIVER FUNCTION TESTS:  Recent Labs  04/09/17 1109  BILITOT 0.47  AST 18  ALT 10  ALKPHOS 123  PROT 7.7  ALBUMIN 3.7    Assessment and Plan:  Pt with history of end-stage renal disease as well as polyclonal gammopathy of undetermined significance. Recent myeloma panel has revealed elevated free kappa/lambda light chains and he presents  today for CT-guided bone marrow biopsy for further evaluation. Risks and benefits discussed with the patient including, but not limited to bleeding, infection, damage to adjacent structures or low yield requiring additional tests. All of the patient's questions were answered, patient is agreeable to proceed. Consent signed and in chart.    Electronically Signed: D. Rowe Robert, PA-C 04/30/2017, 8:30 AM   I spent a total of 20 minutes at the the patient's bedside AND on the patient's hospital floor or unit, greater than 50% of which was counseling/coordinating care for CT guided bone marrow biopsy

## 2017-05-01 DIAGNOSIS — Z992 Dependence on renal dialysis: Secondary | ICD-10-CM | POA: Diagnosis not present

## 2017-05-01 DIAGNOSIS — D472 Monoclonal gammopathy: Secondary | ICD-10-CM | POA: Diagnosis not present

## 2017-05-01 DIAGNOSIS — D631 Anemia in chronic kidney disease: Secondary | ICD-10-CM | POA: Diagnosis not present

## 2017-05-01 DIAGNOSIS — N186 End stage renal disease: Secondary | ICD-10-CM | POA: Diagnosis not present

## 2017-05-01 DIAGNOSIS — Z23 Encounter for immunization: Secondary | ICD-10-CM | POA: Diagnosis not present

## 2017-05-01 DIAGNOSIS — N2581 Secondary hyperparathyroidism of renal origin: Secondary | ICD-10-CM | POA: Diagnosis not present

## 2017-05-02 DIAGNOSIS — I129 Hypertensive chronic kidney disease with stage 1 through stage 4 chronic kidney disease, or unspecified chronic kidney disease: Secondary | ICD-10-CM | POA: Diagnosis not present

## 2017-05-02 DIAGNOSIS — Z992 Dependence on renal dialysis: Secondary | ICD-10-CM | POA: Diagnosis not present

## 2017-05-02 DIAGNOSIS — N186 End stage renal disease: Secondary | ICD-10-CM | POA: Diagnosis not present

## 2017-05-03 DIAGNOSIS — N2581 Secondary hyperparathyroidism of renal origin: Secondary | ICD-10-CM | POA: Diagnosis not present

## 2017-05-03 DIAGNOSIS — D631 Anemia in chronic kidney disease: Secondary | ICD-10-CM | POA: Diagnosis not present

## 2017-05-03 DIAGNOSIS — D472 Monoclonal gammopathy: Secondary | ICD-10-CM | POA: Diagnosis not present

## 2017-05-03 DIAGNOSIS — N185 Chronic kidney disease, stage 5: Secondary | ICD-10-CM | POA: Diagnosis not present

## 2017-05-03 DIAGNOSIS — N186 End stage renal disease: Secondary | ICD-10-CM | POA: Diagnosis not present

## 2017-05-06 DIAGNOSIS — N185 Chronic kidney disease, stage 5: Secondary | ICD-10-CM | POA: Diagnosis not present

## 2017-05-06 DIAGNOSIS — D472 Monoclonal gammopathy: Secondary | ICD-10-CM | POA: Diagnosis not present

## 2017-05-06 DIAGNOSIS — N2581 Secondary hyperparathyroidism of renal origin: Secondary | ICD-10-CM | POA: Diagnosis not present

## 2017-05-06 DIAGNOSIS — D631 Anemia in chronic kidney disease: Secondary | ICD-10-CM | POA: Diagnosis not present

## 2017-05-06 DIAGNOSIS — N186 End stage renal disease: Secondary | ICD-10-CM | POA: Diagnosis not present

## 2017-05-08 DIAGNOSIS — D472 Monoclonal gammopathy: Secondary | ICD-10-CM | POA: Diagnosis not present

## 2017-05-08 DIAGNOSIS — N2581 Secondary hyperparathyroidism of renal origin: Secondary | ICD-10-CM | POA: Diagnosis not present

## 2017-05-08 DIAGNOSIS — N185 Chronic kidney disease, stage 5: Secondary | ICD-10-CM | POA: Diagnosis not present

## 2017-05-08 DIAGNOSIS — N186 End stage renal disease: Secondary | ICD-10-CM | POA: Diagnosis not present

## 2017-05-08 DIAGNOSIS — D631 Anemia in chronic kidney disease: Secondary | ICD-10-CM | POA: Diagnosis not present

## 2017-05-09 ENCOUNTER — Telehealth: Payer: Self-pay | Admitting: Internal Medicine

## 2017-05-09 ENCOUNTER — Encounter: Payer: Self-pay | Admitting: Internal Medicine

## 2017-05-09 ENCOUNTER — Ambulatory Visit (HOSPITAL_BASED_OUTPATIENT_CLINIC_OR_DEPARTMENT_OTHER): Payer: Medicare Other | Admitting: Internal Medicine

## 2017-05-09 VITALS — BP 147/65 | HR 69 | Temp 97.8°F | Resp 19 | Ht 69.0 in | Wt 158.9 lb

## 2017-05-09 DIAGNOSIS — D890 Polyclonal hypergammaglobulinemia: Secondary | ICD-10-CM | POA: Diagnosis not present

## 2017-05-09 DIAGNOSIS — N186 End stage renal disease: Secondary | ICD-10-CM

## 2017-05-09 DIAGNOSIS — Z992 Dependence on renal dialysis: Secondary | ICD-10-CM | POA: Diagnosis not present

## 2017-05-09 NOTE — Telephone Encounter (Signed)
Per 6/7 LOS - no LOS per MM

## 2017-05-09 NOTE — Progress Notes (Signed)
[place] [phone]   [phone]  OFFICE PROGRESS NOTE  Prince Solian, MD [place] [address]  DIAGNOSIS:  1) Polyclonal gammopathy of undetermined significance. 2) End stage renal disease currently on hemodialysis on Monday, Wednesday and Friday  PRIOR THERAPY: None  CURRENT THERAPY: Observation  INTERVAL HISTORY: Lonnie [name] 83 y.o. male returns to the clinic today for follow-up visit. The patient is feeling fine with no specific complaints. He is currently on dialysis Monday, Wednesday and Friday every week. He is feeling fine today with no specific complaints. He denied having any weight loss or night sweats. He has no nausea, vomiting, diarrhea or constipation. He had repeat bone marrow biopsy and aspirate performed recently and he is here for evaluation and discussion of his biopsy results and treatment options.  MEDICAL HISTORY: Past Medical History:  Diagnosis Date  . Adenomatous polyps   . Bursitis    left hip  . Chronic kidney disease    Stage 4  . Frequent urination at night   . GERD (gastroesophageal reflux disease)   . Hypertension   . Hypothyroidism   . Pneumonia   . Polyclonal gammopathy determined by serum protein electrophoresis 10/04/2015    ALLERGIES:  is allergic to no known allergies.  MEDICATIONS:  Current Outpatient Prescriptions  Medication Sig Dispense Refill  . amLODipine (NORVASC) 5 MG tablet Take 5 mg by mouth at bedtime.     . B Complex-C-Folic Acid (DIALYVITE 099) 0.8 MG TABS Take 1 tablet by mouth daily.    . calcium acetate (PHOSLO) 667 MG capsule Take 1 capsule by mouth 3 (three) times daily.  0  . clotrimazole (LOTRIMIN) 1 % cream Apply 1 application topically 2 (two) times daily. Use of 7-10 days. (Patient taking differently: Apply 1 application topically 2 (two) times daily as needed (for rash). ) 30 g 0  . escitalopram (LEXAPRO) 10 MG tablet Take 10 mg by mouth at  bedtime.     [name] levothyroxine (SYNTHROID, LEVOTHROID) 88 MCG tablet Take 88 mcg by mouth at bedtime.    . lidocaine-prilocaine (EMLA) cream Apply 1 application topically daily as needed.  0  . mirabegron ER (MYRBETRIQ) 50 MG TB24 tablet Take 50 mg by mouth daily.    . polyvinyl alcohol (LIQUIFILM TEARS) 1.4 % ophthalmic solution Place 1-2 drops into both eyes as needed for dry eyes.    . ranitidine (ZANTAC) 150 MG capsule Take 150 mg by mouth 2 (two) times daily.     No current facility-administered medications for this visit.     SURGICAL HISTORY:  Past Surgical History:  Procedure Laterality Date  . AV FISTULA PLACEMENT Left 07/10/2016   Procedure: LEFT BRACHIOCEPHALIC ARTERIOVENOUS (AV) FISTULA CREATION;  Surgeon: Conrad IXL, MD;  Location: [facility];  Service: Vascular;  Laterality: Left;  . COLONOSCOPY    . RENAL BIOPSY Left 10/06/2015  . REVISON OF ARTERIOVENOUS FISTULA Left 83/38/2505   Procedure: PLICATION OF LEFT BRACHIOCEPHALIC ARTERIOVENOUS FISTULA;  Surgeon: Conrad [place], MD;  Location: [facility];  Service: Vascular;  Laterality: Left;  . SKIN SURGERY     back    REVIEW OF SYSTEMS:  A comprehensive review of systems was negative except for: Constitutional: positive for fatigue   PHYSICAL EXAMINATION: General appearance: alert, cooperative, fatigued and no distress Head: Normocephalic, without obvious abnormality, atraumatic Neck: no adenopathy, no JVD, supple, symmetrical, trachea midline and thyroid not enlarged, symmetric, no tenderness/mass/nodules Lymph nodes: Cervical,  supraclavicular, and axillary nodes normal. Resp: clear to auscultation bilaterally Back: symmetric, no curvature. ROM normal. No CVA tenderness. Cardio: regular rate and rhythm, S1, S2 normal, no murmur, click, rub or gallop GI: soft, non-tender; bowel sounds normal; no masses,  no organomegaly Extremities: extremities normal, atraumatic, no cyanosis or edema  ECOG PERFORMANCE STATUS: 1 - Symptomatic but  completely ambulatory  Blood pressure (!) 147/65, pulse 69, temperature 97.8 F (36.6 C), temperature source Oral, resp. rate 19, height '5\' 9"'$  (1.753 m), weight 158 lb 14.4 oz (72.1 kg), SpO2 100 %.  LABORATORY DATA: Lab Results  Component Value Date   WBC 7.1 10-May-2017   HGB 12.8 (L) 05-10-17   HCT 39.2 2017/05/10   MCV 94.7 May 10, 2017   PLT 245 2017/05/10      Chemistry      Component Value Date/Time   NA 137 05/10/17 0718   NA 138 04/09/2017 1109   K 4.0 May 10, 2017 0718   K 4.5 04/09/2017 1109   CL 99 (L) 05/10/2017 0718   CO2 28 05/10/17 0718   CO2 27 04/09/2017 1109   BUN 27 (H) 05-10-17 0718   BUN 26.9 (H) 04/09/2017 1109   CREATININE 4.04 (H) 05/10/2017 0718   CREATININE 4.0 (HH) 04/09/2017 1109      Component Value Date/Time   CALCIUM 8.7 (L) 05-10-2017 0718   CALCIUM 9.2 04/09/2017 1109   ALKPHOS 123 04/09/2017 1109   AST 18 04/09/2017 1109   ALT 10 04/09/2017 1109   BILITOT 0.47 04/09/2017 1109     Myeloma panel: Beta-2 microglobulin 14.2, free kappa light chain 210.1, free lambda light chain 153.6 with a kappa/lambda ratio 1.37. IgG 1377, IgA 275 and IgM 74  RADIOGRAPHIC STUDIES: Ct Biopsy  Result Date: 2017/05/10 INDICATION: Monoclonal gammopathy, concern for myeloma EXAM: CT GUIDED RIGHT ILIAC BONE MARROW ASPIRATION AND CORE BIOPSY Date:  06-08-201808-Jun-2018 10:02 am Radiologist:  M. Daryll Brod, MD Guidance:  CT FLUOROSCOPY TIME:  Fluoroscopy Time: NONE. MEDICATIONS: None. ANESTHESIA/SEDATION: 2.0 mg IV Versed; 100 mcg IV Fentanyl Moderate Sedation Time:  10 minutes The patient was continuously monitored during the procedure by the interventional radiology nurse under my direct supervision. CONTRAST:  None. COMPLICATIONS: None PROCEDURE: Informed consent was obtained from the patient following explanation of the procedure, risks, benefits and alternatives. The patient understands, agrees and consents for the procedure. All questions were addressed. A  time out was performed. The patient was positioned prone and non-contrast localization CT was performed of the pelvis to demonstrate the iliac marrow spaces. Maximal barrier sterile technique utilized including caps, mask, sterile gowns, sterile gloves, large sterile drape, hand hygiene, and Betadine prep. Under sterile conditions and local anesthesia, an 11 gauge coaxial bone biopsy needle was advanced into the right iliac marrow space. Needle position was confirmed with CT imaging. Initially, bone marrow aspiration was performed. Next, the 11 gauge outer cannula was utilized to obtain a right iliac bone marrow core biopsy. Needle was removed. Hemostasis was obtained with compression. The patient tolerated the procedure well. Samples were prepared with the cytotechnologist. No immediate complications. IMPRESSION: CT guided right iliac bone marrow aspiration and core biopsy. Electronically Signed   By: Jerilynn Mages.  Shick M.D.   On: 05-10-17 10:05   Ct Bone Marrow Biopsy & Aspiration  Result Date: 05-10-2017 INDICATION: Monoclonal gammopathy, concern for myeloma EXAM: CT GUIDED RIGHT ILIAC BONE MARROW ASPIRATION AND CORE BIOPSY Date:  June 08, 201806-07-2017 10:02 am Radiologist:  M. Daryll Brod, MD Guidance:  CT FLUOROSCOPY TIME:  Fluoroscopy Time:  NONE. MEDICATIONS: None. ANESTHESIA/SEDATION: 2.0 mg IV Versed; 100 mcg IV Fentanyl Moderate Sedation Time:  10 minutes The patient was continuously monitored during the procedure by the interventional radiology nurse under my direct supervision. CONTRAST:  None. COMPLICATIONS: None PROCEDURE: Informed consent was obtained from the patient following explanation of the procedure, risks, benefits and alternatives. The patient understands, agrees and consents for the procedure. All questions were addressed. A time out was performed. The patient was positioned prone and non-contrast localization CT was performed of the pelvis to demonstrate the iliac marrow spaces. Maximal barrier  sterile technique utilized including caps, mask, sterile gowns, sterile gloves, large sterile drape, hand hygiene, and Betadine prep. Under sterile conditions and local anesthesia, an 11 gauge coaxial bone biopsy needle was advanced into the right iliac marrow space. Needle position was confirmed with CT imaging. Initially, bone marrow aspiration was performed. Next, the 11 gauge outer cannula was utilized to obtain a right iliac bone marrow core biopsy. Needle was removed. Hemostasis was obtained with compression. The patient tolerated the procedure well. Samples were prepared with the cytotechnologist. No immediate complications. IMPRESSION: CT guided right iliac bone marrow aspiration and core biopsy. Electronically Signed   By: Jerilynn Mages.  Shick M.D.   On: 04/30/2017 10:05    ASSESSMENT AND PLAN:  This is a very pleasant 83 years old white male with end stage renal disease currently on dialysis as well as history of polyclonal gammopathy of undetermined significance. His serum light chain were elevated on the last blood work. I ordered CT bone marrow biopsy and aspirate which was performed recently and it showed only 2% plasma cells with lack of large aggregates or sheets and immunohistochemical stains showed a polyclonal staining pattern for And lambda light chains. This pattern was nonspecific and was not diagnostic of plasma cell dyscrasia/neoplasm. I think his polyclonal protein is probably secondary to his renal failure rather than the cause of it. I discussed the biopsy with the patient today. I recommended for him to continue on observation and management of his renal failure by Dr. Justin Mend. I don't see a need to see the patient at regular basis at this point but will be happy to see him in the future if needed. He was advised to call if he has any concerning symptoms. The patient voices understanding of current disease status and treatment options and is in agreement with the current care plan. All  questions were answered. The patient knows to call the clinic with any problems, questions or concerns. We can certainly see the patient much sooner if necessary. I spent 10 minutes counseling the patient face to face. The total time spent in the appointment was 15 minutes.  Disclaimer: This note was dictated with voice recognition software. Similar sounding words can inadvertently be transcribed and may not be corrected upon review.

## 2017-05-10 DIAGNOSIS — N186 End stage renal disease: Secondary | ICD-10-CM | POA: Diagnosis not present

## 2017-05-10 DIAGNOSIS — D472 Monoclonal gammopathy: Secondary | ICD-10-CM | POA: Diagnosis not present

## 2017-05-10 DIAGNOSIS — N2581 Secondary hyperparathyroidism of renal origin: Secondary | ICD-10-CM | POA: Diagnosis not present

## 2017-05-10 DIAGNOSIS — N185 Chronic kidney disease, stage 5: Secondary | ICD-10-CM | POA: Diagnosis not present

## 2017-05-10 DIAGNOSIS — D631 Anemia in chronic kidney disease: Secondary | ICD-10-CM | POA: Diagnosis not present

## 2017-05-13 DIAGNOSIS — N186 End stage renal disease: Secondary | ICD-10-CM | POA: Diagnosis not present

## 2017-05-13 DIAGNOSIS — N2581 Secondary hyperparathyroidism of renal origin: Secondary | ICD-10-CM | POA: Diagnosis not present

## 2017-05-13 DIAGNOSIS — D472 Monoclonal gammopathy: Secondary | ICD-10-CM | POA: Diagnosis not present

## 2017-05-13 DIAGNOSIS — D631 Anemia in chronic kidney disease: Secondary | ICD-10-CM | POA: Diagnosis not present

## 2017-05-13 DIAGNOSIS — N185 Chronic kidney disease, stage 5: Secondary | ICD-10-CM | POA: Diagnosis not present

## 2017-05-15 DIAGNOSIS — D472 Monoclonal gammopathy: Secondary | ICD-10-CM | POA: Diagnosis not present

## 2017-05-15 DIAGNOSIS — N186 End stage renal disease: Secondary | ICD-10-CM | POA: Diagnosis not present

## 2017-05-15 DIAGNOSIS — D631 Anemia in chronic kidney disease: Secondary | ICD-10-CM | POA: Diagnosis not present

## 2017-05-15 DIAGNOSIS — N185 Chronic kidney disease, stage 5: Secondary | ICD-10-CM | POA: Diagnosis not present

## 2017-05-15 DIAGNOSIS — N2581 Secondary hyperparathyroidism of renal origin: Secondary | ICD-10-CM | POA: Diagnosis not present

## 2017-05-16 ENCOUNTER — Encounter (HOSPITAL_COMMUNITY): Payer: Self-pay

## 2017-05-17 DIAGNOSIS — N186 End stage renal disease: Secondary | ICD-10-CM | POA: Diagnosis not present

## 2017-05-17 DIAGNOSIS — D472 Monoclonal gammopathy: Secondary | ICD-10-CM | POA: Diagnosis not present

## 2017-05-17 DIAGNOSIS — D631 Anemia in chronic kidney disease: Secondary | ICD-10-CM | POA: Diagnosis not present

## 2017-05-17 DIAGNOSIS — N2581 Secondary hyperparathyroidism of renal origin: Secondary | ICD-10-CM | POA: Diagnosis not present

## 2017-05-17 DIAGNOSIS — N185 Chronic kidney disease, stage 5: Secondary | ICD-10-CM | POA: Diagnosis not present

## 2017-05-20 DIAGNOSIS — D631 Anemia in chronic kidney disease: Secondary | ICD-10-CM | POA: Diagnosis not present

## 2017-05-20 DIAGNOSIS — D472 Monoclonal gammopathy: Secondary | ICD-10-CM | POA: Diagnosis not present

## 2017-05-20 DIAGNOSIS — N185 Chronic kidney disease, stage 5: Secondary | ICD-10-CM | POA: Diagnosis not present

## 2017-05-20 DIAGNOSIS — N2581 Secondary hyperparathyroidism of renal origin: Secondary | ICD-10-CM | POA: Diagnosis not present

## 2017-05-20 DIAGNOSIS — N186 End stage renal disease: Secondary | ICD-10-CM | POA: Diagnosis not present

## 2017-05-20 LAB — CHROMOSOME ANALYSIS, BONE MARROW

## 2017-05-22 DIAGNOSIS — N186 End stage renal disease: Secondary | ICD-10-CM | POA: Diagnosis not present

## 2017-05-22 DIAGNOSIS — D631 Anemia in chronic kidney disease: Secondary | ICD-10-CM | POA: Diagnosis not present

## 2017-05-22 DIAGNOSIS — D472 Monoclonal gammopathy: Secondary | ICD-10-CM | POA: Diagnosis not present

## 2017-05-22 DIAGNOSIS — N185 Chronic kidney disease, stage 5: Secondary | ICD-10-CM | POA: Diagnosis not present

## 2017-05-22 DIAGNOSIS — N2581 Secondary hyperparathyroidism of renal origin: Secondary | ICD-10-CM | POA: Diagnosis not present

## 2017-05-24 DIAGNOSIS — D631 Anemia in chronic kidney disease: Secondary | ICD-10-CM | POA: Diagnosis not present

## 2017-05-24 DIAGNOSIS — N2581 Secondary hyperparathyroidism of renal origin: Secondary | ICD-10-CM | POA: Diagnosis not present

## 2017-05-24 DIAGNOSIS — N186 End stage renal disease: Secondary | ICD-10-CM | POA: Diagnosis not present

## 2017-05-24 DIAGNOSIS — D472 Monoclonal gammopathy: Secondary | ICD-10-CM | POA: Diagnosis not present

## 2017-05-24 DIAGNOSIS — N185 Chronic kidney disease, stage 5: Secondary | ICD-10-CM | POA: Diagnosis not present

## 2017-05-27 DIAGNOSIS — N185 Chronic kidney disease, stage 5: Secondary | ICD-10-CM | POA: Diagnosis not present

## 2017-05-27 DIAGNOSIS — D472 Monoclonal gammopathy: Secondary | ICD-10-CM | POA: Diagnosis not present

## 2017-05-27 DIAGNOSIS — D631 Anemia in chronic kidney disease: Secondary | ICD-10-CM | POA: Diagnosis not present

## 2017-05-27 DIAGNOSIS — N186 End stage renal disease: Secondary | ICD-10-CM | POA: Diagnosis not present

## 2017-05-27 DIAGNOSIS — N2581 Secondary hyperparathyroidism of renal origin: Secondary | ICD-10-CM | POA: Diagnosis not present

## 2017-05-29 DIAGNOSIS — N185 Chronic kidney disease, stage 5: Secondary | ICD-10-CM | POA: Diagnosis not present

## 2017-05-29 DIAGNOSIS — D472 Monoclonal gammopathy: Secondary | ICD-10-CM | POA: Diagnosis not present

## 2017-05-29 DIAGNOSIS — D631 Anemia in chronic kidney disease: Secondary | ICD-10-CM | POA: Diagnosis not present

## 2017-05-29 DIAGNOSIS — N2581 Secondary hyperparathyroidism of renal origin: Secondary | ICD-10-CM | POA: Diagnosis not present

## 2017-05-29 DIAGNOSIS — N186 End stage renal disease: Secondary | ICD-10-CM | POA: Diagnosis not present

## 2017-05-31 DIAGNOSIS — D472 Monoclonal gammopathy: Secondary | ICD-10-CM | POA: Diagnosis not present

## 2017-05-31 DIAGNOSIS — N186 End stage renal disease: Secondary | ICD-10-CM | POA: Diagnosis not present

## 2017-05-31 DIAGNOSIS — N185 Chronic kidney disease, stage 5: Secondary | ICD-10-CM | POA: Diagnosis not present

## 2017-05-31 DIAGNOSIS — N2581 Secondary hyperparathyroidism of renal origin: Secondary | ICD-10-CM | POA: Diagnosis not present

## 2017-05-31 DIAGNOSIS — D631 Anemia in chronic kidney disease: Secondary | ICD-10-CM | POA: Diagnosis not present

## 2017-06-01 DIAGNOSIS — N186 End stage renal disease: Secondary | ICD-10-CM | POA: Diagnosis not present

## 2017-06-01 DIAGNOSIS — I129 Hypertensive chronic kidney disease with stage 1 through stage 4 chronic kidney disease, or unspecified chronic kidney disease: Secondary | ICD-10-CM | POA: Diagnosis not present

## 2017-06-01 DIAGNOSIS — Z992 Dependence on renal dialysis: Secondary | ICD-10-CM | POA: Diagnosis not present

## 2017-06-03 DIAGNOSIS — N186 End stage renal disease: Secondary | ICD-10-CM | POA: Diagnosis not present

## 2017-06-03 DIAGNOSIS — D472 Monoclonal gammopathy: Secondary | ICD-10-CM | POA: Diagnosis not present

## 2017-06-03 DIAGNOSIS — Z992 Dependence on renal dialysis: Secondary | ICD-10-CM | POA: Diagnosis not present

## 2017-06-03 DIAGNOSIS — N2581 Secondary hyperparathyroidism of renal origin: Secondary | ICD-10-CM | POA: Diagnosis not present

## 2017-06-03 DIAGNOSIS — D631 Anemia in chronic kidney disease: Secondary | ICD-10-CM | POA: Diagnosis not present

## 2017-06-05 DIAGNOSIS — Z992 Dependence on renal dialysis: Secondary | ICD-10-CM | POA: Diagnosis not present

## 2017-06-05 DIAGNOSIS — N2581 Secondary hyperparathyroidism of renal origin: Secondary | ICD-10-CM | POA: Diagnosis not present

## 2017-06-05 DIAGNOSIS — D631 Anemia in chronic kidney disease: Secondary | ICD-10-CM | POA: Diagnosis not present

## 2017-06-05 DIAGNOSIS — N186 End stage renal disease: Secondary | ICD-10-CM | POA: Diagnosis not present

## 2017-06-05 DIAGNOSIS — D472 Monoclonal gammopathy: Secondary | ICD-10-CM | POA: Diagnosis not present

## 2017-06-07 DIAGNOSIS — N186 End stage renal disease: Secondary | ICD-10-CM | POA: Diagnosis not present

## 2017-06-07 DIAGNOSIS — D631 Anemia in chronic kidney disease: Secondary | ICD-10-CM | POA: Diagnosis not present

## 2017-06-07 DIAGNOSIS — Z992 Dependence on renal dialysis: Secondary | ICD-10-CM | POA: Diagnosis not present

## 2017-06-07 DIAGNOSIS — D472 Monoclonal gammopathy: Secondary | ICD-10-CM | POA: Diagnosis not present

## 2017-06-07 DIAGNOSIS — N2581 Secondary hyperparathyroidism of renal origin: Secondary | ICD-10-CM | POA: Diagnosis not present

## 2017-06-10 DIAGNOSIS — Z992 Dependence on renal dialysis: Secondary | ICD-10-CM | POA: Diagnosis not present

## 2017-06-10 DIAGNOSIS — N186 End stage renal disease: Secondary | ICD-10-CM | POA: Diagnosis not present

## 2017-06-10 DIAGNOSIS — N2581 Secondary hyperparathyroidism of renal origin: Secondary | ICD-10-CM | POA: Diagnosis not present

## 2017-06-10 DIAGNOSIS — D472 Monoclonal gammopathy: Secondary | ICD-10-CM | POA: Diagnosis not present

## 2017-06-10 DIAGNOSIS — D631 Anemia in chronic kidney disease: Secondary | ICD-10-CM | POA: Diagnosis not present

## 2017-06-12 DIAGNOSIS — Z992 Dependence on renal dialysis: Secondary | ICD-10-CM | POA: Diagnosis not present

## 2017-06-12 DIAGNOSIS — D472 Monoclonal gammopathy: Secondary | ICD-10-CM | POA: Diagnosis not present

## 2017-06-12 DIAGNOSIS — D631 Anemia in chronic kidney disease: Secondary | ICD-10-CM | POA: Diagnosis not present

## 2017-06-12 DIAGNOSIS — N2581 Secondary hyperparathyroidism of renal origin: Secondary | ICD-10-CM | POA: Diagnosis not present

## 2017-06-12 DIAGNOSIS — N186 End stage renal disease: Secondary | ICD-10-CM | POA: Diagnosis not present

## 2017-06-14 DIAGNOSIS — N2581 Secondary hyperparathyroidism of renal origin: Secondary | ICD-10-CM | POA: Diagnosis not present

## 2017-06-14 DIAGNOSIS — Z992 Dependence on renal dialysis: Secondary | ICD-10-CM | POA: Diagnosis not present

## 2017-06-14 DIAGNOSIS — D472 Monoclonal gammopathy: Secondary | ICD-10-CM | POA: Diagnosis not present

## 2017-06-14 DIAGNOSIS — D631 Anemia in chronic kidney disease: Secondary | ICD-10-CM | POA: Diagnosis not present

## 2017-06-14 DIAGNOSIS — N186 End stage renal disease: Secondary | ICD-10-CM | POA: Diagnosis not present

## 2017-06-17 DIAGNOSIS — D631 Anemia in chronic kidney disease: Secondary | ICD-10-CM | POA: Diagnosis not present

## 2017-06-17 DIAGNOSIS — N2581 Secondary hyperparathyroidism of renal origin: Secondary | ICD-10-CM | POA: Diagnosis not present

## 2017-06-17 DIAGNOSIS — Z992 Dependence on renal dialysis: Secondary | ICD-10-CM | POA: Diagnosis not present

## 2017-06-17 DIAGNOSIS — N186 End stage renal disease: Secondary | ICD-10-CM | POA: Diagnosis not present

## 2017-06-17 DIAGNOSIS — D472 Monoclonal gammopathy: Secondary | ICD-10-CM | POA: Diagnosis not present

## 2017-06-19 DIAGNOSIS — N186 End stage renal disease: Secondary | ICD-10-CM | POA: Diagnosis not present

## 2017-06-19 DIAGNOSIS — Z992 Dependence on renal dialysis: Secondary | ICD-10-CM | POA: Diagnosis not present

## 2017-06-19 DIAGNOSIS — D631 Anemia in chronic kidney disease: Secondary | ICD-10-CM | POA: Diagnosis not present

## 2017-06-19 DIAGNOSIS — D472 Monoclonal gammopathy: Secondary | ICD-10-CM | POA: Diagnosis not present

## 2017-06-19 DIAGNOSIS — N2581 Secondary hyperparathyroidism of renal origin: Secondary | ICD-10-CM | POA: Diagnosis not present

## 2017-06-20 DIAGNOSIS — I10 Essential (primary) hypertension: Secondary | ICD-10-CM | POA: Diagnosis not present

## 2017-06-20 DIAGNOSIS — Z6825 Body mass index (BMI) 25.0-25.9, adult: Secondary | ICD-10-CM | POA: Diagnosis not present

## 2017-06-20 DIAGNOSIS — L209 Atopic dermatitis, unspecified: Secondary | ICD-10-CM | POA: Diagnosis not present

## 2017-06-21 DIAGNOSIS — N186 End stage renal disease: Secondary | ICD-10-CM | POA: Diagnosis not present

## 2017-06-21 DIAGNOSIS — N2581 Secondary hyperparathyroidism of renal origin: Secondary | ICD-10-CM | POA: Diagnosis not present

## 2017-06-21 DIAGNOSIS — Z992 Dependence on renal dialysis: Secondary | ICD-10-CM | POA: Diagnosis not present

## 2017-06-21 DIAGNOSIS — D631 Anemia in chronic kidney disease: Secondary | ICD-10-CM | POA: Diagnosis not present

## 2017-06-21 DIAGNOSIS — D472 Monoclonal gammopathy: Secondary | ICD-10-CM | POA: Diagnosis not present

## 2017-06-24 DIAGNOSIS — Z992 Dependence on renal dialysis: Secondary | ICD-10-CM | POA: Diagnosis not present

## 2017-06-24 DIAGNOSIS — N2581 Secondary hyperparathyroidism of renal origin: Secondary | ICD-10-CM | POA: Diagnosis not present

## 2017-06-24 DIAGNOSIS — D631 Anemia in chronic kidney disease: Secondary | ICD-10-CM | POA: Diagnosis not present

## 2017-06-24 DIAGNOSIS — D472 Monoclonal gammopathy: Secondary | ICD-10-CM | POA: Diagnosis not present

## 2017-06-24 DIAGNOSIS — N186 End stage renal disease: Secondary | ICD-10-CM | POA: Diagnosis not present

## 2017-06-25 ENCOUNTER — Other Ambulatory Visit: Payer: Self-pay

## 2017-06-25 DIAGNOSIS — T82510A Breakdown (mechanical) of surgically created arteriovenous fistula, initial encounter: Secondary | ICD-10-CM

## 2017-06-26 DIAGNOSIS — N2581 Secondary hyperparathyroidism of renal origin: Secondary | ICD-10-CM | POA: Diagnosis not present

## 2017-06-26 DIAGNOSIS — D472 Monoclonal gammopathy: Secondary | ICD-10-CM | POA: Diagnosis not present

## 2017-06-26 DIAGNOSIS — Z992 Dependence on renal dialysis: Secondary | ICD-10-CM | POA: Diagnosis not present

## 2017-06-26 DIAGNOSIS — D631 Anemia in chronic kidney disease: Secondary | ICD-10-CM | POA: Diagnosis not present

## 2017-06-26 DIAGNOSIS — N186 End stage renal disease: Secondary | ICD-10-CM | POA: Diagnosis not present

## 2017-06-28 DIAGNOSIS — N2581 Secondary hyperparathyroidism of renal origin: Secondary | ICD-10-CM | POA: Diagnosis not present

## 2017-06-28 DIAGNOSIS — D631 Anemia in chronic kidney disease: Secondary | ICD-10-CM | POA: Diagnosis not present

## 2017-06-28 DIAGNOSIS — Z992 Dependence on renal dialysis: Secondary | ICD-10-CM | POA: Diagnosis not present

## 2017-06-28 DIAGNOSIS — D472 Monoclonal gammopathy: Secondary | ICD-10-CM | POA: Diagnosis not present

## 2017-06-28 DIAGNOSIS — N186 End stage renal disease: Secondary | ICD-10-CM | POA: Diagnosis not present

## 2017-07-01 DIAGNOSIS — D631 Anemia in chronic kidney disease: Secondary | ICD-10-CM | POA: Diagnosis not present

## 2017-07-01 DIAGNOSIS — Z992 Dependence on renal dialysis: Secondary | ICD-10-CM | POA: Diagnosis not present

## 2017-07-01 DIAGNOSIS — N2581 Secondary hyperparathyroidism of renal origin: Secondary | ICD-10-CM | POA: Diagnosis not present

## 2017-07-01 DIAGNOSIS — N186 End stage renal disease: Secondary | ICD-10-CM | POA: Diagnosis not present

## 2017-07-01 DIAGNOSIS — D472 Monoclonal gammopathy: Secondary | ICD-10-CM | POA: Diagnosis not present

## 2017-07-02 DIAGNOSIS — Z992 Dependence on renal dialysis: Secondary | ICD-10-CM | POA: Diagnosis not present

## 2017-07-02 DIAGNOSIS — N186 End stage renal disease: Secondary | ICD-10-CM | POA: Diagnosis not present

## 2017-07-02 DIAGNOSIS — I129 Hypertensive chronic kidney disease with stage 1 through stage 4 chronic kidney disease, or unspecified chronic kidney disease: Secondary | ICD-10-CM | POA: Diagnosis not present

## 2017-07-03 DIAGNOSIS — N186 End stage renal disease: Secondary | ICD-10-CM | POA: Diagnosis not present

## 2017-07-03 DIAGNOSIS — Z992 Dependence on renal dialysis: Secondary | ICD-10-CM | POA: Diagnosis not present

## 2017-07-03 DIAGNOSIS — D631 Anemia in chronic kidney disease: Secondary | ICD-10-CM | POA: Diagnosis not present

## 2017-07-03 DIAGNOSIS — N2581 Secondary hyperparathyroidism of renal origin: Secondary | ICD-10-CM | POA: Diagnosis not present

## 2017-07-03 DIAGNOSIS — D472 Monoclonal gammopathy: Secondary | ICD-10-CM | POA: Diagnosis not present

## 2017-07-05 DIAGNOSIS — N186 End stage renal disease: Secondary | ICD-10-CM | POA: Diagnosis not present

## 2017-07-05 DIAGNOSIS — N2581 Secondary hyperparathyroidism of renal origin: Secondary | ICD-10-CM | POA: Diagnosis not present

## 2017-07-05 DIAGNOSIS — D472 Monoclonal gammopathy: Secondary | ICD-10-CM | POA: Diagnosis not present

## 2017-07-05 DIAGNOSIS — Z992 Dependence on renal dialysis: Secondary | ICD-10-CM | POA: Diagnosis not present

## 2017-07-05 DIAGNOSIS — D631 Anemia in chronic kidney disease: Secondary | ICD-10-CM | POA: Diagnosis not present

## 2017-07-08 DIAGNOSIS — N2581 Secondary hyperparathyroidism of renal origin: Secondary | ICD-10-CM | POA: Diagnosis not present

## 2017-07-08 DIAGNOSIS — D631 Anemia in chronic kidney disease: Secondary | ICD-10-CM | POA: Diagnosis not present

## 2017-07-08 DIAGNOSIS — D472 Monoclonal gammopathy: Secondary | ICD-10-CM | POA: Diagnosis not present

## 2017-07-08 DIAGNOSIS — N186 End stage renal disease: Secondary | ICD-10-CM | POA: Diagnosis not present

## 2017-07-08 DIAGNOSIS — Z992 Dependence on renal dialysis: Secondary | ICD-10-CM | POA: Diagnosis not present

## 2017-07-10 DIAGNOSIS — D472 Monoclonal gammopathy: Secondary | ICD-10-CM | POA: Diagnosis not present

## 2017-07-10 DIAGNOSIS — N2581 Secondary hyperparathyroidism of renal origin: Secondary | ICD-10-CM | POA: Diagnosis not present

## 2017-07-10 DIAGNOSIS — D631 Anemia in chronic kidney disease: Secondary | ICD-10-CM | POA: Diagnosis not present

## 2017-07-10 DIAGNOSIS — Z992 Dependence on renal dialysis: Secondary | ICD-10-CM | POA: Diagnosis not present

## 2017-07-10 DIAGNOSIS — N186 End stage renal disease: Secondary | ICD-10-CM | POA: Diagnosis not present

## 2017-07-12 DIAGNOSIS — D631 Anemia in chronic kidney disease: Secondary | ICD-10-CM | POA: Diagnosis not present

## 2017-07-12 DIAGNOSIS — Z992 Dependence on renal dialysis: Secondary | ICD-10-CM | POA: Diagnosis not present

## 2017-07-12 DIAGNOSIS — N186 End stage renal disease: Secondary | ICD-10-CM | POA: Diagnosis not present

## 2017-07-12 DIAGNOSIS — N2581 Secondary hyperparathyroidism of renal origin: Secondary | ICD-10-CM | POA: Diagnosis not present

## 2017-07-12 DIAGNOSIS — D472 Monoclonal gammopathy: Secondary | ICD-10-CM | POA: Diagnosis not present

## 2017-07-15 DIAGNOSIS — N2581 Secondary hyperparathyroidism of renal origin: Secondary | ICD-10-CM | POA: Diagnosis not present

## 2017-07-15 DIAGNOSIS — N186 End stage renal disease: Secondary | ICD-10-CM | POA: Diagnosis not present

## 2017-07-15 DIAGNOSIS — D472 Monoclonal gammopathy: Secondary | ICD-10-CM | POA: Diagnosis not present

## 2017-07-15 DIAGNOSIS — D631 Anemia in chronic kidney disease: Secondary | ICD-10-CM | POA: Diagnosis not present

## 2017-07-15 DIAGNOSIS — Z992 Dependence on renal dialysis: Secondary | ICD-10-CM | POA: Diagnosis not present

## 2017-07-16 ENCOUNTER — Encounter: Payer: Self-pay | Admitting: Vascular Surgery

## 2017-07-17 DIAGNOSIS — D472 Monoclonal gammopathy: Secondary | ICD-10-CM | POA: Diagnosis not present

## 2017-07-17 DIAGNOSIS — Z992 Dependence on renal dialysis: Secondary | ICD-10-CM | POA: Diagnosis not present

## 2017-07-17 DIAGNOSIS — D631 Anemia in chronic kidney disease: Secondary | ICD-10-CM | POA: Diagnosis not present

## 2017-07-17 DIAGNOSIS — N186 End stage renal disease: Secondary | ICD-10-CM | POA: Diagnosis not present

## 2017-07-17 DIAGNOSIS — N2581 Secondary hyperparathyroidism of renal origin: Secondary | ICD-10-CM | POA: Diagnosis not present

## 2017-07-18 ENCOUNTER — Ambulatory Visit (HOSPITAL_COMMUNITY)
Admission: RE | Admit: 2017-07-18 | Discharge: 2017-07-18 | Disposition: A | Discharge status: Home / Self Care | Payer: Medicare Other | Source: Ambulatory Visit | Attending: Vascular Surgery | Admitting: Vascular Surgery

## 2017-07-18 DIAGNOSIS — T82510A Breakdown (mechanical) of surgically created arteriovenous fistula, initial encounter: Secondary | ICD-10-CM | POA: Diagnosis not present

## 2017-07-18 DIAGNOSIS — Z87891 Personal history of nicotine dependence: Secondary | ICD-10-CM | POA: Insufficient documentation

## 2017-07-18 DIAGNOSIS — Y838 Other surgical procedures as the cause of abnormal reaction of the patient, or of later complication, without mention of misadventure at the time of the procedure: Secondary | ICD-10-CM | POA: Diagnosis not present

## 2017-07-18 DIAGNOSIS — I10 Essential (primary) hypertension: Secondary | ICD-10-CM | POA: Insufficient documentation

## 2017-07-19 DIAGNOSIS — D631 Anemia in chronic kidney disease: Secondary | ICD-10-CM | POA: Diagnosis not present

## 2017-07-19 DIAGNOSIS — N186 End stage renal disease: Secondary | ICD-10-CM | POA: Diagnosis not present

## 2017-07-19 DIAGNOSIS — N2581 Secondary hyperparathyroidism of renal origin: Secondary | ICD-10-CM | POA: Diagnosis not present

## 2017-07-19 DIAGNOSIS — Z992 Dependence on renal dialysis: Secondary | ICD-10-CM | POA: Diagnosis not present

## 2017-07-19 DIAGNOSIS — D472 Monoclonal gammopathy: Secondary | ICD-10-CM | POA: Diagnosis not present

## 2017-07-22 DIAGNOSIS — D631 Anemia in chronic kidney disease: Secondary | ICD-10-CM | POA: Diagnosis not present

## 2017-07-22 DIAGNOSIS — Z992 Dependence on renal dialysis: Secondary | ICD-10-CM | POA: Diagnosis not present

## 2017-07-22 DIAGNOSIS — N2581 Secondary hyperparathyroidism of renal origin: Secondary | ICD-10-CM | POA: Diagnosis not present

## 2017-07-22 DIAGNOSIS — N186 End stage renal disease: Secondary | ICD-10-CM | POA: Diagnosis not present

## 2017-07-22 DIAGNOSIS — D472 Monoclonal gammopathy: Secondary | ICD-10-CM | POA: Diagnosis not present

## 2017-07-24 DIAGNOSIS — Z992 Dependence on renal dialysis: Secondary | ICD-10-CM | POA: Diagnosis not present

## 2017-07-24 DIAGNOSIS — D631 Anemia in chronic kidney disease: Secondary | ICD-10-CM | POA: Diagnosis not present

## 2017-07-24 DIAGNOSIS — N2581 Secondary hyperparathyroidism of renal origin: Secondary | ICD-10-CM | POA: Diagnosis not present

## 2017-07-24 DIAGNOSIS — D472 Monoclonal gammopathy: Secondary | ICD-10-CM | POA: Diagnosis not present

## 2017-07-24 DIAGNOSIS — N186 End stage renal disease: Secondary | ICD-10-CM | POA: Diagnosis not present

## 2017-07-25 ENCOUNTER — Encounter: Payer: Self-pay | Admitting: Vascular Surgery

## 2017-07-25 ENCOUNTER — Ambulatory Visit (INDEPENDENT_AMBULATORY_CARE_PROVIDER_SITE_OTHER): Payer: Medicare Other | Admitting: Vascular Surgery

## 2017-07-25 ENCOUNTER — Encounter: Payer: Self-pay | Admitting: *Deleted

## 2017-07-25 ENCOUNTER — Other Ambulatory Visit: Payer: Self-pay | Admitting: *Deleted

## 2017-07-25 VITALS — BP 130/73 | HR 59 | Temp 97.0°F | Resp 16 | Ht 69.0 in | Wt 157.0 lb

## 2017-07-25 DIAGNOSIS — N184 Chronic kidney disease, stage 4 (severe): Secondary | ICD-10-CM | POA: Diagnosis not present

## 2017-07-25 NOTE — Progress Notes (Signed)
Patient name: Lonnie [name] MRN: [id number] DOB: 07-Mar-1925 Sex: male  REASON FOR VISIT:    To evaluate for steal syndrome. The consult is requested by Dr. Madelon Lips.  HPI:   Lonnie [name] is a pleasant 83 y.o. male who was last seen in our office in January of this year by Dr. Adele Barthel. The patient had plication of a left brachiocephalic AV fistula on 01/65/5374. At the time of last visit the wounds were healing and the patient had no steal symptoms. The patient apparently has a history of some left subclavian artery stenosis but at that time was not interested in intervention given the risk of renal failure.  Since that visit in January of this year, the patient is now on dialysis on Monday Wednesdays and Fridays. He has had problems with pain in the left hand and coolness. He currently uses his fistula on Monday Wednesdays and Fridays for dialysis and it is working well. Symptoms are sometimes worse on dialysis.  Past Medical History:  Diagnosis Date  . Adenomatous polyps   . Bursitis    left hip  . Chronic kidney disease    Stage 4  . Frequent urination at night   . GERD (gastroesophageal reflux disease)   . Hypertension   . Hypothyroidism   . Pneumonia   . Polyclonal gammopathy determined by serum protein electrophoresis 10/04/2015    Family History  Problem Relation Age of Onset  . Diabetes Father   . Diabetes Brother   . Heart disease Brother   . Colon cancer Neg Hx   . Rectal cancer Neg Hx     SOCIAL HISTORY: Social History  Substance Use Topics  . Smoking status: Former Smoker    Quit date: 01/25/1980  . Smokeless tobacco: Never Used  . Alcohol use No     Comment: former alcoholic 32 years ago was last drink    Allergies  Allergen Reactions  . No Known Allergies     Current Outpatient Prescriptions  Medication Sig Dispense Refill  . amLODipine (NORVASC) 5 MG tablet Take 5 mg by mouth at bedtime.     . B Complex-C-Folic Acid (DIALYVITE 827)  0.8 MG TABS Take 1 tablet by mouth daily.    . calcium acetate (PHOSLO) 667 MG capsule Take 1 capsule by mouth 3 (three) times daily.  0  . clotrimazole (LOTRIMIN) 1 % cream Apply 1 application topically 2 (two) times daily. Use of 7-10 days. (Patient taking differently: Apply 1 application topically 2 (two) times daily as needed (for rash). ) 30 g 0  . escitalopram (LEXAPRO) 10 MG tablet Take 10 mg by mouth at bedtime.     [name] levothyroxine (SYNTHROID, LEVOTHROID) 88 MCG tablet Take 88 mcg by mouth at bedtime.    . lidocaine-prilocaine (EMLA) cream Apply 1 application topically daily as needed.  0  . mirabegron ER (MYRBETRIQ) 50 MG TB24 tablet Take 50 mg by mouth daily.    . polyvinyl alcohol (LIQUIFILM TEARS) 1.4 % ophthalmic solution Place 1-2 drops into both eyes as needed for dry eyes.    . ranitidine (ZANTAC) 150 MG capsule Take 150 mg by mouth 2 (two) times daily.     No current facility-administered medications for this visit.     REVIEW OF SYSTEMS:  [X]  denotes positive finding, [ ]  denotes negative finding Cardiac  Comments:  Chest pain or chest pressure:    Shortness of breath upon exertion:    Short of breath when lying  flat:    Irregular heart rhythm:        Vascular    Pain in calf, thigh, or hip brought on by ambulation:    Pain in feet at night that wakes you up from your sleep:     Blood clot in your veins:    Leg swelling:         Pulmonary    Oxygen at home:    Productive cough:     Wheezing:         Neurologic    Sudden weakness in arms or legs:     Sudden numbness in arms or legs:     Sudden onset of difficulty speaking or slurred speech:    Temporary loss of vision in one eye:     Problems with dizziness:         Gastrointestinal    Blood in stool:     Vomited blood:         Genitourinary    Burning when urinating:     Blood in urine:        Psychiatric    Major depression:         Hematologic    Bleeding problems:    Problems with blood  clotting too easily:        Skin    Rashes or ulcers:        Constitutional    Fever or chills:     PHYSICAL EXAM:   Vitals:   07/25/17 1123  BP: 130/73  Pulse: (!) 59  Resp: 16  Temp: (!) 97 F (36.1 C)  TempSrc: Oral  SpO2: 99%  Weight: 157 lb (71.2 kg)  Height: 5\' 9"  (1.753 m)    GENERAL: The patient is a well-nourished male, in no acute distress. The vital signs are documented above. CARDIAC: There is a regular rate and rhythm.  VASCULAR: He has a good thrill in his left upper arm fistula. He has a palpable but slightly diminished left radial pulse. He has palpable femoral pulses. PULMONARY: There is good air exchange bilaterally without wheezing or rales. NEUROLOGIC: No focal weakness or paresthesias are detected. SKIN: There are no ulcers or rashes noted. PSYCHIATRIC: The patient has a normal affect.  DATA:    No new data.  MEDICAL ISSUES:   STEAL SYMPTOMS LEFT UPPER EXTREMITY: This patient has a functioning left upper arm fistula. He is having some pain and coolness in the left hand consistent with steal symptoms. He did not have a formal Doppler study today but his Doppler signal hand augments with compression of the fistula. Previously, Dr. Bridgett Larsson was concerned that he might have a subclavian stenosis. He had discuss possible arteriography and subclavian angioplasty however the patient at that time was not on dialysis and did not wish to proceed. Now that he is on dialysis, and given his recurrent steal symptoms, I think it would be reasonable to proceed with arch aortogram, left upper extremity arteriogram, and possible stenting if a left subclavian artery stenosis is identified. We have discussed indications for the procedure and the potential complications and he is agreeable to proceed.  The patient dialyzes on Monday Wednesdays and Fridays and so we will try to schedule this on a Thursday with Dr. Bridgett Larsson.   Deitra Mayo [facility] [phone]

## 2017-07-26 DIAGNOSIS — D472 Monoclonal gammopathy: Secondary | ICD-10-CM | POA: Diagnosis not present

## 2017-07-26 DIAGNOSIS — N186 End stage renal disease: Secondary | ICD-10-CM | POA: Diagnosis not present

## 2017-07-26 DIAGNOSIS — Z992 Dependence on renal dialysis: Secondary | ICD-10-CM | POA: Diagnosis not present

## 2017-07-26 DIAGNOSIS — N2581 Secondary hyperparathyroidism of renal origin: Secondary | ICD-10-CM | POA: Diagnosis not present

## 2017-07-26 DIAGNOSIS — D631 Anemia in chronic kidney disease: Secondary | ICD-10-CM | POA: Diagnosis not present

## 2017-07-29 DIAGNOSIS — N2581 Secondary hyperparathyroidism of renal origin: Secondary | ICD-10-CM | POA: Diagnosis not present

## 2017-07-29 DIAGNOSIS — Z992 Dependence on renal dialysis: Secondary | ICD-10-CM | POA: Diagnosis not present

## 2017-07-29 DIAGNOSIS — D631 Anemia in chronic kidney disease: Secondary | ICD-10-CM | POA: Diagnosis not present

## 2017-07-29 DIAGNOSIS — D472 Monoclonal gammopathy: Secondary | ICD-10-CM | POA: Diagnosis not present

## 2017-07-29 DIAGNOSIS — N186 End stage renal disease: Secondary | ICD-10-CM | POA: Diagnosis not present

## 2017-07-31 DIAGNOSIS — Z992 Dependence on renal dialysis: Secondary | ICD-10-CM | POA: Diagnosis not present

## 2017-07-31 DIAGNOSIS — N2581 Secondary hyperparathyroidism of renal origin: Secondary | ICD-10-CM | POA: Diagnosis not present

## 2017-07-31 DIAGNOSIS — D472 Monoclonal gammopathy: Secondary | ICD-10-CM | POA: Diagnosis not present

## 2017-07-31 DIAGNOSIS — N186 End stage renal disease: Secondary | ICD-10-CM | POA: Diagnosis not present

## 2017-07-31 DIAGNOSIS — D631 Anemia in chronic kidney disease: Secondary | ICD-10-CM | POA: Diagnosis not present

## 2017-08-01 ENCOUNTER — Ambulatory Visit (HOSPITAL_COMMUNITY)
Admission: RE | Admit: 2017-08-01 | Discharge: 2017-08-01 | Disposition: A | Discharge status: Home / Self Care | Payer: Medicare Other | Source: Ambulatory Visit | Attending: Vascular Surgery | Admitting: Vascular Surgery

## 2017-08-01 ENCOUNTER — Encounter (HOSPITAL_COMMUNITY)
Admission: RE | Disposition: A | Discharge status: Home / Self Care | Payer: Self-pay | Source: Ambulatory Visit | Attending: Vascular Surgery

## 2017-08-01 ENCOUNTER — Encounter (HOSPITAL_COMMUNITY): Payer: Self-pay | Admitting: Vascular Surgery

## 2017-08-01 DIAGNOSIS — E039 Hypothyroidism, unspecified: Secondary | ICD-10-CM | POA: Insufficient documentation

## 2017-08-01 DIAGNOSIS — Z79899 Other long term (current) drug therapy: Secondary | ICD-10-CM | POA: Diagnosis not present

## 2017-08-01 DIAGNOSIS — Z992 Dependence on renal dialysis: Secondary | ICD-10-CM | POA: Insufficient documentation

## 2017-08-01 DIAGNOSIS — T82898A Other specified complication of vascular prosthetic devices, implants and grafts, initial encounter: Secondary | ICD-10-CM | POA: Insufficient documentation

## 2017-08-01 DIAGNOSIS — N186 End stage renal disease: Secondary | ICD-10-CM

## 2017-08-01 DIAGNOSIS — Y832 Surgical operation with anastomosis, bypass or graft as the cause of abnormal reaction of the patient, or of later complication, without mention of misadventure at the time of the procedure: Secondary | ICD-10-CM | POA: Insufficient documentation

## 2017-08-01 DIAGNOSIS — I129 Hypertensive chronic kidney disease with stage 1 through stage 4 chronic kidney disease, or unspecified chronic kidney disease: Secondary | ICD-10-CM | POA: Insufficient documentation

## 2017-08-01 DIAGNOSIS — N184 Chronic kidney disease, stage 4 (severe): Secondary | ICD-10-CM | POA: Diagnosis not present

## 2017-08-01 DIAGNOSIS — Y9289 Other specified places as the place of occurrence of the external cause: Secondary | ICD-10-CM | POA: Diagnosis not present

## 2017-08-01 HISTORY — PX: UPPER EXTREMITY ANGIOGRAPHY: CATH118270

## 2017-08-01 HISTORY — PX: AORTIC ARCH ANGIOGRAPHY: CATH118224

## 2017-08-01 LAB — POCT I-STAT, CHEM 8
BUN: 38 mg/dL — ABNORMAL HIGH (ref 6–20)
CALCIUM ION: 1.14 mmol/L — AB (ref 1.15–1.40)
CHLORIDE: 99 mmol/L — AB (ref 101–111)
Creatinine, Ser: 3.8 mg/dL — ABNORMAL HIGH (ref 0.61–1.24)
Glucose, Bld: 89 mg/dL (ref 65–99)
HEMATOCRIT: 38 % — AB (ref 39.0–52.0)
Hemoglobin: 12.9 g/dL — ABNORMAL LOW (ref 13.0–17.0)
POTASSIUM: 4.6 mmol/L (ref 3.5–5.1)
SODIUM: 139 mmol/L (ref 135–145)
TCO2: 32 mmol/L (ref 22–32)

## 2017-08-01 SURGERY — UPPER EXTREMITY ANGIOGRAPHY
Anesthesia: LOCAL

## 2017-08-01 MED ORDER — IODIXANOL 320 MG/ML IV SOLN
INTRAVENOUS | Status: DC | PRN
  Administered 2017-08-01: 165 mL via INTRAVENOUS

## 2017-08-01 MED ORDER — HEPARIN (PORCINE) IN NACL 2-0.9 UNIT/ML-% IJ SOLN
INTRAMUSCULAR | Status: AC
  Filled 2017-08-01: qty 1000

## 2017-08-01 MED ORDER — SODIUM CHLORIDE 0.9% FLUSH: 3.0000 mL | Freq: Two times a day (BID) | INTRAVENOUS | Status: DC

## 2017-08-01 MED ORDER — LIDOCAINE HCL (PF) 1 % IJ SOLN
INTRAMUSCULAR | Status: AC
  Filled 2017-08-01: qty 30

## 2017-08-01 MED ORDER — LABETALOL HCL 5 MG/ML IV SOLN: 10.0000 mg | INTRAVENOUS | Status: DC | PRN

## 2017-08-01 MED ORDER — SODIUM CHLORIDE 0.9% FLUSH: 3.0000 mL | INTRAVENOUS | Status: DC | PRN

## 2017-08-01 MED ORDER — HYDRALAZINE HCL 20 MG/ML IJ SOLN: 5.0000 mg | INTRAMUSCULAR | Status: DC | PRN

## 2017-08-01 MED ORDER — SODIUM CHLORIDE 0.9 % IV SOLN: 250.0000 mL | INTRAVENOUS | Status: DC | PRN

## 2017-08-01 MED ORDER — HEPARIN (PORCINE) IN NACL 2-0.9 UNIT/ML-% IJ SOLN
INTRAMUSCULAR | Status: AC | PRN
  Administered 2017-08-01: 1000 mL

## 2017-08-01 SURGICAL SUPPLY — 15 items
CATH ANGIO 5F BER2 100CM (CATHETERS) ×3 IMPLANT
CATH ANGIO 5F PIGTAIL 100CM (CATHETERS) ×3 IMPLANT
CATH NAVICROSS ST .035X135CM (MICROCATHETER) ×3 IMPLANT
COVER PRB 48X5XTLSCP FOLD TPE (BAG) ×2 IMPLANT
COVER PROBE 5X48 (BAG) ×1
GUIDEWIRE ANGLED .035X260CM (WIRE) ×3 IMPLANT
KIT MICROINTRODUCER STIFF 5F (SHEATH) ×3 IMPLANT
KIT PV (KITS) ×3 IMPLANT
SHEATH PINNACLE 5F 10CM (SHEATH) ×3 IMPLANT
SYR MEDRAD MARK V 150ML (SYRINGE) ×3 IMPLANT
TRANSDUCER W/STOPCOCK (MISCELLANEOUS) ×3 IMPLANT
TRAY PV CATH (CUSTOM PROCEDURE TRAY) ×3 IMPLANT
WIRE BENTSON .035X145CM (WIRE) ×3 IMPLANT
WIRE HI TORQ VERSACORE J 260CM (WIRE) ×3 IMPLANT
WIRE ROSEN-J .035X260CM (WIRE) ×3 IMPLANT

## 2017-08-01 NOTE — Interval H&P Note (Signed)
   History and Physical Update  The patient was interviewed and re-examined.  The patient's previous History and Physical has been reviewed and is unchanged from Dr. Nicole Cella consult.  There is no change in the plan of care: Arch aortogram, left arm angiogram, possible intervention.   I discussed with the patient the nature of angiographic procedures, especially the limited patencies of any endovascular intervention.    The patient is aware of that the risks of an angiographic procedure include but are not limited to: bleeding, infection, access site complications, renal failure, embolization, rupture of vessel, dissection, arteriovenous fistula, possible need for emergent surgical intervention, possible need for surgical procedures to treat the patient's pathology, anaphylactic reaction to contrast, and stroke and death.    I reiterated to the patient the risk of stroke related to the aortic arch.  The patient is aware of the risks and agrees to proceed.   Adele Barthel, MD, FACS [facility] Office: [phone] Pager: [phone]  08/01/2017, 7:05 AM

## 2017-08-01 NOTE — Op Note (Signed)
OPERATIVE NOTE   PROCEDURE: 1.  right common femoral artery cannulation under ultrasound guidance 2.  Placement of catheter in aorta 3.  Arch Aortogram 4.  Third order arterial selection 5.  Left arm angiogram 6.  Left arm fistulogram  PRE-OPERATIVE DIAGNOSIS: steal syndrome left hand  POST-OPERATIVE DIAGNOSIS: same as above   SURGEON: Adele Barthel, MD  ANESTHESIA: local anesthesia  ESTIMATED BLOOD LOSS: 50 cc  CONTRAST: 165 cc  FINDING(S):  Type 2 aortic arch: patent, but wire and catheter behavior suggestive of extensive calcific plaque  Innominate artery: patent  R common carotid artery: proximally patent  R internal carotid artery: proximally patent  R external carotid artery: proximally patent  R subclavian artery: proximally patent  R vertebral artery: proximally patent  Left common carotid artery: proximally patent  L internal carotid artery: proximally patent  L external carotid artery: proximally patent  Left subclavian artery: patent throughout  Left vertebral artery: proximally patent  Left axillary and brachial artery patent  Brachial bifurcation in proximal forearm:  Radial artery patent throughout  Ulnar artery attenuates and diminishes until there is minimal flow in the distal ulnar  Palmar arch is mostly intact, but does not fill retrograde ulnar artery on imaging  Digital arteries inadequately imaged to comment  Patent brachiocephalic arteriovenous fistula throughout with rapid filling  Distal segment is tapered  Angiographic evident of significant steal with limited filling of distal forearm arteries evident  SPECIMEN(S):  none  INDICATIONS:   Lonnie [name] is a 83 y.o. male with end stage renal disease requiring who presents with left hand steal syndrome.  The patient presents for: B carotid and cerebral angiography.  I discussed with the patient the nature of angiographic procedures, especially the limited patencies of any  endovascular intervention.  The patient is aware of that the risks of an angiographic procedure include but are not limited to: bleeding, infection, access site complications, renal failure, embolization, rupture of vessel, dissection, possible need for emergent surgical intervention, possible need for surgical procedures to treat the patient's pathology, and stroke and death.  The patient is aware of the risks and agrees to proceed.  DESCRIPTION: After full informed consent was obtained from the patient, the patient was brought back to the angiography suite.  The patient was placed supine upon the angiography table and connected to cardiopulmonary monitoring equipment.  A circulating radiologic technician maintained continuous monitoring of the patient's cardiopulmonary status.  Additionally, the control room radiologic technician provided backup monitoring throughout the procedure.  The patient was prepped and drape in the standard fashion for an angiographic procedure.  At this point, attention was turned to the right groin.  Under ultrasound guidance, the subcutaneous tissue surrounding the right common femoral artery was anesthesized with 1% lidocaine with epinephrine.  This right common femoral artery was somewhat higher underneath his pannus.  The artery was then cannulated with a micropuncture needle.  The microwire was advanced into the iliac arterial system.  The needle was exchanged for a microsheath, which was loaded into the common femoral artery over the wire.  The microwire was exchanged for a Bentson wire which was advanced into the aorta.  The microsheath was then exchanged for a 5-Fr sheath which was loaded into the common femoral artery.  The long pigtail catheter was then loaded over the wire up to the level of ascending aorta.  The catheter was connected to the power injector circuit.  After de-airring and de-clotting the circuit, a power injector arch  aortogram was completed at 40 degree  LAO.    The catheter was then pulled into the descending thoracic aorta.  The catheter was exchanged for a BER-2 catheter over the Bentson wire.  Using this combination, I selected the left subclavian artery.  I advanced the catheter initially into the mid-subclavian artery artery and did one power injection of the the upper arm.  The catheter was too short so I placed a Versacore wire through the catheter and exchanged it for a longer Glidecath which I advanced into the axillary artery.  The upper arm angiogram was completed in station.  I also imaged the fistula at the same time.  I tried to image the left forearm arteries by compressing the fistula but the images were inadequate.  I placed a glidewire through the catheter and steered the wire into the radial artery.  I advanced the catheter into the distal brachial artery distal to the fistula's anastomosis.  The fistula was compressed again and a repeat injection of the forearm and hand was completed.  The findings are listed above.  At this point, I removed the catheter.  The sheath was aspirated.  No clots were present and the sheath was reloaded with heparinized saline.    Essentially, the fistula is already plicated as its distal segment is already tapered.  The patient has a functional ulnar artery occlusion with small radial artery.  Unclear if patient also has some degree of digital arteries as the imaging is inadequate.  As this patient's disease is in the distal forearm and possibly hand, any of the procedures designed for steal mitigation are unlikely to work.  The only realistic option is ligation of the fistula.  At this point, patient does not feel his steal symptoms merit ligation of the fistula.  We discussed continuing to monitor his symptoms.   COMPLICATIONS: none  CONDITION: stable   Adele Barthel, MD, [facility] [place] Office: [phone] Pager: [phone]  08/01/2017, 8:43 AM

## 2017-08-01 NOTE — H&P (View-Only) (Signed)
Patient name: Lonnie [name] MRN: [id number] DOB: 1925-02-25 Sex: male  REASON FOR VISIT:    To evaluate for steal syndrome. The consult is requested by Dr. Madelon Lips.  HPI:   Lonnie [name] is a pleasant 83 y.o. male who was last seen in our office in January of this year by Dr. Adele Barthel. The patient had plication of a left brachiocephalic AV fistula on 37/09/6268. At the time of last visit the wounds were healing and the patient had no steal symptoms. The patient apparently has a history of some left subclavian artery stenosis but at that time was not interested in intervention given the risk of renal failure.  Since that visit in January of this year, the patient is now on dialysis on Monday Wednesdays and Fridays. He has had problems with pain in the left hand and coolness. He currently uses his fistula on Monday Wednesdays and Fridays for dialysis and it is working well. Symptoms are sometimes worse on dialysis.  Past Medical History:  Diagnosis Date  . Adenomatous polyps   . Bursitis    left hip  . Chronic kidney disease    Stage 4  . Frequent urination at night   . GERD (gastroesophageal reflux disease)   . Hypertension   . Hypothyroidism   . Pneumonia   . Polyclonal gammopathy determined by serum protein electrophoresis 10/04/2015    Family History  Problem Relation Age of Onset  . Diabetes Father   . Diabetes Brother   . Heart disease Brother   . Colon cancer Neg Hx   . Rectal cancer Neg Hx     SOCIAL HISTORY: Social History  Substance Use Topics  . Smoking status: Former Smoker    Quit date: 01/25/1980  . Smokeless tobacco: Never Used  . Alcohol use No     Comment: former alcoholic 32 years ago was last drink    Allergies  Allergen Reactions  . No Known Allergies     Current Outpatient Prescriptions  Medication Sig Dispense Refill  . amLODipine (NORVASC) 5 MG tablet Take 5 mg by mouth at bedtime.     . B Complex-C-Folic Acid (DIALYVITE 485)  0.8 MG TABS Take 1 tablet by mouth daily.    . calcium acetate (PHOSLO) 667 MG capsule Take 1 capsule by mouth 3 (three) times daily.  0  . clotrimazole (LOTRIMIN) 1 % cream Apply 1 application topically 2 (two) times daily. Use of 7-10 days. (Patient taking differently: Apply 1 application topically 2 (two) times daily as needed (for rash). ) 30 g 0  . escitalopram (LEXAPRO) 10 MG tablet Take 10 mg by mouth at bedtime.     [name] levothyroxine (SYNTHROID, LEVOTHROID) 88 MCG tablet Take 88 mcg by mouth at bedtime.    . lidocaine-prilocaine (EMLA) cream Apply 1 application topically daily as needed.  0  . mirabegron ER (MYRBETRIQ) 50 MG TB24 tablet Take 50 mg by mouth daily.    . polyvinyl alcohol (LIQUIFILM TEARS) 1.4 % ophthalmic solution Place 1-2 drops into both eyes as needed for dry eyes.    . ranitidine (ZANTAC) 150 MG capsule Take 150 mg by mouth 2 (two) times daily.     No current facility-administered medications for this visit.     REVIEW OF SYSTEMS:  [X]  denotes positive finding, [ ]  denotes negative finding Cardiac  Comments:  Chest pain or chest pressure:    Shortness of breath upon exertion:    Short of breath when lying  flat:    Irregular heart rhythm:        Vascular    Pain in calf, thigh, or hip brought on by ambulation:    Pain in feet at night that wakes you up from your sleep:     Blood clot in your veins:    Leg swelling:         Pulmonary    Oxygen at home:    Productive cough:     Wheezing:         Neurologic    Sudden weakness in arms or legs:     Sudden numbness in arms or legs:     Sudden onset of difficulty speaking or slurred speech:    Temporary loss of vision in one eye:     Problems with dizziness:         Gastrointestinal    Blood in stool:     Vomited blood:         Genitourinary    Burning when urinating:     Blood in urine:        Psychiatric    Major depression:         Hematologic    Bleeding problems:    Problems with blood  clotting too easily:        Skin    Rashes or ulcers:        Constitutional    Fever or chills:     PHYSICAL EXAM:   Vitals:   07/25/17 1123  BP: 130/73  Pulse: (!) 59  Resp: 16  Temp: (!) 97 F (36.1 C)  TempSrc: Oral  SpO2: 99%  Weight: 157 lb (71.2 kg)  Height: 5\' 9"  (1.753 m)    GENERAL: The patient is a well-nourished male, in no acute distress. The vital signs are documented above. CARDIAC: There is a regular rate and rhythm.  VASCULAR: He has a good thrill in his left upper arm fistula. He has a palpable but slightly diminished left radial pulse. He has palpable femoral pulses. PULMONARY: There is good air exchange bilaterally without wheezing or rales. NEUROLOGIC: No focal weakness or paresthesias are detected. SKIN: There are no ulcers or rashes noted. PSYCHIATRIC: The patient has a normal affect.  DATA:    No new data.  MEDICAL ISSUES:   STEAL SYMPTOMS LEFT UPPER EXTREMITY: This patient has a functioning left upper arm fistula. He is having some pain and coolness in the left hand consistent with steal symptoms. He did not have a formal Doppler study today but his Doppler signal hand augments with compression of the fistula. Previously, Dr. Bridgett Larsson was concerned that he might have a subclavian stenosis. He had discuss possible arteriography and subclavian angioplasty however the patient at that time was not on dialysis and did not wish to proceed. Now that he is on dialysis, and given his recurrent steal symptoms, I think it would be reasonable to proceed with arch aortogram, left upper extremity arteriogram, and possible stenting if a left subclavian artery stenosis is identified. We have discussed indications for the procedure and the potential complications and he is agreeable to proceed.  The patient dialyzes on Monday Wednesdays and Fridays and so we will try to schedule this on a Thursday with Dr. Bridgett Larsson.   Deitra Mayo [facility] [phone]

## 2017-08-01 NOTE — Discharge Instructions (Signed)
Angiogram, Care After °This sheet gives you information about how to care for yourself after your procedure. Your health care provider may also give you more specific instructions. If you have problems or questions, contact your health care provider. °What can I expect after the procedure? °After the procedure, it is common to have bruising and tenderness at the catheter insertion area. °Follow these instructions at home: °Insertion site care  °· Follow instructions from your health care provider about how to take care of your insertion site. Make sure you: °¨ Wash your hands with soap and water before you change your bandage (dressing). If soap and water are not available, use hand sanitizer. °¨ Change your dressing as told by your health care provider. °¨ Leave stitches (sutures), skin glue, or adhesive strips in place. These skin closures may need to stay in place for 2 weeks or longer. If adhesive strip edges start to loosen and curl up, you may trim the loose edges. Do not remove adhesive strips completely unless your health care provider tells you to do that. °· Do not take baths, swim, or use a hot tub until your health care provider approves. °· You may shower 24-48 hours after the procedure or as told by your health care provider. °¨ Gently wash the site with plain soap and water. °¨ Pat the area dry with a clean towel. °¨ Do not rub the site. This may cause bleeding. °· Do not apply powder or lotion to the site. Keep the site clean and dry. °· Check your insertion site every day for signs of infection. Check for: °¨ Redness, swelling, or pain. °¨ Fluid or blood. °¨ Warmth. °¨ Pus or a bad smell. °Activity  °· Rest as told by your health care provider, usually for 1-2 days. °· Do not lift anything that is heavier than 10 lbs. (4.5 kg) or as told by your health care provider. °· Do not drive for 24 hours if you were given a medicine to help you relax (sedative). °· Do not drive or use heavy machinery while  taking prescription pain medicine. °General instructions  °· Return to your normal activities as told by your health care provider, usually in about a week. Ask your health care provider what activities are safe for you. °· If the catheter site starts bleeding, lie flat and put pressure on the site. If the bleeding does not stop, get help right away. This is a medical emergency. °· Drink enough fluid to keep your urine clear or pale yellow. This helps flush the contrast dye from your body. °· Take over-the-counter and prescription medicines only as told by your health care provider. °· Keep all follow-up visits as told by your health care provider. This is important. °Contact a health care provider if: °· You have a fever or chills. °· You have redness, swelling, or pain around your insertion site. °· You have fluid or blood coming from your insertion site. °· The insertion site feels warm to the touch. °· You have pus or a bad smell coming from your insertion site. °· You have bruising around the insertion site. °· You notice blood collecting in the tissue around the catheter site (hematoma). The hematoma may be painful to the touch. °Get help right away if: °· You have severe pain at the catheter insertion area. °· The catheter insertion area swells very fast. °· The catheter insertion area is bleeding, and the bleeding does not stop when you hold steady pressure on   the area. °· The area near or just beyond the catheter insertion site becomes pale, cool, tingly, or numb. °These symptoms may represent a serious problem that is an emergency. Do not wait to see if the symptoms will go away. Get medical help right away. Call your local emergency services (911 in the [place] Do not drive yourself to the hospital. °Summary °· After the procedure, it is common to have bruising and tenderness at the catheter insertion area. °· After the procedure, it is important to rest and drink plenty of fluids. °· Do not take baths,  swim, or use a hot tub until your health care provider says it is okay to do so. You may shower 24-48 hours after the procedure or as told by your health care provider. °· If the catheter site starts bleeding, lie flat and put pressure on the site. If the bleeding does not stop, get help right away. This is a medical emergency. °This information is not intended to replace advice given to you by your health care provider. Make sure you discuss any questions you have with your health care provider. °Document Released: 06/07/2005 Document Revised: 10/24/2016 Document Reviewed: 10/24/2016 °Elsevier Interactive Patient Education © 2017 [employer] ° °

## 2017-08-02 DIAGNOSIS — N2581 Secondary hyperparathyroidism of renal origin: Secondary | ICD-10-CM | POA: Diagnosis not present

## 2017-08-02 DIAGNOSIS — I129 Hypertensive chronic kidney disease with stage 1 through stage 4 chronic kidney disease, or unspecified chronic kidney disease: Secondary | ICD-10-CM | POA: Diagnosis not present

## 2017-08-02 DIAGNOSIS — N186 End stage renal disease: Secondary | ICD-10-CM | POA: Diagnosis not present

## 2017-08-02 DIAGNOSIS — D472 Monoclonal gammopathy: Secondary | ICD-10-CM | POA: Diagnosis not present

## 2017-08-02 DIAGNOSIS — D631 Anemia in chronic kidney disease: Secondary | ICD-10-CM | POA: Diagnosis not present

## 2017-08-02 DIAGNOSIS — Z992 Dependence on renal dialysis: Secondary | ICD-10-CM | POA: Diagnosis not present

## 2017-08-02 MED FILL — Lidocaine HCl Local Preservative Free (PF) Inj 1%: INTRAMUSCULAR | Qty: 30 | Status: AC

## 2017-08-05 DIAGNOSIS — D509 Iron deficiency anemia, unspecified: Secondary | ICD-10-CM | POA: Diagnosis not present

## 2017-08-05 DIAGNOSIS — N186 End stage renal disease: Secondary | ICD-10-CM | POA: Diagnosis not present

## 2017-08-05 DIAGNOSIS — Z23 Encounter for immunization: Secondary | ICD-10-CM | POA: Diagnosis not present

## 2017-08-05 DIAGNOSIS — D472 Monoclonal gammopathy: Secondary | ICD-10-CM | POA: Diagnosis not present

## 2017-08-05 DIAGNOSIS — D631 Anemia in chronic kidney disease: Secondary | ICD-10-CM | POA: Diagnosis not present

## 2017-08-05 DIAGNOSIS — N2581 Secondary hyperparathyroidism of renal origin: Secondary | ICD-10-CM | POA: Diagnosis not present

## 2017-08-05 DIAGNOSIS — Z992 Dependence on renal dialysis: Secondary | ICD-10-CM | POA: Diagnosis not present

## 2017-08-07 DIAGNOSIS — D472 Monoclonal gammopathy: Secondary | ICD-10-CM | POA: Diagnosis not present

## 2017-08-07 DIAGNOSIS — D631 Anemia in chronic kidney disease: Secondary | ICD-10-CM | POA: Diagnosis not present

## 2017-08-07 DIAGNOSIS — N186 End stage renal disease: Secondary | ICD-10-CM | POA: Diagnosis not present

## 2017-08-07 DIAGNOSIS — N2581 Secondary hyperparathyroidism of renal origin: Secondary | ICD-10-CM | POA: Diagnosis not present

## 2017-08-07 DIAGNOSIS — D509 Iron deficiency anemia, unspecified: Secondary | ICD-10-CM | POA: Diagnosis not present

## 2017-08-07 DIAGNOSIS — Z992 Dependence on renal dialysis: Secondary | ICD-10-CM | POA: Diagnosis not present

## 2017-08-09 DIAGNOSIS — N186 End stage renal disease: Secondary | ICD-10-CM | POA: Diagnosis not present

## 2017-08-09 DIAGNOSIS — D631 Anemia in chronic kidney disease: Secondary | ICD-10-CM | POA: Diagnosis not present

## 2017-08-09 DIAGNOSIS — N2581 Secondary hyperparathyroidism of renal origin: Secondary | ICD-10-CM | POA: Diagnosis not present

## 2017-08-09 DIAGNOSIS — D509 Iron deficiency anemia, unspecified: Secondary | ICD-10-CM | POA: Diagnosis not present

## 2017-08-09 DIAGNOSIS — Z992 Dependence on renal dialysis: Secondary | ICD-10-CM | POA: Diagnosis not present

## 2017-08-09 DIAGNOSIS — D472 Monoclonal gammopathy: Secondary | ICD-10-CM | POA: Diagnosis not present

## 2017-08-12 DIAGNOSIS — Z992 Dependence on renal dialysis: Secondary | ICD-10-CM | POA: Diagnosis not present

## 2017-08-12 DIAGNOSIS — D509 Iron deficiency anemia, unspecified: Secondary | ICD-10-CM | POA: Diagnosis not present

## 2017-08-12 DIAGNOSIS — D472 Monoclonal gammopathy: Secondary | ICD-10-CM | POA: Diagnosis not present

## 2017-08-12 DIAGNOSIS — N186 End stage renal disease: Secondary | ICD-10-CM | POA: Diagnosis not present

## 2017-08-12 DIAGNOSIS — D631 Anemia in chronic kidney disease: Secondary | ICD-10-CM | POA: Diagnosis not present

## 2017-08-12 DIAGNOSIS — N2581 Secondary hyperparathyroidism of renal origin: Secondary | ICD-10-CM | POA: Diagnosis not present

## 2017-08-14 DIAGNOSIS — N2581 Secondary hyperparathyroidism of renal origin: Secondary | ICD-10-CM | POA: Diagnosis not present

## 2017-08-14 DIAGNOSIS — N186 End stage renal disease: Secondary | ICD-10-CM | POA: Diagnosis not present

## 2017-08-14 DIAGNOSIS — D472 Monoclonal gammopathy: Secondary | ICD-10-CM | POA: Diagnosis not present

## 2017-08-14 DIAGNOSIS — Z992 Dependence on renal dialysis: Secondary | ICD-10-CM | POA: Diagnosis not present

## 2017-08-14 DIAGNOSIS — D631 Anemia in chronic kidney disease: Secondary | ICD-10-CM | POA: Diagnosis not present

## 2017-08-14 DIAGNOSIS — D509 Iron deficiency anemia, unspecified: Secondary | ICD-10-CM | POA: Diagnosis not present

## 2017-08-16 DIAGNOSIS — D509 Iron deficiency anemia, unspecified: Secondary | ICD-10-CM | POA: Diagnosis not present

## 2017-08-16 DIAGNOSIS — N186 End stage renal disease: Secondary | ICD-10-CM | POA: Diagnosis not present

## 2017-08-16 DIAGNOSIS — Z992 Dependence on renal dialysis: Secondary | ICD-10-CM | POA: Diagnosis not present

## 2017-08-16 DIAGNOSIS — D472 Monoclonal gammopathy: Secondary | ICD-10-CM | POA: Diagnosis not present

## 2017-08-16 DIAGNOSIS — N2581 Secondary hyperparathyroidism of renal origin: Secondary | ICD-10-CM | POA: Diagnosis not present

## 2017-08-16 DIAGNOSIS — D631 Anemia in chronic kidney disease: Secondary | ICD-10-CM | POA: Diagnosis not present

## 2017-08-19 DIAGNOSIS — N186 End stage renal disease: Secondary | ICD-10-CM | POA: Diagnosis not present

## 2017-08-19 DIAGNOSIS — D472 Monoclonal gammopathy: Secondary | ICD-10-CM | POA: Diagnosis not present

## 2017-08-19 DIAGNOSIS — N2581 Secondary hyperparathyroidism of renal origin: Secondary | ICD-10-CM | POA: Diagnosis not present

## 2017-08-19 DIAGNOSIS — D509 Iron deficiency anemia, unspecified: Secondary | ICD-10-CM | POA: Diagnosis not present

## 2017-08-19 DIAGNOSIS — D631 Anemia in chronic kidney disease: Secondary | ICD-10-CM | POA: Diagnosis not present

## 2017-08-19 DIAGNOSIS — Z992 Dependence on renal dialysis: Secondary | ICD-10-CM | POA: Diagnosis not present

## 2017-08-21 DIAGNOSIS — D509 Iron deficiency anemia, unspecified: Secondary | ICD-10-CM | POA: Diagnosis not present

## 2017-08-21 DIAGNOSIS — D631 Anemia in chronic kidney disease: Secondary | ICD-10-CM | POA: Diagnosis not present

## 2017-08-21 DIAGNOSIS — Z992 Dependence on renal dialysis: Secondary | ICD-10-CM | POA: Diagnosis not present

## 2017-08-21 DIAGNOSIS — D472 Monoclonal gammopathy: Secondary | ICD-10-CM | POA: Diagnosis not present

## 2017-08-21 DIAGNOSIS — N2581 Secondary hyperparathyroidism of renal origin: Secondary | ICD-10-CM | POA: Diagnosis not present

## 2017-08-21 DIAGNOSIS — N186 End stage renal disease: Secondary | ICD-10-CM | POA: Diagnosis not present

## 2017-08-23 DIAGNOSIS — N186 End stage renal disease: Secondary | ICD-10-CM | POA: Diagnosis not present

## 2017-08-23 DIAGNOSIS — D631 Anemia in chronic kidney disease: Secondary | ICD-10-CM | POA: Diagnosis not present

## 2017-08-23 DIAGNOSIS — N2581 Secondary hyperparathyroidism of renal origin: Secondary | ICD-10-CM | POA: Diagnosis not present

## 2017-08-23 DIAGNOSIS — D509 Iron deficiency anemia, unspecified: Secondary | ICD-10-CM | POA: Diagnosis not present

## 2017-08-23 DIAGNOSIS — D472 Monoclonal gammopathy: Secondary | ICD-10-CM | POA: Diagnosis not present

## 2017-08-23 DIAGNOSIS — Z992 Dependence on renal dialysis: Secondary | ICD-10-CM | POA: Diagnosis not present

## 2017-08-26 DIAGNOSIS — N186 End stage renal disease: Secondary | ICD-10-CM | POA: Diagnosis not present

## 2017-08-26 DIAGNOSIS — D472 Monoclonal gammopathy: Secondary | ICD-10-CM | POA: Diagnosis not present

## 2017-08-26 DIAGNOSIS — N2581 Secondary hyperparathyroidism of renal origin: Secondary | ICD-10-CM | POA: Diagnosis not present

## 2017-08-26 DIAGNOSIS — D631 Anemia in chronic kidney disease: Secondary | ICD-10-CM | POA: Diagnosis not present

## 2017-08-26 DIAGNOSIS — D509 Iron deficiency anemia, unspecified: Secondary | ICD-10-CM | POA: Diagnosis not present

## 2017-08-26 DIAGNOSIS — Z992 Dependence on renal dialysis: Secondary | ICD-10-CM | POA: Diagnosis not present

## 2017-08-28 DIAGNOSIS — D509 Iron deficiency anemia, unspecified: Secondary | ICD-10-CM | POA: Diagnosis not present

## 2017-08-28 DIAGNOSIS — D472 Monoclonal gammopathy: Secondary | ICD-10-CM | POA: Diagnosis not present

## 2017-08-28 DIAGNOSIS — Z992 Dependence on renal dialysis: Secondary | ICD-10-CM | POA: Diagnosis not present

## 2017-08-28 DIAGNOSIS — D631 Anemia in chronic kidney disease: Secondary | ICD-10-CM | POA: Diagnosis not present

## 2017-08-28 DIAGNOSIS — N186 End stage renal disease: Secondary | ICD-10-CM | POA: Diagnosis not present

## 2017-08-28 DIAGNOSIS — N2581 Secondary hyperparathyroidism of renal origin: Secondary | ICD-10-CM | POA: Diagnosis not present

## 2017-08-30 DIAGNOSIS — N2581 Secondary hyperparathyroidism of renal origin: Secondary | ICD-10-CM | POA: Diagnosis not present

## 2017-08-30 DIAGNOSIS — D631 Anemia in chronic kidney disease: Secondary | ICD-10-CM | POA: Diagnosis not present

## 2017-08-30 DIAGNOSIS — D509 Iron deficiency anemia, unspecified: Secondary | ICD-10-CM | POA: Diagnosis not present

## 2017-08-30 DIAGNOSIS — N186 End stage renal disease: Secondary | ICD-10-CM | POA: Diagnosis not present

## 2017-08-30 DIAGNOSIS — D472 Monoclonal gammopathy: Secondary | ICD-10-CM | POA: Diagnosis not present

## 2017-08-30 DIAGNOSIS — Z992 Dependence on renal dialysis: Secondary | ICD-10-CM | POA: Diagnosis not present

## 2017-09-01 DIAGNOSIS — I129 Hypertensive chronic kidney disease with stage 1 through stage 4 chronic kidney disease, or unspecified chronic kidney disease: Secondary | ICD-10-CM | POA: Diagnosis not present

## 2017-09-01 DIAGNOSIS — N186 End stage renal disease: Secondary | ICD-10-CM | POA: Diagnosis not present

## 2017-09-01 DIAGNOSIS — Z992 Dependence on renal dialysis: Secondary | ICD-10-CM | POA: Diagnosis not present

## 2017-09-02 DIAGNOSIS — D509 Iron deficiency anemia, unspecified: Secondary | ICD-10-CM | POA: Diagnosis not present

## 2017-09-02 DIAGNOSIS — D472 Monoclonal gammopathy: Secondary | ICD-10-CM | POA: Diagnosis not present

## 2017-09-02 DIAGNOSIS — Z992 Dependence on renal dialysis: Secondary | ICD-10-CM | POA: Diagnosis not present

## 2017-09-02 DIAGNOSIS — N2581 Secondary hyperparathyroidism of renal origin: Secondary | ICD-10-CM | POA: Diagnosis not present

## 2017-09-02 DIAGNOSIS — D631 Anemia in chronic kidney disease: Secondary | ICD-10-CM | POA: Diagnosis not present

## 2017-09-02 DIAGNOSIS — N186 End stage renal disease: Secondary | ICD-10-CM | POA: Diagnosis not present

## 2017-09-04 DIAGNOSIS — D509 Iron deficiency anemia, unspecified: Secondary | ICD-10-CM | POA: Diagnosis not present

## 2017-09-04 DIAGNOSIS — D472 Monoclonal gammopathy: Secondary | ICD-10-CM | POA: Diagnosis not present

## 2017-09-04 DIAGNOSIS — N2581 Secondary hyperparathyroidism of renal origin: Secondary | ICD-10-CM | POA: Diagnosis not present

## 2017-09-04 DIAGNOSIS — D631 Anemia in chronic kidney disease: Secondary | ICD-10-CM | POA: Diagnosis not present

## 2017-09-04 DIAGNOSIS — Z992 Dependence on renal dialysis: Secondary | ICD-10-CM | POA: Diagnosis not present

## 2017-09-04 DIAGNOSIS — N186 End stage renal disease: Secondary | ICD-10-CM | POA: Diagnosis not present

## 2017-09-06 DIAGNOSIS — Z992 Dependence on renal dialysis: Secondary | ICD-10-CM | POA: Diagnosis not present

## 2017-09-06 DIAGNOSIS — D509 Iron deficiency anemia, unspecified: Secondary | ICD-10-CM | POA: Diagnosis not present

## 2017-09-06 DIAGNOSIS — D472 Monoclonal gammopathy: Secondary | ICD-10-CM | POA: Diagnosis not present

## 2017-09-06 DIAGNOSIS — N2581 Secondary hyperparathyroidism of renal origin: Secondary | ICD-10-CM | POA: Diagnosis not present

## 2017-09-06 DIAGNOSIS — D631 Anemia in chronic kidney disease: Secondary | ICD-10-CM | POA: Diagnosis not present

## 2017-09-06 DIAGNOSIS — N186 End stage renal disease: Secondary | ICD-10-CM | POA: Diagnosis not present

## 2017-09-09 DIAGNOSIS — N2581 Secondary hyperparathyroidism of renal origin: Secondary | ICD-10-CM | POA: Diagnosis not present

## 2017-09-09 DIAGNOSIS — N186 End stage renal disease: Secondary | ICD-10-CM | POA: Diagnosis not present

## 2017-09-09 DIAGNOSIS — D472 Monoclonal gammopathy: Secondary | ICD-10-CM | POA: Diagnosis not present

## 2017-09-09 DIAGNOSIS — D509 Iron deficiency anemia, unspecified: Secondary | ICD-10-CM | POA: Diagnosis not present

## 2017-09-09 DIAGNOSIS — Z992 Dependence on renal dialysis: Secondary | ICD-10-CM | POA: Diagnosis not present

## 2017-09-09 DIAGNOSIS — D631 Anemia in chronic kidney disease: Secondary | ICD-10-CM | POA: Diagnosis not present

## 2017-09-11 DIAGNOSIS — D631 Anemia in chronic kidney disease: Secondary | ICD-10-CM | POA: Diagnosis not present

## 2017-09-11 DIAGNOSIS — N2581 Secondary hyperparathyroidism of renal origin: Secondary | ICD-10-CM | POA: Diagnosis not present

## 2017-09-11 DIAGNOSIS — D472 Monoclonal gammopathy: Secondary | ICD-10-CM | POA: Diagnosis not present

## 2017-09-11 DIAGNOSIS — D509 Iron deficiency anemia, unspecified: Secondary | ICD-10-CM | POA: Diagnosis not present

## 2017-09-11 DIAGNOSIS — Z992 Dependence on renal dialysis: Secondary | ICD-10-CM | POA: Diagnosis not present

## 2017-09-11 DIAGNOSIS — N186 End stage renal disease: Secondary | ICD-10-CM | POA: Diagnosis not present

## 2017-09-13 DIAGNOSIS — N186 End stage renal disease: Secondary | ICD-10-CM | POA: Diagnosis not present

## 2017-09-13 DIAGNOSIS — D472 Monoclonal gammopathy: Secondary | ICD-10-CM | POA: Diagnosis not present

## 2017-09-13 DIAGNOSIS — D509 Iron deficiency anemia, unspecified: Secondary | ICD-10-CM | POA: Diagnosis not present

## 2017-09-13 DIAGNOSIS — D631 Anemia in chronic kidney disease: Secondary | ICD-10-CM | POA: Diagnosis not present

## 2017-09-13 DIAGNOSIS — Z992 Dependence on renal dialysis: Secondary | ICD-10-CM | POA: Diagnosis not present

## 2017-09-13 DIAGNOSIS — N2581 Secondary hyperparathyroidism of renal origin: Secondary | ICD-10-CM | POA: Diagnosis not present

## 2017-09-16 DIAGNOSIS — D631 Anemia in chronic kidney disease: Secondary | ICD-10-CM | POA: Diagnosis not present

## 2017-09-16 DIAGNOSIS — D472 Monoclonal gammopathy: Secondary | ICD-10-CM | POA: Diagnosis not present

## 2017-09-16 DIAGNOSIS — N186 End stage renal disease: Secondary | ICD-10-CM | POA: Diagnosis not present

## 2017-09-16 DIAGNOSIS — D509 Iron deficiency anemia, unspecified: Secondary | ICD-10-CM | POA: Diagnosis not present

## 2017-09-16 DIAGNOSIS — Z992 Dependence on renal dialysis: Secondary | ICD-10-CM | POA: Diagnosis not present

## 2017-09-16 DIAGNOSIS — N2581 Secondary hyperparathyroidism of renal origin: Secondary | ICD-10-CM | POA: Diagnosis not present

## 2017-09-18 DIAGNOSIS — D631 Anemia in chronic kidney disease: Secondary | ICD-10-CM | POA: Diagnosis not present

## 2017-09-18 DIAGNOSIS — N186 End stage renal disease: Secondary | ICD-10-CM | POA: Diagnosis not present

## 2017-09-18 DIAGNOSIS — Z992 Dependence on renal dialysis: Secondary | ICD-10-CM | POA: Diagnosis not present

## 2017-09-18 DIAGNOSIS — D509 Iron deficiency anemia, unspecified: Secondary | ICD-10-CM | POA: Diagnosis not present

## 2017-09-18 DIAGNOSIS — N2581 Secondary hyperparathyroidism of renal origin: Secondary | ICD-10-CM | POA: Diagnosis not present

## 2017-09-18 DIAGNOSIS — D472 Monoclonal gammopathy: Secondary | ICD-10-CM | POA: Diagnosis not present

## 2017-09-20 DIAGNOSIS — D631 Anemia in chronic kidney disease: Secondary | ICD-10-CM | POA: Diagnosis not present

## 2017-09-20 DIAGNOSIS — N186 End stage renal disease: Secondary | ICD-10-CM | POA: Diagnosis not present

## 2017-09-20 DIAGNOSIS — Z992 Dependence on renal dialysis: Secondary | ICD-10-CM | POA: Diagnosis not present

## 2017-09-20 DIAGNOSIS — D509 Iron deficiency anemia, unspecified: Secondary | ICD-10-CM | POA: Diagnosis not present

## 2017-09-20 DIAGNOSIS — D472 Monoclonal gammopathy: Secondary | ICD-10-CM | POA: Diagnosis not present

## 2017-09-20 DIAGNOSIS — N2581 Secondary hyperparathyroidism of renal origin: Secondary | ICD-10-CM | POA: Diagnosis not present

## 2017-09-23 DIAGNOSIS — D509 Iron deficiency anemia, unspecified: Secondary | ICD-10-CM | POA: Diagnosis not present

## 2017-09-23 DIAGNOSIS — Z992 Dependence on renal dialysis: Secondary | ICD-10-CM | POA: Diagnosis not present

## 2017-09-23 DIAGNOSIS — N186 End stage renal disease: Secondary | ICD-10-CM | POA: Diagnosis not present

## 2017-09-23 DIAGNOSIS — D631 Anemia in chronic kidney disease: Secondary | ICD-10-CM | POA: Diagnosis not present

## 2017-09-23 DIAGNOSIS — N2581 Secondary hyperparathyroidism of renal origin: Secondary | ICD-10-CM | POA: Diagnosis not present

## 2017-09-23 DIAGNOSIS — D472 Monoclonal gammopathy: Secondary | ICD-10-CM | POA: Diagnosis not present

## 2017-09-25 DIAGNOSIS — N186 End stage renal disease: Secondary | ICD-10-CM | POA: Diagnosis not present

## 2017-09-25 DIAGNOSIS — N2581 Secondary hyperparathyroidism of renal origin: Secondary | ICD-10-CM | POA: Diagnosis not present

## 2017-09-25 DIAGNOSIS — D472 Monoclonal gammopathy: Secondary | ICD-10-CM | POA: Diagnosis not present

## 2017-09-25 DIAGNOSIS — D509 Iron deficiency anemia, unspecified: Secondary | ICD-10-CM | POA: Diagnosis not present

## 2017-09-25 DIAGNOSIS — Z992 Dependence on renal dialysis: Secondary | ICD-10-CM | POA: Diagnosis not present

## 2017-09-25 DIAGNOSIS — D631 Anemia in chronic kidney disease: Secondary | ICD-10-CM | POA: Diagnosis not present

## 2017-09-27 DIAGNOSIS — D631 Anemia in chronic kidney disease: Secondary | ICD-10-CM | POA: Diagnosis not present

## 2017-09-27 DIAGNOSIS — Z992 Dependence on renal dialysis: Secondary | ICD-10-CM | POA: Diagnosis not present

## 2017-09-27 DIAGNOSIS — N186 End stage renal disease: Secondary | ICD-10-CM | POA: Diagnosis not present

## 2017-09-27 DIAGNOSIS — D509 Iron deficiency anemia, unspecified: Secondary | ICD-10-CM | POA: Diagnosis not present

## 2017-09-27 DIAGNOSIS — N2581 Secondary hyperparathyroidism of renal origin: Secondary | ICD-10-CM | POA: Diagnosis not present

## 2017-09-27 DIAGNOSIS — D472 Monoclonal gammopathy: Secondary | ICD-10-CM | POA: Diagnosis not present

## 2017-09-30 DIAGNOSIS — D631 Anemia in chronic kidney disease: Secondary | ICD-10-CM | POA: Diagnosis not present

## 2017-09-30 DIAGNOSIS — N2581 Secondary hyperparathyroidism of renal origin: Secondary | ICD-10-CM | POA: Diagnosis not present

## 2017-09-30 DIAGNOSIS — N186 End stage renal disease: Secondary | ICD-10-CM | POA: Diagnosis not present

## 2017-09-30 DIAGNOSIS — D472 Monoclonal gammopathy: Secondary | ICD-10-CM | POA: Diagnosis not present

## 2017-09-30 DIAGNOSIS — D509 Iron deficiency anemia, unspecified: Secondary | ICD-10-CM | POA: Diagnosis not present

## 2017-09-30 DIAGNOSIS — Z992 Dependence on renal dialysis: Secondary | ICD-10-CM | POA: Diagnosis not present

## 2017-10-02 DIAGNOSIS — D509 Iron deficiency anemia, unspecified: Secondary | ICD-10-CM | POA: Diagnosis not present

## 2017-10-02 DIAGNOSIS — Z992 Dependence on renal dialysis: Secondary | ICD-10-CM | POA: Diagnosis not present

## 2017-10-02 DIAGNOSIS — D631 Anemia in chronic kidney disease: Secondary | ICD-10-CM | POA: Diagnosis not present

## 2017-10-02 DIAGNOSIS — D472 Monoclonal gammopathy: Secondary | ICD-10-CM | POA: Diagnosis not present

## 2017-10-02 DIAGNOSIS — N186 End stage renal disease: Secondary | ICD-10-CM | POA: Diagnosis not present

## 2017-10-02 DIAGNOSIS — I129 Hypertensive chronic kidney disease with stage 1 through stage 4 chronic kidney disease, or unspecified chronic kidney disease: Secondary | ICD-10-CM | POA: Diagnosis not present

## 2017-10-02 DIAGNOSIS — N2581 Secondary hyperparathyroidism of renal origin: Secondary | ICD-10-CM | POA: Diagnosis not present

## 2017-10-04 DIAGNOSIS — D509 Iron deficiency anemia, unspecified: Secondary | ICD-10-CM | POA: Diagnosis not present

## 2017-10-04 DIAGNOSIS — Z992 Dependence on renal dialysis: Secondary | ICD-10-CM | POA: Diagnosis not present

## 2017-10-04 DIAGNOSIS — N186 End stage renal disease: Secondary | ICD-10-CM | POA: Diagnosis not present

## 2017-10-04 DIAGNOSIS — N2581 Secondary hyperparathyroidism of renal origin: Secondary | ICD-10-CM | POA: Diagnosis not present

## 2017-10-04 DIAGNOSIS — D631 Anemia in chronic kidney disease: Secondary | ICD-10-CM | POA: Diagnosis not present

## 2017-10-04 DIAGNOSIS — D472 Monoclonal gammopathy: Secondary | ICD-10-CM | POA: Diagnosis not present

## 2017-10-07 DIAGNOSIS — D509 Iron deficiency anemia, unspecified: Secondary | ICD-10-CM | POA: Diagnosis not present

## 2017-10-07 DIAGNOSIS — N186 End stage renal disease: Secondary | ICD-10-CM | POA: Diagnosis not present

## 2017-10-07 DIAGNOSIS — Z992 Dependence on renal dialysis: Secondary | ICD-10-CM | POA: Diagnosis not present

## 2017-10-07 DIAGNOSIS — N2581 Secondary hyperparathyroidism of renal origin: Secondary | ICD-10-CM | POA: Diagnosis not present

## 2017-10-07 DIAGNOSIS — D472 Monoclonal gammopathy: Secondary | ICD-10-CM | POA: Diagnosis not present

## 2017-10-07 DIAGNOSIS — D631 Anemia in chronic kidney disease: Secondary | ICD-10-CM | POA: Diagnosis not present

## 2017-10-09 DIAGNOSIS — N186 End stage renal disease: Secondary | ICD-10-CM | POA: Diagnosis not present

## 2017-10-09 DIAGNOSIS — N2581 Secondary hyperparathyroidism of renal origin: Secondary | ICD-10-CM | POA: Diagnosis not present

## 2017-10-09 DIAGNOSIS — D631 Anemia in chronic kidney disease: Secondary | ICD-10-CM | POA: Diagnosis not present

## 2017-10-09 DIAGNOSIS — Z992 Dependence on renal dialysis: Secondary | ICD-10-CM | POA: Diagnosis not present

## 2017-10-09 DIAGNOSIS — D509 Iron deficiency anemia, unspecified: Secondary | ICD-10-CM | POA: Diagnosis not present

## 2017-10-09 DIAGNOSIS — D472 Monoclonal gammopathy: Secondary | ICD-10-CM | POA: Diagnosis not present

## 2017-10-11 DIAGNOSIS — D472 Monoclonal gammopathy: Secondary | ICD-10-CM | POA: Diagnosis not present

## 2017-10-11 DIAGNOSIS — D631 Anemia in chronic kidney disease: Secondary | ICD-10-CM | POA: Diagnosis not present

## 2017-10-11 DIAGNOSIS — N2581 Secondary hyperparathyroidism of renal origin: Secondary | ICD-10-CM | POA: Diagnosis not present

## 2017-10-11 DIAGNOSIS — D509 Iron deficiency anemia, unspecified: Secondary | ICD-10-CM | POA: Diagnosis not present

## 2017-10-11 DIAGNOSIS — N186 End stage renal disease: Secondary | ICD-10-CM | POA: Diagnosis not present

## 2017-10-11 DIAGNOSIS — Z992 Dependence on renal dialysis: Secondary | ICD-10-CM | POA: Diagnosis not present

## 2017-10-14 DIAGNOSIS — D472 Monoclonal gammopathy: Secondary | ICD-10-CM | POA: Diagnosis not present

## 2017-10-14 DIAGNOSIS — N2581 Secondary hyperparathyroidism of renal origin: Secondary | ICD-10-CM | POA: Diagnosis not present

## 2017-10-14 DIAGNOSIS — D509 Iron deficiency anemia, unspecified: Secondary | ICD-10-CM | POA: Diagnosis not present

## 2017-10-14 DIAGNOSIS — D631 Anemia in chronic kidney disease: Secondary | ICD-10-CM | POA: Diagnosis not present

## 2017-10-14 DIAGNOSIS — Z992 Dependence on renal dialysis: Secondary | ICD-10-CM | POA: Diagnosis not present

## 2017-10-14 DIAGNOSIS — N186 End stage renal disease: Secondary | ICD-10-CM | POA: Diagnosis not present

## 2017-10-16 DIAGNOSIS — Z992 Dependence on renal dialysis: Secondary | ICD-10-CM | POA: Diagnosis not present

## 2017-10-16 DIAGNOSIS — D509 Iron deficiency anemia, unspecified: Secondary | ICD-10-CM | POA: Diagnosis not present

## 2017-10-16 DIAGNOSIS — D472 Monoclonal gammopathy: Secondary | ICD-10-CM | POA: Diagnosis not present

## 2017-10-16 DIAGNOSIS — N186 End stage renal disease: Secondary | ICD-10-CM | POA: Diagnosis not present

## 2017-10-16 DIAGNOSIS — N2581 Secondary hyperparathyroidism of renal origin: Secondary | ICD-10-CM | POA: Diagnosis not present

## 2017-10-16 DIAGNOSIS — D631 Anemia in chronic kidney disease: Secondary | ICD-10-CM | POA: Diagnosis not present

## 2017-10-18 DIAGNOSIS — D631 Anemia in chronic kidney disease: Secondary | ICD-10-CM | POA: Diagnosis not present

## 2017-10-18 DIAGNOSIS — D472 Monoclonal gammopathy: Secondary | ICD-10-CM | POA: Diagnosis not present

## 2017-10-18 DIAGNOSIS — D509 Iron deficiency anemia, unspecified: Secondary | ICD-10-CM | POA: Diagnosis not present

## 2017-10-18 DIAGNOSIS — N186 End stage renal disease: Secondary | ICD-10-CM | POA: Diagnosis not present

## 2017-10-18 DIAGNOSIS — N2581 Secondary hyperparathyroidism of renal origin: Secondary | ICD-10-CM | POA: Diagnosis not present

## 2017-10-18 DIAGNOSIS — Z992 Dependence on renal dialysis: Secondary | ICD-10-CM | POA: Diagnosis not present

## 2017-10-20 DIAGNOSIS — N2581 Secondary hyperparathyroidism of renal origin: Secondary | ICD-10-CM | POA: Diagnosis not present

## 2017-10-20 DIAGNOSIS — N186 End stage renal disease: Secondary | ICD-10-CM | POA: Diagnosis not present

## 2017-10-20 DIAGNOSIS — Z992 Dependence on renal dialysis: Secondary | ICD-10-CM | POA: Diagnosis not present

## 2017-10-20 DIAGNOSIS — D631 Anemia in chronic kidney disease: Secondary | ICD-10-CM | POA: Diagnosis not present

## 2017-10-20 DIAGNOSIS — D509 Iron deficiency anemia, unspecified: Secondary | ICD-10-CM | POA: Diagnosis not present

## 2017-10-20 DIAGNOSIS — D472 Monoclonal gammopathy: Secondary | ICD-10-CM | POA: Diagnosis not present

## 2017-10-22 DIAGNOSIS — D472 Monoclonal gammopathy: Secondary | ICD-10-CM | POA: Diagnosis not present

## 2017-10-22 DIAGNOSIS — D631 Anemia in chronic kidney disease: Secondary | ICD-10-CM | POA: Diagnosis not present

## 2017-10-22 DIAGNOSIS — Z992 Dependence on renal dialysis: Secondary | ICD-10-CM | POA: Diagnosis not present

## 2017-10-22 DIAGNOSIS — D509 Iron deficiency anemia, unspecified: Secondary | ICD-10-CM | POA: Diagnosis not present

## 2017-10-22 DIAGNOSIS — N2581 Secondary hyperparathyroidism of renal origin: Secondary | ICD-10-CM | POA: Diagnosis not present

## 2017-10-22 DIAGNOSIS — N186 End stage renal disease: Secondary | ICD-10-CM | POA: Diagnosis not present

## 2017-10-25 DIAGNOSIS — D509 Iron deficiency anemia, unspecified: Secondary | ICD-10-CM | POA: Diagnosis not present

## 2017-10-25 DIAGNOSIS — N2581 Secondary hyperparathyroidism of renal origin: Secondary | ICD-10-CM | POA: Diagnosis not present

## 2017-10-25 DIAGNOSIS — D631 Anemia in chronic kidney disease: Secondary | ICD-10-CM | POA: Diagnosis not present

## 2017-10-25 DIAGNOSIS — N186 End stage renal disease: Secondary | ICD-10-CM | POA: Diagnosis not present

## 2017-10-25 DIAGNOSIS — Z992 Dependence on renal dialysis: Secondary | ICD-10-CM | POA: Diagnosis not present

## 2017-10-25 DIAGNOSIS — D472 Monoclonal gammopathy: Secondary | ICD-10-CM | POA: Diagnosis not present

## 2017-10-28 DIAGNOSIS — N2581 Secondary hyperparathyroidism of renal origin: Secondary | ICD-10-CM | POA: Diagnosis not present

## 2017-10-28 DIAGNOSIS — D631 Anemia in chronic kidney disease: Secondary | ICD-10-CM | POA: Diagnosis not present

## 2017-10-28 DIAGNOSIS — N186 End stage renal disease: Secondary | ICD-10-CM | POA: Diagnosis not present

## 2017-10-28 DIAGNOSIS — D509 Iron deficiency anemia, unspecified: Secondary | ICD-10-CM | POA: Diagnosis not present

## 2017-10-28 DIAGNOSIS — D472 Monoclonal gammopathy: Secondary | ICD-10-CM | POA: Diagnosis not present

## 2017-10-28 DIAGNOSIS — Z992 Dependence on renal dialysis: Secondary | ICD-10-CM | POA: Diagnosis not present

## 2017-10-30 DIAGNOSIS — D472 Monoclonal gammopathy: Secondary | ICD-10-CM | POA: Diagnosis not present

## 2017-10-30 DIAGNOSIS — Z992 Dependence on renal dialysis: Secondary | ICD-10-CM | POA: Diagnosis not present

## 2017-10-30 DIAGNOSIS — D631 Anemia in chronic kidney disease: Secondary | ICD-10-CM | POA: Diagnosis not present

## 2017-10-30 DIAGNOSIS — N2581 Secondary hyperparathyroidism of renal origin: Secondary | ICD-10-CM | POA: Diagnosis not present

## 2017-10-30 DIAGNOSIS — N186 End stage renal disease: Secondary | ICD-10-CM | POA: Diagnosis not present

## 2017-10-30 DIAGNOSIS — D509 Iron deficiency anemia, unspecified: Secondary | ICD-10-CM | POA: Diagnosis not present

## 2017-11-01 DIAGNOSIS — N186 End stage renal disease: Secondary | ICD-10-CM | POA: Diagnosis not present

## 2017-11-01 DIAGNOSIS — Z992 Dependence on renal dialysis: Secondary | ICD-10-CM | POA: Diagnosis not present

## 2017-11-01 DIAGNOSIS — D631 Anemia in chronic kidney disease: Secondary | ICD-10-CM | POA: Diagnosis not present

## 2017-11-01 DIAGNOSIS — N2581 Secondary hyperparathyroidism of renal origin: Secondary | ICD-10-CM | POA: Diagnosis not present

## 2017-11-01 DIAGNOSIS — D509 Iron deficiency anemia, unspecified: Secondary | ICD-10-CM | POA: Diagnosis not present

## 2017-11-01 DIAGNOSIS — D472 Monoclonal gammopathy: Secondary | ICD-10-CM | POA: Diagnosis not present

## 2017-11-01 DIAGNOSIS — I129 Hypertensive chronic kidney disease with stage 1 through stage 4 chronic kidney disease, or unspecified chronic kidney disease: Secondary | ICD-10-CM | POA: Diagnosis not present

## 2017-11-04 DIAGNOSIS — N2581 Secondary hyperparathyroidism of renal origin: Secondary | ICD-10-CM | POA: Diagnosis not present

## 2017-11-04 DIAGNOSIS — D472 Monoclonal gammopathy: Secondary | ICD-10-CM | POA: Diagnosis not present

## 2017-11-04 DIAGNOSIS — N186 End stage renal disease: Secondary | ICD-10-CM | POA: Diagnosis not present

## 2017-11-04 DIAGNOSIS — Z992 Dependence on renal dialysis: Secondary | ICD-10-CM | POA: Diagnosis not present

## 2017-11-04 DIAGNOSIS — D631 Anemia in chronic kidney disease: Secondary | ICD-10-CM | POA: Diagnosis not present

## 2017-11-06 DIAGNOSIS — D631 Anemia in chronic kidney disease: Secondary | ICD-10-CM | POA: Diagnosis not present

## 2017-11-06 DIAGNOSIS — D472 Monoclonal gammopathy: Secondary | ICD-10-CM | POA: Diagnosis not present

## 2017-11-06 DIAGNOSIS — N186 End stage renal disease: Secondary | ICD-10-CM | POA: Diagnosis not present

## 2017-11-06 DIAGNOSIS — N2581 Secondary hyperparathyroidism of renal origin: Secondary | ICD-10-CM | POA: Diagnosis not present

## 2017-11-06 DIAGNOSIS — Z992 Dependence on renal dialysis: Secondary | ICD-10-CM | POA: Diagnosis not present

## 2017-11-08 DIAGNOSIS — N186 End stage renal disease: Secondary | ICD-10-CM | POA: Diagnosis not present

## 2017-11-08 DIAGNOSIS — N2581 Secondary hyperparathyroidism of renal origin: Secondary | ICD-10-CM | POA: Diagnosis not present

## 2017-11-08 DIAGNOSIS — D631 Anemia in chronic kidney disease: Secondary | ICD-10-CM | POA: Diagnosis not present

## 2017-11-08 DIAGNOSIS — D472 Monoclonal gammopathy: Secondary | ICD-10-CM | POA: Diagnosis not present

## 2017-11-08 DIAGNOSIS — Z992 Dependence on renal dialysis: Secondary | ICD-10-CM | POA: Diagnosis not present

## 2017-11-12 DIAGNOSIS — N186 End stage renal disease: Secondary | ICD-10-CM | POA: Diagnosis not present

## 2017-11-12 DIAGNOSIS — Z992 Dependence on renal dialysis: Secondary | ICD-10-CM | POA: Diagnosis not present

## 2017-11-12 DIAGNOSIS — N2581 Secondary hyperparathyroidism of renal origin: Secondary | ICD-10-CM | POA: Diagnosis not present

## 2017-11-12 DIAGNOSIS — D631 Anemia in chronic kidney disease: Secondary | ICD-10-CM | POA: Diagnosis not present

## 2017-11-12 DIAGNOSIS — D472 Monoclonal gammopathy: Secondary | ICD-10-CM | POA: Diagnosis not present

## 2017-11-15 DIAGNOSIS — D472 Monoclonal gammopathy: Secondary | ICD-10-CM | POA: Diagnosis not present

## 2017-11-15 DIAGNOSIS — D631 Anemia in chronic kidney disease: Secondary | ICD-10-CM | POA: Diagnosis not present

## 2017-11-15 DIAGNOSIS — N186 End stage renal disease: Secondary | ICD-10-CM | POA: Diagnosis not present

## 2017-11-15 DIAGNOSIS — Z992 Dependence on renal dialysis: Secondary | ICD-10-CM | POA: Diagnosis not present

## 2017-11-15 DIAGNOSIS — N2581 Secondary hyperparathyroidism of renal origin: Secondary | ICD-10-CM | POA: Diagnosis not present

## 2017-11-18 DIAGNOSIS — Z992 Dependence on renal dialysis: Secondary | ICD-10-CM | POA: Diagnosis not present

## 2017-11-18 DIAGNOSIS — D631 Anemia in chronic kidney disease: Secondary | ICD-10-CM | POA: Diagnosis not present

## 2017-11-18 DIAGNOSIS — N2581 Secondary hyperparathyroidism of renal origin: Secondary | ICD-10-CM | POA: Diagnosis not present

## 2017-11-18 DIAGNOSIS — D472 Monoclonal gammopathy: Secondary | ICD-10-CM | POA: Diagnosis not present

## 2017-11-18 DIAGNOSIS — N186 End stage renal disease: Secondary | ICD-10-CM | POA: Diagnosis not present

## 2017-11-20 DIAGNOSIS — D631 Anemia in chronic kidney disease: Secondary | ICD-10-CM | POA: Diagnosis not present

## 2017-11-20 DIAGNOSIS — N186 End stage renal disease: Secondary | ICD-10-CM | POA: Diagnosis not present

## 2017-11-20 DIAGNOSIS — Z992 Dependence on renal dialysis: Secondary | ICD-10-CM | POA: Diagnosis not present

## 2017-11-20 DIAGNOSIS — N2581 Secondary hyperparathyroidism of renal origin: Secondary | ICD-10-CM | POA: Diagnosis not present

## 2017-11-20 DIAGNOSIS — D472 Monoclonal gammopathy: Secondary | ICD-10-CM | POA: Diagnosis not present

## 2017-11-22 DIAGNOSIS — D631 Anemia in chronic kidney disease: Secondary | ICD-10-CM | POA: Diagnosis not present

## 2017-11-22 DIAGNOSIS — Z992 Dependence on renal dialysis: Secondary | ICD-10-CM | POA: Diagnosis not present

## 2017-11-22 DIAGNOSIS — N186 End stage renal disease: Secondary | ICD-10-CM | POA: Diagnosis not present

## 2017-11-22 DIAGNOSIS — D472 Monoclonal gammopathy: Secondary | ICD-10-CM | POA: Diagnosis not present

## 2017-11-22 DIAGNOSIS — N2581 Secondary hyperparathyroidism of renal origin: Secondary | ICD-10-CM | POA: Diagnosis not present

## 2017-11-24 DIAGNOSIS — N186 End stage renal disease: Secondary | ICD-10-CM | POA: Diagnosis not present

## 2017-11-24 DIAGNOSIS — Z992 Dependence on renal dialysis: Secondary | ICD-10-CM | POA: Diagnosis not present

## 2017-11-24 DIAGNOSIS — D631 Anemia in chronic kidney disease: Secondary | ICD-10-CM | POA: Diagnosis not present

## 2017-11-24 DIAGNOSIS — N2581 Secondary hyperparathyroidism of renal origin: Secondary | ICD-10-CM | POA: Diagnosis not present

## 2017-11-24 DIAGNOSIS — D472 Monoclonal gammopathy: Secondary | ICD-10-CM | POA: Diagnosis not present

## 2017-11-27 DIAGNOSIS — D472 Monoclonal gammopathy: Secondary | ICD-10-CM | POA: Diagnosis not present

## 2017-11-27 DIAGNOSIS — N2581 Secondary hyperparathyroidism of renal origin: Secondary | ICD-10-CM | POA: Diagnosis not present

## 2017-11-27 DIAGNOSIS — D631 Anemia in chronic kidney disease: Secondary | ICD-10-CM | POA: Diagnosis not present

## 2017-11-27 DIAGNOSIS — Z992 Dependence on renal dialysis: Secondary | ICD-10-CM | POA: Diagnosis not present

## 2017-11-27 DIAGNOSIS — N186 End stage renal disease: Secondary | ICD-10-CM | POA: Diagnosis not present

## 2017-11-29 DIAGNOSIS — D472 Monoclonal gammopathy: Secondary | ICD-10-CM | POA: Diagnosis not present

## 2017-11-29 DIAGNOSIS — N2581 Secondary hyperparathyroidism of renal origin: Secondary | ICD-10-CM | POA: Diagnosis not present

## 2017-11-29 DIAGNOSIS — N186 End stage renal disease: Secondary | ICD-10-CM | POA: Diagnosis not present

## 2017-11-29 DIAGNOSIS — D631 Anemia in chronic kidney disease: Secondary | ICD-10-CM | POA: Diagnosis not present

## 2017-11-29 DIAGNOSIS — Z992 Dependence on renal dialysis: Secondary | ICD-10-CM | POA: Diagnosis not present

## 2017-12-01 DIAGNOSIS — Z992 Dependence on renal dialysis: Secondary | ICD-10-CM | POA: Diagnosis not present

## 2017-12-01 DIAGNOSIS — N186 End stage renal disease: Secondary | ICD-10-CM | POA: Diagnosis not present

## 2017-12-01 DIAGNOSIS — D472 Monoclonal gammopathy: Secondary | ICD-10-CM | POA: Diagnosis not present

## 2017-12-01 DIAGNOSIS — N2581 Secondary hyperparathyroidism of renal origin: Secondary | ICD-10-CM | POA: Diagnosis not present

## 2017-12-01 DIAGNOSIS — D631 Anemia in chronic kidney disease: Secondary | ICD-10-CM | POA: Diagnosis not present

## 2017-12-02 DIAGNOSIS — Z992 Dependence on renal dialysis: Secondary | ICD-10-CM | POA: Diagnosis not present

## 2017-12-02 DIAGNOSIS — I129 Hypertensive chronic kidney disease with stage 1 through stage 4 chronic kidney disease, or unspecified chronic kidney disease: Secondary | ICD-10-CM | POA: Diagnosis not present

## 2017-12-02 DIAGNOSIS — N186 End stage renal disease: Secondary | ICD-10-CM | POA: Diagnosis not present

## 2017-12-04 DIAGNOSIS — Z992 Dependence on renal dialysis: Secondary | ICD-10-CM | POA: Diagnosis not present

## 2017-12-04 DIAGNOSIS — D472 Monoclonal gammopathy: Secondary | ICD-10-CM | POA: Diagnosis not present

## 2017-12-04 DIAGNOSIS — D631 Anemia in chronic kidney disease: Secondary | ICD-10-CM | POA: Diagnosis not present

## 2017-12-04 DIAGNOSIS — N186 End stage renal disease: Secondary | ICD-10-CM | POA: Diagnosis not present

## 2017-12-04 DIAGNOSIS — N2581 Secondary hyperparathyroidism of renal origin: Secondary | ICD-10-CM | POA: Diagnosis not present

## 2017-12-06 DIAGNOSIS — N186 End stage renal disease: Secondary | ICD-10-CM | POA: Diagnosis not present

## 2017-12-06 DIAGNOSIS — N2581 Secondary hyperparathyroidism of renal origin: Secondary | ICD-10-CM | POA: Diagnosis not present

## 2017-12-06 DIAGNOSIS — D631 Anemia in chronic kidney disease: Secondary | ICD-10-CM | POA: Diagnosis not present

## 2017-12-06 DIAGNOSIS — Z992 Dependence on renal dialysis: Secondary | ICD-10-CM | POA: Diagnosis not present

## 2017-12-06 DIAGNOSIS — D472 Monoclonal gammopathy: Secondary | ICD-10-CM | POA: Diagnosis not present

## 2017-12-09 DIAGNOSIS — N2581 Secondary hyperparathyroidism of renal origin: Secondary | ICD-10-CM | POA: Diagnosis not present

## 2017-12-09 DIAGNOSIS — N186 End stage renal disease: Secondary | ICD-10-CM | POA: Diagnosis not present

## 2017-12-09 DIAGNOSIS — Z992 Dependence on renal dialysis: Secondary | ICD-10-CM | POA: Diagnosis not present

## 2017-12-09 DIAGNOSIS — D631 Anemia in chronic kidney disease: Secondary | ICD-10-CM | POA: Diagnosis not present

## 2017-12-09 DIAGNOSIS — D472 Monoclonal gammopathy: Secondary | ICD-10-CM | POA: Diagnosis not present

## 2017-12-11 DIAGNOSIS — D472 Monoclonal gammopathy: Secondary | ICD-10-CM | POA: Diagnosis not present

## 2017-12-11 DIAGNOSIS — N186 End stage renal disease: Secondary | ICD-10-CM | POA: Diagnosis not present

## 2017-12-11 DIAGNOSIS — Z992 Dependence on renal dialysis: Secondary | ICD-10-CM | POA: Diagnosis not present

## 2017-12-11 DIAGNOSIS — D631 Anemia in chronic kidney disease: Secondary | ICD-10-CM | POA: Diagnosis not present

## 2017-12-11 DIAGNOSIS — N2581 Secondary hyperparathyroidism of renal origin: Secondary | ICD-10-CM | POA: Diagnosis not present

## 2017-12-13 DIAGNOSIS — N186 End stage renal disease: Secondary | ICD-10-CM | POA: Diagnosis not present

## 2017-12-13 DIAGNOSIS — N2581 Secondary hyperparathyroidism of renal origin: Secondary | ICD-10-CM | POA: Diagnosis not present

## 2017-12-13 DIAGNOSIS — D472 Monoclonal gammopathy: Secondary | ICD-10-CM | POA: Diagnosis not present

## 2017-12-13 DIAGNOSIS — D631 Anemia in chronic kidney disease: Secondary | ICD-10-CM | POA: Diagnosis not present

## 2017-12-13 DIAGNOSIS — Z992 Dependence on renal dialysis: Secondary | ICD-10-CM | POA: Diagnosis not present

## 2017-12-16 DIAGNOSIS — N186 End stage renal disease: Secondary | ICD-10-CM | POA: Diagnosis not present

## 2017-12-16 DIAGNOSIS — Z992 Dependence on renal dialysis: Secondary | ICD-10-CM | POA: Diagnosis not present

## 2017-12-16 DIAGNOSIS — D472 Monoclonal gammopathy: Secondary | ICD-10-CM | POA: Diagnosis not present

## 2017-12-16 DIAGNOSIS — D631 Anemia in chronic kidney disease: Secondary | ICD-10-CM | POA: Diagnosis not present

## 2017-12-16 DIAGNOSIS — N2581 Secondary hyperparathyroidism of renal origin: Secondary | ICD-10-CM | POA: Diagnosis not present

## 2017-12-18 DIAGNOSIS — N186 End stage renal disease: Secondary | ICD-10-CM | POA: Diagnosis not present

## 2017-12-18 DIAGNOSIS — D631 Anemia in chronic kidney disease: Secondary | ICD-10-CM | POA: Diagnosis not present

## 2017-12-18 DIAGNOSIS — D472 Monoclonal gammopathy: Secondary | ICD-10-CM | POA: Diagnosis not present

## 2017-12-18 DIAGNOSIS — N2581 Secondary hyperparathyroidism of renal origin: Secondary | ICD-10-CM | POA: Diagnosis not present

## 2017-12-18 DIAGNOSIS — Z992 Dependence on renal dialysis: Secondary | ICD-10-CM | POA: Diagnosis not present

## 2017-12-20 DIAGNOSIS — N186 End stage renal disease: Secondary | ICD-10-CM | POA: Diagnosis not present

## 2017-12-20 DIAGNOSIS — D472 Monoclonal gammopathy: Secondary | ICD-10-CM | POA: Diagnosis not present

## 2017-12-20 DIAGNOSIS — D631 Anemia in chronic kidney disease: Secondary | ICD-10-CM | POA: Diagnosis not present

## 2017-12-20 DIAGNOSIS — Z992 Dependence on renal dialysis: Secondary | ICD-10-CM | POA: Diagnosis not present

## 2017-12-20 DIAGNOSIS — N2581 Secondary hyperparathyroidism of renal origin: Secondary | ICD-10-CM | POA: Diagnosis not present

## 2017-12-23 DIAGNOSIS — D472 Monoclonal gammopathy: Secondary | ICD-10-CM | POA: Diagnosis not present

## 2017-12-23 DIAGNOSIS — D631 Anemia in chronic kidney disease: Secondary | ICD-10-CM | POA: Diagnosis not present

## 2017-12-23 DIAGNOSIS — N2581 Secondary hyperparathyroidism of renal origin: Secondary | ICD-10-CM | POA: Diagnosis not present

## 2017-12-23 DIAGNOSIS — Z992 Dependence on renal dialysis: Secondary | ICD-10-CM | POA: Diagnosis not present

## 2017-12-23 DIAGNOSIS — N186 End stage renal disease: Secondary | ICD-10-CM | POA: Diagnosis not present

## 2017-12-25 DIAGNOSIS — N186 End stage renal disease: Secondary | ICD-10-CM | POA: Diagnosis not present

## 2017-12-25 DIAGNOSIS — N2581 Secondary hyperparathyroidism of renal origin: Secondary | ICD-10-CM | POA: Diagnosis not present

## 2017-12-25 DIAGNOSIS — D472 Monoclonal gammopathy: Secondary | ICD-10-CM | POA: Diagnosis not present

## 2017-12-25 DIAGNOSIS — Z992 Dependence on renal dialysis: Secondary | ICD-10-CM | POA: Diagnosis not present

## 2017-12-25 DIAGNOSIS — D631 Anemia in chronic kidney disease: Secondary | ICD-10-CM | POA: Diagnosis not present

## 2017-12-27 DIAGNOSIS — N186 End stage renal disease: Secondary | ICD-10-CM | POA: Diagnosis not present

## 2017-12-27 DIAGNOSIS — Z992 Dependence on renal dialysis: Secondary | ICD-10-CM | POA: Diagnosis not present

## 2017-12-27 DIAGNOSIS — N2581 Secondary hyperparathyroidism of renal origin: Secondary | ICD-10-CM | POA: Diagnosis not present

## 2017-12-27 DIAGNOSIS — D631 Anemia in chronic kidney disease: Secondary | ICD-10-CM | POA: Diagnosis not present

## 2017-12-27 DIAGNOSIS — D472 Monoclonal gammopathy: Secondary | ICD-10-CM | POA: Diagnosis not present

## 2017-12-30 DIAGNOSIS — D472 Monoclonal gammopathy: Secondary | ICD-10-CM | POA: Diagnosis not present

## 2017-12-30 DIAGNOSIS — N2581 Secondary hyperparathyroidism of renal origin: Secondary | ICD-10-CM | POA: Diagnosis not present

## 2017-12-30 DIAGNOSIS — N186 End stage renal disease: Secondary | ICD-10-CM | POA: Diagnosis not present

## 2017-12-30 DIAGNOSIS — D631 Anemia in chronic kidney disease: Secondary | ICD-10-CM | POA: Diagnosis not present

## 2017-12-30 DIAGNOSIS — Z992 Dependence on renal dialysis: Secondary | ICD-10-CM | POA: Diagnosis not present

## 2018-01-01 DIAGNOSIS — Z992 Dependence on renal dialysis: Secondary | ICD-10-CM | POA: Diagnosis not present

## 2018-01-01 DIAGNOSIS — D631 Anemia in chronic kidney disease: Secondary | ICD-10-CM | POA: Diagnosis not present

## 2018-01-01 DIAGNOSIS — N186 End stage renal disease: Secondary | ICD-10-CM | POA: Diagnosis not present

## 2018-01-01 DIAGNOSIS — N2581 Secondary hyperparathyroidism of renal origin: Secondary | ICD-10-CM | POA: Diagnosis not present

## 2018-01-01 DIAGNOSIS — D472 Monoclonal gammopathy: Secondary | ICD-10-CM | POA: Diagnosis not present

## 2018-01-02 DIAGNOSIS — N186 End stage renal disease: Secondary | ICD-10-CM | POA: Diagnosis not present

## 2018-01-02 DIAGNOSIS — Z992 Dependence on renal dialysis: Secondary | ICD-10-CM | POA: Diagnosis not present

## 2018-01-02 DIAGNOSIS — I129 Hypertensive chronic kidney disease with stage 1 through stage 4 chronic kidney disease, or unspecified chronic kidney disease: Secondary | ICD-10-CM | POA: Diagnosis not present

## 2018-01-03 DIAGNOSIS — D631 Anemia in chronic kidney disease: Secondary | ICD-10-CM | POA: Diagnosis not present

## 2018-01-03 DIAGNOSIS — D472 Monoclonal gammopathy: Secondary | ICD-10-CM | POA: Diagnosis not present

## 2018-01-03 DIAGNOSIS — N2581 Secondary hyperparathyroidism of renal origin: Secondary | ICD-10-CM | POA: Diagnosis not present

## 2018-01-03 DIAGNOSIS — I129 Hypertensive chronic kidney disease with stage 1 through stage 4 chronic kidney disease, or unspecified chronic kidney disease: Secondary | ICD-10-CM | POA: Diagnosis not present

## 2018-01-03 DIAGNOSIS — N186 End stage renal disease: Secondary | ICD-10-CM | POA: Diagnosis not present

## 2018-01-03 DIAGNOSIS — Z992 Dependence on renal dialysis: Secondary | ICD-10-CM | POA: Diagnosis not present

## 2018-01-06 DIAGNOSIS — N2581 Secondary hyperparathyroidism of renal origin: Secondary | ICD-10-CM | POA: Diagnosis not present

## 2018-01-06 DIAGNOSIS — Z992 Dependence on renal dialysis: Secondary | ICD-10-CM | POA: Diagnosis not present

## 2018-01-06 DIAGNOSIS — N186 End stage renal disease: Secondary | ICD-10-CM | POA: Diagnosis not present

## 2018-01-06 DIAGNOSIS — D631 Anemia in chronic kidney disease: Secondary | ICD-10-CM | POA: Diagnosis not present

## 2018-01-06 DIAGNOSIS — D472 Monoclonal gammopathy: Secondary | ICD-10-CM | POA: Diagnosis not present

## 2018-01-08 DIAGNOSIS — N2581 Secondary hyperparathyroidism of renal origin: Secondary | ICD-10-CM | POA: Diagnosis not present

## 2018-01-08 DIAGNOSIS — Z992 Dependence on renal dialysis: Secondary | ICD-10-CM | POA: Diagnosis not present

## 2018-01-08 DIAGNOSIS — D631 Anemia in chronic kidney disease: Secondary | ICD-10-CM | POA: Diagnosis not present

## 2018-01-08 DIAGNOSIS — D472 Monoclonal gammopathy: Secondary | ICD-10-CM | POA: Diagnosis not present

## 2018-01-08 DIAGNOSIS — N186 End stage renal disease: Secondary | ICD-10-CM | POA: Diagnosis not present

## 2018-01-10 DIAGNOSIS — Z992 Dependence on renal dialysis: Secondary | ICD-10-CM | POA: Diagnosis not present

## 2018-01-10 DIAGNOSIS — N186 End stage renal disease: Secondary | ICD-10-CM | POA: Diagnosis not present

## 2018-01-10 DIAGNOSIS — N2581 Secondary hyperparathyroidism of renal origin: Secondary | ICD-10-CM | POA: Diagnosis not present

## 2018-01-10 DIAGNOSIS — D631 Anemia in chronic kidney disease: Secondary | ICD-10-CM | POA: Diagnosis not present

## 2018-01-10 DIAGNOSIS — D472 Monoclonal gammopathy: Secondary | ICD-10-CM | POA: Diagnosis not present

## 2018-01-13 DIAGNOSIS — N2581 Secondary hyperparathyroidism of renal origin: Secondary | ICD-10-CM | POA: Diagnosis not present

## 2018-01-13 DIAGNOSIS — N186 End stage renal disease: Secondary | ICD-10-CM | POA: Diagnosis not present

## 2018-01-13 DIAGNOSIS — Z992 Dependence on renal dialysis: Secondary | ICD-10-CM | POA: Diagnosis not present

## 2018-01-13 DIAGNOSIS — D631 Anemia in chronic kidney disease: Secondary | ICD-10-CM | POA: Diagnosis not present

## 2018-01-13 DIAGNOSIS — D472 Monoclonal gammopathy: Secondary | ICD-10-CM | POA: Diagnosis not present

## 2018-01-15 ENCOUNTER — Other Ambulatory Visit: Payer: Self-pay

## 2018-01-15 DIAGNOSIS — T82510A Breakdown (mechanical) of surgically created arteriovenous fistula, initial encounter: Secondary | ICD-10-CM

## 2018-01-15 DIAGNOSIS — D631 Anemia in chronic kidney disease: Secondary | ICD-10-CM | POA: Diagnosis not present

## 2018-01-15 DIAGNOSIS — Z992 Dependence on renal dialysis: Secondary | ICD-10-CM | POA: Diagnosis not present

## 2018-01-15 DIAGNOSIS — N186 End stage renal disease: Secondary | ICD-10-CM | POA: Diagnosis not present

## 2018-01-15 DIAGNOSIS — N2581 Secondary hyperparathyroidism of renal origin: Secondary | ICD-10-CM | POA: Diagnosis not present

## 2018-01-15 DIAGNOSIS — D472 Monoclonal gammopathy: Secondary | ICD-10-CM | POA: Diagnosis not present

## 2018-01-17 DIAGNOSIS — N2581 Secondary hyperparathyroidism of renal origin: Secondary | ICD-10-CM | POA: Diagnosis not present

## 2018-01-17 DIAGNOSIS — D631 Anemia in chronic kidney disease: Secondary | ICD-10-CM | POA: Diagnosis not present

## 2018-01-17 DIAGNOSIS — N186 End stage renal disease: Secondary | ICD-10-CM | POA: Diagnosis not present

## 2018-01-17 DIAGNOSIS — Z992 Dependence on renal dialysis: Secondary | ICD-10-CM | POA: Diagnosis not present

## 2018-01-17 DIAGNOSIS — D472 Monoclonal gammopathy: Secondary | ICD-10-CM | POA: Diagnosis not present

## 2018-01-20 DIAGNOSIS — Z992 Dependence on renal dialysis: Secondary | ICD-10-CM | POA: Diagnosis not present

## 2018-01-20 DIAGNOSIS — D472 Monoclonal gammopathy: Secondary | ICD-10-CM | POA: Diagnosis not present

## 2018-01-20 DIAGNOSIS — D631 Anemia in chronic kidney disease: Secondary | ICD-10-CM | POA: Diagnosis not present

## 2018-01-20 DIAGNOSIS — N2581 Secondary hyperparathyroidism of renal origin: Secondary | ICD-10-CM | POA: Diagnosis not present

## 2018-01-20 DIAGNOSIS — N186 End stage renal disease: Secondary | ICD-10-CM | POA: Diagnosis not present

## 2018-01-22 DIAGNOSIS — Z992 Dependence on renal dialysis: Secondary | ICD-10-CM | POA: Diagnosis not present

## 2018-01-22 DIAGNOSIS — D631 Anemia in chronic kidney disease: Secondary | ICD-10-CM | POA: Diagnosis not present

## 2018-01-22 DIAGNOSIS — D472 Monoclonal gammopathy: Secondary | ICD-10-CM | POA: Diagnosis not present

## 2018-01-22 DIAGNOSIS — N186 End stage renal disease: Secondary | ICD-10-CM | POA: Diagnosis not present

## 2018-01-22 DIAGNOSIS — N2581 Secondary hyperparathyroidism of renal origin: Secondary | ICD-10-CM | POA: Diagnosis not present

## 2018-01-24 DIAGNOSIS — D472 Monoclonal gammopathy: Secondary | ICD-10-CM | POA: Diagnosis not present

## 2018-01-24 DIAGNOSIS — N186 End stage renal disease: Secondary | ICD-10-CM | POA: Diagnosis not present

## 2018-01-24 DIAGNOSIS — Z992 Dependence on renal dialysis: Secondary | ICD-10-CM | POA: Diagnosis not present

## 2018-01-24 DIAGNOSIS — N2581 Secondary hyperparathyroidism of renal origin: Secondary | ICD-10-CM | POA: Diagnosis not present

## 2018-01-24 DIAGNOSIS — D631 Anemia in chronic kidney disease: Secondary | ICD-10-CM | POA: Diagnosis not present

## 2018-01-27 DIAGNOSIS — Z992 Dependence on renal dialysis: Secondary | ICD-10-CM | POA: Diagnosis not present

## 2018-01-27 DIAGNOSIS — D472 Monoclonal gammopathy: Secondary | ICD-10-CM | POA: Diagnosis not present

## 2018-01-27 DIAGNOSIS — D631 Anemia in chronic kidney disease: Secondary | ICD-10-CM | POA: Diagnosis not present

## 2018-01-27 DIAGNOSIS — N186 End stage renal disease: Secondary | ICD-10-CM | POA: Diagnosis not present

## 2018-01-27 DIAGNOSIS — N2581 Secondary hyperparathyroidism of renal origin: Secondary | ICD-10-CM | POA: Diagnosis not present

## 2018-01-29 DIAGNOSIS — N2581 Secondary hyperparathyroidism of renal origin: Secondary | ICD-10-CM | POA: Diagnosis not present

## 2018-01-29 DIAGNOSIS — N186 End stage renal disease: Secondary | ICD-10-CM | POA: Diagnosis not present

## 2018-01-29 DIAGNOSIS — D631 Anemia in chronic kidney disease: Secondary | ICD-10-CM | POA: Diagnosis not present

## 2018-01-29 DIAGNOSIS — D472 Monoclonal gammopathy: Secondary | ICD-10-CM | POA: Diagnosis not present

## 2018-01-29 DIAGNOSIS — Z992 Dependence on renal dialysis: Secondary | ICD-10-CM | POA: Diagnosis not present

## 2018-01-31 DIAGNOSIS — Z992 Dependence on renal dialysis: Secondary | ICD-10-CM | POA: Diagnosis not present

## 2018-01-31 DIAGNOSIS — I129 Hypertensive chronic kidney disease with stage 1 through stage 4 chronic kidney disease, or unspecified chronic kidney disease: Secondary | ICD-10-CM | POA: Diagnosis not present

## 2018-01-31 DIAGNOSIS — N2581 Secondary hyperparathyroidism of renal origin: Secondary | ICD-10-CM | POA: Diagnosis not present

## 2018-01-31 DIAGNOSIS — N186 End stage renal disease: Secondary | ICD-10-CM | POA: Diagnosis not present

## 2018-01-31 DIAGNOSIS — D631 Anemia in chronic kidney disease: Secondary | ICD-10-CM | POA: Diagnosis not present

## 2018-01-31 DIAGNOSIS — D472 Monoclonal gammopathy: Secondary | ICD-10-CM | POA: Diagnosis not present

## 2018-02-03 DIAGNOSIS — D472 Monoclonal gammopathy: Secondary | ICD-10-CM | POA: Diagnosis not present

## 2018-02-03 DIAGNOSIS — N2581 Secondary hyperparathyroidism of renal origin: Secondary | ICD-10-CM | POA: Diagnosis not present

## 2018-02-03 DIAGNOSIS — D631 Anemia in chronic kidney disease: Secondary | ICD-10-CM | POA: Diagnosis not present

## 2018-02-03 DIAGNOSIS — N186 End stage renal disease: Secondary | ICD-10-CM | POA: Diagnosis not present

## 2018-02-05 DIAGNOSIS — N186 End stage renal disease: Secondary | ICD-10-CM | POA: Diagnosis not present

## 2018-02-05 DIAGNOSIS — D472 Monoclonal gammopathy: Secondary | ICD-10-CM | POA: Diagnosis not present

## 2018-02-05 DIAGNOSIS — D631 Anemia in chronic kidney disease: Secondary | ICD-10-CM | POA: Diagnosis not present

## 2018-02-05 DIAGNOSIS — N2581 Secondary hyperparathyroidism of renal origin: Secondary | ICD-10-CM | POA: Diagnosis not present

## 2018-02-07 DIAGNOSIS — D631 Anemia in chronic kidney disease: Secondary | ICD-10-CM | POA: Diagnosis not present

## 2018-02-07 DIAGNOSIS — N2581 Secondary hyperparathyroidism of renal origin: Secondary | ICD-10-CM | POA: Diagnosis not present

## 2018-02-07 DIAGNOSIS — N186 End stage renal disease: Secondary | ICD-10-CM | POA: Diagnosis not present

## 2018-02-10 DIAGNOSIS — N186 End stage renal disease: Secondary | ICD-10-CM | POA: Diagnosis not present

## 2018-02-10 DIAGNOSIS — N2581 Secondary hyperparathyroidism of renal origin: Secondary | ICD-10-CM | POA: Diagnosis not present

## 2018-02-10 DIAGNOSIS — D631 Anemia in chronic kidney disease: Secondary | ICD-10-CM | POA: Diagnosis not present

## 2018-02-12 DIAGNOSIS — N2581 Secondary hyperparathyroidism of renal origin: Secondary | ICD-10-CM | POA: Diagnosis not present

## 2018-02-12 DIAGNOSIS — N186 End stage renal disease: Secondary | ICD-10-CM | POA: Diagnosis not present

## 2018-02-12 DIAGNOSIS — D631 Anemia in chronic kidney disease: Secondary | ICD-10-CM | POA: Diagnosis not present

## 2018-02-12 DIAGNOSIS — R5383 Other fatigue: Secondary | ICD-10-CM | POA: Diagnosis not present

## 2018-02-14 DIAGNOSIS — N186 End stage renal disease: Secondary | ICD-10-CM | POA: Diagnosis not present

## 2018-02-14 DIAGNOSIS — N2581 Secondary hyperparathyroidism of renal origin: Secondary | ICD-10-CM | POA: Diagnosis not present

## 2018-02-14 DIAGNOSIS — D631 Anemia in chronic kidney disease: Secondary | ICD-10-CM | POA: Diagnosis not present

## 2018-02-17 DIAGNOSIS — D631 Anemia in chronic kidney disease: Secondary | ICD-10-CM | POA: Diagnosis not present

## 2018-02-17 DIAGNOSIS — N186 End stage renal disease: Secondary | ICD-10-CM | POA: Diagnosis not present

## 2018-02-17 DIAGNOSIS — N2581 Secondary hyperparathyroidism of renal origin: Secondary | ICD-10-CM | POA: Diagnosis not present

## 2018-02-19 DIAGNOSIS — N186 End stage renal disease: Secondary | ICD-10-CM | POA: Diagnosis not present

## 2018-02-19 DIAGNOSIS — N2581 Secondary hyperparathyroidism of renal origin: Secondary | ICD-10-CM | POA: Diagnosis not present

## 2018-02-19 DIAGNOSIS — D631 Anemia in chronic kidney disease: Secondary | ICD-10-CM | POA: Diagnosis not present

## 2018-02-20 ENCOUNTER — Inpatient Hospital Stay (HOSPITAL_COMMUNITY): Admission: RE | Admit: 2018-02-20 | Payer: Medicare Other | Source: Ambulatory Visit

## 2018-02-20 ENCOUNTER — Ambulatory Visit (INDEPENDENT_AMBULATORY_CARE_PROVIDER_SITE_OTHER): Payer: Medicare Other | Admitting: Vascular Surgery

## 2018-02-20 ENCOUNTER — Other Ambulatory Visit: Payer: Self-pay

## 2018-02-20 ENCOUNTER — Encounter: Payer: Self-pay | Admitting: Vascular Surgery

## 2018-02-20 VITALS — BP 129/65 | HR 55 | Temp 97.0°F | Resp 20 | Ht 68.0 in | Wt 162.3 lb

## 2018-02-20 DIAGNOSIS — N186 End stage renal disease: Secondary | ICD-10-CM | POA: Diagnosis not present

## 2018-02-20 DIAGNOSIS — Z992 Dependence on renal dialysis: Secondary | ICD-10-CM | POA: Diagnosis not present

## 2018-02-20 NOTE — Progress Notes (Signed)
Patient is a 84 year old male who presents today for follow-up regarding a steal syndrome from his left arm AV fistula.  Previously saw my partner Dr. Scot Dock in August 2018 for similar symptoms.  Dr. Bridgett Larsson subsequently did an arteriogram on him and suggested that the only treatment option would be ligation of the fistula and the patient decided he did not have significant enough symptoms.  He returns today complaining of intermittent cramping in his left hand.  Usually this resolves with rubbing the hand.  He denies any ulcerations on the hand he denies any constant pain.  Overall the fistula is fairly well tolerated.  Physical exam:  Vitals:   02/20/18 1141  BP: 129/65  Pulse: (!) 55  Resp: 20  Temp: (!) 97 F (36.1 C)  TempSrc: Oral  SpO2: 100%  Weight: 162 lb 4.8 oz (73.6 kg)  Height: 5\' 8"  (1.727 m)    Left upper extremity: Palpable thrill audible bruit in fistula.  No palpable radial pulse.  No ulcerations on the fingertips.  No evidence of muscle atrophy left compared to right.  Assessment: Mild to moderate steal symptoms left hand.  I discussed with the patient today ligation of the fistula and placing a new access for catheter.  However, he states his symptoms are currently tolerable and he did not want to go into starting a brand-new access over at this point.  Plan: He will call us at any time if the pain becomes worse over time if he develops muscle atrophy or ulcerations on the fingertips or starts to lose sensation or grip strength in his hand.  Ruta Hinds, MD [facility] Office: [phone] Pager: [phone]

## 2018-02-21 DIAGNOSIS — D631 Anemia in chronic kidney disease: Secondary | ICD-10-CM | POA: Diagnosis not present

## 2018-02-21 DIAGNOSIS — N186 End stage renal disease: Secondary | ICD-10-CM | POA: Diagnosis not present

## 2018-02-21 DIAGNOSIS — N2581 Secondary hyperparathyroidism of renal origin: Secondary | ICD-10-CM | POA: Diagnosis not present

## 2018-02-24 DIAGNOSIS — D631 Anemia in chronic kidney disease: Secondary | ICD-10-CM | POA: Diagnosis not present

## 2018-02-24 DIAGNOSIS — N2581 Secondary hyperparathyroidism of renal origin: Secondary | ICD-10-CM | POA: Diagnosis not present

## 2018-02-24 DIAGNOSIS — N186 End stage renal disease: Secondary | ICD-10-CM | POA: Diagnosis not present

## 2018-02-26 DIAGNOSIS — N2581 Secondary hyperparathyroidism of renal origin: Secondary | ICD-10-CM | POA: Diagnosis not present

## 2018-02-26 DIAGNOSIS — D631 Anemia in chronic kidney disease: Secondary | ICD-10-CM | POA: Diagnosis not present

## 2018-02-26 DIAGNOSIS — N186 End stage renal disease: Secondary | ICD-10-CM | POA: Diagnosis not present

## 2018-02-28 DIAGNOSIS — N2581 Secondary hyperparathyroidism of renal origin: Secondary | ICD-10-CM | POA: Diagnosis not present

## 2018-02-28 DIAGNOSIS — D631 Anemia in chronic kidney disease: Secondary | ICD-10-CM | POA: Diagnosis not present

## 2018-02-28 DIAGNOSIS — N186 End stage renal disease: Secondary | ICD-10-CM | POA: Diagnosis not present

## 2018-03-03 DIAGNOSIS — I129 Hypertensive chronic kidney disease with stage 1 through stage 4 chronic kidney disease, or unspecified chronic kidney disease: Secondary | ICD-10-CM | POA: Diagnosis not present

## 2018-03-03 DIAGNOSIS — Z992 Dependence on renal dialysis: Secondary | ICD-10-CM | POA: Diagnosis not present

## 2018-03-03 DIAGNOSIS — N186 End stage renal disease: Secondary | ICD-10-CM | POA: Diagnosis not present

## 2018-03-03 DIAGNOSIS — D631 Anemia in chronic kidney disease: Secondary | ICD-10-CM | POA: Diagnosis not present

## 2018-03-03 DIAGNOSIS — N2581 Secondary hyperparathyroidism of renal origin: Secondary | ICD-10-CM | POA: Diagnosis not present

## 2018-03-05 DIAGNOSIS — N2581 Secondary hyperparathyroidism of renal origin: Secondary | ICD-10-CM | POA: Diagnosis not present

## 2018-03-05 DIAGNOSIS — N186 End stage renal disease: Secondary | ICD-10-CM | POA: Diagnosis not present

## 2018-03-05 DIAGNOSIS — D631 Anemia in chronic kidney disease: Secondary | ICD-10-CM | POA: Diagnosis not present

## 2018-03-06 DIAGNOSIS — E038 Other specified hypothyroidism: Secondary | ICD-10-CM | POA: Diagnosis not present

## 2018-03-06 DIAGNOSIS — Z6823 Body mass index (BMI) 23.0-23.9, adult: Secondary | ICD-10-CM | POA: Diagnosis not present

## 2018-03-06 DIAGNOSIS — R5381 Other malaise: Secondary | ICD-10-CM | POA: Diagnosis not present

## 2018-03-06 DIAGNOSIS — J302 Other seasonal allergic rhinitis: Secondary | ICD-10-CM | POA: Diagnosis not present

## 2018-03-06 DIAGNOSIS — E559 Vitamin D deficiency, unspecified: Secondary | ICD-10-CM | POA: Diagnosis not present

## 2018-03-06 DIAGNOSIS — L8941 Pressure ulcer of contiguous site of back, buttock and hip, stage 1: Secondary | ICD-10-CM | POA: Diagnosis not present

## 2018-03-06 DIAGNOSIS — I120 Hypertensive chronic kidney disease with stage 5 chronic kidney disease or end stage renal disease: Secondary | ICD-10-CM | POA: Diagnosis not present

## 2018-03-07 DIAGNOSIS — N186 End stage renal disease: Secondary | ICD-10-CM | POA: Diagnosis not present

## 2018-03-07 DIAGNOSIS — D631 Anemia in chronic kidney disease: Secondary | ICD-10-CM | POA: Diagnosis not present

## 2018-03-07 DIAGNOSIS — N2581 Secondary hyperparathyroidism of renal origin: Secondary | ICD-10-CM | POA: Diagnosis not present

## 2018-03-10 DIAGNOSIS — D631 Anemia in chronic kidney disease: Secondary | ICD-10-CM | POA: Diagnosis not present

## 2018-03-10 DIAGNOSIS — N186 End stage renal disease: Secondary | ICD-10-CM | POA: Diagnosis not present

## 2018-03-10 DIAGNOSIS — N2581 Secondary hyperparathyroidism of renal origin: Secondary | ICD-10-CM | POA: Diagnosis not present

## 2018-03-12 DIAGNOSIS — D631 Anemia in chronic kidney disease: Secondary | ICD-10-CM | POA: Diagnosis not present

## 2018-03-12 DIAGNOSIS — N2581 Secondary hyperparathyroidism of renal origin: Secondary | ICD-10-CM | POA: Diagnosis not present

## 2018-03-12 DIAGNOSIS — N186 End stage renal disease: Secondary | ICD-10-CM | POA: Diagnosis not present

## 2018-03-14 DIAGNOSIS — D631 Anemia in chronic kidney disease: Secondary | ICD-10-CM | POA: Diagnosis not present

## 2018-03-14 DIAGNOSIS — N2581 Secondary hyperparathyroidism of renal origin: Secondary | ICD-10-CM | POA: Diagnosis not present

## 2018-03-14 DIAGNOSIS — N186 End stage renal disease: Secondary | ICD-10-CM | POA: Diagnosis not present

## 2018-03-17 DIAGNOSIS — N2581 Secondary hyperparathyroidism of renal origin: Secondary | ICD-10-CM | POA: Diagnosis not present

## 2018-03-17 DIAGNOSIS — N186 End stage renal disease: Secondary | ICD-10-CM | POA: Diagnosis not present

## 2018-03-17 DIAGNOSIS — D631 Anemia in chronic kidney disease: Secondary | ICD-10-CM | POA: Diagnosis not present

## 2018-03-19 DIAGNOSIS — N2581 Secondary hyperparathyroidism of renal origin: Secondary | ICD-10-CM | POA: Diagnosis not present

## 2018-03-19 DIAGNOSIS — D631 Anemia in chronic kidney disease: Secondary | ICD-10-CM | POA: Diagnosis not present

## 2018-03-19 DIAGNOSIS — N186 End stage renal disease: Secondary | ICD-10-CM | POA: Diagnosis not present

## 2018-03-21 DIAGNOSIS — N186 End stage renal disease: Secondary | ICD-10-CM | POA: Diagnosis not present

## 2018-03-21 DIAGNOSIS — D631 Anemia in chronic kidney disease: Secondary | ICD-10-CM | POA: Diagnosis not present

## 2018-03-21 DIAGNOSIS — N2581 Secondary hyperparathyroidism of renal origin: Secondary | ICD-10-CM | POA: Diagnosis not present

## 2018-03-24 DIAGNOSIS — N2581 Secondary hyperparathyroidism of renal origin: Secondary | ICD-10-CM | POA: Diagnosis not present

## 2018-03-24 DIAGNOSIS — D631 Anemia in chronic kidney disease: Secondary | ICD-10-CM | POA: Diagnosis not present

## 2018-03-24 DIAGNOSIS — N186 End stage renal disease: Secondary | ICD-10-CM | POA: Diagnosis not present

## 2018-03-26 DIAGNOSIS — D631 Anemia in chronic kidney disease: Secondary | ICD-10-CM | POA: Diagnosis not present

## 2018-03-26 DIAGNOSIS — N186 End stage renal disease: Secondary | ICD-10-CM | POA: Diagnosis not present

## 2018-03-26 DIAGNOSIS — N2581 Secondary hyperparathyroidism of renal origin: Secondary | ICD-10-CM | POA: Diagnosis not present

## 2018-03-28 DIAGNOSIS — N186 End stage renal disease: Secondary | ICD-10-CM | POA: Diagnosis not present

## 2018-03-28 DIAGNOSIS — N2581 Secondary hyperparathyroidism of renal origin: Secondary | ICD-10-CM | POA: Diagnosis not present

## 2018-03-28 DIAGNOSIS — D631 Anemia in chronic kidney disease: Secondary | ICD-10-CM | POA: Diagnosis not present

## 2018-03-31 DIAGNOSIS — D631 Anemia in chronic kidney disease: Secondary | ICD-10-CM | POA: Diagnosis not present

## 2018-03-31 DIAGNOSIS — N2581 Secondary hyperparathyroidism of renal origin: Secondary | ICD-10-CM | POA: Diagnosis not present

## 2018-03-31 DIAGNOSIS — N186 End stage renal disease: Secondary | ICD-10-CM | POA: Diagnosis not present

## 2018-04-02 DIAGNOSIS — N2581 Secondary hyperparathyroidism of renal origin: Secondary | ICD-10-CM | POA: Diagnosis not present

## 2018-04-02 DIAGNOSIS — N186 End stage renal disease: Secondary | ICD-10-CM | POA: Diagnosis not present

## 2018-04-02 DIAGNOSIS — D631 Anemia in chronic kidney disease: Secondary | ICD-10-CM | POA: Diagnosis not present

## 2018-04-02 DIAGNOSIS — Z992 Dependence on renal dialysis: Secondary | ICD-10-CM | POA: Diagnosis not present

## 2018-04-02 DIAGNOSIS — I129 Hypertensive chronic kidney disease with stage 1 through stage 4 chronic kidney disease, or unspecified chronic kidney disease: Secondary | ICD-10-CM | POA: Diagnosis not present

## 2018-04-04 DIAGNOSIS — N2581 Secondary hyperparathyroidism of renal origin: Secondary | ICD-10-CM | POA: Diagnosis not present

## 2018-04-04 DIAGNOSIS — N186 End stage renal disease: Secondary | ICD-10-CM | POA: Diagnosis not present

## 2018-04-04 DIAGNOSIS — D631 Anemia in chronic kidney disease: Secondary | ICD-10-CM | POA: Diagnosis not present

## 2018-04-07 DIAGNOSIS — N2581 Secondary hyperparathyroidism of renal origin: Secondary | ICD-10-CM | POA: Diagnosis not present

## 2018-04-07 DIAGNOSIS — N186 End stage renal disease: Secondary | ICD-10-CM | POA: Diagnosis not present

## 2018-04-07 DIAGNOSIS — D631 Anemia in chronic kidney disease: Secondary | ICD-10-CM | POA: Diagnosis not present

## 2018-04-09 DIAGNOSIS — N186 End stage renal disease: Secondary | ICD-10-CM | POA: Diagnosis not present

## 2018-04-09 DIAGNOSIS — N2581 Secondary hyperparathyroidism of renal origin: Secondary | ICD-10-CM | POA: Diagnosis not present

## 2018-04-09 DIAGNOSIS — D631 Anemia in chronic kidney disease: Secondary | ICD-10-CM | POA: Diagnosis not present

## 2018-04-11 DIAGNOSIS — D631 Anemia in chronic kidney disease: Secondary | ICD-10-CM | POA: Diagnosis not present

## 2018-04-11 DIAGNOSIS — N2581 Secondary hyperparathyroidism of renal origin: Secondary | ICD-10-CM | POA: Diagnosis not present

## 2018-04-11 DIAGNOSIS — N186 End stage renal disease: Secondary | ICD-10-CM | POA: Diagnosis not present

## 2018-04-14 DIAGNOSIS — D631 Anemia in chronic kidney disease: Secondary | ICD-10-CM | POA: Diagnosis not present

## 2018-04-14 DIAGNOSIS — N2581 Secondary hyperparathyroidism of renal origin: Secondary | ICD-10-CM | POA: Diagnosis not present

## 2018-04-14 DIAGNOSIS — N186 End stage renal disease: Secondary | ICD-10-CM | POA: Diagnosis not present

## 2018-04-16 DIAGNOSIS — N186 End stage renal disease: Secondary | ICD-10-CM | POA: Diagnosis not present

## 2018-04-16 DIAGNOSIS — D631 Anemia in chronic kidney disease: Secondary | ICD-10-CM | POA: Diagnosis not present

## 2018-04-16 DIAGNOSIS — N2581 Secondary hyperparathyroidism of renal origin: Secondary | ICD-10-CM | POA: Diagnosis not present

## 2018-04-18 DIAGNOSIS — N2581 Secondary hyperparathyroidism of renal origin: Secondary | ICD-10-CM | POA: Diagnosis not present

## 2018-04-18 DIAGNOSIS — N186 End stage renal disease: Secondary | ICD-10-CM | POA: Diagnosis not present

## 2018-04-18 DIAGNOSIS — D631 Anemia in chronic kidney disease: Secondary | ICD-10-CM | POA: Diagnosis not present

## 2018-04-21 DIAGNOSIS — N2581 Secondary hyperparathyroidism of renal origin: Secondary | ICD-10-CM | POA: Diagnosis not present

## 2018-04-21 DIAGNOSIS — N186 End stage renal disease: Secondary | ICD-10-CM | POA: Diagnosis not present

## 2018-04-21 DIAGNOSIS — D631 Anemia in chronic kidney disease: Secondary | ICD-10-CM | POA: Diagnosis not present

## 2018-04-23 DIAGNOSIS — N2581 Secondary hyperparathyroidism of renal origin: Secondary | ICD-10-CM | POA: Diagnosis not present

## 2018-04-23 DIAGNOSIS — N186 End stage renal disease: Secondary | ICD-10-CM | POA: Diagnosis not present

## 2018-04-23 DIAGNOSIS — D631 Anemia in chronic kidney disease: Secondary | ICD-10-CM | POA: Diagnosis not present

## 2018-04-25 DIAGNOSIS — D631 Anemia in chronic kidney disease: Secondary | ICD-10-CM | POA: Diagnosis not present

## 2018-04-25 DIAGNOSIS — N2581 Secondary hyperparathyroidism of renal origin: Secondary | ICD-10-CM | POA: Diagnosis not present

## 2018-04-25 DIAGNOSIS — N186 End stage renal disease: Secondary | ICD-10-CM | POA: Diagnosis not present

## 2018-04-28 DIAGNOSIS — N186 End stage renal disease: Secondary | ICD-10-CM | POA: Diagnosis not present

## 2018-04-28 DIAGNOSIS — D631 Anemia in chronic kidney disease: Secondary | ICD-10-CM | POA: Diagnosis not present

## 2018-04-28 DIAGNOSIS — N2581 Secondary hyperparathyroidism of renal origin: Secondary | ICD-10-CM | POA: Diagnosis not present

## 2018-04-30 DIAGNOSIS — N2581 Secondary hyperparathyroidism of renal origin: Secondary | ICD-10-CM | POA: Diagnosis not present

## 2018-04-30 DIAGNOSIS — N186 End stage renal disease: Secondary | ICD-10-CM | POA: Diagnosis not present

## 2018-04-30 DIAGNOSIS — D631 Anemia in chronic kidney disease: Secondary | ICD-10-CM | POA: Diagnosis not present

## 2018-05-02 DIAGNOSIS — N186 End stage renal disease: Secondary | ICD-10-CM | POA: Diagnosis not present

## 2018-05-02 DIAGNOSIS — D631 Anemia in chronic kidney disease: Secondary | ICD-10-CM | POA: Diagnosis not present

## 2018-05-02 DIAGNOSIS — N2581 Secondary hyperparathyroidism of renal origin: Secondary | ICD-10-CM | POA: Diagnosis not present

## 2018-05-05 DIAGNOSIS — N2581 Secondary hyperparathyroidism of renal origin: Secondary | ICD-10-CM | POA: Diagnosis not present

## 2018-05-05 DIAGNOSIS — N186 End stage renal disease: Secondary | ICD-10-CM | POA: Diagnosis not present

## 2018-05-05 DIAGNOSIS — D631 Anemia in chronic kidney disease: Secondary | ICD-10-CM | POA: Diagnosis not present

## 2018-05-07 DIAGNOSIS — N2581 Secondary hyperparathyroidism of renal origin: Secondary | ICD-10-CM | POA: Diagnosis not present

## 2018-05-07 DIAGNOSIS — N186 End stage renal disease: Secondary | ICD-10-CM | POA: Diagnosis not present

## 2018-05-07 DIAGNOSIS — D631 Anemia in chronic kidney disease: Secondary | ICD-10-CM | POA: Diagnosis not present

## 2018-05-09 DIAGNOSIS — D631 Anemia in chronic kidney disease: Secondary | ICD-10-CM | POA: Diagnosis not present

## 2018-05-09 DIAGNOSIS — N2581 Secondary hyperparathyroidism of renal origin: Secondary | ICD-10-CM | POA: Diagnosis not present

## 2018-05-09 DIAGNOSIS — N186 End stage renal disease: Secondary | ICD-10-CM | POA: Diagnosis not present

## 2018-05-12 DIAGNOSIS — D631 Anemia in chronic kidney disease: Secondary | ICD-10-CM | POA: Diagnosis not present

## 2018-05-12 DIAGNOSIS — N186 End stage renal disease: Secondary | ICD-10-CM | POA: Diagnosis not present

## 2018-05-12 DIAGNOSIS — N2581 Secondary hyperparathyroidism of renal origin: Secondary | ICD-10-CM | POA: Diagnosis not present

## 2018-05-14 DIAGNOSIS — D631 Anemia in chronic kidney disease: Secondary | ICD-10-CM | POA: Diagnosis not present

## 2018-05-14 DIAGNOSIS — N2581 Secondary hyperparathyroidism of renal origin: Secondary | ICD-10-CM | POA: Diagnosis not present

## 2018-05-14 DIAGNOSIS — N186 End stage renal disease: Secondary | ICD-10-CM | POA: Diagnosis not present

## 2018-05-16 DIAGNOSIS — N2581 Secondary hyperparathyroidism of renal origin: Secondary | ICD-10-CM | POA: Diagnosis not present

## 2018-05-16 DIAGNOSIS — D631 Anemia in chronic kidney disease: Secondary | ICD-10-CM | POA: Diagnosis not present

## 2018-05-16 DIAGNOSIS — N186 End stage renal disease: Secondary | ICD-10-CM | POA: Diagnosis not present

## 2018-05-19 DIAGNOSIS — N186 End stage renal disease: Secondary | ICD-10-CM | POA: Diagnosis not present

## 2018-05-19 DIAGNOSIS — N2581 Secondary hyperparathyroidism of renal origin: Secondary | ICD-10-CM | POA: Diagnosis not present

## 2018-05-19 DIAGNOSIS — D631 Anemia in chronic kidney disease: Secondary | ICD-10-CM | POA: Diagnosis not present

## 2018-05-20 DIAGNOSIS — N2581 Secondary hyperparathyroidism of renal origin: Secondary | ICD-10-CM | POA: Diagnosis not present

## 2018-05-20 DIAGNOSIS — D631 Anemia in chronic kidney disease: Secondary | ICD-10-CM | POA: Diagnosis not present

## 2018-05-20 DIAGNOSIS — N186 End stage renal disease: Secondary | ICD-10-CM | POA: Diagnosis not present

## 2018-05-23 DIAGNOSIS — D631 Anemia in chronic kidney disease: Secondary | ICD-10-CM | POA: Diagnosis not present

## 2018-05-23 DIAGNOSIS — N2581 Secondary hyperparathyroidism of renal origin: Secondary | ICD-10-CM | POA: Diagnosis not present

## 2018-05-23 DIAGNOSIS — N186 End stage renal disease: Secondary | ICD-10-CM | POA: Diagnosis not present

## 2018-05-26 DIAGNOSIS — N186 End stage renal disease: Secondary | ICD-10-CM | POA: Diagnosis not present

## 2018-05-26 DIAGNOSIS — N2581 Secondary hyperparathyroidism of renal origin: Secondary | ICD-10-CM | POA: Diagnosis not present

## 2018-05-26 DIAGNOSIS — D631 Anemia in chronic kidney disease: Secondary | ICD-10-CM | POA: Diagnosis not present

## 2018-05-28 DIAGNOSIS — D631 Anemia in chronic kidney disease: Secondary | ICD-10-CM | POA: Diagnosis not present

## 2018-05-28 DIAGNOSIS — N2581 Secondary hyperparathyroidism of renal origin: Secondary | ICD-10-CM | POA: Diagnosis not present

## 2018-05-28 DIAGNOSIS — N186 End stage renal disease: Secondary | ICD-10-CM | POA: Diagnosis not present

## 2018-05-30 DIAGNOSIS — N186 End stage renal disease: Secondary | ICD-10-CM | POA: Diagnosis not present

## 2018-05-30 DIAGNOSIS — N2581 Secondary hyperparathyroidism of renal origin: Secondary | ICD-10-CM | POA: Diagnosis not present

## 2018-05-30 DIAGNOSIS — D631 Anemia in chronic kidney disease: Secondary | ICD-10-CM | POA: Diagnosis not present

## 2018-06-01 DIAGNOSIS — Z992 Dependence on renal dialysis: Secondary | ICD-10-CM | POA: Diagnosis not present

## 2018-06-01 DIAGNOSIS — I129 Hypertensive chronic kidney disease with stage 1 through stage 4 chronic kidney disease, or unspecified chronic kidney disease: Secondary | ICD-10-CM | POA: Diagnosis not present

## 2018-06-01 DIAGNOSIS — N186 End stage renal disease: Secondary | ICD-10-CM | POA: Diagnosis not present

## 2018-06-02 DIAGNOSIS — I129 Hypertensive chronic kidney disease with stage 1 through stage 4 chronic kidney disease, or unspecified chronic kidney disease: Secondary | ICD-10-CM | POA: Diagnosis not present

## 2018-06-02 DIAGNOSIS — N2581 Secondary hyperparathyroidism of renal origin: Secondary | ICD-10-CM | POA: Diagnosis not present

## 2018-06-02 DIAGNOSIS — Z992 Dependence on renal dialysis: Secondary | ICD-10-CM | POA: Diagnosis not present

## 2018-06-02 DIAGNOSIS — N186 End stage renal disease: Secondary | ICD-10-CM | POA: Diagnosis not present

## 2018-06-02 DIAGNOSIS — D631 Anemia in chronic kidney disease: Secondary | ICD-10-CM | POA: Diagnosis not present

## 2018-06-03 DIAGNOSIS — Z125 Encounter for screening for malignant neoplasm of prostate: Secondary | ICD-10-CM | POA: Diagnosis not present

## 2018-06-03 DIAGNOSIS — E559 Vitamin D deficiency, unspecified: Secondary | ICD-10-CM | POA: Diagnosis not present

## 2018-06-03 DIAGNOSIS — I10 Essential (primary) hypertension: Secondary | ICD-10-CM | POA: Diagnosis not present

## 2018-06-03 DIAGNOSIS — E038 Other specified hypothyroidism: Secondary | ICD-10-CM | POA: Diagnosis not present

## 2018-06-03 DIAGNOSIS — R82998 Other abnormal findings in urine: Secondary | ICD-10-CM | POA: Diagnosis not present

## 2018-06-04 DIAGNOSIS — D631 Anemia in chronic kidney disease: Secondary | ICD-10-CM | POA: Diagnosis not present

## 2018-06-04 DIAGNOSIS — N2581 Secondary hyperparathyroidism of renal origin: Secondary | ICD-10-CM | POA: Diagnosis not present

## 2018-06-04 DIAGNOSIS — N186 End stage renal disease: Secondary | ICD-10-CM | POA: Diagnosis not present

## 2018-06-06 DIAGNOSIS — D631 Anemia in chronic kidney disease: Secondary | ICD-10-CM | POA: Diagnosis not present

## 2018-06-06 DIAGNOSIS — N186 End stage renal disease: Secondary | ICD-10-CM | POA: Diagnosis not present

## 2018-06-06 DIAGNOSIS — N2581 Secondary hyperparathyroidism of renal origin: Secondary | ICD-10-CM | POA: Diagnosis not present

## 2018-06-09 DIAGNOSIS — D631 Anemia in chronic kidney disease: Secondary | ICD-10-CM | POA: Diagnosis not present

## 2018-06-09 DIAGNOSIS — N2581 Secondary hyperparathyroidism of renal origin: Secondary | ICD-10-CM | POA: Diagnosis not present

## 2018-06-09 DIAGNOSIS — N186 End stage renal disease: Secondary | ICD-10-CM | POA: Diagnosis not present

## 2018-06-10 DIAGNOSIS — Z1389 Encounter for screening for other disorder: Secondary | ICD-10-CM | POA: Diagnosis not present

## 2018-06-10 DIAGNOSIS — E038 Other specified hypothyroidism: Secondary | ICD-10-CM | POA: Diagnosis not present

## 2018-06-10 DIAGNOSIS — Z0000 Encounter for general adult medical examination without abnormal findings: Secondary | ICD-10-CM | POA: Diagnosis not present

## 2018-06-10 DIAGNOSIS — D6489 Other specified anemias: Secondary | ICD-10-CM | POA: Diagnosis not present

## 2018-06-10 DIAGNOSIS — N400 Enlarged prostate without lower urinary tract symptoms: Secondary | ICD-10-CM | POA: Diagnosis not present

## 2018-06-10 DIAGNOSIS — I120 Hypertensive chronic kidney disease with stage 5 chronic kidney disease or end stage renal disease: Secondary | ICD-10-CM | POA: Diagnosis not present

## 2018-06-10 DIAGNOSIS — Z6824 Body mass index (BMI) 24.0-24.9, adult: Secondary | ICD-10-CM | POA: Diagnosis not present

## 2018-06-10 DIAGNOSIS — N184 Chronic kidney disease, stage 4 (severe): Secondary | ICD-10-CM | POA: Diagnosis not present

## 2018-06-10 DIAGNOSIS — L8941 Pressure ulcer of contiguous site of back, buttock and hip, stage 1: Secondary | ICD-10-CM | POA: Diagnosis not present

## 2018-06-10 DIAGNOSIS — E559 Vitamin D deficiency, unspecified: Secondary | ICD-10-CM | POA: Diagnosis not present

## 2018-06-10 DIAGNOSIS — F3289 Other specified depressive episodes: Secondary | ICD-10-CM | POA: Diagnosis not present

## 2018-06-10 DIAGNOSIS — R209 Unspecified disturbances of skin sensation: Secondary | ICD-10-CM | POA: Diagnosis not present

## 2018-06-11 DIAGNOSIS — N186 End stage renal disease: Secondary | ICD-10-CM | POA: Diagnosis not present

## 2018-06-11 DIAGNOSIS — D631 Anemia in chronic kidney disease: Secondary | ICD-10-CM | POA: Diagnosis not present

## 2018-06-11 DIAGNOSIS — N2581 Secondary hyperparathyroidism of renal origin: Secondary | ICD-10-CM | POA: Diagnosis not present

## 2018-06-13 DIAGNOSIS — N2581 Secondary hyperparathyroidism of renal origin: Secondary | ICD-10-CM | POA: Diagnosis not present

## 2018-06-13 DIAGNOSIS — Z1212 Encounter for screening for malignant neoplasm of rectum: Secondary | ICD-10-CM | POA: Diagnosis not present

## 2018-06-13 DIAGNOSIS — N186 End stage renal disease: Secondary | ICD-10-CM | POA: Diagnosis not present

## 2018-06-13 DIAGNOSIS — D631 Anemia in chronic kidney disease: Secondary | ICD-10-CM | POA: Diagnosis not present

## 2018-06-16 DIAGNOSIS — N186 End stage renal disease: Secondary | ICD-10-CM | POA: Diagnosis not present

## 2018-06-16 DIAGNOSIS — N2581 Secondary hyperparathyroidism of renal origin: Secondary | ICD-10-CM | POA: Diagnosis not present

## 2018-06-16 DIAGNOSIS — D631 Anemia in chronic kidney disease: Secondary | ICD-10-CM | POA: Diagnosis not present

## 2018-06-18 DIAGNOSIS — N186 End stage renal disease: Secondary | ICD-10-CM | POA: Diagnosis not present

## 2018-06-18 DIAGNOSIS — D631 Anemia in chronic kidney disease: Secondary | ICD-10-CM | POA: Diagnosis not present

## 2018-06-18 DIAGNOSIS — N2581 Secondary hyperparathyroidism of renal origin: Secondary | ICD-10-CM | POA: Diagnosis not present

## 2018-06-20 DIAGNOSIS — N186 End stage renal disease: Secondary | ICD-10-CM | POA: Diagnosis not present

## 2018-06-20 DIAGNOSIS — D631 Anemia in chronic kidney disease: Secondary | ICD-10-CM | POA: Diagnosis not present

## 2018-06-20 DIAGNOSIS — N2581 Secondary hyperparathyroidism of renal origin: Secondary | ICD-10-CM | POA: Diagnosis not present

## 2018-06-23 DIAGNOSIS — N2581 Secondary hyperparathyroidism of renal origin: Secondary | ICD-10-CM | POA: Diagnosis not present

## 2018-06-23 DIAGNOSIS — N186 End stage renal disease: Secondary | ICD-10-CM | POA: Diagnosis not present

## 2018-06-23 DIAGNOSIS — D631 Anemia in chronic kidney disease: Secondary | ICD-10-CM | POA: Diagnosis not present

## 2018-06-25 DIAGNOSIS — N186 End stage renal disease: Secondary | ICD-10-CM | POA: Diagnosis not present

## 2018-06-25 DIAGNOSIS — D631 Anemia in chronic kidney disease: Secondary | ICD-10-CM | POA: Diagnosis not present

## 2018-06-25 DIAGNOSIS — N2581 Secondary hyperparathyroidism of renal origin: Secondary | ICD-10-CM | POA: Diagnosis not present

## 2018-06-27 DIAGNOSIS — N186 End stage renal disease: Secondary | ICD-10-CM | POA: Diagnosis not present

## 2018-06-27 DIAGNOSIS — D631 Anemia in chronic kidney disease: Secondary | ICD-10-CM | POA: Diagnosis not present

## 2018-06-27 DIAGNOSIS — N2581 Secondary hyperparathyroidism of renal origin: Secondary | ICD-10-CM | POA: Diagnosis not present

## 2018-06-30 DIAGNOSIS — D631 Anemia in chronic kidney disease: Secondary | ICD-10-CM | POA: Diagnosis not present

## 2018-06-30 DIAGNOSIS — N2581 Secondary hyperparathyroidism of renal origin: Secondary | ICD-10-CM | POA: Diagnosis not present

## 2018-06-30 DIAGNOSIS — N186 End stage renal disease: Secondary | ICD-10-CM | POA: Diagnosis not present

## 2018-07-02 DIAGNOSIS — N186 End stage renal disease: Secondary | ICD-10-CM | POA: Diagnosis not present

## 2018-07-02 DIAGNOSIS — D631 Anemia in chronic kidney disease: Secondary | ICD-10-CM | POA: Diagnosis not present

## 2018-07-02 DIAGNOSIS — N2581 Secondary hyperparathyroidism of renal origin: Secondary | ICD-10-CM | POA: Diagnosis not present

## 2018-07-03 DIAGNOSIS — Z992 Dependence on renal dialysis: Secondary | ICD-10-CM | POA: Diagnosis not present

## 2018-07-03 DIAGNOSIS — I129 Hypertensive chronic kidney disease with stage 1 through stage 4 chronic kidney disease, or unspecified chronic kidney disease: Secondary | ICD-10-CM | POA: Diagnosis not present

## 2018-07-03 DIAGNOSIS — N186 End stage renal disease: Secondary | ICD-10-CM | POA: Diagnosis not present

## 2018-07-04 DIAGNOSIS — N186 End stage renal disease: Secondary | ICD-10-CM | POA: Diagnosis not present

## 2018-07-04 DIAGNOSIS — D631 Anemia in chronic kidney disease: Secondary | ICD-10-CM | POA: Diagnosis not present

## 2018-07-04 DIAGNOSIS — N2581 Secondary hyperparathyroidism of renal origin: Secondary | ICD-10-CM | POA: Diagnosis not present

## 2018-07-07 DIAGNOSIS — N2581 Secondary hyperparathyroidism of renal origin: Secondary | ICD-10-CM | POA: Diagnosis not present

## 2018-07-07 DIAGNOSIS — D631 Anemia in chronic kidney disease: Secondary | ICD-10-CM | POA: Diagnosis not present

## 2018-07-07 DIAGNOSIS — N186 End stage renal disease: Secondary | ICD-10-CM | POA: Diagnosis not present

## 2018-07-09 DIAGNOSIS — N186 End stage renal disease: Secondary | ICD-10-CM | POA: Diagnosis not present

## 2018-07-09 DIAGNOSIS — D631 Anemia in chronic kidney disease: Secondary | ICD-10-CM | POA: Diagnosis not present

## 2018-07-09 DIAGNOSIS — N2581 Secondary hyperparathyroidism of renal origin: Secondary | ICD-10-CM | POA: Diagnosis not present

## 2018-07-11 DIAGNOSIS — D631 Anemia in chronic kidney disease: Secondary | ICD-10-CM | POA: Diagnosis not present

## 2018-07-11 DIAGNOSIS — N186 End stage renal disease: Secondary | ICD-10-CM | POA: Diagnosis not present

## 2018-07-11 DIAGNOSIS — N2581 Secondary hyperparathyroidism of renal origin: Secondary | ICD-10-CM | POA: Diagnosis not present

## 2018-07-14 DIAGNOSIS — D631 Anemia in chronic kidney disease: Secondary | ICD-10-CM | POA: Diagnosis not present

## 2018-07-14 DIAGNOSIS — N186 End stage renal disease: Secondary | ICD-10-CM | POA: Diagnosis not present

## 2018-07-14 DIAGNOSIS — N2581 Secondary hyperparathyroidism of renal origin: Secondary | ICD-10-CM | POA: Diagnosis not present

## 2018-07-16 DIAGNOSIS — D631 Anemia in chronic kidney disease: Secondary | ICD-10-CM | POA: Diagnosis not present

## 2018-07-16 DIAGNOSIS — N2581 Secondary hyperparathyroidism of renal origin: Secondary | ICD-10-CM | POA: Diagnosis not present

## 2018-07-16 DIAGNOSIS — N186 End stage renal disease: Secondary | ICD-10-CM | POA: Diagnosis not present

## 2018-07-18 DIAGNOSIS — N186 End stage renal disease: Secondary | ICD-10-CM | POA: Diagnosis not present

## 2018-07-18 DIAGNOSIS — D631 Anemia in chronic kidney disease: Secondary | ICD-10-CM | POA: Diagnosis not present

## 2018-07-18 DIAGNOSIS — N2581 Secondary hyperparathyroidism of renal origin: Secondary | ICD-10-CM | POA: Diagnosis not present

## 2018-07-21 DIAGNOSIS — N186 End stage renal disease: Secondary | ICD-10-CM | POA: Diagnosis not present

## 2018-07-21 DIAGNOSIS — N2581 Secondary hyperparathyroidism of renal origin: Secondary | ICD-10-CM | POA: Diagnosis not present

## 2018-07-21 DIAGNOSIS — D631 Anemia in chronic kidney disease: Secondary | ICD-10-CM | POA: Diagnosis not present

## 2018-07-23 DIAGNOSIS — N186 End stage renal disease: Secondary | ICD-10-CM | POA: Diagnosis not present

## 2018-07-23 DIAGNOSIS — D631 Anemia in chronic kidney disease: Secondary | ICD-10-CM | POA: Diagnosis not present

## 2018-07-23 DIAGNOSIS — N2581 Secondary hyperparathyroidism of renal origin: Secondary | ICD-10-CM | POA: Diagnosis not present

## 2018-07-25 DIAGNOSIS — D631 Anemia in chronic kidney disease: Secondary | ICD-10-CM | POA: Diagnosis not present

## 2018-07-25 DIAGNOSIS — N2581 Secondary hyperparathyroidism of renal origin: Secondary | ICD-10-CM | POA: Diagnosis not present

## 2018-07-25 DIAGNOSIS — N186 End stage renal disease: Secondary | ICD-10-CM | POA: Diagnosis not present

## 2018-07-28 DIAGNOSIS — N2581 Secondary hyperparathyroidism of renal origin: Secondary | ICD-10-CM | POA: Diagnosis not present

## 2018-07-28 DIAGNOSIS — D631 Anemia in chronic kidney disease: Secondary | ICD-10-CM | POA: Diagnosis not present

## 2018-07-28 DIAGNOSIS — N186 End stage renal disease: Secondary | ICD-10-CM | POA: Diagnosis not present

## 2018-07-30 DIAGNOSIS — N2581 Secondary hyperparathyroidism of renal origin: Secondary | ICD-10-CM | POA: Diagnosis not present

## 2018-07-30 DIAGNOSIS — D631 Anemia in chronic kidney disease: Secondary | ICD-10-CM | POA: Diagnosis not present

## 2018-07-30 DIAGNOSIS — N186 End stage renal disease: Secondary | ICD-10-CM | POA: Diagnosis not present

## 2018-08-01 DIAGNOSIS — N186 End stage renal disease: Secondary | ICD-10-CM | POA: Diagnosis not present

## 2018-08-01 DIAGNOSIS — N2581 Secondary hyperparathyroidism of renal origin: Secondary | ICD-10-CM | POA: Diagnosis not present

## 2018-08-01 DIAGNOSIS — D631 Anemia in chronic kidney disease: Secondary | ICD-10-CM | POA: Diagnosis not present

## 2018-08-03 DIAGNOSIS — Z992 Dependence on renal dialysis: Secondary | ICD-10-CM | POA: Diagnosis not present

## 2018-08-03 DIAGNOSIS — I129 Hypertensive chronic kidney disease with stage 1 through stage 4 chronic kidney disease, or unspecified chronic kidney disease: Secondary | ICD-10-CM | POA: Diagnosis not present

## 2018-08-03 DIAGNOSIS — N186 End stage renal disease: Secondary | ICD-10-CM | POA: Diagnosis not present

## 2018-08-04 DIAGNOSIS — D631 Anemia in chronic kidney disease: Secondary | ICD-10-CM | POA: Diagnosis not present

## 2018-08-04 DIAGNOSIS — N2581 Secondary hyperparathyroidism of renal origin: Secondary | ICD-10-CM | POA: Diagnosis not present

## 2018-08-04 DIAGNOSIS — D509 Iron deficiency anemia, unspecified: Secondary | ICD-10-CM | POA: Diagnosis not present

## 2018-08-04 DIAGNOSIS — N186 End stage renal disease: Secondary | ICD-10-CM | POA: Diagnosis not present

## 2018-08-06 DIAGNOSIS — N186 End stage renal disease: Secondary | ICD-10-CM | POA: Diagnosis not present

## 2018-08-06 DIAGNOSIS — N2581 Secondary hyperparathyroidism of renal origin: Secondary | ICD-10-CM | POA: Diagnosis not present

## 2018-08-06 DIAGNOSIS — D631 Anemia in chronic kidney disease: Secondary | ICD-10-CM | POA: Diagnosis not present

## 2018-08-06 DIAGNOSIS — D509 Iron deficiency anemia, unspecified: Secondary | ICD-10-CM | POA: Diagnosis not present

## 2018-08-08 DIAGNOSIS — D631 Anemia in chronic kidney disease: Secondary | ICD-10-CM | POA: Diagnosis not present

## 2018-08-08 DIAGNOSIS — N2581 Secondary hyperparathyroidism of renal origin: Secondary | ICD-10-CM | POA: Diagnosis not present

## 2018-08-08 DIAGNOSIS — N186 End stage renal disease: Secondary | ICD-10-CM | POA: Diagnosis not present

## 2018-08-08 DIAGNOSIS — D509 Iron deficiency anemia, unspecified: Secondary | ICD-10-CM | POA: Diagnosis not present

## 2018-08-11 DIAGNOSIS — N2581 Secondary hyperparathyroidism of renal origin: Secondary | ICD-10-CM | POA: Diagnosis not present

## 2018-08-11 DIAGNOSIS — N186 End stage renal disease: Secondary | ICD-10-CM | POA: Diagnosis not present

## 2018-08-11 DIAGNOSIS — D631 Anemia in chronic kidney disease: Secondary | ICD-10-CM | POA: Diagnosis not present

## 2018-08-11 DIAGNOSIS — D509 Iron deficiency anemia, unspecified: Secondary | ICD-10-CM | POA: Diagnosis not present

## 2018-08-13 DIAGNOSIS — D631 Anemia in chronic kidney disease: Secondary | ICD-10-CM | POA: Diagnosis not present

## 2018-08-13 DIAGNOSIS — N186 End stage renal disease: Secondary | ICD-10-CM | POA: Diagnosis not present

## 2018-08-13 DIAGNOSIS — D509 Iron deficiency anemia, unspecified: Secondary | ICD-10-CM | POA: Diagnosis not present

## 2018-08-13 DIAGNOSIS — N2581 Secondary hyperparathyroidism of renal origin: Secondary | ICD-10-CM | POA: Diagnosis not present

## 2018-08-15 DIAGNOSIS — D631 Anemia in chronic kidney disease: Secondary | ICD-10-CM | POA: Diagnosis not present

## 2018-08-15 DIAGNOSIS — D509 Iron deficiency anemia, unspecified: Secondary | ICD-10-CM | POA: Diagnosis not present

## 2018-08-15 DIAGNOSIS — N186 End stage renal disease: Secondary | ICD-10-CM | POA: Diagnosis not present

## 2018-08-15 DIAGNOSIS — N2581 Secondary hyperparathyroidism of renal origin: Secondary | ICD-10-CM | POA: Diagnosis not present

## 2018-08-18 DIAGNOSIS — D631 Anemia in chronic kidney disease: Secondary | ICD-10-CM | POA: Diagnosis not present

## 2018-08-18 DIAGNOSIS — N2581 Secondary hyperparathyroidism of renal origin: Secondary | ICD-10-CM | POA: Diagnosis not present

## 2018-08-18 DIAGNOSIS — N186 End stage renal disease: Secondary | ICD-10-CM | POA: Diagnosis not present

## 2018-08-18 DIAGNOSIS — D509 Iron deficiency anemia, unspecified: Secondary | ICD-10-CM | POA: Diagnosis not present

## 2018-08-19 ENCOUNTER — Telehealth (INDEPENDENT_AMBULATORY_CARE_PROVIDER_SITE_OTHER): Payer: Self-pay

## 2018-08-19 ENCOUNTER — Ambulatory Visit (INDEPENDENT_AMBULATORY_CARE_PROVIDER_SITE_OTHER): Payer: Medicare Other

## 2018-08-19 ENCOUNTER — Ambulatory Visit (INDEPENDENT_AMBULATORY_CARE_PROVIDER_SITE_OTHER): Payer: Medicare Other | Admitting: Orthopaedic Surgery

## 2018-08-19 DIAGNOSIS — M25511 Pain in right shoulder: Secondary | ICD-10-CM

## 2018-08-19 MED ORDER — BUPIVACAINE HCL 0.5 % IJ SOLN
3.0000 mL | INTRAMUSCULAR | Status: AC | PRN
  Administered 2018-08-19: 3 mL via INTRA_ARTICULAR

## 2018-08-19 MED ORDER — LIDOCAINE HCL 1 % IJ SOLN
3.0000 mL | INTRAMUSCULAR | Status: AC | PRN
  Administered 2018-08-19: 3 mL

## 2018-08-19 MED ORDER — METHYLPREDNISOLONE ACETATE 40 MG/ML IJ SUSP
40.0000 mg | INTRAMUSCULAR | Status: AC | PRN
  Administered 2018-08-19: 40 mg via INTRA_ARTICULAR

## 2018-08-19 NOTE — Telephone Encounter (Signed)
I was cleaning room and found an earpiece. This patient was the last one in the room Called patient to let him know no answer LMOM to return our call.

## 2018-08-19 NOTE — Progress Notes (Addendum)
Office Visit Note   Patient: Lonnie [name]           Date of Birth: 12/24/1924           MRN: [id number] Visit Date: 08/19/2018              Requested by: Prince Solian, MD [address] [place], [place] [address] PCP: Prince Solian, MD   Assessment & Plan: Visit Diagnoses:  1. Right shoulder pain, unspecified chronicity     Plan: Based on x-rays and clinical findings I think he is had acute worsening of his rotator cuff arthropathy.  He likely has a chronically torn rotator cuff.  Subacromial injection performed today in hopes of giving him some relief.  Home exercises were provided today which the patient will try.  Follow-up as needed.  Follow-Up Instructions: Return if symptoms worsen or fail to improve.   Orders:  Orders Placed This Encounter  Procedures  . Large Joint Inj: R subacromial bursa  . XR Shoulder Right   Meds ordered this encounter  Medications  . bupivacaine (MARCAINE) 0.5 % (with pres) injection 3 mL  . lidocaine (XYLOCAINE) 1 % (with pres) injection 3 mL  . methylPREDNISolone acetate (DEPO-MEDROL) injection 40 mg      Procedures: Large Joint Inj: R subacromial bursa on 08/19/2018 4:10 PM Indications: pain Details: 22 G needle  Arthrogram: No  Medications: 3 mL lidocaine 1 %; 3 mL bupivacaine 0.5 %; 40 mg methylPREDNISolone acetate 40 MG/ML Outcome: tolerated well, no immediate complications Consent was given by the patient. Patient was prepped and draped in the usual sterile fashion.       Clinical Data: No additional findings.   Subjective: Chief Complaint  Patient presents with  . Right Arm - Pain    Lonnie [name] comes in now with right shoulder pain acutely.  He states that he cannot lift his arm.  Denies any injuries.  Denies any radicular symptoms.   Review of Systems  Constitutional: Negative.   All other systems reviewed and are negative.    Objective: Vital Signs: There were no vitals taken for this visit.  Physical  Exam  Constitutional: He is oriented to person, place, and time. He appears well-developed and well-nourished.  Pulmonary/Chest: Effort normal.  Abdominal: Soft.  Neurological: He is alert and oriented to person, place, and time.  Skin: Skin is warm.  Psychiatric: He has a normal mood and affect. His behavior is normal. Judgment and thought content normal.  Nursing note and vitals reviewed.   Ortho Exam Right shoulder exam shows positive drop arm test.  He is unable to actively elevate his arm above the level of the shoulder. Specialty Comments:  No specialty comments available.  Imaging: Xr Shoulder Right  Result Date: 08/19/2018 Mildly high riding humeral head concerning for rotator cuff arthropathy.    PMFS History: Patient Active Problem List   Diagnosis Date Noted  . ESRD on dialysis ([facility]) 08/24/2016  . Monoclonal gammopathy 10/06/2015  . Gammopathy, monoclonal   . Polyclonal gammopathy determined by serum protein electrophoresis 10/04/2015  . Depression 03/29/2015  . Intertrigo 05/30/2014  . Proteinuria 05/05/2014   Past Medical History:  Diagnosis Date  . Adenomatous polyps   . Bursitis    left hip  . Chronic kidney disease    Stage 4  . Frequent urination at night   . GERD (gastroesophageal reflux disease)   . Hypertension   . Hypothyroidism   . Pneumonia   . Polyclonal gammopathy determined by  serum protein electrophoresis 10/04/2015    Family History  Problem Relation Age of Onset  . Diabetes Father   . Diabetes Brother   . Heart disease Brother   . Colon cancer Neg Hx   . Rectal cancer Neg Hx     Past Surgical History:  Procedure Laterality Date  . AORTIC ARCH ANGIOGRAPHY N/A 08/01/2017   Procedure: Aortic Arch Angiography;  Surgeon: Conrad Paramount, MD;  Location: [facility];  Service: Cardiovascular;  Laterality: N/A;  . AV FISTULA PLACEMENT Left 07/10/2016   Procedure: LEFT BRACHIOCEPHALIC ARTERIOVENOUS (AV) FISTULA CREATION;  Surgeon: Conrad Williston, MD;  Location: [facility];  Service: Vascular;  Laterality: Left;  . COLONOSCOPY    . RENAL BIOPSY Left 10/06/2015  . REVISON OF ARTERIOVENOUS FISTULA Left 74/71/5953   Procedure: PLICATION OF LEFT BRACHIOCEPHALIC ARTERIOVENOUS FISTULA;  Surgeon: Conrad [place], MD;  Location: [facility];  Service: Vascular;  Laterality: Left;  . SKIN SURGERY     back  . UPPER EXTREMITY ANGIOGRAPHY Left 08/01/2017   Procedure: Upper Extremity Angiography;  Surgeon: Conrad [place], MD;  Location: [facility];  Service: Cardiovascular;  Laterality: Left;   Social History   Occupational History  . Not on file  Tobacco Use  . Smoking status: Former Smoker    Last attempt to quit: 01/25/1980    Years since quitting: 38.5  . Smokeless tobacco: Never Used  Substance and Sexual Activity  . Alcohol use: No    Comment: former alcoholic 32 years ago was last drink  . Drug use: No  . Sexual activity: Not on file

## 2018-08-20 ENCOUNTER — Telehealth (INDEPENDENT_AMBULATORY_CARE_PROVIDER_SITE_OTHER): Payer: Self-pay | Admitting: Orthopaedic Surgery

## 2018-08-20 DIAGNOSIS — N2581 Secondary hyperparathyroidism of renal origin: Secondary | ICD-10-CM | POA: Diagnosis not present

## 2018-08-20 DIAGNOSIS — N186 End stage renal disease: Secondary | ICD-10-CM | POA: Diagnosis not present

## 2018-08-20 DIAGNOSIS — D509 Iron deficiency anemia, unspecified: Secondary | ICD-10-CM | POA: Diagnosis not present

## 2018-08-20 DIAGNOSIS — D631 Anemia in chronic kidney disease: Secondary | ICD-10-CM | POA: Diagnosis not present

## 2018-08-20 NOTE — Telephone Encounter (Signed)
Returned call to patient left message to call back. 

## 2018-08-22 DIAGNOSIS — D509 Iron deficiency anemia, unspecified: Secondary | ICD-10-CM | POA: Diagnosis not present

## 2018-08-22 DIAGNOSIS — D631 Anemia in chronic kidney disease: Secondary | ICD-10-CM | POA: Diagnosis not present

## 2018-08-22 DIAGNOSIS — N2581 Secondary hyperparathyroidism of renal origin: Secondary | ICD-10-CM | POA: Diagnosis not present

## 2018-08-22 DIAGNOSIS — N186 End stage renal disease: Secondary | ICD-10-CM | POA: Diagnosis not present

## 2018-08-25 DIAGNOSIS — D509 Iron deficiency anemia, unspecified: Secondary | ICD-10-CM | POA: Diagnosis not present

## 2018-08-25 DIAGNOSIS — N186 End stage renal disease: Secondary | ICD-10-CM | POA: Diagnosis not present

## 2018-08-25 DIAGNOSIS — N2581 Secondary hyperparathyroidism of renal origin: Secondary | ICD-10-CM | POA: Diagnosis not present

## 2018-08-25 DIAGNOSIS — D631 Anemia in chronic kidney disease: Secondary | ICD-10-CM | POA: Diagnosis not present

## 2018-08-27 DIAGNOSIS — D631 Anemia in chronic kidney disease: Secondary | ICD-10-CM | POA: Diagnosis not present

## 2018-08-27 DIAGNOSIS — N186 End stage renal disease: Secondary | ICD-10-CM | POA: Diagnosis not present

## 2018-08-27 DIAGNOSIS — N2581 Secondary hyperparathyroidism of renal origin: Secondary | ICD-10-CM | POA: Diagnosis not present

## 2018-08-27 DIAGNOSIS — D509 Iron deficiency anemia, unspecified: Secondary | ICD-10-CM | POA: Diagnosis not present

## 2018-08-29 DIAGNOSIS — D631 Anemia in chronic kidney disease: Secondary | ICD-10-CM | POA: Diagnosis not present

## 2018-08-29 DIAGNOSIS — N186 End stage renal disease: Secondary | ICD-10-CM | POA: Diagnosis not present

## 2018-08-29 DIAGNOSIS — N2581 Secondary hyperparathyroidism of renal origin: Secondary | ICD-10-CM | POA: Diagnosis not present

## 2018-08-29 DIAGNOSIS — D509 Iron deficiency anemia, unspecified: Secondary | ICD-10-CM | POA: Diagnosis not present

## 2018-09-01 DIAGNOSIS — D631 Anemia in chronic kidney disease: Secondary | ICD-10-CM | POA: Diagnosis not present

## 2018-09-01 DIAGNOSIS — D509 Iron deficiency anemia, unspecified: Secondary | ICD-10-CM | POA: Diagnosis not present

## 2018-09-01 DIAGNOSIS — N186 End stage renal disease: Secondary | ICD-10-CM | POA: Diagnosis not present

## 2018-09-01 DIAGNOSIS — N2581 Secondary hyperparathyroidism of renal origin: Secondary | ICD-10-CM | POA: Diagnosis not present

## 2018-09-02 DIAGNOSIS — N186 End stage renal disease: Secondary | ICD-10-CM | POA: Diagnosis not present

## 2018-09-02 DIAGNOSIS — Z992 Dependence on renal dialysis: Secondary | ICD-10-CM | POA: Diagnosis not present

## 2018-09-02 DIAGNOSIS — I129 Hypertensive chronic kidney disease with stage 1 through stage 4 chronic kidney disease, or unspecified chronic kidney disease: Secondary | ICD-10-CM | POA: Diagnosis not present

## 2018-09-03 DIAGNOSIS — D631 Anemia in chronic kidney disease: Secondary | ICD-10-CM | POA: Diagnosis not present

## 2018-09-03 DIAGNOSIS — Z23 Encounter for immunization: Secondary | ICD-10-CM | POA: Diagnosis not present

## 2018-09-03 DIAGNOSIS — N186 End stage renal disease: Secondary | ICD-10-CM | POA: Diagnosis not present

## 2018-09-03 DIAGNOSIS — N2581 Secondary hyperparathyroidism of renal origin: Secondary | ICD-10-CM | POA: Diagnosis not present

## 2018-09-03 DIAGNOSIS — D509 Iron deficiency anemia, unspecified: Secondary | ICD-10-CM | POA: Diagnosis not present

## 2018-09-05 DIAGNOSIS — Z23 Encounter for immunization: Secondary | ICD-10-CM | POA: Diagnosis not present

## 2018-09-05 DIAGNOSIS — N2581 Secondary hyperparathyroidism of renal origin: Secondary | ICD-10-CM | POA: Diagnosis not present

## 2018-09-05 DIAGNOSIS — D631 Anemia in chronic kidney disease: Secondary | ICD-10-CM | POA: Diagnosis not present

## 2018-09-05 DIAGNOSIS — N186 End stage renal disease: Secondary | ICD-10-CM | POA: Diagnosis not present

## 2018-09-05 DIAGNOSIS — D509 Iron deficiency anemia, unspecified: Secondary | ICD-10-CM | POA: Diagnosis not present

## 2018-09-08 DIAGNOSIS — N2581 Secondary hyperparathyroidism of renal origin: Secondary | ICD-10-CM | POA: Diagnosis not present

## 2018-09-08 DIAGNOSIS — Z23 Encounter for immunization: Secondary | ICD-10-CM | POA: Diagnosis not present

## 2018-09-08 DIAGNOSIS — D509 Iron deficiency anemia, unspecified: Secondary | ICD-10-CM | POA: Diagnosis not present

## 2018-09-08 DIAGNOSIS — D631 Anemia in chronic kidney disease: Secondary | ICD-10-CM | POA: Diagnosis not present

## 2018-09-08 DIAGNOSIS — N186 End stage renal disease: Secondary | ICD-10-CM | POA: Diagnosis not present

## 2018-09-10 DIAGNOSIS — N2581 Secondary hyperparathyroidism of renal origin: Secondary | ICD-10-CM | POA: Diagnosis not present

## 2018-09-10 DIAGNOSIS — Z23 Encounter for immunization: Secondary | ICD-10-CM | POA: Diagnosis not present

## 2018-09-10 DIAGNOSIS — D509 Iron deficiency anemia, unspecified: Secondary | ICD-10-CM | POA: Diagnosis not present

## 2018-09-10 DIAGNOSIS — N186 End stage renal disease: Secondary | ICD-10-CM | POA: Diagnosis not present

## 2018-09-10 DIAGNOSIS — D631 Anemia in chronic kidney disease: Secondary | ICD-10-CM | POA: Diagnosis not present

## 2018-09-12 DIAGNOSIS — N186 End stage renal disease: Secondary | ICD-10-CM | POA: Diagnosis not present

## 2018-09-12 DIAGNOSIS — N2581 Secondary hyperparathyroidism of renal origin: Secondary | ICD-10-CM | POA: Diagnosis not present

## 2018-09-12 DIAGNOSIS — Z23 Encounter for immunization: Secondary | ICD-10-CM | POA: Diagnosis not present

## 2018-09-12 DIAGNOSIS — D631 Anemia in chronic kidney disease: Secondary | ICD-10-CM | POA: Diagnosis not present

## 2018-09-12 DIAGNOSIS — D509 Iron deficiency anemia, unspecified: Secondary | ICD-10-CM | POA: Diagnosis not present

## 2018-09-15 DIAGNOSIS — D631 Anemia in chronic kidney disease: Secondary | ICD-10-CM | POA: Diagnosis not present

## 2018-09-15 DIAGNOSIS — N186 End stage renal disease: Secondary | ICD-10-CM | POA: Diagnosis not present

## 2018-09-15 DIAGNOSIS — D509 Iron deficiency anemia, unspecified: Secondary | ICD-10-CM | POA: Diagnosis not present

## 2018-09-15 DIAGNOSIS — N2581 Secondary hyperparathyroidism of renal origin: Secondary | ICD-10-CM | POA: Diagnosis not present

## 2018-09-15 DIAGNOSIS — Z23 Encounter for immunization: Secondary | ICD-10-CM | POA: Diagnosis not present

## 2018-09-17 DIAGNOSIS — N186 End stage renal disease: Secondary | ICD-10-CM | POA: Diagnosis not present

## 2018-09-17 DIAGNOSIS — Z23 Encounter for immunization: Secondary | ICD-10-CM | POA: Diagnosis not present

## 2018-09-17 DIAGNOSIS — D631 Anemia in chronic kidney disease: Secondary | ICD-10-CM | POA: Diagnosis not present

## 2018-09-17 DIAGNOSIS — D509 Iron deficiency anemia, unspecified: Secondary | ICD-10-CM | POA: Diagnosis not present

## 2018-09-17 DIAGNOSIS — N2581 Secondary hyperparathyroidism of renal origin: Secondary | ICD-10-CM | POA: Diagnosis not present

## 2018-09-19 DIAGNOSIS — Z23 Encounter for immunization: Secondary | ICD-10-CM | POA: Diagnosis not present

## 2018-09-19 DIAGNOSIS — N2581 Secondary hyperparathyroidism of renal origin: Secondary | ICD-10-CM | POA: Diagnosis not present

## 2018-09-19 DIAGNOSIS — D631 Anemia in chronic kidney disease: Secondary | ICD-10-CM | POA: Diagnosis not present

## 2018-09-19 DIAGNOSIS — N186 End stage renal disease: Secondary | ICD-10-CM | POA: Diagnosis not present

## 2018-09-19 DIAGNOSIS — D509 Iron deficiency anemia, unspecified: Secondary | ICD-10-CM | POA: Diagnosis not present

## 2018-09-22 DIAGNOSIS — N186 End stage renal disease: Secondary | ICD-10-CM | POA: Diagnosis not present

## 2018-09-22 DIAGNOSIS — D631 Anemia in chronic kidney disease: Secondary | ICD-10-CM | POA: Diagnosis not present

## 2018-09-22 DIAGNOSIS — D509 Iron deficiency anemia, unspecified: Secondary | ICD-10-CM | POA: Diagnosis not present

## 2018-09-22 DIAGNOSIS — Z23 Encounter for immunization: Secondary | ICD-10-CM | POA: Diagnosis not present

## 2018-09-22 DIAGNOSIS — N2581 Secondary hyperparathyroidism of renal origin: Secondary | ICD-10-CM | POA: Diagnosis not present

## 2018-09-24 DIAGNOSIS — N2581 Secondary hyperparathyroidism of renal origin: Secondary | ICD-10-CM | POA: Diagnosis not present

## 2018-09-24 DIAGNOSIS — D631 Anemia in chronic kidney disease: Secondary | ICD-10-CM | POA: Diagnosis not present

## 2018-09-24 DIAGNOSIS — Z23 Encounter for immunization: Secondary | ICD-10-CM | POA: Diagnosis not present

## 2018-09-24 DIAGNOSIS — D509 Iron deficiency anemia, unspecified: Secondary | ICD-10-CM | POA: Diagnosis not present

## 2018-09-24 DIAGNOSIS — N186 End stage renal disease: Secondary | ICD-10-CM | POA: Diagnosis not present

## 2018-09-26 DIAGNOSIS — Z23 Encounter for immunization: Secondary | ICD-10-CM | POA: Diagnosis not present

## 2018-09-26 DIAGNOSIS — N2581 Secondary hyperparathyroidism of renal origin: Secondary | ICD-10-CM | POA: Diagnosis not present

## 2018-09-26 DIAGNOSIS — D631 Anemia in chronic kidney disease: Secondary | ICD-10-CM | POA: Diagnosis not present

## 2018-09-26 DIAGNOSIS — N186 End stage renal disease: Secondary | ICD-10-CM | POA: Diagnosis not present

## 2018-09-26 DIAGNOSIS — D509 Iron deficiency anemia, unspecified: Secondary | ICD-10-CM | POA: Diagnosis not present

## 2018-09-29 DIAGNOSIS — N2581 Secondary hyperparathyroidism of renal origin: Secondary | ICD-10-CM | POA: Diagnosis not present

## 2018-09-29 DIAGNOSIS — Z23 Encounter for immunization: Secondary | ICD-10-CM | POA: Diagnosis not present

## 2018-09-29 DIAGNOSIS — D509 Iron deficiency anemia, unspecified: Secondary | ICD-10-CM | POA: Diagnosis not present

## 2018-09-29 DIAGNOSIS — N186 End stage renal disease: Secondary | ICD-10-CM | POA: Diagnosis not present

## 2018-09-29 DIAGNOSIS — D631 Anemia in chronic kidney disease: Secondary | ICD-10-CM | POA: Diagnosis not present

## 2018-10-01 DIAGNOSIS — N2581 Secondary hyperparathyroidism of renal origin: Secondary | ICD-10-CM | POA: Diagnosis not present

## 2018-10-01 DIAGNOSIS — Z23 Encounter for immunization: Secondary | ICD-10-CM | POA: Diagnosis not present

## 2018-10-01 DIAGNOSIS — D509 Iron deficiency anemia, unspecified: Secondary | ICD-10-CM | POA: Diagnosis not present

## 2018-10-01 DIAGNOSIS — D631 Anemia in chronic kidney disease: Secondary | ICD-10-CM | POA: Diagnosis not present

## 2018-10-01 DIAGNOSIS — N186 End stage renal disease: Secondary | ICD-10-CM | POA: Diagnosis not present

## 2018-10-03 DIAGNOSIS — I129 Hypertensive chronic kidney disease with stage 1 through stage 4 chronic kidney disease, or unspecified chronic kidney disease: Secondary | ICD-10-CM | POA: Diagnosis not present

## 2018-10-03 DIAGNOSIS — D631 Anemia in chronic kidney disease: Secondary | ICD-10-CM | POA: Diagnosis not present

## 2018-10-03 DIAGNOSIS — Z992 Dependence on renal dialysis: Secondary | ICD-10-CM | POA: Diagnosis not present

## 2018-10-03 DIAGNOSIS — N2581 Secondary hyperparathyroidism of renal origin: Secondary | ICD-10-CM | POA: Diagnosis not present

## 2018-10-03 DIAGNOSIS — N186 End stage renal disease: Secondary | ICD-10-CM | POA: Diagnosis not present

## 2018-10-03 DIAGNOSIS — D509 Iron deficiency anemia, unspecified: Secondary | ICD-10-CM | POA: Diagnosis not present

## 2018-10-06 DIAGNOSIS — N2581 Secondary hyperparathyroidism of renal origin: Secondary | ICD-10-CM | POA: Diagnosis not present

## 2018-10-06 DIAGNOSIS — D509 Iron deficiency anemia, unspecified: Secondary | ICD-10-CM | POA: Diagnosis not present

## 2018-10-06 DIAGNOSIS — N186 End stage renal disease: Secondary | ICD-10-CM | POA: Diagnosis not present

## 2018-10-06 DIAGNOSIS — D631 Anemia in chronic kidney disease: Secondary | ICD-10-CM | POA: Diagnosis not present

## 2018-10-08 DIAGNOSIS — N2581 Secondary hyperparathyroidism of renal origin: Secondary | ICD-10-CM | POA: Diagnosis not present

## 2018-10-08 DIAGNOSIS — D631 Anemia in chronic kidney disease: Secondary | ICD-10-CM | POA: Diagnosis not present

## 2018-10-08 DIAGNOSIS — N186 End stage renal disease: Secondary | ICD-10-CM | POA: Diagnosis not present

## 2018-10-08 DIAGNOSIS — D509 Iron deficiency anemia, unspecified: Secondary | ICD-10-CM | POA: Diagnosis not present

## 2018-10-10 DIAGNOSIS — D631 Anemia in chronic kidney disease: Secondary | ICD-10-CM | POA: Diagnosis not present

## 2018-10-10 DIAGNOSIS — N186 End stage renal disease: Secondary | ICD-10-CM | POA: Diagnosis not present

## 2018-10-10 DIAGNOSIS — D509 Iron deficiency anemia, unspecified: Secondary | ICD-10-CM | POA: Diagnosis not present

## 2018-10-10 DIAGNOSIS — N2581 Secondary hyperparathyroidism of renal origin: Secondary | ICD-10-CM | POA: Diagnosis not present

## 2018-10-13 DIAGNOSIS — D631 Anemia in chronic kidney disease: Secondary | ICD-10-CM | POA: Diagnosis not present

## 2018-10-13 DIAGNOSIS — N2581 Secondary hyperparathyroidism of renal origin: Secondary | ICD-10-CM | POA: Diagnosis not present

## 2018-10-13 DIAGNOSIS — D509 Iron deficiency anemia, unspecified: Secondary | ICD-10-CM | POA: Diagnosis not present

## 2018-10-13 DIAGNOSIS — N186 End stage renal disease: Secondary | ICD-10-CM | POA: Diagnosis not present

## 2018-10-15 DIAGNOSIS — N186 End stage renal disease: Secondary | ICD-10-CM | POA: Diagnosis not present

## 2018-10-15 DIAGNOSIS — D509 Iron deficiency anemia, unspecified: Secondary | ICD-10-CM | POA: Diagnosis not present

## 2018-10-15 DIAGNOSIS — D631 Anemia in chronic kidney disease: Secondary | ICD-10-CM | POA: Diagnosis not present

## 2018-10-15 DIAGNOSIS — N2581 Secondary hyperparathyroidism of renal origin: Secondary | ICD-10-CM | POA: Diagnosis not present

## 2018-10-17 DIAGNOSIS — N186 End stage renal disease: Secondary | ICD-10-CM | POA: Diagnosis not present

## 2018-10-17 DIAGNOSIS — D631 Anemia in chronic kidney disease: Secondary | ICD-10-CM | POA: Diagnosis not present

## 2018-10-17 DIAGNOSIS — D509 Iron deficiency anemia, unspecified: Secondary | ICD-10-CM | POA: Diagnosis not present

## 2018-10-17 DIAGNOSIS — N2581 Secondary hyperparathyroidism of renal origin: Secondary | ICD-10-CM | POA: Diagnosis not present

## 2018-10-20 DIAGNOSIS — N186 End stage renal disease: Secondary | ICD-10-CM | POA: Diagnosis not present

## 2018-10-20 DIAGNOSIS — N2581 Secondary hyperparathyroidism of renal origin: Secondary | ICD-10-CM | POA: Diagnosis not present

## 2018-10-20 DIAGNOSIS — D631 Anemia in chronic kidney disease: Secondary | ICD-10-CM | POA: Diagnosis not present

## 2018-10-20 DIAGNOSIS — D509 Iron deficiency anemia, unspecified: Secondary | ICD-10-CM | POA: Diagnosis not present

## 2018-10-22 DIAGNOSIS — D509 Iron deficiency anemia, unspecified: Secondary | ICD-10-CM | POA: Diagnosis not present

## 2018-10-22 DIAGNOSIS — N186 End stage renal disease: Secondary | ICD-10-CM | POA: Diagnosis not present

## 2018-10-22 DIAGNOSIS — D631 Anemia in chronic kidney disease: Secondary | ICD-10-CM | POA: Diagnosis not present

## 2018-10-22 DIAGNOSIS — N2581 Secondary hyperparathyroidism of renal origin: Secondary | ICD-10-CM | POA: Diagnosis not present

## 2018-10-24 DIAGNOSIS — N2581 Secondary hyperparathyroidism of renal origin: Secondary | ICD-10-CM | POA: Diagnosis not present

## 2018-10-24 DIAGNOSIS — D631 Anemia in chronic kidney disease: Secondary | ICD-10-CM | POA: Diagnosis not present

## 2018-10-24 DIAGNOSIS — D509 Iron deficiency anemia, unspecified: Secondary | ICD-10-CM | POA: Diagnosis not present

## 2018-10-24 DIAGNOSIS — N186 End stage renal disease: Secondary | ICD-10-CM | POA: Diagnosis not present

## 2018-10-26 DIAGNOSIS — N186 End stage renal disease: Secondary | ICD-10-CM | POA: Diagnosis not present

## 2018-10-26 DIAGNOSIS — N2581 Secondary hyperparathyroidism of renal origin: Secondary | ICD-10-CM | POA: Diagnosis not present

## 2018-10-26 DIAGNOSIS — D631 Anemia in chronic kidney disease: Secondary | ICD-10-CM | POA: Diagnosis not present

## 2018-10-26 DIAGNOSIS — D509 Iron deficiency anemia, unspecified: Secondary | ICD-10-CM | POA: Diagnosis not present

## 2018-10-28 DIAGNOSIS — N186 End stage renal disease: Secondary | ICD-10-CM | POA: Diagnosis not present

## 2018-10-28 DIAGNOSIS — D631 Anemia in chronic kidney disease: Secondary | ICD-10-CM | POA: Diagnosis not present

## 2018-10-28 DIAGNOSIS — D509 Iron deficiency anemia, unspecified: Secondary | ICD-10-CM | POA: Diagnosis not present

## 2018-10-28 DIAGNOSIS — N2581 Secondary hyperparathyroidism of renal origin: Secondary | ICD-10-CM | POA: Diagnosis not present

## 2018-10-31 DIAGNOSIS — N2581 Secondary hyperparathyroidism of renal origin: Secondary | ICD-10-CM | POA: Diagnosis not present

## 2018-10-31 DIAGNOSIS — N186 End stage renal disease: Secondary | ICD-10-CM | POA: Diagnosis not present

## 2018-10-31 DIAGNOSIS — D509 Iron deficiency anemia, unspecified: Secondary | ICD-10-CM | POA: Diagnosis not present

## 2018-10-31 DIAGNOSIS — D631 Anemia in chronic kidney disease: Secondary | ICD-10-CM | POA: Diagnosis not present

## 2018-11-02 DIAGNOSIS — N186 End stage renal disease: Secondary | ICD-10-CM | POA: Diagnosis not present

## 2018-11-02 DIAGNOSIS — Z992 Dependence on renal dialysis: Secondary | ICD-10-CM | POA: Diagnosis not present

## 2018-11-02 DIAGNOSIS — I129 Hypertensive chronic kidney disease with stage 1 through stage 4 chronic kidney disease, or unspecified chronic kidney disease: Secondary | ICD-10-CM | POA: Diagnosis not present

## 2018-11-03 DIAGNOSIS — D509 Iron deficiency anemia, unspecified: Secondary | ICD-10-CM | POA: Diagnosis not present

## 2018-11-03 DIAGNOSIS — D631 Anemia in chronic kidney disease: Secondary | ICD-10-CM | POA: Diagnosis not present

## 2018-11-03 DIAGNOSIS — N186 End stage renal disease: Secondary | ICD-10-CM | POA: Diagnosis not present

## 2018-11-03 DIAGNOSIS — N2581 Secondary hyperparathyroidism of renal origin: Secondary | ICD-10-CM | POA: Diagnosis not present

## 2018-11-05 DIAGNOSIS — D509 Iron deficiency anemia, unspecified: Secondary | ICD-10-CM | POA: Diagnosis not present

## 2018-11-05 DIAGNOSIS — N2581 Secondary hyperparathyroidism of renal origin: Secondary | ICD-10-CM | POA: Diagnosis not present

## 2018-11-05 DIAGNOSIS — D631 Anemia in chronic kidney disease: Secondary | ICD-10-CM | POA: Diagnosis not present

## 2018-11-05 DIAGNOSIS — N186 End stage renal disease: Secondary | ICD-10-CM | POA: Diagnosis not present

## 2018-11-07 DIAGNOSIS — N2581 Secondary hyperparathyroidism of renal origin: Secondary | ICD-10-CM | POA: Diagnosis not present

## 2018-11-07 DIAGNOSIS — D509 Iron deficiency anemia, unspecified: Secondary | ICD-10-CM | POA: Diagnosis not present

## 2018-11-07 DIAGNOSIS — N186 End stage renal disease: Secondary | ICD-10-CM | POA: Diagnosis not present

## 2018-11-07 DIAGNOSIS — D631 Anemia in chronic kidney disease: Secondary | ICD-10-CM | POA: Diagnosis not present

## 2018-11-10 DIAGNOSIS — D509 Iron deficiency anemia, unspecified: Secondary | ICD-10-CM | POA: Diagnosis not present

## 2018-11-10 DIAGNOSIS — D631 Anemia in chronic kidney disease: Secondary | ICD-10-CM | POA: Diagnosis not present

## 2018-11-10 DIAGNOSIS — N186 End stage renal disease: Secondary | ICD-10-CM | POA: Diagnosis not present

## 2018-11-10 DIAGNOSIS — N2581 Secondary hyperparathyroidism of renal origin: Secondary | ICD-10-CM | POA: Diagnosis not present

## 2018-11-12 DIAGNOSIS — N186 End stage renal disease: Secondary | ICD-10-CM | POA: Diagnosis not present

## 2018-11-12 DIAGNOSIS — N2581 Secondary hyperparathyroidism of renal origin: Secondary | ICD-10-CM | POA: Diagnosis not present

## 2018-11-12 DIAGNOSIS — D631 Anemia in chronic kidney disease: Secondary | ICD-10-CM | POA: Diagnosis not present

## 2018-11-12 DIAGNOSIS — D509 Iron deficiency anemia, unspecified: Secondary | ICD-10-CM | POA: Diagnosis not present

## 2018-11-14 DIAGNOSIS — N2581 Secondary hyperparathyroidism of renal origin: Secondary | ICD-10-CM | POA: Diagnosis not present

## 2018-11-14 DIAGNOSIS — N186 End stage renal disease: Secondary | ICD-10-CM | POA: Diagnosis not present

## 2018-11-14 DIAGNOSIS — D509 Iron deficiency anemia, unspecified: Secondary | ICD-10-CM | POA: Diagnosis not present

## 2018-11-14 DIAGNOSIS — D631 Anemia in chronic kidney disease: Secondary | ICD-10-CM | POA: Diagnosis not present

## 2018-11-17 DIAGNOSIS — N2581 Secondary hyperparathyroidism of renal origin: Secondary | ICD-10-CM | POA: Diagnosis not present

## 2018-11-17 DIAGNOSIS — D509 Iron deficiency anemia, unspecified: Secondary | ICD-10-CM | POA: Diagnosis not present

## 2018-11-17 DIAGNOSIS — D631 Anemia in chronic kidney disease: Secondary | ICD-10-CM | POA: Diagnosis not present

## 2018-11-17 DIAGNOSIS — N186 End stage renal disease: Secondary | ICD-10-CM | POA: Diagnosis not present

## 2018-11-19 DIAGNOSIS — N186 End stage renal disease: Secondary | ICD-10-CM | POA: Diagnosis not present

## 2018-11-19 DIAGNOSIS — D509 Iron deficiency anemia, unspecified: Secondary | ICD-10-CM | POA: Diagnosis not present

## 2018-11-19 DIAGNOSIS — D631 Anemia in chronic kidney disease: Secondary | ICD-10-CM | POA: Diagnosis not present

## 2018-11-19 DIAGNOSIS — N2581 Secondary hyperparathyroidism of renal origin: Secondary | ICD-10-CM | POA: Diagnosis not present

## 2018-11-21 DIAGNOSIS — N2581 Secondary hyperparathyroidism of renal origin: Secondary | ICD-10-CM | POA: Diagnosis not present

## 2018-11-21 DIAGNOSIS — N186 End stage renal disease: Secondary | ICD-10-CM | POA: Diagnosis not present

## 2018-11-21 DIAGNOSIS — D631 Anemia in chronic kidney disease: Secondary | ICD-10-CM | POA: Diagnosis not present

## 2018-11-21 DIAGNOSIS — D509 Iron deficiency anemia, unspecified: Secondary | ICD-10-CM | POA: Diagnosis not present

## 2018-11-23 DIAGNOSIS — D631 Anemia in chronic kidney disease: Secondary | ICD-10-CM | POA: Diagnosis not present

## 2018-11-23 DIAGNOSIS — N2581 Secondary hyperparathyroidism of renal origin: Secondary | ICD-10-CM | POA: Diagnosis not present

## 2018-11-23 DIAGNOSIS — D509 Iron deficiency anemia, unspecified: Secondary | ICD-10-CM | POA: Diagnosis not present

## 2018-11-23 DIAGNOSIS — N186 End stage renal disease: Secondary | ICD-10-CM | POA: Diagnosis not present

## 2018-11-25 DIAGNOSIS — N2581 Secondary hyperparathyroidism of renal origin: Secondary | ICD-10-CM | POA: Diagnosis not present

## 2018-11-25 DIAGNOSIS — D631 Anemia in chronic kidney disease: Secondary | ICD-10-CM | POA: Diagnosis not present

## 2018-11-25 DIAGNOSIS — N186 End stage renal disease: Secondary | ICD-10-CM | POA: Diagnosis not present

## 2018-11-25 DIAGNOSIS — D509 Iron deficiency anemia, unspecified: Secondary | ICD-10-CM | POA: Diagnosis not present

## 2018-11-28 DIAGNOSIS — N186 End stage renal disease: Secondary | ICD-10-CM | POA: Diagnosis not present

## 2018-11-28 DIAGNOSIS — D509 Iron deficiency anemia, unspecified: Secondary | ICD-10-CM | POA: Diagnosis not present

## 2018-11-28 DIAGNOSIS — N2581 Secondary hyperparathyroidism of renal origin: Secondary | ICD-10-CM | POA: Diagnosis not present

## 2018-11-28 DIAGNOSIS — D631 Anemia in chronic kidney disease: Secondary | ICD-10-CM | POA: Diagnosis not present

## 2018-11-30 DIAGNOSIS — D509 Iron deficiency anemia, unspecified: Secondary | ICD-10-CM | POA: Diagnosis not present

## 2018-11-30 DIAGNOSIS — N2581 Secondary hyperparathyroidism of renal origin: Secondary | ICD-10-CM | POA: Diagnosis not present

## 2018-11-30 DIAGNOSIS — N186 End stage renal disease: Secondary | ICD-10-CM | POA: Diagnosis not present

## 2018-11-30 DIAGNOSIS — D631 Anemia in chronic kidney disease: Secondary | ICD-10-CM | POA: Diagnosis not present

## 2018-12-02 DIAGNOSIS — D631 Anemia in chronic kidney disease: Secondary | ICD-10-CM | POA: Diagnosis not present

## 2018-12-02 DIAGNOSIS — N186 End stage renal disease: Secondary | ICD-10-CM | POA: Diagnosis not present

## 2018-12-02 DIAGNOSIS — D509 Iron deficiency anemia, unspecified: Secondary | ICD-10-CM | POA: Diagnosis not present

## 2018-12-02 DIAGNOSIS — N2581 Secondary hyperparathyroidism of renal origin: Secondary | ICD-10-CM | POA: Diagnosis not present

## 2018-12-02 IMAGING — CT CT BIOPSY
1 of 2 series · 9 of 16 positions shown, 12 images · non-contrast
Comparison: none

INDICATION: Monoclonal gammopathy, concern for myeloma

[Series 2: i-spiral 5.0 b40f · axial · 0.81mm/px · z∈[-202,-97]mm · 9 of 38 slices shown, 12 images]
[im 4/38  soft-tissue]
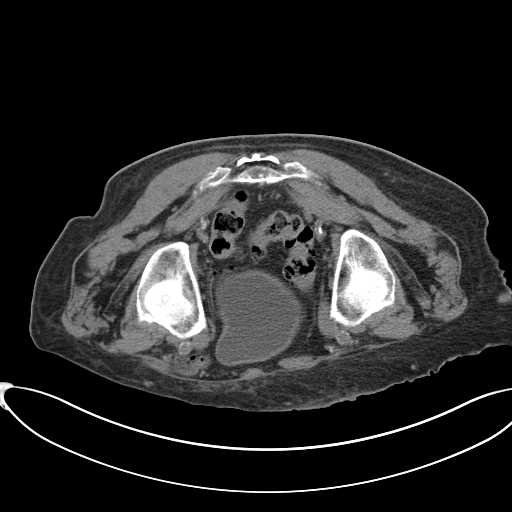
[im 4/38  bone]
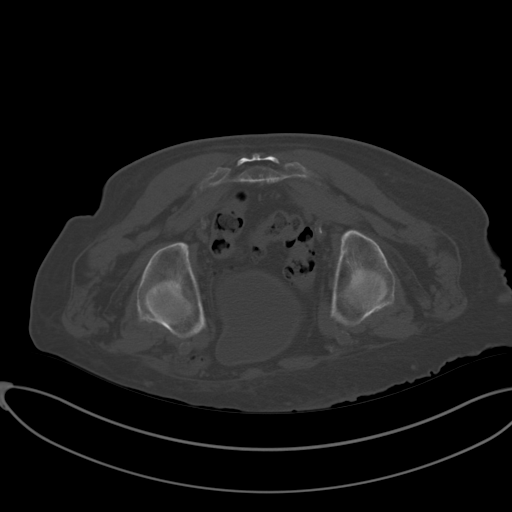
[im 8/38  soft-tissue]
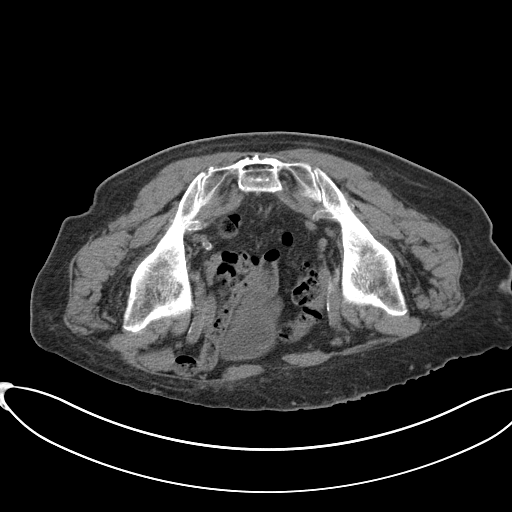
[im 12/38  soft-tissue]
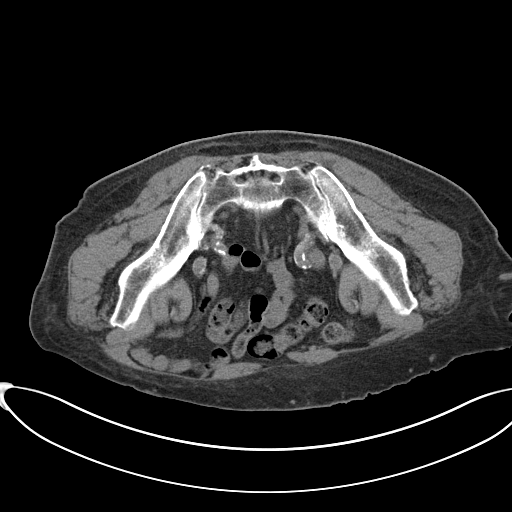
[im 15/38  soft-tissue]
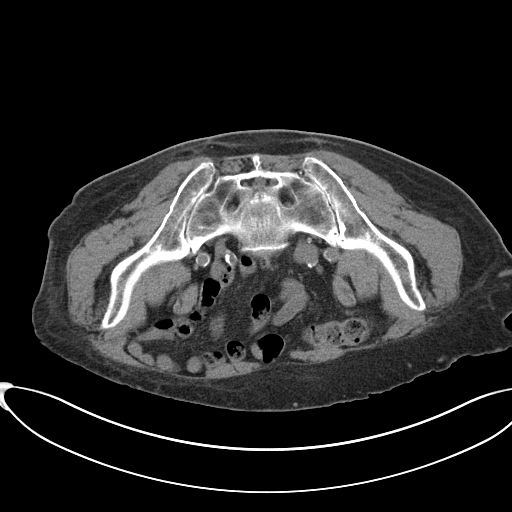
[im 19/38  soft-tissue]
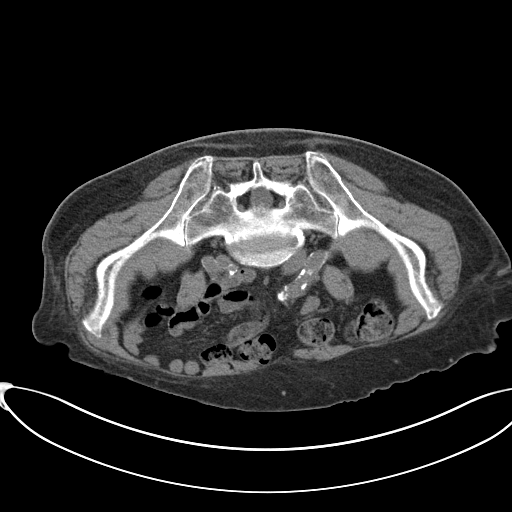
[im 19/38  bone]
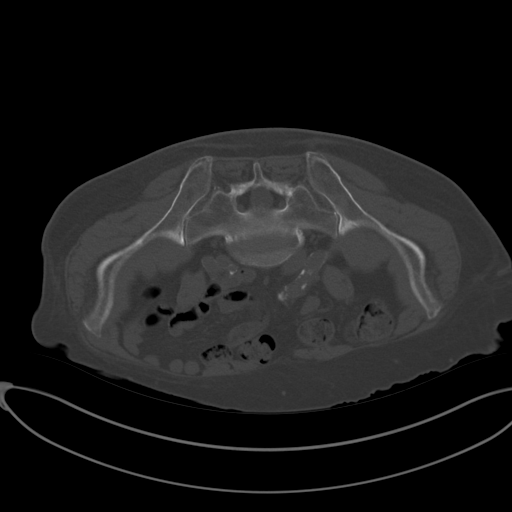
[im 23/38  soft-tissue]
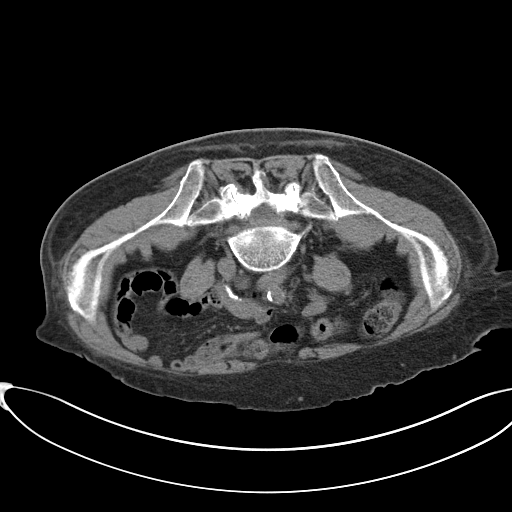
[im 26/38  soft-tissue]
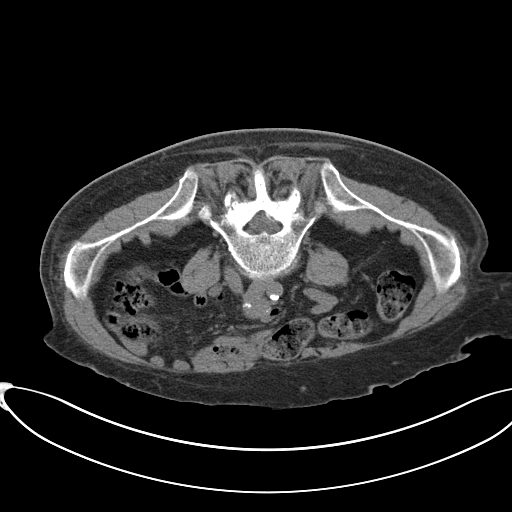
[im 30/38  soft-tissue]
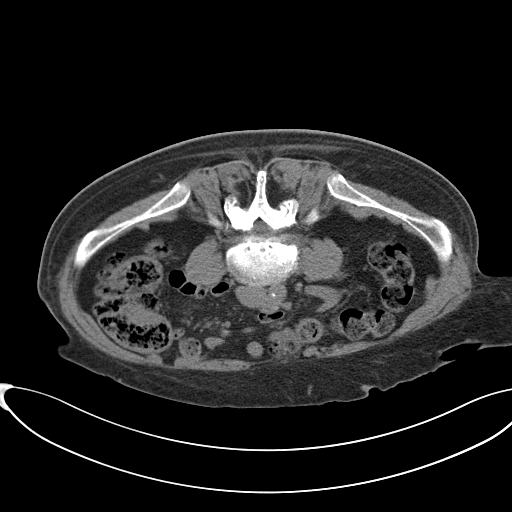
[im 34/38  soft-tissue]
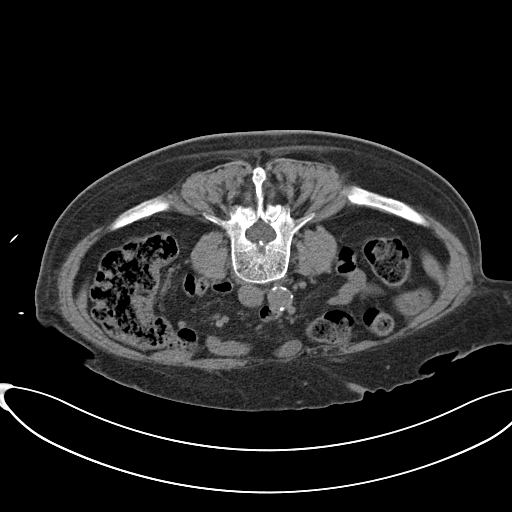
[im 34/38  bone]
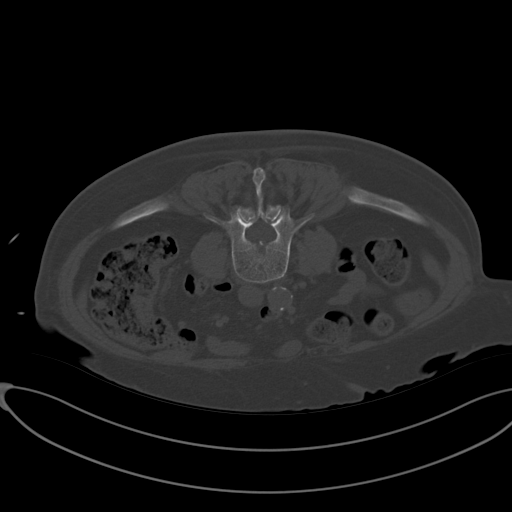

[9 of 16 positions shown; findings below may reference images not displayed]

EXAM:
CT GUIDED RIGHT ILIAC BONE MARROW ASPIRATION AND CORE BIOPSY

Radiologist:  Orenstein, Evie

Guidance:  CT

FLUOROSCOPY TIME:  Fluoroscopy Time: NONE.

MEDICATIONS:
None.

ANESTHESIA/SEDATION:
2.0 mg IV Versed; 100 mcg IV Fentanyl

Moderate Sedation Time:  10 minutes

The patient was continuously monitored during the procedure by the
interventional radiology nurse under my direct supervision.

CONTRAST:  None.

COMPLICATIONS:
None

PROCEDURE:
Informed consent was obtained from the patient following explanation
of the procedure, risks, benefits and alternatives. The patient
understands, agrees and consents for the procedure. All questions
were addressed. A time out was performed.

The patient was positioned prone and non-contrast localization CT
was performed of the pelvis to demonstrate the iliac marrow spaces.

Maximal barrier sterile technique utilized including caps, mask,
sterile gowns, sterile gloves, large sterile drape, hand hygiene,
and Betadine prep.

Under sterile conditions and local anesthesia, an 11 gauge coaxial
bone biopsy needle was advanced into the right iliac marrow space.
Needle position was confirmed with CT imaging. Initially, bone
marrow aspiration was performed. Next, the 11 gauge outer cannula
was utilized to obtain a right iliac bone marrow core biopsy. Needle
was removed. Hemostasis was obtained with compression. The patient
tolerated the procedure well. Samples were prepared with the
cytotechnologist. No immediate complications.
IMPRESSION: CT guided right iliac bone marrow aspiration and core biopsy.

## 2018-12-03 DIAGNOSIS — I129 Hypertensive chronic kidney disease with stage 1 through stage 4 chronic kidney disease, or unspecified chronic kidney disease: Secondary | ICD-10-CM | POA: Diagnosis not present

## 2018-12-03 DIAGNOSIS — N186 End stage renal disease: Secondary | ICD-10-CM | POA: Diagnosis not present

## 2018-12-03 DIAGNOSIS — Z992 Dependence on renal dialysis: Secondary | ICD-10-CM | POA: Diagnosis not present

## 2018-12-05 DIAGNOSIS — N2581 Secondary hyperparathyroidism of renal origin: Secondary | ICD-10-CM | POA: Diagnosis not present

## 2018-12-05 DIAGNOSIS — N186 End stage renal disease: Secondary | ICD-10-CM | POA: Diagnosis not present

## 2018-12-05 DIAGNOSIS — D631 Anemia in chronic kidney disease: Secondary | ICD-10-CM | POA: Diagnosis not present

## 2018-12-05 DIAGNOSIS — D509 Iron deficiency anemia, unspecified: Secondary | ICD-10-CM | POA: Diagnosis not present

## 2018-12-08 DIAGNOSIS — D631 Anemia in chronic kidney disease: Secondary | ICD-10-CM | POA: Diagnosis not present

## 2018-12-08 DIAGNOSIS — N2581 Secondary hyperparathyroidism of renal origin: Secondary | ICD-10-CM | POA: Diagnosis not present

## 2018-12-08 DIAGNOSIS — N186 End stage renal disease: Secondary | ICD-10-CM | POA: Diagnosis not present

## 2018-12-08 DIAGNOSIS — D509 Iron deficiency anemia, unspecified: Secondary | ICD-10-CM | POA: Diagnosis not present

## 2018-12-10 DIAGNOSIS — D509 Iron deficiency anemia, unspecified: Secondary | ICD-10-CM | POA: Diagnosis not present

## 2018-12-10 DIAGNOSIS — N2581 Secondary hyperparathyroidism of renal origin: Secondary | ICD-10-CM | POA: Diagnosis not present

## 2018-12-10 DIAGNOSIS — D631 Anemia in chronic kidney disease: Secondary | ICD-10-CM | POA: Diagnosis not present

## 2018-12-10 DIAGNOSIS — N186 End stage renal disease: Secondary | ICD-10-CM | POA: Diagnosis not present

## 2018-12-12 DIAGNOSIS — N2581 Secondary hyperparathyroidism of renal origin: Secondary | ICD-10-CM | POA: Diagnosis not present

## 2018-12-12 DIAGNOSIS — D509 Iron deficiency anemia, unspecified: Secondary | ICD-10-CM | POA: Diagnosis not present

## 2018-12-12 DIAGNOSIS — N186 End stage renal disease: Secondary | ICD-10-CM | POA: Diagnosis not present

## 2018-12-12 DIAGNOSIS — D631 Anemia in chronic kidney disease: Secondary | ICD-10-CM | POA: Diagnosis not present

## 2018-12-15 DIAGNOSIS — N2581 Secondary hyperparathyroidism of renal origin: Secondary | ICD-10-CM | POA: Diagnosis not present

## 2018-12-15 DIAGNOSIS — D631 Anemia in chronic kidney disease: Secondary | ICD-10-CM | POA: Diagnosis not present

## 2018-12-15 DIAGNOSIS — D509 Iron deficiency anemia, unspecified: Secondary | ICD-10-CM | POA: Diagnosis not present

## 2018-12-15 DIAGNOSIS — N186 End stage renal disease: Secondary | ICD-10-CM | POA: Diagnosis not present

## 2018-12-17 DIAGNOSIS — N186 End stage renal disease: Secondary | ICD-10-CM | POA: Diagnosis not present

## 2018-12-17 DIAGNOSIS — N2581 Secondary hyperparathyroidism of renal origin: Secondary | ICD-10-CM | POA: Diagnosis not present

## 2018-12-17 DIAGNOSIS — D509 Iron deficiency anemia, unspecified: Secondary | ICD-10-CM | POA: Diagnosis not present

## 2018-12-17 DIAGNOSIS — D631 Anemia in chronic kidney disease: Secondary | ICD-10-CM | POA: Diagnosis not present

## 2018-12-19 DIAGNOSIS — D509 Iron deficiency anemia, unspecified: Secondary | ICD-10-CM | POA: Diagnosis not present

## 2018-12-19 DIAGNOSIS — N2581 Secondary hyperparathyroidism of renal origin: Secondary | ICD-10-CM | POA: Diagnosis not present

## 2018-12-19 DIAGNOSIS — D631 Anemia in chronic kidney disease: Secondary | ICD-10-CM | POA: Diagnosis not present

## 2018-12-19 DIAGNOSIS — N186 End stage renal disease: Secondary | ICD-10-CM | POA: Diagnosis not present

## 2018-12-22 DIAGNOSIS — D631 Anemia in chronic kidney disease: Secondary | ICD-10-CM | POA: Diagnosis not present

## 2018-12-22 DIAGNOSIS — N2581 Secondary hyperparathyroidism of renal origin: Secondary | ICD-10-CM | POA: Diagnosis not present

## 2018-12-22 DIAGNOSIS — N186 End stage renal disease: Secondary | ICD-10-CM | POA: Diagnosis not present

## 2018-12-22 DIAGNOSIS — D509 Iron deficiency anemia, unspecified: Secondary | ICD-10-CM | POA: Diagnosis not present

## 2018-12-24 DIAGNOSIS — D509 Iron deficiency anemia, unspecified: Secondary | ICD-10-CM | POA: Diagnosis not present

## 2018-12-24 DIAGNOSIS — D631 Anemia in chronic kidney disease: Secondary | ICD-10-CM | POA: Diagnosis not present

## 2018-12-24 DIAGNOSIS — N186 End stage renal disease: Secondary | ICD-10-CM | POA: Diagnosis not present

## 2018-12-24 DIAGNOSIS — N2581 Secondary hyperparathyroidism of renal origin: Secondary | ICD-10-CM | POA: Diagnosis not present

## 2018-12-26 DIAGNOSIS — D509 Iron deficiency anemia, unspecified: Secondary | ICD-10-CM | POA: Diagnosis not present

## 2018-12-26 DIAGNOSIS — N186 End stage renal disease: Secondary | ICD-10-CM | POA: Diagnosis not present

## 2018-12-26 DIAGNOSIS — D631 Anemia in chronic kidney disease: Secondary | ICD-10-CM | POA: Diagnosis not present

## 2018-12-26 DIAGNOSIS — N2581 Secondary hyperparathyroidism of renal origin: Secondary | ICD-10-CM | POA: Diagnosis not present

## 2018-12-29 DIAGNOSIS — D509 Iron deficiency anemia, unspecified: Secondary | ICD-10-CM | POA: Diagnosis not present

## 2018-12-29 DIAGNOSIS — N186 End stage renal disease: Secondary | ICD-10-CM | POA: Diagnosis not present

## 2018-12-29 DIAGNOSIS — D631 Anemia in chronic kidney disease: Secondary | ICD-10-CM | POA: Diagnosis not present

## 2018-12-29 DIAGNOSIS — N2581 Secondary hyperparathyroidism of renal origin: Secondary | ICD-10-CM | POA: Diagnosis not present

## 2018-12-30 ENCOUNTER — Other Ambulatory Visit: Payer: Self-pay

## 2018-12-30 ENCOUNTER — Ambulatory Visit (HOSPITAL_COMMUNITY)
Admission: EM | Admit: 2018-12-30 | Discharge: 2018-12-30 | Disposition: A | Discharge status: Home / Self Care | Payer: Medicare Other | Attending: Family Medicine | Admitting: Family Medicine

## 2018-12-30 ENCOUNTER — Encounter (HOSPITAL_COMMUNITY): Payer: Self-pay | Admitting: Family Medicine

## 2018-12-30 DIAGNOSIS — R10824 Left lower quadrant rebound abdominal tenderness: Secondary | ICD-10-CM | POA: Diagnosis not present

## 2018-12-30 DIAGNOSIS — K5900 Constipation, unspecified: Secondary | ICD-10-CM | POA: Insufficient documentation

## 2018-12-30 MED ORDER — AMOXICILLIN-POT CLAVULANATE 875-125 MG PO TABS: 1.0000 | ORAL_TABLET | Freq: Two times a day (BID) | ORAL | 0 refills | Status: DC

## 2018-12-30 MED ORDER — POLYETHYLENE GLYCOL 3350 17 G PO PACK: 17.0000 g | PACK | Freq: Every day | ORAL | 0 refills | Status: AC

## 2018-12-30 NOTE — ED Notes (Signed)
Patient requesting to sit a minute

## 2018-12-30 NOTE — ED Triage Notes (Signed)
Last bm was 3 days ago.  Reports discomfort with sitting.  Patient has taken 2 pills to make bowels move-took these last night.    Patient did not call pcp

## 2018-12-30 NOTE — ED Provider Notes (Signed)
[facility]    CSN: [id number] Arrival date & time: 12/30/18  0756     History   Chief Complaint Chief Complaint  Patient presents with  . Constipation    HPI Lonnie [name] is a 85 y.o. male.   This is an initial visit for this 85 year old man who complains of constipation.  He did not call his primary care doctor.  Patient last bowel movement was 3 days ago.     Past Medical History:  Diagnosis Date  . Adenomatous polyps   . Bursitis    left hip  . Chronic kidney disease    Stage 4  . Frequent urination at night   . GERD (gastroesophageal reflux disease)   . Hypertension   . Hypothyroidism   . Pneumonia   . Polyclonal gammopathy determined by serum protein electrophoresis 10/04/2015    Patient Active Problem List   Diagnosis Date Noted  . ESRD on dialysis ([place]) 08/24/2016  . Monoclonal gammopathy 10/06/2015  . Gammopathy, monoclonal   . Polyclonal gammopathy determined by serum protein electrophoresis 10/04/2015  . Depression 03/29/2015  . Intertrigo 05/30/2014  . Proteinuria 05/05/2014    Past Surgical History:  Procedure Laterality Date  . AORTIC ARCH ANGIOGRAPHY N/A 08/01/2017   Procedure: Aortic Arch Angiography;  Surgeon: Conrad Gonzales, MD;  Location: [facility];  Service: Cardiovascular;  Laterality: N/A;  . AV FISTULA PLACEMENT Left 07/10/2016   Procedure: LEFT BRACHIOCEPHALIC ARTERIOVENOUS (AV) FISTULA CREATION;  Surgeon: Conrad Buckhannon, MD;  Location: [facility];  Service: Vascular;  Laterality: Left;  . COLONOSCOPY    . RENAL BIOPSY Left 10/06/2015  . REVISON OF ARTERIOVENOUS FISTULA Left 19/62/2297   Procedure: PLICATION OF LEFT BRACHIOCEPHALIC ARTERIOVENOUS FISTULA;  Surgeon: Conrad Earlville, MD;  Location: [facility];  Service: Vascular;  Laterality: Left;  . SKIN SURGERY     back  . UPPER EXTREMITY ANGIOGRAPHY Left 08/01/2017   Procedure: Upper Extremity Angiography;  Surgeon: Conrad Caledonia, MD;  Location: [facility];  Service:  Cardiovascular;  Laterality: Left;       Home Medications    Prior to Admission medications   Medication Sig Start Date End Date Taking? Authorizing Provider  amLODipine (NORVASC) 5 MG tablet Take 5 mg by mouth at bedtime.  03/23/14   [provider]  amoxicillin-clavulanate (AUGMENTIN) 875-125 MG tablet Take 1 tablet by mouth every 12 (twelve) hours. 12/30/18   Robyn Haber, MD  calcium acetate (PHOSLO) 667 MG capsule Take 1 capsule by mouth 3 (three) times daily with meals.  04/29/17   [provider]  diphenhydramine-acetaminophen (TYLENOL PM) 25-500 MG TABS tablet Take 1 tablet by mouth at bedtime as needed (sleep).    [provider]  escitalopram (LEXAPRO) 10 MG tablet Take 10 mg by mouth at bedtime.  06/14/16   [provider]  levothyroxine (SYNTHROID, LEVOTHROID) 88 MCG tablet Take 88 mcg by mouth at bedtime.    [provider]  lidocaine-prilocaine (EMLA) cream Apply 1 application topically daily as needed (port access).  02/06/17   [provider]  mirabegron ER (MYRBETRIQ) 50 MG TB24 tablet Take 50 mg by mouth at bedtime.     [provider]  multivitamin (RENA-VIT) TABS tablet Take 1 tablet by mouth daily.    [provider]  polyethylene glycol (MIRALAX) packet Take 17 g by mouth daily. 12/30/18   Robyn Haber, MD  polyvinyl alcohol (LIQUIFILM TEARS) 1.4 % ophthalmic solution Place 1-2 drops  into both eyes as needed for dry eyes.    [provider]  ranitidine (ZANTAC) 150 MG capsule Take 150 mg by mouth 2 (two) times daily.    [provider]    Family History Family History  Problem Relation Age of Onset  . Diabetes Father   . Diabetes Brother   . Heart disease Brother   . Colon cancer Neg Hx   . Rectal cancer Neg Hx     Social History Social History   Tobacco Use  . Smoking status: Former Smoker    Last attempt to quit: 01/25/1980    Years since quitting: 38.9  .  Smokeless tobacco: Never Used  Substance Use Topics  . Alcohol use: No    Comment: former alcoholic 32 years ago was last drink  . Drug use: No     Allergies   Patient has no known allergies.   Review of Systems Review of Systems   Physical Exam Triage Vital Signs ED Triage Vitals [12/30/18 0822]  Enc Vitals Group     BP      Pulse      Resp      Temp      Temp src      SpO2      Weight      Height      Head Circumference      Peak Flow      Pain Score 8     Pain Loc      Pain Edu?      Excl. in [place]?    No data found.  Updated Vital Signs BP (!) 145/72 (BP Location: Right Arm)   Pulse 79   Temp (!) 97.5 F (36.4 C) (Oral)   Resp 18   SpO2 99%    Physical Exam Vitals signs and nursing note reviewed.  Constitutional:      General: He is not in acute distress.    Appearance: Normal appearance. He is not ill-appearing.  HENT:     Mouth/Throat:     Mouth: Mucous membranes are moist.  Eyes:     Conjunctiva/sclera: Conjunctivae normal.  Cardiovascular:     Rate and Rhythm: Normal rate.  Pulmonary:     Effort: Pulmonary effort is normal.  Abdominal:     General: Bowel sounds are normal.     Tenderness: There is abdominal tenderness.     Comments: Tender left lower quadrant  Genitourinary:    Rectum: Normal.  Musculoskeletal: Normal range of motion.  Skin:    General: Skin is warm.  Neurological:     General: No focal deficit present.     Mental Status: He is alert.  Psychiatric:        Mood and Affect: Mood normal.      UC Treatments / Results  Labs (all labs ordered are listed, but only abnormal results are displayed) Labs Reviewed - No data to display  EKG None  Radiology No results found.  Procedures Procedures (including critical care time)  Medications Ordered in UC Medications - No data to display  Initial Impression / Assessment and Plan / UC Course  I have reviewed the triage vital signs and the nursing notes.  Pertinent  labs & imaging results that were available during my care of the patient were reviewed by me and considered in my medical decision making (see chart for details).      Final Clinical Impressions(s) / UC Diagnoses   Final diagnoses:  Constipation,  unspecified constipation type  Left lower quadrant abdominal tenderness with rebound tenderness     Discharge Instructions     Please schedule an appointment with your primary care doctor to discuss amlodipine and how it may be contributing to your constipation  I want you to start an antibiotic because of the pain in the left side of your abdomen when I palpate.  Pick up a fleets enema today and use it.  I want you to take the MiraLAX powder and mix it in water, then take it every 2 hours until you have a bowel movement and then stop.    ED Prescriptions    Medication Sig Dispense Auth. Provider   amoxicillin-clavulanate (AUGMENTIN) 875-125 MG tablet Take 1 tablet by mouth every 12 (twelve) hours. 14 tablet Robyn Haber, MD   polyethylene glycol [facility]) packet Take 17 g by mouth daily. 24 each Robyn Haber, MD     Controlled Substance Prescriptions [place] Controlled Substance Registry consulted? No   Robyn Haber, MD 12/30/18 [phone]

## 2018-12-30 NOTE — Discharge Instructions (Addendum)
Please schedule an appointment with your primary care doctor to discuss amlodipine and how it may be contributing to your constipation  I want you to start an antibiotic because of the pain in the left side of your abdomen when I palpate.  Pick up a fleets enema today and use it.  I want you to take the MiraLAX powder and mix it in water, then take it every 2 hours until you have a bowel movement and then stop.

## 2018-12-31 DIAGNOSIS — D509 Iron deficiency anemia, unspecified: Secondary | ICD-10-CM | POA: Diagnosis not present

## 2018-12-31 DIAGNOSIS — N186 End stage renal disease: Secondary | ICD-10-CM | POA: Diagnosis not present

## 2018-12-31 DIAGNOSIS — N2581 Secondary hyperparathyroidism of renal origin: Secondary | ICD-10-CM | POA: Diagnosis not present

## 2018-12-31 DIAGNOSIS — D631 Anemia in chronic kidney disease: Secondary | ICD-10-CM | POA: Diagnosis not present

## 2019-01-02 DIAGNOSIS — D631 Anemia in chronic kidney disease: Secondary | ICD-10-CM | POA: Diagnosis not present

## 2019-01-02 DIAGNOSIS — D509 Iron deficiency anemia, unspecified: Secondary | ICD-10-CM | POA: Diagnosis not present

## 2019-01-02 DIAGNOSIS — N186 End stage renal disease: Secondary | ICD-10-CM | POA: Diagnosis not present

## 2019-01-02 DIAGNOSIS — N2581 Secondary hyperparathyroidism of renal origin: Secondary | ICD-10-CM | POA: Diagnosis not present

## 2019-01-03 DIAGNOSIS — I129 Hypertensive chronic kidney disease with stage 1 through stage 4 chronic kidney disease, or unspecified chronic kidney disease: Secondary | ICD-10-CM | POA: Diagnosis not present

## 2019-01-03 DIAGNOSIS — Z992 Dependence on renal dialysis: Secondary | ICD-10-CM | POA: Diagnosis not present

## 2019-01-03 DIAGNOSIS — N186 End stage renal disease: Secondary | ICD-10-CM | POA: Diagnosis not present

## 2019-01-05 DIAGNOSIS — N2581 Secondary hyperparathyroidism of renal origin: Secondary | ICD-10-CM | POA: Diagnosis not present

## 2019-01-05 DIAGNOSIS — D509 Iron deficiency anemia, unspecified: Secondary | ICD-10-CM | POA: Diagnosis not present

## 2019-01-05 DIAGNOSIS — D631 Anemia in chronic kidney disease: Secondary | ICD-10-CM | POA: Diagnosis not present

## 2019-01-05 DIAGNOSIS — N186 End stage renal disease: Secondary | ICD-10-CM | POA: Diagnosis not present

## 2019-01-07 DIAGNOSIS — N186 End stage renal disease: Secondary | ICD-10-CM | POA: Diagnosis not present

## 2019-01-07 DIAGNOSIS — D509 Iron deficiency anemia, unspecified: Secondary | ICD-10-CM | POA: Diagnosis not present

## 2019-01-07 DIAGNOSIS — N2581 Secondary hyperparathyroidism of renal origin: Secondary | ICD-10-CM | POA: Diagnosis not present

## 2019-01-07 DIAGNOSIS — D631 Anemia in chronic kidney disease: Secondary | ICD-10-CM | POA: Diagnosis not present

## 2019-01-09 DIAGNOSIS — N2581 Secondary hyperparathyroidism of renal origin: Secondary | ICD-10-CM | POA: Diagnosis not present

## 2019-01-09 DIAGNOSIS — D631 Anemia in chronic kidney disease: Secondary | ICD-10-CM | POA: Diagnosis not present

## 2019-01-09 DIAGNOSIS — N186 End stage renal disease: Secondary | ICD-10-CM | POA: Diagnosis not present

## 2019-01-09 DIAGNOSIS — D509 Iron deficiency anemia, unspecified: Secondary | ICD-10-CM | POA: Diagnosis not present

## 2019-01-12 DIAGNOSIS — D509 Iron deficiency anemia, unspecified: Secondary | ICD-10-CM | POA: Diagnosis not present

## 2019-01-12 DIAGNOSIS — N2581 Secondary hyperparathyroidism of renal origin: Secondary | ICD-10-CM | POA: Diagnosis not present

## 2019-01-12 DIAGNOSIS — D631 Anemia in chronic kidney disease: Secondary | ICD-10-CM | POA: Diagnosis not present

## 2019-01-12 DIAGNOSIS — N186 End stage renal disease: Secondary | ICD-10-CM | POA: Diagnosis not present

## 2019-01-14 DIAGNOSIS — N2581 Secondary hyperparathyroidism of renal origin: Secondary | ICD-10-CM | POA: Diagnosis not present

## 2019-01-14 DIAGNOSIS — N186 End stage renal disease: Secondary | ICD-10-CM | POA: Diagnosis not present

## 2019-01-14 DIAGNOSIS — D631 Anemia in chronic kidney disease: Secondary | ICD-10-CM | POA: Diagnosis not present

## 2019-01-14 DIAGNOSIS — D509 Iron deficiency anemia, unspecified: Secondary | ICD-10-CM | POA: Diagnosis not present

## 2019-01-16 DIAGNOSIS — N186 End stage renal disease: Secondary | ICD-10-CM | POA: Diagnosis not present

## 2019-01-16 DIAGNOSIS — N2581 Secondary hyperparathyroidism of renal origin: Secondary | ICD-10-CM | POA: Diagnosis not present

## 2019-01-16 DIAGNOSIS — D631 Anemia in chronic kidney disease: Secondary | ICD-10-CM | POA: Diagnosis not present

## 2019-01-16 DIAGNOSIS — D509 Iron deficiency anemia, unspecified: Secondary | ICD-10-CM | POA: Diagnosis not present

## 2019-01-19 DIAGNOSIS — D631 Anemia in chronic kidney disease: Secondary | ICD-10-CM | POA: Diagnosis not present

## 2019-01-19 DIAGNOSIS — D509 Iron deficiency anemia, unspecified: Secondary | ICD-10-CM | POA: Diagnosis not present

## 2019-01-19 DIAGNOSIS — N186 End stage renal disease: Secondary | ICD-10-CM | POA: Diagnosis not present

## 2019-01-19 DIAGNOSIS — N2581 Secondary hyperparathyroidism of renal origin: Secondary | ICD-10-CM | POA: Diagnosis not present

## 2019-01-21 DIAGNOSIS — N2581 Secondary hyperparathyroidism of renal origin: Secondary | ICD-10-CM | POA: Diagnosis not present

## 2019-01-21 DIAGNOSIS — D631 Anemia in chronic kidney disease: Secondary | ICD-10-CM | POA: Diagnosis not present

## 2019-01-21 DIAGNOSIS — D509 Iron deficiency anemia, unspecified: Secondary | ICD-10-CM | POA: Diagnosis not present

## 2019-01-21 DIAGNOSIS — N186 End stage renal disease: Secondary | ICD-10-CM | POA: Diagnosis not present

## 2019-01-23 DIAGNOSIS — N186 End stage renal disease: Secondary | ICD-10-CM | POA: Diagnosis not present

## 2019-01-23 DIAGNOSIS — D509 Iron deficiency anemia, unspecified: Secondary | ICD-10-CM | POA: Diagnosis not present

## 2019-01-23 DIAGNOSIS — D631 Anemia in chronic kidney disease: Secondary | ICD-10-CM | POA: Diagnosis not present

## 2019-01-23 DIAGNOSIS — N2581 Secondary hyperparathyroidism of renal origin: Secondary | ICD-10-CM | POA: Diagnosis not present

## 2019-01-26 DIAGNOSIS — N2581 Secondary hyperparathyroidism of renal origin: Secondary | ICD-10-CM | POA: Diagnosis not present

## 2019-01-26 DIAGNOSIS — D631 Anemia in chronic kidney disease: Secondary | ICD-10-CM | POA: Diagnosis not present

## 2019-01-26 DIAGNOSIS — N186 End stage renal disease: Secondary | ICD-10-CM | POA: Diagnosis not present

## 2019-01-26 DIAGNOSIS — D509 Iron deficiency anemia, unspecified: Secondary | ICD-10-CM | POA: Diagnosis not present

## 2019-01-28 DIAGNOSIS — N186 End stage renal disease: Secondary | ICD-10-CM | POA: Diagnosis not present

## 2019-01-28 DIAGNOSIS — D509 Iron deficiency anemia, unspecified: Secondary | ICD-10-CM | POA: Diagnosis not present

## 2019-01-28 DIAGNOSIS — N2581 Secondary hyperparathyroidism of renal origin: Secondary | ICD-10-CM | POA: Diagnosis not present

## 2019-01-28 DIAGNOSIS — D631 Anemia in chronic kidney disease: Secondary | ICD-10-CM | POA: Diagnosis not present

## 2019-01-30 DIAGNOSIS — D509 Iron deficiency anemia, unspecified: Secondary | ICD-10-CM | POA: Diagnosis not present

## 2019-01-30 DIAGNOSIS — D631 Anemia in chronic kidney disease: Secondary | ICD-10-CM | POA: Diagnosis not present

## 2019-01-30 DIAGNOSIS — N186 End stage renal disease: Secondary | ICD-10-CM | POA: Diagnosis not present

## 2019-01-30 DIAGNOSIS — N2581 Secondary hyperparathyroidism of renal origin: Secondary | ICD-10-CM | POA: Diagnosis not present

## 2019-02-01 DIAGNOSIS — I129 Hypertensive chronic kidney disease with stage 1 through stage 4 chronic kidney disease, or unspecified chronic kidney disease: Secondary | ICD-10-CM | POA: Diagnosis not present

## 2019-02-01 DIAGNOSIS — Z992 Dependence on renal dialysis: Secondary | ICD-10-CM | POA: Diagnosis not present

## 2019-02-01 DIAGNOSIS — N186 End stage renal disease: Secondary | ICD-10-CM | POA: Diagnosis not present

## 2019-02-02 DIAGNOSIS — D509 Iron deficiency anemia, unspecified: Secondary | ICD-10-CM | POA: Diagnosis not present

## 2019-02-02 DIAGNOSIS — D631 Anemia in chronic kidney disease: Secondary | ICD-10-CM | POA: Diagnosis not present

## 2019-02-02 DIAGNOSIS — N2581 Secondary hyperparathyroidism of renal origin: Secondary | ICD-10-CM | POA: Diagnosis not present

## 2019-02-02 DIAGNOSIS — N186 End stage renal disease: Secondary | ICD-10-CM | POA: Diagnosis not present

## 2019-02-04 DIAGNOSIS — N2581 Secondary hyperparathyroidism of renal origin: Secondary | ICD-10-CM | POA: Diagnosis not present

## 2019-02-04 DIAGNOSIS — N186 End stage renal disease: Secondary | ICD-10-CM | POA: Diagnosis not present

## 2019-02-04 DIAGNOSIS — D631 Anemia in chronic kidney disease: Secondary | ICD-10-CM | POA: Diagnosis not present

## 2019-02-04 DIAGNOSIS — D509 Iron deficiency anemia, unspecified: Secondary | ICD-10-CM | POA: Diagnosis not present

## 2019-02-06 DIAGNOSIS — N186 End stage renal disease: Secondary | ICD-10-CM | POA: Diagnosis not present

## 2019-02-06 DIAGNOSIS — D509 Iron deficiency anemia, unspecified: Secondary | ICD-10-CM | POA: Diagnosis not present

## 2019-02-06 DIAGNOSIS — N2581 Secondary hyperparathyroidism of renal origin: Secondary | ICD-10-CM | POA: Diagnosis not present

## 2019-02-06 DIAGNOSIS — D631 Anemia in chronic kidney disease: Secondary | ICD-10-CM | POA: Diagnosis not present

## 2019-02-09 DIAGNOSIS — N186 End stage renal disease: Secondary | ICD-10-CM | POA: Diagnosis not present

## 2019-02-09 DIAGNOSIS — D509 Iron deficiency anemia, unspecified: Secondary | ICD-10-CM | POA: Diagnosis not present

## 2019-02-09 DIAGNOSIS — D631 Anemia in chronic kidney disease: Secondary | ICD-10-CM | POA: Diagnosis not present

## 2019-02-09 DIAGNOSIS — N2581 Secondary hyperparathyroidism of renal origin: Secondary | ICD-10-CM | POA: Diagnosis not present

## 2019-02-11 DIAGNOSIS — N2581 Secondary hyperparathyroidism of renal origin: Secondary | ICD-10-CM | POA: Diagnosis not present

## 2019-02-11 DIAGNOSIS — D631 Anemia in chronic kidney disease: Secondary | ICD-10-CM | POA: Diagnosis not present

## 2019-02-11 DIAGNOSIS — N186 End stage renal disease: Secondary | ICD-10-CM | POA: Diagnosis not present

## 2019-02-11 DIAGNOSIS — D509 Iron deficiency anemia, unspecified: Secondary | ICD-10-CM | POA: Diagnosis not present

## 2019-02-13 DIAGNOSIS — N186 End stage renal disease: Secondary | ICD-10-CM | POA: Diagnosis not present

## 2019-02-13 DIAGNOSIS — D509 Iron deficiency anemia, unspecified: Secondary | ICD-10-CM | POA: Diagnosis not present

## 2019-02-13 DIAGNOSIS — N2581 Secondary hyperparathyroidism of renal origin: Secondary | ICD-10-CM | POA: Diagnosis not present

## 2019-02-13 DIAGNOSIS — D631 Anemia in chronic kidney disease: Secondary | ICD-10-CM | POA: Diagnosis not present

## 2019-02-16 DIAGNOSIS — N186 End stage renal disease: Secondary | ICD-10-CM | POA: Diagnosis not present

## 2019-02-16 DIAGNOSIS — D509 Iron deficiency anemia, unspecified: Secondary | ICD-10-CM | POA: Diagnosis not present

## 2019-02-16 DIAGNOSIS — N2581 Secondary hyperparathyroidism of renal origin: Secondary | ICD-10-CM | POA: Diagnosis not present

## 2019-02-16 DIAGNOSIS — D631 Anemia in chronic kidney disease: Secondary | ICD-10-CM | POA: Diagnosis not present

## 2019-02-18 DIAGNOSIS — D509 Iron deficiency anemia, unspecified: Secondary | ICD-10-CM | POA: Diagnosis not present

## 2019-02-18 DIAGNOSIS — D631 Anemia in chronic kidney disease: Secondary | ICD-10-CM | POA: Diagnosis not present

## 2019-02-18 DIAGNOSIS — N2581 Secondary hyperparathyroidism of renal origin: Secondary | ICD-10-CM | POA: Diagnosis not present

## 2019-02-18 DIAGNOSIS — N186 End stage renal disease: Secondary | ICD-10-CM | POA: Diagnosis not present

## 2019-02-20 DIAGNOSIS — N186 End stage renal disease: Secondary | ICD-10-CM | POA: Diagnosis not present

## 2019-02-20 DIAGNOSIS — D509 Iron deficiency anemia, unspecified: Secondary | ICD-10-CM | POA: Diagnosis not present

## 2019-02-20 DIAGNOSIS — N2581 Secondary hyperparathyroidism of renal origin: Secondary | ICD-10-CM | POA: Diagnosis not present

## 2019-02-20 DIAGNOSIS — D631 Anemia in chronic kidney disease: Secondary | ICD-10-CM | POA: Diagnosis not present

## 2019-02-23 DIAGNOSIS — D631 Anemia in chronic kidney disease: Secondary | ICD-10-CM | POA: Diagnosis not present

## 2019-02-23 DIAGNOSIS — N2581 Secondary hyperparathyroidism of renal origin: Secondary | ICD-10-CM | POA: Diagnosis not present

## 2019-02-23 DIAGNOSIS — D509 Iron deficiency anemia, unspecified: Secondary | ICD-10-CM | POA: Diagnosis not present

## 2019-02-23 DIAGNOSIS — N186 End stage renal disease: Secondary | ICD-10-CM | POA: Diagnosis not present

## 2019-02-24 ENCOUNTER — Emergency Department (HOSPITAL_COMMUNITY)
Admission: EM | Admit: 2019-02-24 | Discharge: 2019-02-24 | Disposition: A | Discharge status: Home / Self Care | Payer: Medicare Other | Attending: Emergency Medicine | Admitting: Emergency Medicine

## 2019-02-24 ENCOUNTER — Encounter (HOSPITAL_COMMUNITY): Payer: Self-pay | Admitting: Emergency Medicine

## 2019-02-24 ENCOUNTER — Emergency Department (HOSPITAL_COMMUNITY): Payer: Medicare Other

## 2019-02-24 ENCOUNTER — Other Ambulatory Visit: Payer: Self-pay

## 2019-02-24 DIAGNOSIS — Z87891 Personal history of nicotine dependence: Secondary | ICD-10-CM | POA: Insufficient documentation

## 2019-02-24 DIAGNOSIS — R0789 Other chest pain: Secondary | ICD-10-CM

## 2019-02-24 DIAGNOSIS — E039 Hypothyroidism, unspecified: Secondary | ICD-10-CM | POA: Insufficient documentation

## 2019-02-24 DIAGNOSIS — N186 End stage renal disease: Secondary | ICD-10-CM | POA: Diagnosis not present

## 2019-02-24 DIAGNOSIS — R079 Chest pain, unspecified: Secondary | ICD-10-CM | POA: Insufficient documentation

## 2019-02-24 DIAGNOSIS — Z79899 Other long term (current) drug therapy: Secondary | ICD-10-CM | POA: Diagnosis not present

## 2019-02-24 DIAGNOSIS — N184 Chronic kidney disease, stage 4 (severe): Secondary | ICD-10-CM | POA: Diagnosis not present

## 2019-02-24 DIAGNOSIS — I129 Hypertensive chronic kidney disease with stage 1 through stage 4 chronic kidney disease, or unspecified chronic kidney disease: Secondary | ICD-10-CM | POA: Insufficient documentation

## 2019-02-24 DIAGNOSIS — R918 Other nonspecific abnormal finding of lung field: Secondary | ICD-10-CM | POA: Diagnosis not present

## 2019-02-24 LAB — BASIC METABOLIC PANEL
Anion gap: 18 — ABNORMAL HIGH (ref 5–15)
BUN: 29 mg/dL — ABNORMAL HIGH (ref 8–23)
CO2: 26 mmol/L (ref 22–32)
Calcium: 9.2 mg/dL (ref 8.9–10.3)
Chloride: 93 mmol/L — ABNORMAL LOW (ref 98–111)
Creatinine, Ser: 4.7 mg/dL — ABNORMAL HIGH (ref 0.61–1.24)
GFR calc Af Amer: 12 mL/min — ABNORMAL LOW (ref >60)
GFR calc non Af Amer: 10 mL/min — ABNORMAL LOW (ref >60)
Glucose, Bld: 149 mg/dL — ABNORMAL HIGH (ref 70–99)
Potassium: 3.7 mmol/L (ref 3.5–5.1)
Sodium: 137 mmol/L (ref 135–145)

## 2019-02-24 LAB — CBC
HCT: 34.5 % — ABNORMAL LOW (ref 39.0–52.0)
Hemoglobin: 10.9 g/dL — ABNORMAL LOW (ref 13.0–17.0)
MCH: 31.8 pg (ref 26.0–34.0)
MCHC: 31.6 g/dL (ref 30.0–36.0)
MCV: 100.6 fL — ABNORMAL HIGH (ref 80.0–100.0)
Platelets: 198 10*3/uL (ref 150–400)
RBC: 3.43 MIL/uL — ABNORMAL LOW (ref 4.22–5.81)
RDW: 14.2 % (ref 11.5–15.5)
WBC: 8.2 10*3/uL (ref 4.0–10.5)
nRBC: 0 % (ref 0.0–0.2)

## 2019-02-24 LAB — I-STAT TROPONIN, ED: Troponin i, poc: 0.01 ng/mL (ref 0.00–0.08)

## 2019-02-24 LAB — TROPONIN I: Troponin I: <0.03 ng/mL (ref <0.03)

## 2019-02-24 MED ORDER — LORAZEPAM 2 MG/ML IJ SOLN
0.5000 mg | Freq: Once | INTRAMUSCULAR | Status: AC
  Administered 2019-02-24: 0.5 mg via INTRAVENOUS
  Filled 2019-02-24: qty 1

## 2019-02-24 MED ORDER — MORPHINE SULFATE (PF) 4 MG/ML IV SOLN
4.0000 mg | Freq: Once | INTRAVENOUS | Status: AC
  Administered 2019-02-24: 4 mg via INTRAVENOUS
  Filled 2019-02-24: qty 1

## 2019-02-24 NOTE — ED Provider Notes (Signed)
[facility] DEPT Provider Note   CSN: [id number] Arrival date & time: 02/24/19  1723    History   Chief Complaint Chief Complaint  Patient presents with  . Chest Pain  . Headache  . Neck Pain    HPI Lonnie [name] is a 85 y.o. male.     HPI   85 year old male with pain in his upper chest, shoulders, neck and face.  Onset around 3 PM today while at rest.  Persistent since then.  Describes a constant pressure.  Denies any other associated symptoms.  Pressure feels increased when he takes a deep breath.  No coughing.  No fevers or chills.  No unusual leg pain or swelling.  Took 2 Tylenol prior to coming the emergency room but does not feel like it has helped at this point.  Denies any past history of similar symptoms.  He has end-stage renal disease and reports he had a full dialysis session yesterday.  Denies any past history of CAD.  Past Medical History:  Diagnosis Date  . Adenomatous polyps   . Bursitis    left hip  . Chronic kidney disease    Stage 4  . Frequent urination at night   . GERD (gastroesophageal reflux disease)   . Hypertension   . Hypothyroidism   . Pneumonia   . Polyclonal gammopathy determined by serum protein electrophoresis 10/04/2015    Patient Active Problem List   Diagnosis Date Noted  . ESRD on dialysis ([place]) 08/24/2016  . Monoclonal gammopathy 10/06/2015  . Gammopathy, monoclonal   . Polyclonal gammopathy determined by serum protein electrophoresis 10/04/2015  . Depression 03/29/2015  . Intertrigo 05/30/2014  . Proteinuria 05/05/2014    Past Surgical History:  Procedure Laterality Date  . AORTIC ARCH ANGIOGRAPHY N/A 08/01/2017   Procedure: Aortic Arch Angiography;  Surgeon: Conrad [place], MD;  Location: [facility];  Service: Cardiovascular;  Laterality: N/A;  . AV FISTULA PLACEMENT Left 07/10/2016   Procedure: LEFT BRACHIOCEPHALIC ARTERIOVENOUS (AV) FISTULA CREATION;  Surgeon: Conrad Avinger, MD;   Location: [facility];  Service: Vascular;  Laterality: Left;  . COLONOSCOPY    . RENAL BIOPSY Left 10/06/2015  . REVISON OF ARTERIOVENOUS FISTULA Left 59/56/3875   Procedure: PLICATION OF LEFT BRACHIOCEPHALIC ARTERIOVENOUS FISTULA;  Surgeon: Conrad Ketchum, MD;  Location: [facility];  Service: Vascular;  Laterality: Left;  . SKIN SURGERY     back  . UPPER EXTREMITY ANGIOGRAPHY Left 08/01/2017   Procedure: Upper Extremity Angiography;  Surgeon: Conrad Manilla, MD;  Location: [facility];  Service: Cardiovascular;  Laterality: Left;        Home Medications    Prior to Admission medications   Medication Sig Start Date End Date Taking? Authorizing Provider  amLODipine (NORVASC) 5 MG tablet Take 5 mg by mouth at bedtime.  03/23/14   [provider]  amoxicillin-clavulanate (AUGMENTIN) 875-125 MG tablet Take 1 tablet by mouth every 12 (twelve) hours. 12/30/18   Robyn Haber, MD  calcium acetate (PHOSLO) 667 MG capsule Take 1 capsule by mouth 3 (three) times daily with meals.  04/29/17   [provider]  diphenhydramine-acetaminophen (TYLENOL PM) 25-500 MG TABS tablet Take 1 tablet by mouth at bedtime as needed (sleep).    [provider]  escitalopram (LEXAPRO) 10 MG tablet Take 10 mg by mouth at bedtime.  06/14/16   [provider]  levothyroxine (SYNTHROID, LEVOTHROID) 88 MCG tablet Take 88 mcg by mouth at bedtime.  [provider]  lidocaine-prilocaine (EMLA) cream Apply 1 application topically daily as needed (port access).  02/06/17   [provider]  mirabegron ER (MYRBETRIQ) 50 MG TB24 tablet Take 50 mg by mouth at bedtime.     [provider]  multivitamin (RENA-VIT) TABS tablet Take 1 tablet by mouth daily.    [provider]  polyethylene glycol (MIRALAX) packet Take 17 g by mouth daily. 12/30/18   Robyn Haber, MD  polyvinyl alcohol (LIQUIFILM TEARS) 1.4 % ophthalmic solution Place 1-2 drops into both eyes as needed  for dry eyes.    [provider]  ranitidine (ZANTAC) 150 MG capsule Take 150 mg by mouth 2 (two) times daily.    [provider]    Family History Family History  Problem Relation Age of Onset  . Diabetes Father   . Diabetes Brother   . Heart disease Brother   . Colon cancer Neg Hx   . Rectal cancer Neg Hx     Social History Social History   Tobacco Use  . Smoking status: Former Smoker    Last attempt to quit: 01/25/1980    Years since quitting: 39.1  . Smokeless tobacco: Never Used  Substance Use Topics  . Alcohol use: No    Comment: former alcoholic 32 years ago was last drink  . Drug use: No     Allergies   Patient has no known allergies.   Review of Systems Review of Systems  All systems reviewed and negative, other than as noted in HPI.  Physical Exam Updated Vital Signs BP (!) 153/76 (BP Location: Left Arm)   Pulse 73   Temp 98.6 F (37 C) (Oral)   Resp 18   Ht 5\' 8"  (1.727 m)   Wt 78.9 kg   SpO2 97%   BMI 26.46 kg/m   Physical Exam Vitals signs and nursing note reviewed.  Constitutional:      General: He is not in acute distress.    Appearance: He is well-developed.  HENT:     Head: Normocephalic and atraumatic.  Eyes:     General:        Right eye: No discharge.        Left eye: No discharge.     Conjunctiva/sclera: Conjunctivae normal.  Neck:     Musculoskeletal: Neck supple.  Cardiovascular:     Rate and Rhythm: Normal rate and regular rhythm.     Heart sounds: Normal heart sounds. No murmur. No friction rub. No gallop.   Pulmonary:     Effort: Pulmonary effort is normal. No respiratory distress.     Breath sounds: Normal breath sounds.  Abdominal:     General: There is no distension.     Palpations: Abdomen is soft.     Tenderness: There is no abdominal tenderness.  Musculoskeletal:        General: No tenderness.     Comments: Lower extremities symmetric as compared to each other. No calf tenderness. Negative  Homan's. No palpable cords.   Skin:    General: Skin is warm and dry.  Neurological:     Mental Status: He is alert.  Psychiatric:        Behavior: Behavior normal.        Thought Content: Thought content normal.      ED Treatments / Results  Labs (all labs ordered are listed, but only abnormal results are displayed) Labs Reviewed  BASIC METABOLIC PANEL - Abnormal; Notable for the following  components:      Result Value   Chloride 93 (*)    Glucose, Bld 149 (*)    BUN 29 (*)    Creatinine, Ser 4.70 (*)    GFR calc non Af Amer 10 (*)    GFR calc Af Amer 12 (*)    Anion gap 18 (*)    All other components within normal limits  CBC - Abnormal; Notable for the following components:   RBC 3.43 (*)    Hemoglobin 10.9 (*)    HCT 34.5 (*)    MCV 100.6 (*)    All other components within normal limits  TROPONIN I  I-STAT TROPONIN, ED    EKG EKG Interpretation  Date/Time:  Tuesday February 24 2019 17:32:01 EDT Ventricular Rate:  71 PR Interval:    QRS Duration: 146 QT Interval:  462 QTC Calculation: 503 R Axis:   -89 Text Interpretation:  Sinus rhythm Ventricular premature complex RBBB and LAFB LVH by voltage Confirmed by Virgel Manifold [phone]) on 02/24/2019 6:00:29 PM Also confirmed by Virgel Manifold [phone]), editor Philomena Doheny [phone])  on 02/25/2019 7:05:07 AM   Radiology Dg Chest 2 View  Result Date: 02/24/2019 CLINICAL DATA:  Chest in stage renal disease on dialysis. EXAM: CHEST - 2 VIEW COMPARISON:  None. FINDINGS: Heart size is within limits. Ectasia atherosclerotic calcification thoracic aorta is noted. A small nodular density is seen in the right base. No evidence of pulmonary infiltrate, edema, or pleural effusion. IMPRESSION: Small nodular density in right lung base. Recommend chest CT without contrast to exclude pulmonary neoplasm. Electronically Signed   By: Earle Gell M.D.   On: 02/24/2019 19:09    Procedures Procedures (including critical care time)   Medications Ordered in ED Medications  morphine 4 MG/ML injection 4 mg (has no administration in time range)  LORazepam (ATIVAN) injection 0.5 mg (has no administration in time range)     Initial Impression / Assessment and Plan / ED Course  I have reviewed the triage vital signs and the nursing notes.  Pertinent labs & imaging results that were available during my care of the patient were reviewed by me and considered in my medical decision making (see chart for details).   89yM with chest pressure radiating up to shoulder and face. Seems atypical for ACS. Trop normal x2. Doubt PE, dissection or other emergent process. Nodule noted. I doubt contributory to current symptoms. Outpt FU for further imaging.   It has been determined that no acute conditions requiring further emergency intervention are present at this time. The patient has been advised of the diagnosis and plan. I reviewed any labs and imaging including any potential incidental findings (lung density). I have reviewed nursing notes and appropriate previous records. We have discussed signs and symptoms that warrant return to the ED and they are listed in the discharge instructions.      Final Clinical Impressions(s) / ED Diagnoses   Final diagnoses:  Chest discomfort    ED Discharge Orders    None       Virgel Manifold, MD 02/25/19 [phone]

## 2019-02-24 NOTE — ED Notes (Signed)
Patient niece called, she is in the parking lot- [phone]

## 2019-02-24 NOTE — ED Notes (Signed)
Patient transported to X-ray 

## 2019-02-24 NOTE — ED Notes (Signed)
Pt states that he took 2 tylenol right before coming to ED.

## 2019-02-24 NOTE — ED Triage Notes (Signed)
Pt c/o chest pain, headache, pain around the eyes and neck pains that started around 3pm today. Pt reports had dialysis on M/W/F.

## 2019-02-25 DIAGNOSIS — N2581 Secondary hyperparathyroidism of renal origin: Secondary | ICD-10-CM | POA: Diagnosis not present

## 2019-02-25 DIAGNOSIS — D509 Iron deficiency anemia, unspecified: Secondary | ICD-10-CM | POA: Diagnosis not present

## 2019-02-25 DIAGNOSIS — D631 Anemia in chronic kidney disease: Secondary | ICD-10-CM | POA: Diagnosis not present

## 2019-02-25 DIAGNOSIS — N186 End stage renal disease: Secondary | ICD-10-CM | POA: Diagnosis not present

## 2019-02-27 DIAGNOSIS — N186 End stage renal disease: Secondary | ICD-10-CM | POA: Diagnosis not present

## 2019-02-27 DIAGNOSIS — D509 Iron deficiency anemia, unspecified: Secondary | ICD-10-CM | POA: Diagnosis not present

## 2019-02-27 DIAGNOSIS — N2581 Secondary hyperparathyroidism of renal origin: Secondary | ICD-10-CM | POA: Diagnosis not present

## 2019-02-27 DIAGNOSIS — D631 Anemia in chronic kidney disease: Secondary | ICD-10-CM | POA: Diagnosis not present

## 2019-03-02 DIAGNOSIS — N186 End stage renal disease: Secondary | ICD-10-CM | POA: Diagnosis not present

## 2019-03-02 DIAGNOSIS — D631 Anemia in chronic kidney disease: Secondary | ICD-10-CM | POA: Diagnosis not present

## 2019-03-02 DIAGNOSIS — D509 Iron deficiency anemia, unspecified: Secondary | ICD-10-CM | POA: Diagnosis not present

## 2019-03-02 DIAGNOSIS — N2581 Secondary hyperparathyroidism of renal origin: Secondary | ICD-10-CM | POA: Diagnosis not present

## 2019-03-04 DIAGNOSIS — D631 Anemia in chronic kidney disease: Secondary | ICD-10-CM | POA: Diagnosis not present

## 2019-03-04 DIAGNOSIS — N186 End stage renal disease: Secondary | ICD-10-CM | POA: Diagnosis not present

## 2019-03-04 DIAGNOSIS — N2581 Secondary hyperparathyroidism of renal origin: Secondary | ICD-10-CM | POA: Diagnosis not present

## 2019-03-04 DIAGNOSIS — D509 Iron deficiency anemia, unspecified: Secondary | ICD-10-CM | POA: Diagnosis not present

## 2019-03-04 DIAGNOSIS — Z992 Dependence on renal dialysis: Secondary | ICD-10-CM | POA: Diagnosis not present

## 2019-03-04 DIAGNOSIS — I129 Hypertensive chronic kidney disease with stage 1 through stage 4 chronic kidney disease, or unspecified chronic kidney disease: Secondary | ICD-10-CM | POA: Diagnosis not present

## 2019-03-06 DIAGNOSIS — D631 Anemia in chronic kidney disease: Secondary | ICD-10-CM | POA: Diagnosis not present

## 2019-03-06 DIAGNOSIS — N186 End stage renal disease: Secondary | ICD-10-CM | POA: Diagnosis not present

## 2019-03-06 DIAGNOSIS — D509 Iron deficiency anemia, unspecified: Secondary | ICD-10-CM | POA: Diagnosis not present

## 2019-03-06 DIAGNOSIS — N2581 Secondary hyperparathyroidism of renal origin: Secondary | ICD-10-CM | POA: Diagnosis not present

## 2019-03-09 DIAGNOSIS — F329 Major depressive disorder, single episode, unspecified: Secondary | ICD-10-CM | POA: Diagnosis not present

## 2019-03-09 DIAGNOSIS — I120 Hypertensive chronic kidney disease with stage 5 chronic kidney disease or end stage renal disease: Secondary | ICD-10-CM | POA: Diagnosis not present

## 2019-03-09 DIAGNOSIS — R0789 Other chest pain: Secondary | ICD-10-CM | POA: Diagnosis not present

## 2019-03-09 DIAGNOSIS — D509 Iron deficiency anemia, unspecified: Secondary | ICD-10-CM | POA: Diagnosis not present

## 2019-03-09 DIAGNOSIS — D631 Anemia in chronic kidney disease: Secondary | ICD-10-CM | POA: Diagnosis not present

## 2019-03-09 DIAGNOSIS — R413 Other amnesia: Secondary | ICD-10-CM | POA: Diagnosis not present

## 2019-03-09 DIAGNOSIS — N186 End stage renal disease: Secondary | ICD-10-CM | POA: Diagnosis not present

## 2019-03-09 DIAGNOSIS — N184 Chronic kidney disease, stage 4 (severe): Secondary | ICD-10-CM | POA: Diagnosis not present

## 2019-03-09 DIAGNOSIS — Z1331 Encounter for screening for depression: Secondary | ICD-10-CM | POA: Diagnosis not present

## 2019-03-09 DIAGNOSIS — N2581 Secondary hyperparathyroidism of renal origin: Secondary | ICD-10-CM | POA: Diagnosis not present

## 2019-03-11 DIAGNOSIS — N2581 Secondary hyperparathyroidism of renal origin: Secondary | ICD-10-CM | POA: Diagnosis not present

## 2019-03-11 DIAGNOSIS — N186 End stage renal disease: Secondary | ICD-10-CM | POA: Diagnosis not present

## 2019-03-11 DIAGNOSIS — D509 Iron deficiency anemia, unspecified: Secondary | ICD-10-CM | POA: Diagnosis not present

## 2019-03-11 DIAGNOSIS — D631 Anemia in chronic kidney disease: Secondary | ICD-10-CM | POA: Diagnosis not present

## 2019-03-13 DIAGNOSIS — D509 Iron deficiency anemia, unspecified: Secondary | ICD-10-CM | POA: Diagnosis not present

## 2019-03-13 DIAGNOSIS — N2581 Secondary hyperparathyroidism of renal origin: Secondary | ICD-10-CM | POA: Diagnosis not present

## 2019-03-13 DIAGNOSIS — N186 End stage renal disease: Secondary | ICD-10-CM | POA: Diagnosis not present

## 2019-03-13 DIAGNOSIS — D631 Anemia in chronic kidney disease: Secondary | ICD-10-CM | POA: Diagnosis not present

## 2019-03-16 DIAGNOSIS — D509 Iron deficiency anemia, unspecified: Secondary | ICD-10-CM | POA: Diagnosis not present

## 2019-03-16 DIAGNOSIS — D631 Anemia in chronic kidney disease: Secondary | ICD-10-CM | POA: Diagnosis not present

## 2019-03-16 DIAGNOSIS — N186 End stage renal disease: Secondary | ICD-10-CM | POA: Diagnosis not present

## 2019-03-16 DIAGNOSIS — N2581 Secondary hyperparathyroidism of renal origin: Secondary | ICD-10-CM | POA: Diagnosis not present

## 2019-03-18 DIAGNOSIS — D631 Anemia in chronic kidney disease: Secondary | ICD-10-CM | POA: Diagnosis not present

## 2019-03-18 DIAGNOSIS — N186 End stage renal disease: Secondary | ICD-10-CM | POA: Diagnosis not present

## 2019-03-18 DIAGNOSIS — D509 Iron deficiency anemia, unspecified: Secondary | ICD-10-CM | POA: Diagnosis not present

## 2019-03-18 DIAGNOSIS — N2581 Secondary hyperparathyroidism of renal origin: Secondary | ICD-10-CM | POA: Diagnosis not present

## 2019-03-20 DIAGNOSIS — D631 Anemia in chronic kidney disease: Secondary | ICD-10-CM | POA: Diagnosis not present

## 2019-03-20 DIAGNOSIS — N2581 Secondary hyperparathyroidism of renal origin: Secondary | ICD-10-CM | POA: Diagnosis not present

## 2019-03-20 DIAGNOSIS — D509 Iron deficiency anemia, unspecified: Secondary | ICD-10-CM | POA: Diagnosis not present

## 2019-03-20 DIAGNOSIS — N186 End stage renal disease: Secondary | ICD-10-CM | POA: Diagnosis not present

## 2019-03-23 DIAGNOSIS — N186 End stage renal disease: Secondary | ICD-10-CM | POA: Diagnosis not present

## 2019-03-23 DIAGNOSIS — N2581 Secondary hyperparathyroidism of renal origin: Secondary | ICD-10-CM | POA: Diagnosis not present

## 2019-03-23 DIAGNOSIS — D509 Iron deficiency anemia, unspecified: Secondary | ICD-10-CM | POA: Diagnosis not present

## 2019-03-23 DIAGNOSIS — D631 Anemia in chronic kidney disease: Secondary | ICD-10-CM | POA: Diagnosis not present

## 2019-03-25 DIAGNOSIS — D631 Anemia in chronic kidney disease: Secondary | ICD-10-CM | POA: Diagnosis not present

## 2019-03-25 DIAGNOSIS — N186 End stage renal disease: Secondary | ICD-10-CM | POA: Diagnosis not present

## 2019-03-25 DIAGNOSIS — D509 Iron deficiency anemia, unspecified: Secondary | ICD-10-CM | POA: Diagnosis not present

## 2019-03-25 DIAGNOSIS — N2581 Secondary hyperparathyroidism of renal origin: Secondary | ICD-10-CM | POA: Diagnosis not present

## 2019-03-27 DIAGNOSIS — D509 Iron deficiency anemia, unspecified: Secondary | ICD-10-CM | POA: Diagnosis not present

## 2019-03-27 DIAGNOSIS — D631 Anemia in chronic kidney disease: Secondary | ICD-10-CM | POA: Diagnosis not present

## 2019-03-27 DIAGNOSIS — N2581 Secondary hyperparathyroidism of renal origin: Secondary | ICD-10-CM | POA: Diagnosis not present

## 2019-03-27 DIAGNOSIS — N186 End stage renal disease: Secondary | ICD-10-CM | POA: Diagnosis not present

## 2019-03-30 DIAGNOSIS — N186 End stage renal disease: Secondary | ICD-10-CM | POA: Diagnosis not present

## 2019-03-30 DIAGNOSIS — N2581 Secondary hyperparathyroidism of renal origin: Secondary | ICD-10-CM | POA: Diagnosis not present

## 2019-03-30 DIAGNOSIS — D509 Iron deficiency anemia, unspecified: Secondary | ICD-10-CM | POA: Diagnosis not present

## 2019-03-30 DIAGNOSIS — D631 Anemia in chronic kidney disease: Secondary | ICD-10-CM | POA: Diagnosis not present

## 2019-04-01 DIAGNOSIS — N2581 Secondary hyperparathyroidism of renal origin: Secondary | ICD-10-CM | POA: Diagnosis not present

## 2019-04-01 DIAGNOSIS — D509 Iron deficiency anemia, unspecified: Secondary | ICD-10-CM | POA: Diagnosis not present

## 2019-04-01 DIAGNOSIS — N186 End stage renal disease: Secondary | ICD-10-CM | POA: Diagnosis not present

## 2019-04-01 DIAGNOSIS — D631 Anemia in chronic kidney disease: Secondary | ICD-10-CM | POA: Diagnosis not present

## 2019-04-03 DIAGNOSIS — Z992 Dependence on renal dialysis: Secondary | ICD-10-CM | POA: Diagnosis not present

## 2019-04-03 DIAGNOSIS — N186 End stage renal disease: Secondary | ICD-10-CM | POA: Diagnosis not present

## 2019-04-03 DIAGNOSIS — D631 Anemia in chronic kidney disease: Secondary | ICD-10-CM | POA: Diagnosis not present

## 2019-04-03 DIAGNOSIS — D509 Iron deficiency anemia, unspecified: Secondary | ICD-10-CM | POA: Diagnosis not present

## 2019-04-03 DIAGNOSIS — N2581 Secondary hyperparathyroidism of renal origin: Secondary | ICD-10-CM | POA: Diagnosis not present

## 2019-04-03 DIAGNOSIS — I129 Hypertensive chronic kidney disease with stage 1 through stage 4 chronic kidney disease, or unspecified chronic kidney disease: Secondary | ICD-10-CM | POA: Diagnosis not present

## 2019-04-06 DIAGNOSIS — N186 End stage renal disease: Secondary | ICD-10-CM | POA: Diagnosis not present

## 2019-04-06 DIAGNOSIS — D509 Iron deficiency anemia, unspecified: Secondary | ICD-10-CM | POA: Diagnosis not present

## 2019-04-06 DIAGNOSIS — D631 Anemia in chronic kidney disease: Secondary | ICD-10-CM | POA: Diagnosis not present

## 2019-04-06 DIAGNOSIS — N2581 Secondary hyperparathyroidism of renal origin: Secondary | ICD-10-CM | POA: Diagnosis not present

## 2019-04-08 DIAGNOSIS — N186 End stage renal disease: Secondary | ICD-10-CM | POA: Diagnosis not present

## 2019-04-08 DIAGNOSIS — D631 Anemia in chronic kidney disease: Secondary | ICD-10-CM | POA: Diagnosis not present

## 2019-04-08 DIAGNOSIS — D509 Iron deficiency anemia, unspecified: Secondary | ICD-10-CM | POA: Diagnosis not present

## 2019-04-08 DIAGNOSIS — N2581 Secondary hyperparathyroidism of renal origin: Secondary | ICD-10-CM | POA: Diagnosis not present

## 2019-04-10 DIAGNOSIS — D509 Iron deficiency anemia, unspecified: Secondary | ICD-10-CM | POA: Diagnosis not present

## 2019-04-10 DIAGNOSIS — N2581 Secondary hyperparathyroidism of renal origin: Secondary | ICD-10-CM | POA: Diagnosis not present

## 2019-04-10 DIAGNOSIS — N186 End stage renal disease: Secondary | ICD-10-CM | POA: Diagnosis not present

## 2019-04-10 DIAGNOSIS — D631 Anemia in chronic kidney disease: Secondary | ICD-10-CM | POA: Diagnosis not present

## 2019-04-13 DIAGNOSIS — N186 End stage renal disease: Secondary | ICD-10-CM | POA: Diagnosis not present

## 2019-04-13 DIAGNOSIS — D509 Iron deficiency anemia, unspecified: Secondary | ICD-10-CM | POA: Diagnosis not present

## 2019-04-13 DIAGNOSIS — D631 Anemia in chronic kidney disease: Secondary | ICD-10-CM | POA: Diagnosis not present

## 2019-04-13 DIAGNOSIS — N2581 Secondary hyperparathyroidism of renal origin: Secondary | ICD-10-CM | POA: Diagnosis not present

## 2019-04-15 DIAGNOSIS — D509 Iron deficiency anemia, unspecified: Secondary | ICD-10-CM | POA: Diagnosis not present

## 2019-04-15 DIAGNOSIS — N186 End stage renal disease: Secondary | ICD-10-CM | POA: Diagnosis not present

## 2019-04-15 DIAGNOSIS — N2581 Secondary hyperparathyroidism of renal origin: Secondary | ICD-10-CM | POA: Diagnosis not present

## 2019-04-15 DIAGNOSIS — D631 Anemia in chronic kidney disease: Secondary | ICD-10-CM | POA: Diagnosis not present

## 2019-04-17 DIAGNOSIS — N2581 Secondary hyperparathyroidism of renal origin: Secondary | ICD-10-CM | POA: Diagnosis not present

## 2019-04-17 DIAGNOSIS — D631 Anemia in chronic kidney disease: Secondary | ICD-10-CM | POA: Diagnosis not present

## 2019-04-17 DIAGNOSIS — D509 Iron deficiency anemia, unspecified: Secondary | ICD-10-CM | POA: Diagnosis not present

## 2019-04-17 DIAGNOSIS — N186 End stage renal disease: Secondary | ICD-10-CM | POA: Diagnosis not present

## 2019-04-20 DIAGNOSIS — D509 Iron deficiency anemia, unspecified: Secondary | ICD-10-CM | POA: Diagnosis not present

## 2019-04-20 DIAGNOSIS — D631 Anemia in chronic kidney disease: Secondary | ICD-10-CM | POA: Diagnosis not present

## 2019-04-20 DIAGNOSIS — N186 End stage renal disease: Secondary | ICD-10-CM | POA: Diagnosis not present

## 2019-04-20 DIAGNOSIS — N2581 Secondary hyperparathyroidism of renal origin: Secondary | ICD-10-CM | POA: Diagnosis not present

## 2019-04-22 DIAGNOSIS — N2581 Secondary hyperparathyroidism of renal origin: Secondary | ICD-10-CM | POA: Diagnosis not present

## 2019-04-22 DIAGNOSIS — D631 Anemia in chronic kidney disease: Secondary | ICD-10-CM | POA: Diagnosis not present

## 2019-04-22 DIAGNOSIS — N186 End stage renal disease: Secondary | ICD-10-CM | POA: Diagnosis not present

## 2019-04-22 DIAGNOSIS — D509 Iron deficiency anemia, unspecified: Secondary | ICD-10-CM | POA: Diagnosis not present

## 2019-04-24 DIAGNOSIS — N2581 Secondary hyperparathyroidism of renal origin: Secondary | ICD-10-CM | POA: Diagnosis not present

## 2019-04-24 DIAGNOSIS — D509 Iron deficiency anemia, unspecified: Secondary | ICD-10-CM | POA: Diagnosis not present

## 2019-04-24 DIAGNOSIS — D631 Anemia in chronic kidney disease: Secondary | ICD-10-CM | POA: Diagnosis not present

## 2019-04-24 DIAGNOSIS — N186 End stage renal disease: Secondary | ICD-10-CM | POA: Diagnosis not present

## 2019-04-27 DIAGNOSIS — N2581 Secondary hyperparathyroidism of renal origin: Secondary | ICD-10-CM | POA: Diagnosis not present

## 2019-04-27 DIAGNOSIS — D631 Anemia in chronic kidney disease: Secondary | ICD-10-CM | POA: Diagnosis not present

## 2019-04-27 DIAGNOSIS — N186 End stage renal disease: Secondary | ICD-10-CM | POA: Diagnosis not present

## 2019-04-27 DIAGNOSIS — D509 Iron deficiency anemia, unspecified: Secondary | ICD-10-CM | POA: Diagnosis not present

## 2019-04-29 DIAGNOSIS — N2581 Secondary hyperparathyroidism of renal origin: Secondary | ICD-10-CM | POA: Diagnosis not present

## 2019-04-29 DIAGNOSIS — D631 Anemia in chronic kidney disease: Secondary | ICD-10-CM | POA: Diagnosis not present

## 2019-04-29 DIAGNOSIS — D509 Iron deficiency anemia, unspecified: Secondary | ICD-10-CM | POA: Diagnosis not present

## 2019-04-29 DIAGNOSIS — N186 End stage renal disease: Secondary | ICD-10-CM | POA: Diagnosis not present

## 2019-05-01 DIAGNOSIS — D509 Iron deficiency anemia, unspecified: Secondary | ICD-10-CM | POA: Diagnosis not present

## 2019-05-01 DIAGNOSIS — N186 End stage renal disease: Secondary | ICD-10-CM | POA: Diagnosis not present

## 2019-05-01 DIAGNOSIS — N2581 Secondary hyperparathyroidism of renal origin: Secondary | ICD-10-CM | POA: Diagnosis not present

## 2019-05-01 DIAGNOSIS — D631 Anemia in chronic kidney disease: Secondary | ICD-10-CM | POA: Diagnosis not present

## 2019-05-04 DIAGNOSIS — N186 End stage renal disease: Secondary | ICD-10-CM | POA: Diagnosis not present

## 2019-05-04 DIAGNOSIS — Z992 Dependence on renal dialysis: Secondary | ICD-10-CM | POA: Diagnosis not present

## 2019-05-04 DIAGNOSIS — I129 Hypertensive chronic kidney disease with stage 1 through stage 4 chronic kidney disease, or unspecified chronic kidney disease: Secondary | ICD-10-CM | POA: Diagnosis not present

## 2019-05-04 DIAGNOSIS — N2581 Secondary hyperparathyroidism of renal origin: Secondary | ICD-10-CM | POA: Diagnosis not present

## 2019-05-04 DIAGNOSIS — D509 Iron deficiency anemia, unspecified: Secondary | ICD-10-CM | POA: Diagnosis not present

## 2019-05-04 DIAGNOSIS — D631 Anemia in chronic kidney disease: Secondary | ICD-10-CM | POA: Diagnosis not present

## 2019-05-06 DIAGNOSIS — D509 Iron deficiency anemia, unspecified: Secondary | ICD-10-CM | POA: Diagnosis not present

## 2019-05-06 DIAGNOSIS — D631 Anemia in chronic kidney disease: Secondary | ICD-10-CM | POA: Diagnosis not present

## 2019-05-06 DIAGNOSIS — N186 End stage renal disease: Secondary | ICD-10-CM | POA: Diagnosis not present

## 2019-05-06 DIAGNOSIS — N2581 Secondary hyperparathyroidism of renal origin: Secondary | ICD-10-CM | POA: Diagnosis not present

## 2019-05-08 DIAGNOSIS — N2581 Secondary hyperparathyroidism of renal origin: Secondary | ICD-10-CM | POA: Diagnosis not present

## 2019-05-08 DIAGNOSIS — D631 Anemia in chronic kidney disease: Secondary | ICD-10-CM | POA: Diagnosis not present

## 2019-05-08 DIAGNOSIS — D509 Iron deficiency anemia, unspecified: Secondary | ICD-10-CM | POA: Diagnosis not present

## 2019-05-08 DIAGNOSIS — N186 End stage renal disease: Secondary | ICD-10-CM | POA: Diagnosis not present

## 2019-05-11 DIAGNOSIS — D631 Anemia in chronic kidney disease: Secondary | ICD-10-CM | POA: Diagnosis not present

## 2019-05-11 DIAGNOSIS — N2581 Secondary hyperparathyroidism of renal origin: Secondary | ICD-10-CM | POA: Diagnosis not present

## 2019-05-11 DIAGNOSIS — N186 End stage renal disease: Secondary | ICD-10-CM | POA: Diagnosis not present

## 2019-05-11 DIAGNOSIS — D509 Iron deficiency anemia, unspecified: Secondary | ICD-10-CM | POA: Diagnosis not present

## 2019-05-13 DIAGNOSIS — N186 End stage renal disease: Secondary | ICD-10-CM | POA: Diagnosis not present

## 2019-05-13 DIAGNOSIS — D509 Iron deficiency anemia, unspecified: Secondary | ICD-10-CM | POA: Diagnosis not present

## 2019-05-13 DIAGNOSIS — N2581 Secondary hyperparathyroidism of renal origin: Secondary | ICD-10-CM | POA: Diagnosis not present

## 2019-05-13 DIAGNOSIS — D631 Anemia in chronic kidney disease: Secondary | ICD-10-CM | POA: Diagnosis not present

## 2019-05-15 DIAGNOSIS — N2581 Secondary hyperparathyroidism of renal origin: Secondary | ICD-10-CM | POA: Diagnosis not present

## 2019-05-15 DIAGNOSIS — D631 Anemia in chronic kidney disease: Secondary | ICD-10-CM | POA: Diagnosis not present

## 2019-05-15 DIAGNOSIS — N186 End stage renal disease: Secondary | ICD-10-CM | POA: Diagnosis not present

## 2019-05-15 DIAGNOSIS — D509 Iron deficiency anemia, unspecified: Secondary | ICD-10-CM | POA: Diagnosis not present

## 2019-05-18 DIAGNOSIS — D509 Iron deficiency anemia, unspecified: Secondary | ICD-10-CM | POA: Diagnosis not present

## 2019-05-18 DIAGNOSIS — N2581 Secondary hyperparathyroidism of renal origin: Secondary | ICD-10-CM | POA: Diagnosis not present

## 2019-05-18 DIAGNOSIS — D631 Anemia in chronic kidney disease: Secondary | ICD-10-CM | POA: Diagnosis not present

## 2019-05-18 DIAGNOSIS — N186 End stage renal disease: Secondary | ICD-10-CM | POA: Diagnosis not present

## 2019-05-20 DIAGNOSIS — N186 End stage renal disease: Secondary | ICD-10-CM | POA: Diagnosis not present

## 2019-05-20 DIAGNOSIS — D509 Iron deficiency anemia, unspecified: Secondary | ICD-10-CM | POA: Diagnosis not present

## 2019-05-20 DIAGNOSIS — N2581 Secondary hyperparathyroidism of renal origin: Secondary | ICD-10-CM | POA: Diagnosis not present

## 2019-05-20 DIAGNOSIS — D631 Anemia in chronic kidney disease: Secondary | ICD-10-CM | POA: Diagnosis not present

## 2019-05-22 DIAGNOSIS — N186 End stage renal disease: Secondary | ICD-10-CM | POA: Diagnosis not present

## 2019-05-22 DIAGNOSIS — D631 Anemia in chronic kidney disease: Secondary | ICD-10-CM | POA: Diagnosis not present

## 2019-05-22 DIAGNOSIS — N2581 Secondary hyperparathyroidism of renal origin: Secondary | ICD-10-CM | POA: Diagnosis not present

## 2019-05-22 DIAGNOSIS — D509 Iron deficiency anemia, unspecified: Secondary | ICD-10-CM | POA: Diagnosis not present

## 2019-05-25 DIAGNOSIS — D631 Anemia in chronic kidney disease: Secondary | ICD-10-CM | POA: Diagnosis not present

## 2019-05-25 DIAGNOSIS — N186 End stage renal disease: Secondary | ICD-10-CM | POA: Diagnosis not present

## 2019-05-25 DIAGNOSIS — D509 Iron deficiency anemia, unspecified: Secondary | ICD-10-CM | POA: Diagnosis not present

## 2019-05-25 DIAGNOSIS — N2581 Secondary hyperparathyroidism of renal origin: Secondary | ICD-10-CM | POA: Diagnosis not present

## 2019-05-27 DIAGNOSIS — D631 Anemia in chronic kidney disease: Secondary | ICD-10-CM | POA: Diagnosis not present

## 2019-05-27 DIAGNOSIS — D509 Iron deficiency anemia, unspecified: Secondary | ICD-10-CM | POA: Diagnosis not present

## 2019-05-27 DIAGNOSIS — N186 End stage renal disease: Secondary | ICD-10-CM | POA: Diagnosis not present

## 2019-05-27 DIAGNOSIS — N2581 Secondary hyperparathyroidism of renal origin: Secondary | ICD-10-CM | POA: Diagnosis not present

## 2019-05-29 DIAGNOSIS — N2581 Secondary hyperparathyroidism of renal origin: Secondary | ICD-10-CM | POA: Diagnosis not present

## 2019-05-29 DIAGNOSIS — D631 Anemia in chronic kidney disease: Secondary | ICD-10-CM | POA: Diagnosis not present

## 2019-05-29 DIAGNOSIS — N186 End stage renal disease: Secondary | ICD-10-CM | POA: Diagnosis not present

## 2019-05-29 DIAGNOSIS — D509 Iron deficiency anemia, unspecified: Secondary | ICD-10-CM | POA: Diagnosis not present

## 2019-06-01 DIAGNOSIS — D509 Iron deficiency anemia, unspecified: Secondary | ICD-10-CM | POA: Diagnosis not present

## 2019-06-01 DIAGNOSIS — N2581 Secondary hyperparathyroidism of renal origin: Secondary | ICD-10-CM | POA: Diagnosis not present

## 2019-06-01 DIAGNOSIS — D631 Anemia in chronic kidney disease: Secondary | ICD-10-CM | POA: Diagnosis not present

## 2019-06-01 DIAGNOSIS — N186 End stage renal disease: Secondary | ICD-10-CM | POA: Diagnosis not present

## 2019-06-03 DIAGNOSIS — Z992 Dependence on renal dialysis: Secondary | ICD-10-CM | POA: Diagnosis not present

## 2019-06-03 DIAGNOSIS — D509 Iron deficiency anemia, unspecified: Secondary | ICD-10-CM | POA: Diagnosis not present

## 2019-06-03 DIAGNOSIS — N186 End stage renal disease: Secondary | ICD-10-CM | POA: Diagnosis not present

## 2019-06-03 DIAGNOSIS — D631 Anemia in chronic kidney disease: Secondary | ICD-10-CM | POA: Diagnosis not present

## 2019-06-03 DIAGNOSIS — N2581 Secondary hyperparathyroidism of renal origin: Secondary | ICD-10-CM | POA: Diagnosis not present

## 2019-06-03 DIAGNOSIS — I129 Hypertensive chronic kidney disease with stage 1 through stage 4 chronic kidney disease, or unspecified chronic kidney disease: Secondary | ICD-10-CM | POA: Diagnosis not present

## 2019-06-05 DIAGNOSIS — D509 Iron deficiency anemia, unspecified: Secondary | ICD-10-CM | POA: Diagnosis not present

## 2019-06-05 DIAGNOSIS — D631 Anemia in chronic kidney disease: Secondary | ICD-10-CM | POA: Diagnosis not present

## 2019-06-05 DIAGNOSIS — N186 End stage renal disease: Secondary | ICD-10-CM | POA: Diagnosis not present

## 2019-06-05 DIAGNOSIS — N2581 Secondary hyperparathyroidism of renal origin: Secondary | ICD-10-CM | POA: Diagnosis not present

## 2019-06-08 DIAGNOSIS — N2581 Secondary hyperparathyroidism of renal origin: Secondary | ICD-10-CM | POA: Diagnosis not present

## 2019-06-08 DIAGNOSIS — N186 End stage renal disease: Secondary | ICD-10-CM | POA: Diagnosis not present

## 2019-06-08 DIAGNOSIS — D509 Iron deficiency anemia, unspecified: Secondary | ICD-10-CM | POA: Diagnosis not present

## 2019-06-08 DIAGNOSIS — D631 Anemia in chronic kidney disease: Secondary | ICD-10-CM | POA: Diagnosis not present

## 2019-06-10 DIAGNOSIS — D631 Anemia in chronic kidney disease: Secondary | ICD-10-CM | POA: Diagnosis not present

## 2019-06-10 DIAGNOSIS — N2581 Secondary hyperparathyroidism of renal origin: Secondary | ICD-10-CM | POA: Diagnosis not present

## 2019-06-10 DIAGNOSIS — D509 Iron deficiency anemia, unspecified: Secondary | ICD-10-CM | POA: Diagnosis not present

## 2019-06-10 DIAGNOSIS — N186 End stage renal disease: Secondary | ICD-10-CM | POA: Diagnosis not present

## 2019-06-12 DIAGNOSIS — N2581 Secondary hyperparathyroidism of renal origin: Secondary | ICD-10-CM | POA: Diagnosis not present

## 2019-06-12 DIAGNOSIS — N186 End stage renal disease: Secondary | ICD-10-CM | POA: Diagnosis not present

## 2019-06-12 DIAGNOSIS — D631 Anemia in chronic kidney disease: Secondary | ICD-10-CM | POA: Diagnosis not present

## 2019-06-12 DIAGNOSIS — D509 Iron deficiency anemia, unspecified: Secondary | ICD-10-CM | POA: Diagnosis not present

## 2019-06-15 DIAGNOSIS — N2581 Secondary hyperparathyroidism of renal origin: Secondary | ICD-10-CM | POA: Diagnosis not present

## 2019-06-15 DIAGNOSIS — Z125 Encounter for screening for malignant neoplasm of prostate: Secondary | ICD-10-CM | POA: Diagnosis not present

## 2019-06-15 DIAGNOSIS — D631 Anemia in chronic kidney disease: Secondary | ICD-10-CM | POA: Diagnosis not present

## 2019-06-15 DIAGNOSIS — D509 Iron deficiency anemia, unspecified: Secondary | ICD-10-CM | POA: Diagnosis not present

## 2019-06-15 DIAGNOSIS — E038 Other specified hypothyroidism: Secondary | ICD-10-CM | POA: Diagnosis not present

## 2019-06-15 DIAGNOSIS — E559 Vitamin D deficiency, unspecified: Secondary | ICD-10-CM | POA: Diagnosis not present

## 2019-06-15 DIAGNOSIS — N186 End stage renal disease: Secondary | ICD-10-CM | POA: Diagnosis not present

## 2019-06-15 DIAGNOSIS — I120 Hypertensive chronic kidney disease with stage 5 chronic kidney disease or end stage renal disease: Secondary | ICD-10-CM | POA: Diagnosis not present

## 2019-06-16 DIAGNOSIS — I120 Hypertensive chronic kidney disease with stage 5 chronic kidney disease or end stage renal disease: Secondary | ICD-10-CM | POA: Diagnosis not present

## 2019-06-16 DIAGNOSIS — R82998 Other abnormal findings in urine: Secondary | ICD-10-CM | POA: Diagnosis not present

## 2019-06-17 DIAGNOSIS — D631 Anemia in chronic kidney disease: Secondary | ICD-10-CM | POA: Diagnosis not present

## 2019-06-17 DIAGNOSIS — N186 End stage renal disease: Secondary | ICD-10-CM | POA: Diagnosis not present

## 2019-06-17 DIAGNOSIS — N2581 Secondary hyperparathyroidism of renal origin: Secondary | ICD-10-CM | POA: Diagnosis not present

## 2019-06-17 DIAGNOSIS — D509 Iron deficiency anemia, unspecified: Secondary | ICD-10-CM | POA: Diagnosis not present

## 2019-06-18 DIAGNOSIS — N400 Enlarged prostate without lower urinary tract symptoms: Secondary | ICD-10-CM | POA: Diagnosis not present

## 2019-06-18 DIAGNOSIS — R413 Other amnesia: Secondary | ICD-10-CM | POA: Diagnosis not present

## 2019-06-18 DIAGNOSIS — E039 Hypothyroidism, unspecified: Secondary | ICD-10-CM | POA: Diagnosis not present

## 2019-06-18 DIAGNOSIS — Z0000 Encounter for general adult medical examination without abnormal findings: Secondary | ICD-10-CM | POA: Diagnosis not present

## 2019-06-18 DIAGNOSIS — I120 Hypertensive chronic kidney disease with stage 5 chronic kidney disease or end stage renal disease: Secondary | ICD-10-CM | POA: Diagnosis not present

## 2019-06-18 DIAGNOSIS — L8941 Pressure ulcer of contiguous site of back, buttock and hip, stage 1: Secondary | ICD-10-CM | POA: Diagnosis not present

## 2019-06-18 DIAGNOSIS — F329 Major depressive disorder, single episode, unspecified: Secondary | ICD-10-CM | POA: Diagnosis not present

## 2019-06-18 DIAGNOSIS — R0789 Other chest pain: Secondary | ICD-10-CM | POA: Diagnosis not present

## 2019-06-18 DIAGNOSIS — K219 Gastro-esophageal reflux disease without esophagitis: Secondary | ICD-10-CM | POA: Diagnosis not present

## 2019-06-18 DIAGNOSIS — N184 Chronic kidney disease, stage 4 (severe): Secondary | ICD-10-CM | POA: Diagnosis not present

## 2019-06-18 DIAGNOSIS — R634 Abnormal weight loss: Secondary | ICD-10-CM | POA: Diagnosis not present

## 2019-06-18 DIAGNOSIS — D649 Anemia, unspecified: Secondary | ICD-10-CM | POA: Diagnosis not present

## 2019-06-19 DIAGNOSIS — N186 End stage renal disease: Secondary | ICD-10-CM | POA: Diagnosis not present

## 2019-06-19 DIAGNOSIS — N2581 Secondary hyperparathyroidism of renal origin: Secondary | ICD-10-CM | POA: Diagnosis not present

## 2019-06-19 DIAGNOSIS — D509 Iron deficiency anemia, unspecified: Secondary | ICD-10-CM | POA: Diagnosis not present

## 2019-06-19 DIAGNOSIS — D631 Anemia in chronic kidney disease: Secondary | ICD-10-CM | POA: Diagnosis not present

## 2019-06-22 DIAGNOSIS — D631 Anemia in chronic kidney disease: Secondary | ICD-10-CM | POA: Diagnosis not present

## 2019-06-22 DIAGNOSIS — D509 Iron deficiency anemia, unspecified: Secondary | ICD-10-CM | POA: Diagnosis not present

## 2019-06-22 DIAGNOSIS — N2581 Secondary hyperparathyroidism of renal origin: Secondary | ICD-10-CM | POA: Diagnosis not present

## 2019-06-22 DIAGNOSIS — N186 End stage renal disease: Secondary | ICD-10-CM | POA: Diagnosis not present

## 2019-06-24 DIAGNOSIS — N2581 Secondary hyperparathyroidism of renal origin: Secondary | ICD-10-CM | POA: Diagnosis not present

## 2019-06-24 DIAGNOSIS — D631 Anemia in chronic kidney disease: Secondary | ICD-10-CM | POA: Diagnosis not present

## 2019-06-24 DIAGNOSIS — D509 Iron deficiency anemia, unspecified: Secondary | ICD-10-CM | POA: Diagnosis not present

## 2019-06-24 DIAGNOSIS — N186 End stage renal disease: Secondary | ICD-10-CM | POA: Diagnosis not present

## 2019-06-26 DIAGNOSIS — D509 Iron deficiency anemia, unspecified: Secondary | ICD-10-CM | POA: Diagnosis not present

## 2019-06-26 DIAGNOSIS — D631 Anemia in chronic kidney disease: Secondary | ICD-10-CM | POA: Diagnosis not present

## 2019-06-26 DIAGNOSIS — N186 End stage renal disease: Secondary | ICD-10-CM | POA: Diagnosis not present

## 2019-06-26 DIAGNOSIS — N2581 Secondary hyperparathyroidism of renal origin: Secondary | ICD-10-CM | POA: Diagnosis not present

## 2019-06-29 DIAGNOSIS — D631 Anemia in chronic kidney disease: Secondary | ICD-10-CM | POA: Diagnosis not present

## 2019-06-29 DIAGNOSIS — D509 Iron deficiency anemia, unspecified: Secondary | ICD-10-CM | POA: Diagnosis not present

## 2019-06-29 DIAGNOSIS — N2581 Secondary hyperparathyroidism of renal origin: Secondary | ICD-10-CM | POA: Diagnosis not present

## 2019-06-29 DIAGNOSIS — N186 End stage renal disease: Secondary | ICD-10-CM | POA: Diagnosis not present

## 2019-07-01 DIAGNOSIS — N186 End stage renal disease: Secondary | ICD-10-CM | POA: Diagnosis not present

## 2019-07-01 DIAGNOSIS — D509 Iron deficiency anemia, unspecified: Secondary | ICD-10-CM | POA: Diagnosis not present

## 2019-07-01 DIAGNOSIS — D631 Anemia in chronic kidney disease: Secondary | ICD-10-CM | POA: Diagnosis not present

## 2019-07-01 DIAGNOSIS — N2581 Secondary hyperparathyroidism of renal origin: Secondary | ICD-10-CM | POA: Diagnosis not present

## 2019-07-03 DIAGNOSIS — N186 End stage renal disease: Secondary | ICD-10-CM | POA: Diagnosis not present

## 2019-07-03 DIAGNOSIS — N2581 Secondary hyperparathyroidism of renal origin: Secondary | ICD-10-CM | POA: Diagnosis not present

## 2019-07-03 DIAGNOSIS — D509 Iron deficiency anemia, unspecified: Secondary | ICD-10-CM | POA: Diagnosis not present

## 2019-07-03 DIAGNOSIS — D631 Anemia in chronic kidney disease: Secondary | ICD-10-CM | POA: Diagnosis not present

## 2019-07-04 DIAGNOSIS — I129 Hypertensive chronic kidney disease with stage 1 through stage 4 chronic kidney disease, or unspecified chronic kidney disease: Secondary | ICD-10-CM | POA: Diagnosis not present

## 2019-07-04 DIAGNOSIS — N186 End stage renal disease: Secondary | ICD-10-CM | POA: Diagnosis not present

## 2019-07-04 DIAGNOSIS — Z992 Dependence on renal dialysis: Secondary | ICD-10-CM | POA: Diagnosis not present

## 2019-07-06 DIAGNOSIS — N186 End stage renal disease: Secondary | ICD-10-CM | POA: Diagnosis not present

## 2019-07-06 DIAGNOSIS — D631 Anemia in chronic kidney disease: Secondary | ICD-10-CM | POA: Diagnosis not present

## 2019-07-06 DIAGNOSIS — D509 Iron deficiency anemia, unspecified: Secondary | ICD-10-CM | POA: Diagnosis not present

## 2019-07-06 DIAGNOSIS — N2581 Secondary hyperparathyroidism of renal origin: Secondary | ICD-10-CM | POA: Diagnosis not present

## 2019-07-06 DIAGNOSIS — Z992 Dependence on renal dialysis: Secondary | ICD-10-CM | POA: Diagnosis not present

## 2019-07-08 DIAGNOSIS — Z992 Dependence on renal dialysis: Secondary | ICD-10-CM | POA: Diagnosis not present

## 2019-07-08 DIAGNOSIS — D509 Iron deficiency anemia, unspecified: Secondary | ICD-10-CM | POA: Diagnosis not present

## 2019-07-08 DIAGNOSIS — N2581 Secondary hyperparathyroidism of renal origin: Secondary | ICD-10-CM | POA: Diagnosis not present

## 2019-07-08 DIAGNOSIS — D631 Anemia in chronic kidney disease: Secondary | ICD-10-CM | POA: Diagnosis not present

## 2019-07-08 DIAGNOSIS — N186 End stage renal disease: Secondary | ICD-10-CM | POA: Diagnosis not present

## 2019-07-10 DIAGNOSIS — D509 Iron deficiency anemia, unspecified: Secondary | ICD-10-CM | POA: Diagnosis not present

## 2019-07-10 DIAGNOSIS — D631 Anemia in chronic kidney disease: Secondary | ICD-10-CM | POA: Diagnosis not present

## 2019-07-10 DIAGNOSIS — N186 End stage renal disease: Secondary | ICD-10-CM | POA: Diagnosis not present

## 2019-07-10 DIAGNOSIS — N2581 Secondary hyperparathyroidism of renal origin: Secondary | ICD-10-CM | POA: Diagnosis not present

## 2019-07-10 DIAGNOSIS — Z992 Dependence on renal dialysis: Secondary | ICD-10-CM | POA: Diagnosis not present

## 2019-07-13 DIAGNOSIS — N186 End stage renal disease: Secondary | ICD-10-CM | POA: Diagnosis not present

## 2019-07-13 DIAGNOSIS — N2581 Secondary hyperparathyroidism of renal origin: Secondary | ICD-10-CM | POA: Diagnosis not present

## 2019-07-13 DIAGNOSIS — D631 Anemia in chronic kidney disease: Secondary | ICD-10-CM | POA: Diagnosis not present

## 2019-07-13 DIAGNOSIS — D509 Iron deficiency anemia, unspecified: Secondary | ICD-10-CM | POA: Diagnosis not present

## 2019-07-13 DIAGNOSIS — Z992 Dependence on renal dialysis: Secondary | ICD-10-CM | POA: Diagnosis not present

## 2019-07-15 DIAGNOSIS — Z992 Dependence on renal dialysis: Secondary | ICD-10-CM | POA: Diagnosis not present

## 2019-07-15 DIAGNOSIS — N186 End stage renal disease: Secondary | ICD-10-CM | POA: Diagnosis not present

## 2019-07-15 DIAGNOSIS — D631 Anemia in chronic kidney disease: Secondary | ICD-10-CM | POA: Diagnosis not present

## 2019-07-15 DIAGNOSIS — D509 Iron deficiency anemia, unspecified: Secondary | ICD-10-CM | POA: Diagnosis not present

## 2019-07-15 DIAGNOSIS — N2581 Secondary hyperparathyroidism of renal origin: Secondary | ICD-10-CM | POA: Diagnosis not present

## 2019-07-17 DIAGNOSIS — N186 End stage renal disease: Secondary | ICD-10-CM | POA: Diagnosis not present

## 2019-07-17 DIAGNOSIS — Z992 Dependence on renal dialysis: Secondary | ICD-10-CM | POA: Diagnosis not present

## 2019-07-17 DIAGNOSIS — D509 Iron deficiency anemia, unspecified: Secondary | ICD-10-CM | POA: Diagnosis not present

## 2019-07-17 DIAGNOSIS — D631 Anemia in chronic kidney disease: Secondary | ICD-10-CM | POA: Diagnosis not present

## 2019-07-17 DIAGNOSIS — N2581 Secondary hyperparathyroidism of renal origin: Secondary | ICD-10-CM | POA: Diagnosis not present

## 2019-07-20 DIAGNOSIS — N2581 Secondary hyperparathyroidism of renal origin: Secondary | ICD-10-CM | POA: Diagnosis not present

## 2019-07-20 DIAGNOSIS — Z992 Dependence on renal dialysis: Secondary | ICD-10-CM | POA: Diagnosis not present

## 2019-07-20 DIAGNOSIS — D631 Anemia in chronic kidney disease: Secondary | ICD-10-CM | POA: Diagnosis not present

## 2019-07-20 DIAGNOSIS — D509 Iron deficiency anemia, unspecified: Secondary | ICD-10-CM | POA: Diagnosis not present

## 2019-07-20 DIAGNOSIS — N186 End stage renal disease: Secondary | ICD-10-CM | POA: Diagnosis not present

## 2019-07-22 DIAGNOSIS — D509 Iron deficiency anemia, unspecified: Secondary | ICD-10-CM | POA: Diagnosis not present

## 2019-07-22 DIAGNOSIS — N186 End stage renal disease: Secondary | ICD-10-CM | POA: Diagnosis not present

## 2019-07-22 DIAGNOSIS — N2581 Secondary hyperparathyroidism of renal origin: Secondary | ICD-10-CM | POA: Diagnosis not present

## 2019-07-22 DIAGNOSIS — Z992 Dependence on renal dialysis: Secondary | ICD-10-CM | POA: Diagnosis not present

## 2019-07-22 DIAGNOSIS — D631 Anemia in chronic kidney disease: Secondary | ICD-10-CM | POA: Diagnosis not present

## 2019-07-24 DIAGNOSIS — D509 Iron deficiency anemia, unspecified: Secondary | ICD-10-CM | POA: Diagnosis not present

## 2019-07-24 DIAGNOSIS — N2581 Secondary hyperparathyroidism of renal origin: Secondary | ICD-10-CM | POA: Diagnosis not present

## 2019-07-24 DIAGNOSIS — N186 End stage renal disease: Secondary | ICD-10-CM | POA: Diagnosis not present

## 2019-07-24 DIAGNOSIS — D631 Anemia in chronic kidney disease: Secondary | ICD-10-CM | POA: Diagnosis not present

## 2019-07-24 DIAGNOSIS — Z992 Dependence on renal dialysis: Secondary | ICD-10-CM | POA: Diagnosis not present

## 2019-07-27 DIAGNOSIS — Z992 Dependence on renal dialysis: Secondary | ICD-10-CM | POA: Diagnosis not present

## 2019-07-27 DIAGNOSIS — D509 Iron deficiency anemia, unspecified: Secondary | ICD-10-CM | POA: Diagnosis not present

## 2019-07-27 DIAGNOSIS — N2581 Secondary hyperparathyroidism of renal origin: Secondary | ICD-10-CM | POA: Diagnosis not present

## 2019-07-27 DIAGNOSIS — D631 Anemia in chronic kidney disease: Secondary | ICD-10-CM | POA: Diagnosis not present

## 2019-07-27 DIAGNOSIS — N186 End stage renal disease: Secondary | ICD-10-CM | POA: Diagnosis not present

## 2019-07-29 DIAGNOSIS — N2581 Secondary hyperparathyroidism of renal origin: Secondary | ICD-10-CM | POA: Diagnosis not present

## 2019-07-29 DIAGNOSIS — D631 Anemia in chronic kidney disease: Secondary | ICD-10-CM | POA: Diagnosis not present

## 2019-07-29 DIAGNOSIS — Z992 Dependence on renal dialysis: Secondary | ICD-10-CM | POA: Diagnosis not present

## 2019-07-29 DIAGNOSIS — D509 Iron deficiency anemia, unspecified: Secondary | ICD-10-CM | POA: Diagnosis not present

## 2019-07-29 DIAGNOSIS — N186 End stage renal disease: Secondary | ICD-10-CM | POA: Diagnosis not present

## 2019-07-31 DIAGNOSIS — N2581 Secondary hyperparathyroidism of renal origin: Secondary | ICD-10-CM | POA: Diagnosis not present

## 2019-07-31 DIAGNOSIS — Z992 Dependence on renal dialysis: Secondary | ICD-10-CM | POA: Diagnosis not present

## 2019-07-31 DIAGNOSIS — N186 End stage renal disease: Secondary | ICD-10-CM | POA: Diagnosis not present

## 2019-07-31 DIAGNOSIS — D509 Iron deficiency anemia, unspecified: Secondary | ICD-10-CM | POA: Diagnosis not present

## 2019-07-31 DIAGNOSIS — D631 Anemia in chronic kidney disease: Secondary | ICD-10-CM | POA: Diagnosis not present

## 2019-08-03 DIAGNOSIS — D509 Iron deficiency anemia, unspecified: Secondary | ICD-10-CM | POA: Diagnosis not present

## 2019-08-03 DIAGNOSIS — N186 End stage renal disease: Secondary | ICD-10-CM | POA: Diagnosis not present

## 2019-08-03 DIAGNOSIS — D631 Anemia in chronic kidney disease: Secondary | ICD-10-CM | POA: Diagnosis not present

## 2019-08-03 DIAGNOSIS — N2581 Secondary hyperparathyroidism of renal origin: Secondary | ICD-10-CM | POA: Diagnosis not present

## 2019-08-03 DIAGNOSIS — Z992 Dependence on renal dialysis: Secondary | ICD-10-CM | POA: Diagnosis not present

## 2019-08-04 DIAGNOSIS — Z992 Dependence on renal dialysis: Secondary | ICD-10-CM | POA: Diagnosis not present

## 2019-08-04 DIAGNOSIS — N186 End stage renal disease: Secondary | ICD-10-CM | POA: Diagnosis not present

## 2019-08-04 DIAGNOSIS — I129 Hypertensive chronic kidney disease with stage 1 through stage 4 chronic kidney disease, or unspecified chronic kidney disease: Secondary | ICD-10-CM | POA: Diagnosis not present

## 2019-08-05 DIAGNOSIS — N186 End stage renal disease: Secondary | ICD-10-CM | POA: Diagnosis not present

## 2019-08-05 DIAGNOSIS — Z23 Encounter for immunization: Secondary | ICD-10-CM | POA: Diagnosis not present

## 2019-08-05 DIAGNOSIS — D509 Iron deficiency anemia, unspecified: Secondary | ICD-10-CM | POA: Diagnosis not present

## 2019-08-05 DIAGNOSIS — D631 Anemia in chronic kidney disease: Secondary | ICD-10-CM | POA: Diagnosis not present

## 2019-08-05 DIAGNOSIS — N2581 Secondary hyperparathyroidism of renal origin: Secondary | ICD-10-CM | POA: Diagnosis not present

## 2019-08-05 DIAGNOSIS — Z992 Dependence on renal dialysis: Secondary | ICD-10-CM | POA: Diagnosis not present

## 2019-08-07 DIAGNOSIS — N2581 Secondary hyperparathyroidism of renal origin: Secondary | ICD-10-CM | POA: Diagnosis not present

## 2019-08-07 DIAGNOSIS — N186 End stage renal disease: Secondary | ICD-10-CM | POA: Diagnosis not present

## 2019-08-07 DIAGNOSIS — D631 Anemia in chronic kidney disease: Secondary | ICD-10-CM | POA: Diagnosis not present

## 2019-08-07 DIAGNOSIS — Z992 Dependence on renal dialysis: Secondary | ICD-10-CM | POA: Diagnosis not present

## 2019-08-07 DIAGNOSIS — D509 Iron deficiency anemia, unspecified: Secondary | ICD-10-CM | POA: Diagnosis not present

## 2019-08-07 DIAGNOSIS — Z23 Encounter for immunization: Secondary | ICD-10-CM | POA: Diagnosis not present

## 2019-08-10 DIAGNOSIS — Z992 Dependence on renal dialysis: Secondary | ICD-10-CM | POA: Diagnosis not present

## 2019-08-10 DIAGNOSIS — D509 Iron deficiency anemia, unspecified: Secondary | ICD-10-CM | POA: Diagnosis not present

## 2019-08-10 DIAGNOSIS — N186 End stage renal disease: Secondary | ICD-10-CM | POA: Diagnosis not present

## 2019-08-10 DIAGNOSIS — Z23 Encounter for immunization: Secondary | ICD-10-CM | POA: Diagnosis not present

## 2019-08-10 DIAGNOSIS — D631 Anemia in chronic kidney disease: Secondary | ICD-10-CM | POA: Diagnosis not present

## 2019-08-10 DIAGNOSIS — N2581 Secondary hyperparathyroidism of renal origin: Secondary | ICD-10-CM | POA: Diagnosis not present

## 2019-08-12 DIAGNOSIS — N186 End stage renal disease: Secondary | ICD-10-CM | POA: Diagnosis not present

## 2019-08-12 DIAGNOSIS — Z992 Dependence on renal dialysis: Secondary | ICD-10-CM | POA: Diagnosis not present

## 2019-08-12 DIAGNOSIS — N2581 Secondary hyperparathyroidism of renal origin: Secondary | ICD-10-CM | POA: Diagnosis not present

## 2019-08-12 DIAGNOSIS — Z23 Encounter for immunization: Secondary | ICD-10-CM | POA: Diagnosis not present

## 2019-08-12 DIAGNOSIS — D509 Iron deficiency anemia, unspecified: Secondary | ICD-10-CM | POA: Diagnosis not present

## 2019-08-12 DIAGNOSIS — D631 Anemia in chronic kidney disease: Secondary | ICD-10-CM | POA: Diagnosis not present

## 2019-08-13 ENCOUNTER — Ambulatory Visit (INDEPENDENT_AMBULATORY_CARE_PROVIDER_SITE_OTHER): Payer: Medicare Other | Admitting: Family

## 2019-08-13 ENCOUNTER — Encounter: Payer: Self-pay | Admitting: Family

## 2019-08-13 ENCOUNTER — Other Ambulatory Visit: Payer: Self-pay

## 2019-08-13 VITALS — BP 161/67 | HR 80 | Temp 97.1°F | Resp 16 | Ht 67.0 in | Wt 154.0 lb

## 2019-08-13 DIAGNOSIS — T82510D Breakdown (mechanical) of surgically created arteriovenous fistula, subsequent encounter: Secondary | ICD-10-CM

## 2019-08-13 DIAGNOSIS — T82510A Breakdown (mechanical) of surgically created arteriovenous fistula, initial encounter: Secondary | ICD-10-CM | POA: Diagnosis not present

## 2019-08-13 DIAGNOSIS — I770 Arteriovenous fistula, acquired: Secondary | ICD-10-CM | POA: Diagnosis not present

## 2019-08-13 DIAGNOSIS — N186 End stage renal disease: Secondary | ICD-10-CM

## 2019-08-13 DIAGNOSIS — Z992 Dependence on renal dialysis: Secondary | ICD-10-CM | POA: Diagnosis not present

## 2019-08-13 NOTE — Progress Notes (Signed)
CC: evaluation of aneurysmal left upper arm AVF, ESRD on hemodialysis  History of Present Illness  Lonnie [name] is a 85 y.o. (08-May-1925) male whom Dr. Oneida Alar evaluated on 02-20-18 for steal symptoms from his left arm AV fistula placed on 07-10-16 by Dr. Bridgett Larsson.   He previously saw Dr. Scot Dock in August 2018 for similar symptoms.  Dr. Bridgett Larsson subsequently did an arteriogram on him and suggested that the only treatment option would be ligation of the fistula and the patient decided he did not have significant enough symptoms.    At his 02-20-18 visit left upper extremity had a palpable thrill and audible bruit in fistula.  No palpable radial pulse.  No ulcerations on the fingertips.  No evidence of muscle atrophy left compared to right. Mild to moderate steal symptoms left hand.  Dr. Oneida Alar discussed with the patient ligation of the fistula and placing a new access for catheter.  However, pt stated his symptoms were tolerable and he did not want to go into starting a brand-new access over at that point. He will call us at any time if the pain becomes worse over time if he develops muscle atrophy or ulcerations on the fingertips or starts to lose sensation or grip strength in his hand.  He returns today at the request of Dr. Harrie Jeans for evaluation of his left upper arm AVF, indication on referral form is aneurysmal and shiny left upper arm AVF.  Pt states there have been no bleeding issues. He states there are no problems during dialysis. He states that the cold feeling is in left hand is no worse. There are no ulcers on his left hand or fingers.   He dialyzes MWF. He states this is his first and only HD permanent access. He is right hand dominant.   Past Medical History:  Diagnosis Date  . Adenomatous polyps   . Bursitis    left hip  . Chronic kidney disease    Stage 4  . Frequent urination at night   . GERD (gastroesophageal reflux disease)   . Hypertension   . Hypothyroidism   .  Pneumonia   . Polyclonal gammopathy determined by serum protein electrophoresis 10/04/2015    Social History Social History   Tobacco Use  . Smoking status: Former Smoker    Quit date: 01/25/1980    Years since quitting: 39.5  . Smokeless tobacco: Never Used  Substance Use Topics  . Alcohol use: No    Comment: former alcoholic 32 years ago was last drink  . Drug use: No    Family History Family History  Problem Relation Age of Onset  . Diabetes Father   . Diabetes Brother   . Heart disease Brother   . Colon cancer Neg Hx   . Rectal cancer Neg Hx     Surgical History Past Surgical History:  Procedure Laterality Date  . AORTIC ARCH ANGIOGRAPHY N/A 08/01/2017   Procedure: Aortic Arch Angiography;  Surgeon: Conrad Kossuth, MD;  Location: [facility];  Service: Cardiovascular;  Laterality: N/A;  . AV FISTULA PLACEMENT Left 07/10/2016   Procedure: LEFT BRACHIOCEPHALIC ARTERIOVENOUS (AV) FISTULA CREATION;  Surgeon: Conrad Birdsboro, MD;  Location: [facility];  Service: Vascular;  Laterality: Left;  . COLONOSCOPY    . RENAL BIOPSY Left 10/06/2015  . REVISON OF ARTERIOVENOUS FISTULA Left 35/36/1443   Procedure: PLICATION OF LEFT BRACHIOCEPHALIC ARTERIOVENOUS FISTULA;  Surgeon: Conrad , MD;  Location: [facility];  Service: Vascular;  Laterality: Left;  . SKIN SURGERY     back  . UPPER EXTREMITY ANGIOGRAPHY Left 08/01/2017   Procedure: Upper Extremity Angiography;  Surgeon: Conrad Slaton, MD;  Location: [facility];  Service: Cardiovascular;  Laterality: Left;    No Known Allergies  Current Outpatient Medications  Medication Sig Dispense Refill  . amLODipine (NORVASC) 5 MG tablet Take 5 mg by mouth at bedtime.     . calcium acetate (PHOSLO) 667 MG capsule Take 1 capsule by mouth 3 (three) times daily with meals.   0  . Cyanocobalamin (VITAMIN B-12 PO) Take 1 tablet by mouth daily.    . diphenhydramine-acetaminophen (TYLENOL PM) 25-500 MG TABS tablet Take 1 tablet by mouth at  bedtime as needed (sleep).    [name] escitalopram (LEXAPRO) 10 MG tablet Take 10 mg by mouth at bedtime.     [name] levothyroxine (SYNTHROID, LEVOTHROID) 88 MCG tablet Take 88 mcg by mouth at bedtime.    . lidocaine-prilocaine (EMLA) cream Apply 1 application topically daily as needed (port access).   0  . mirabegron ER (MYRBETRIQ) 50 MG TB24 tablet Take 50 mg by mouth at bedtime.     . multivitamin (RENA-VIT) TABS tablet Take 1 tablet by mouth daily.    . polyethylene glycol (MIRALAX) packet Take 17 g by mouth daily. 14 each 0  . polyvinyl alcohol (LIQUIFILM TEARS) 1.4 % ophthalmic solution Place 1-2 drops into both eyes as needed for dry eyes.    [name] amoxicillin-clavulanate (AUGMENTIN) 875-125 MG tablet Take 1 tablet by mouth every 12 (twelve) hours. (Patient not taking: Reported on 08/13/2019) 14 tablet 0  . ranitidine (ZANTAC) 150 MG capsule Take 150 mg by mouth 2 (two) times daily.     No current facility-administered medications for this visit.      REVIEW OF SYSTEMS: see HPI for pertinent positives and negatives    PHYSICAL EXAMINATION:  Vitals:   08/13/19 0907  BP: (!) 161/67  Pulse: 80  Resp: 16  Temp: (!) 97.1 F (36.2 C)  TempSrc: Temporal  SpO2: 95%  Weight: 154 lb (69.9 kg)  Height: 5\' 7"  (1.702 m)   Body mass index is 24.12 kg/m.  General: The patient appears younger than his stated age.   HEENT:  No gross abnormalities Pulmonary: Respirations are non-labored Abdomen: Soft and non-tender  Musculoskeletal: There are no major deformities.   Neurologic: No focal weakness or paresthesias are detected, Skin: There are no ulcer or rashes noted. Psychiatric: The patient has normal affect. Cardiovascular: There is a regular rate and rhythm without significant murmur appreciated.  Left radial pulse is 1+ palpable, right is 3+ palpable. Bilateral hand grips strength is 5/5.  Left upper arm AVF with brisk bruit and thrill, is aneurysmal, see photos below    Left upper arm AVF      Medical Decision Making  Lonnie [name] is a 85 y.o. male who is referred by Dr. Harrie Jeans for concern re aneurysmal left upper arm AVF.   Dr. Oneida Alar spoke with and examined pt.  The skin of the aneurysmal sections is not thin, is fairly thick when pinched. Pt states he has no issues with bleeding from the AVF, no issues during dialysis, his left hand steal symptoms are no worse.   Dr. Oneida Alar advised pt to notify his HD center personnel or Dr. Royce Macadamia if he feels the skin is becoming thin, or if he has bleeding issues, or any issues with his AVF that he feels needs  to be addressed.  Pt was instructed how to gently pinch the skin over the aneurysmal sections of his AVF.   Follow up with [facility] as needed.     Clemon Chambers, RN, MSN, FNP-C [facility] Office: [phone]  08/13/2019, 9:23 AM  Clinic MD: Oneida Alar

## 2019-08-14 DIAGNOSIS — N2581 Secondary hyperparathyroidism of renal origin: Secondary | ICD-10-CM | POA: Diagnosis not present

## 2019-08-14 DIAGNOSIS — D631 Anemia in chronic kidney disease: Secondary | ICD-10-CM | POA: Diagnosis not present

## 2019-08-14 DIAGNOSIS — N186 End stage renal disease: Secondary | ICD-10-CM | POA: Diagnosis not present

## 2019-08-14 DIAGNOSIS — D509 Iron deficiency anemia, unspecified: Secondary | ICD-10-CM | POA: Diagnosis not present

## 2019-08-14 DIAGNOSIS — Z992 Dependence on renal dialysis: Secondary | ICD-10-CM | POA: Diagnosis not present

## 2019-08-14 DIAGNOSIS — Z23 Encounter for immunization: Secondary | ICD-10-CM | POA: Diagnosis not present

## 2019-08-17 DIAGNOSIS — D631 Anemia in chronic kidney disease: Secondary | ICD-10-CM | POA: Diagnosis not present

## 2019-08-17 DIAGNOSIS — Z992 Dependence on renal dialysis: Secondary | ICD-10-CM | POA: Diagnosis not present

## 2019-08-17 DIAGNOSIS — N186 End stage renal disease: Secondary | ICD-10-CM | POA: Diagnosis not present

## 2019-08-17 DIAGNOSIS — N2581 Secondary hyperparathyroidism of renal origin: Secondary | ICD-10-CM | POA: Diagnosis not present

## 2019-08-17 DIAGNOSIS — Z23 Encounter for immunization: Secondary | ICD-10-CM | POA: Diagnosis not present

## 2019-08-17 DIAGNOSIS — D509 Iron deficiency anemia, unspecified: Secondary | ICD-10-CM | POA: Diagnosis not present

## 2019-08-19 DIAGNOSIS — D631 Anemia in chronic kidney disease: Secondary | ICD-10-CM | POA: Diagnosis not present

## 2019-08-19 DIAGNOSIS — N2581 Secondary hyperparathyroidism of renal origin: Secondary | ICD-10-CM | POA: Diagnosis not present

## 2019-08-19 DIAGNOSIS — N186 End stage renal disease: Secondary | ICD-10-CM | POA: Diagnosis not present

## 2019-08-19 DIAGNOSIS — Z23 Encounter for immunization: Secondary | ICD-10-CM | POA: Diagnosis not present

## 2019-08-19 DIAGNOSIS — Z992 Dependence on renal dialysis: Secondary | ICD-10-CM | POA: Diagnosis not present

## 2019-08-19 DIAGNOSIS — D509 Iron deficiency anemia, unspecified: Secondary | ICD-10-CM | POA: Diagnosis not present

## 2019-08-21 DIAGNOSIS — Z23 Encounter for immunization: Secondary | ICD-10-CM | POA: Diagnosis not present

## 2019-08-21 DIAGNOSIS — D631 Anemia in chronic kidney disease: Secondary | ICD-10-CM | POA: Diagnosis not present

## 2019-08-21 DIAGNOSIS — Z992 Dependence on renal dialysis: Secondary | ICD-10-CM | POA: Diagnosis not present

## 2019-08-21 DIAGNOSIS — N2581 Secondary hyperparathyroidism of renal origin: Secondary | ICD-10-CM | POA: Diagnosis not present

## 2019-08-21 DIAGNOSIS — D509 Iron deficiency anemia, unspecified: Secondary | ICD-10-CM | POA: Diagnosis not present

## 2019-08-21 DIAGNOSIS — N186 End stage renal disease: Secondary | ICD-10-CM | POA: Diagnosis not present

## 2019-08-24 DIAGNOSIS — Z23 Encounter for immunization: Secondary | ICD-10-CM | POA: Diagnosis not present

## 2019-08-24 DIAGNOSIS — N186 End stage renal disease: Secondary | ICD-10-CM | POA: Diagnosis not present

## 2019-08-24 DIAGNOSIS — N2581 Secondary hyperparathyroidism of renal origin: Secondary | ICD-10-CM | POA: Diagnosis not present

## 2019-08-24 DIAGNOSIS — D509 Iron deficiency anemia, unspecified: Secondary | ICD-10-CM | POA: Diagnosis not present

## 2019-08-24 DIAGNOSIS — Z992 Dependence on renal dialysis: Secondary | ICD-10-CM | POA: Diagnosis not present

## 2019-08-24 DIAGNOSIS — D631 Anemia in chronic kidney disease: Secondary | ICD-10-CM | POA: Diagnosis not present

## 2019-08-26 DIAGNOSIS — D631 Anemia in chronic kidney disease: Secondary | ICD-10-CM | POA: Diagnosis not present

## 2019-08-26 DIAGNOSIS — Z992 Dependence on renal dialysis: Secondary | ICD-10-CM | POA: Diagnosis not present

## 2019-08-26 DIAGNOSIS — D509 Iron deficiency anemia, unspecified: Secondary | ICD-10-CM | POA: Diagnosis not present

## 2019-08-26 DIAGNOSIS — Z23 Encounter for immunization: Secondary | ICD-10-CM | POA: Diagnosis not present

## 2019-08-26 DIAGNOSIS — N2581 Secondary hyperparathyroidism of renal origin: Secondary | ICD-10-CM | POA: Diagnosis not present

## 2019-08-26 DIAGNOSIS — N186 End stage renal disease: Secondary | ICD-10-CM | POA: Diagnosis not present

## 2019-08-28 DIAGNOSIS — D509 Iron deficiency anemia, unspecified: Secondary | ICD-10-CM | POA: Diagnosis not present

## 2019-08-28 DIAGNOSIS — Z23 Encounter for immunization: Secondary | ICD-10-CM | POA: Diagnosis not present

## 2019-08-28 DIAGNOSIS — Z992 Dependence on renal dialysis: Secondary | ICD-10-CM | POA: Diagnosis not present

## 2019-08-28 DIAGNOSIS — N186 End stage renal disease: Secondary | ICD-10-CM | POA: Diagnosis not present

## 2019-08-28 DIAGNOSIS — D631 Anemia in chronic kidney disease: Secondary | ICD-10-CM | POA: Diagnosis not present

## 2019-08-28 DIAGNOSIS — N2581 Secondary hyperparathyroidism of renal origin: Secondary | ICD-10-CM | POA: Diagnosis not present

## 2019-08-31 DIAGNOSIS — Z992 Dependence on renal dialysis: Secondary | ICD-10-CM | POA: Diagnosis not present

## 2019-08-31 DIAGNOSIS — N186 End stage renal disease: Secondary | ICD-10-CM | POA: Diagnosis not present

## 2019-08-31 DIAGNOSIS — D509 Iron deficiency anemia, unspecified: Secondary | ICD-10-CM | POA: Diagnosis not present

## 2019-08-31 DIAGNOSIS — D631 Anemia in chronic kidney disease: Secondary | ICD-10-CM | POA: Diagnosis not present

## 2019-08-31 DIAGNOSIS — Z23 Encounter for immunization: Secondary | ICD-10-CM | POA: Diagnosis not present

## 2019-08-31 DIAGNOSIS — N2581 Secondary hyperparathyroidism of renal origin: Secondary | ICD-10-CM | POA: Diagnosis not present

## 2019-09-02 DIAGNOSIS — Z992 Dependence on renal dialysis: Secondary | ICD-10-CM | POA: Diagnosis not present

## 2019-09-02 DIAGNOSIS — D631 Anemia in chronic kidney disease: Secondary | ICD-10-CM | POA: Diagnosis not present

## 2019-09-02 DIAGNOSIS — N186 End stage renal disease: Secondary | ICD-10-CM | POA: Diagnosis not present

## 2019-09-02 DIAGNOSIS — N2581 Secondary hyperparathyroidism of renal origin: Secondary | ICD-10-CM | POA: Diagnosis not present

## 2019-09-02 DIAGNOSIS — D509 Iron deficiency anemia, unspecified: Secondary | ICD-10-CM | POA: Diagnosis not present

## 2019-09-02 DIAGNOSIS — Z23 Encounter for immunization: Secondary | ICD-10-CM | POA: Diagnosis not present

## 2019-09-03 DIAGNOSIS — Z992 Dependence on renal dialysis: Secondary | ICD-10-CM | POA: Diagnosis not present

## 2019-09-03 DIAGNOSIS — N186 End stage renal disease: Secondary | ICD-10-CM | POA: Diagnosis not present

## 2019-09-03 DIAGNOSIS — I129 Hypertensive chronic kidney disease with stage 1 through stage 4 chronic kidney disease, or unspecified chronic kidney disease: Secondary | ICD-10-CM | POA: Diagnosis not present

## 2019-09-04 DIAGNOSIS — D631 Anemia in chronic kidney disease: Secondary | ICD-10-CM | POA: Diagnosis not present

## 2019-09-04 DIAGNOSIS — N2581 Secondary hyperparathyroidism of renal origin: Secondary | ICD-10-CM | POA: Diagnosis not present

## 2019-09-04 DIAGNOSIS — D509 Iron deficiency anemia, unspecified: Secondary | ICD-10-CM | POA: Diagnosis not present

## 2019-09-04 DIAGNOSIS — Z992 Dependence on renal dialysis: Secondary | ICD-10-CM | POA: Diagnosis not present

## 2019-09-04 DIAGNOSIS — N186 End stage renal disease: Secondary | ICD-10-CM | POA: Diagnosis not present

## 2019-09-07 DIAGNOSIS — N186 End stage renal disease: Secondary | ICD-10-CM | POA: Diagnosis not present

## 2019-09-07 DIAGNOSIS — Z992 Dependence on renal dialysis: Secondary | ICD-10-CM | POA: Diagnosis not present

## 2019-09-07 DIAGNOSIS — D631 Anemia in chronic kidney disease: Secondary | ICD-10-CM | POA: Diagnosis not present

## 2019-09-07 DIAGNOSIS — D509 Iron deficiency anemia, unspecified: Secondary | ICD-10-CM | POA: Diagnosis not present

## 2019-09-07 DIAGNOSIS — N2581 Secondary hyperparathyroidism of renal origin: Secondary | ICD-10-CM | POA: Diagnosis not present

## 2019-09-09 DIAGNOSIS — Z992 Dependence on renal dialysis: Secondary | ICD-10-CM | POA: Diagnosis not present

## 2019-09-09 DIAGNOSIS — D631 Anemia in chronic kidney disease: Secondary | ICD-10-CM | POA: Diagnosis not present

## 2019-09-09 DIAGNOSIS — D509 Iron deficiency anemia, unspecified: Secondary | ICD-10-CM | POA: Diagnosis not present

## 2019-09-09 DIAGNOSIS — N186 End stage renal disease: Secondary | ICD-10-CM | POA: Diagnosis not present

## 2019-09-09 DIAGNOSIS — N2581 Secondary hyperparathyroidism of renal origin: Secondary | ICD-10-CM | POA: Diagnosis not present

## 2019-09-11 DIAGNOSIS — N186 End stage renal disease: Secondary | ICD-10-CM | POA: Diagnosis not present

## 2019-09-11 DIAGNOSIS — N2581 Secondary hyperparathyroidism of renal origin: Secondary | ICD-10-CM | POA: Diagnosis not present

## 2019-09-11 DIAGNOSIS — D509 Iron deficiency anemia, unspecified: Secondary | ICD-10-CM | POA: Diagnosis not present

## 2019-09-11 DIAGNOSIS — Z992 Dependence on renal dialysis: Secondary | ICD-10-CM | POA: Diagnosis not present

## 2019-09-11 DIAGNOSIS — D631 Anemia in chronic kidney disease: Secondary | ICD-10-CM | POA: Diagnosis not present

## 2019-09-14 DIAGNOSIS — N2581 Secondary hyperparathyroidism of renal origin: Secondary | ICD-10-CM | POA: Diagnosis not present

## 2019-09-14 DIAGNOSIS — D509 Iron deficiency anemia, unspecified: Secondary | ICD-10-CM | POA: Diagnosis not present

## 2019-09-14 DIAGNOSIS — D631 Anemia in chronic kidney disease: Secondary | ICD-10-CM | POA: Diagnosis not present

## 2019-09-14 DIAGNOSIS — N186 End stage renal disease: Secondary | ICD-10-CM | POA: Diagnosis not present

## 2019-09-14 DIAGNOSIS — Z992 Dependence on renal dialysis: Secondary | ICD-10-CM | POA: Diagnosis not present

## 2019-09-16 DIAGNOSIS — N2581 Secondary hyperparathyroidism of renal origin: Secondary | ICD-10-CM | POA: Diagnosis not present

## 2019-09-16 DIAGNOSIS — D631 Anemia in chronic kidney disease: Secondary | ICD-10-CM | POA: Diagnosis not present

## 2019-09-16 DIAGNOSIS — D509 Iron deficiency anemia, unspecified: Secondary | ICD-10-CM | POA: Diagnosis not present

## 2019-09-16 DIAGNOSIS — Z992 Dependence on renal dialysis: Secondary | ICD-10-CM | POA: Diagnosis not present

## 2019-09-16 DIAGNOSIS — N186 End stage renal disease: Secondary | ICD-10-CM | POA: Diagnosis not present

## 2019-09-18 DIAGNOSIS — N2581 Secondary hyperparathyroidism of renal origin: Secondary | ICD-10-CM | POA: Diagnosis not present

## 2019-09-18 DIAGNOSIS — D509 Iron deficiency anemia, unspecified: Secondary | ICD-10-CM | POA: Diagnosis not present

## 2019-09-18 DIAGNOSIS — Z992 Dependence on renal dialysis: Secondary | ICD-10-CM | POA: Diagnosis not present

## 2019-09-18 DIAGNOSIS — N186 End stage renal disease: Secondary | ICD-10-CM | POA: Diagnosis not present

## 2019-09-18 DIAGNOSIS — D631 Anemia in chronic kidney disease: Secondary | ICD-10-CM | POA: Diagnosis not present

## 2019-09-21 DIAGNOSIS — Z992 Dependence on renal dialysis: Secondary | ICD-10-CM | POA: Diagnosis not present

## 2019-09-21 DIAGNOSIS — D631 Anemia in chronic kidney disease: Secondary | ICD-10-CM | POA: Diagnosis not present

## 2019-09-21 DIAGNOSIS — D509 Iron deficiency anemia, unspecified: Secondary | ICD-10-CM | POA: Diagnosis not present

## 2019-09-21 DIAGNOSIS — N186 End stage renal disease: Secondary | ICD-10-CM | POA: Diagnosis not present

## 2019-09-21 DIAGNOSIS — N2581 Secondary hyperparathyroidism of renal origin: Secondary | ICD-10-CM | POA: Diagnosis not present

## 2019-09-23 DIAGNOSIS — D631 Anemia in chronic kidney disease: Secondary | ICD-10-CM | POA: Diagnosis not present

## 2019-09-23 DIAGNOSIS — D509 Iron deficiency anemia, unspecified: Secondary | ICD-10-CM | POA: Diagnosis not present

## 2019-09-23 DIAGNOSIS — N186 End stage renal disease: Secondary | ICD-10-CM | POA: Diagnosis not present

## 2019-09-23 DIAGNOSIS — Z992 Dependence on renal dialysis: Secondary | ICD-10-CM | POA: Diagnosis not present

## 2019-09-23 DIAGNOSIS — N2581 Secondary hyperparathyroidism of renal origin: Secondary | ICD-10-CM | POA: Diagnosis not present

## 2019-09-25 DIAGNOSIS — N186 End stage renal disease: Secondary | ICD-10-CM | POA: Diagnosis not present

## 2019-09-25 DIAGNOSIS — D631 Anemia in chronic kidney disease: Secondary | ICD-10-CM | POA: Diagnosis not present

## 2019-09-25 DIAGNOSIS — Z992 Dependence on renal dialysis: Secondary | ICD-10-CM | POA: Diagnosis not present

## 2019-09-25 DIAGNOSIS — D509 Iron deficiency anemia, unspecified: Secondary | ICD-10-CM | POA: Diagnosis not present

## 2019-09-25 DIAGNOSIS — N2581 Secondary hyperparathyroidism of renal origin: Secondary | ICD-10-CM | POA: Diagnosis not present

## 2019-09-28 DIAGNOSIS — N2581 Secondary hyperparathyroidism of renal origin: Secondary | ICD-10-CM | POA: Diagnosis not present

## 2019-09-28 DIAGNOSIS — Z992 Dependence on renal dialysis: Secondary | ICD-10-CM | POA: Diagnosis not present

## 2019-09-28 DIAGNOSIS — N186 End stage renal disease: Secondary | ICD-10-CM | POA: Diagnosis not present

## 2019-09-28 DIAGNOSIS — D509 Iron deficiency anemia, unspecified: Secondary | ICD-10-CM | POA: Diagnosis not present

## 2019-09-28 DIAGNOSIS — D631 Anemia in chronic kidney disease: Secondary | ICD-10-CM | POA: Diagnosis not present

## 2019-09-30 DIAGNOSIS — N2581 Secondary hyperparathyroidism of renal origin: Secondary | ICD-10-CM | POA: Diagnosis not present

## 2019-09-30 DIAGNOSIS — D509 Iron deficiency anemia, unspecified: Secondary | ICD-10-CM | POA: Diagnosis not present

## 2019-09-30 DIAGNOSIS — D631 Anemia in chronic kidney disease: Secondary | ICD-10-CM | POA: Diagnosis not present

## 2019-09-30 DIAGNOSIS — N186 End stage renal disease: Secondary | ICD-10-CM | POA: Diagnosis not present

## 2019-09-30 DIAGNOSIS — Z992 Dependence on renal dialysis: Secondary | ICD-10-CM | POA: Diagnosis not present

## 2019-10-02 DIAGNOSIS — D631 Anemia in chronic kidney disease: Secondary | ICD-10-CM | POA: Diagnosis not present

## 2019-10-02 DIAGNOSIS — Z992 Dependence on renal dialysis: Secondary | ICD-10-CM | POA: Diagnosis not present

## 2019-10-02 DIAGNOSIS — N186 End stage renal disease: Secondary | ICD-10-CM | POA: Diagnosis not present

## 2019-10-02 DIAGNOSIS — D509 Iron deficiency anemia, unspecified: Secondary | ICD-10-CM | POA: Diagnosis not present

## 2019-10-02 DIAGNOSIS — N2581 Secondary hyperparathyroidism of renal origin: Secondary | ICD-10-CM | POA: Diagnosis not present

## 2019-10-04 DIAGNOSIS — I129 Hypertensive chronic kidney disease with stage 1 through stage 4 chronic kidney disease, or unspecified chronic kidney disease: Secondary | ICD-10-CM | POA: Diagnosis not present

## 2019-10-04 DIAGNOSIS — Z992 Dependence on renal dialysis: Secondary | ICD-10-CM | POA: Diagnosis not present

## 2019-10-04 DIAGNOSIS — N186 End stage renal disease: Secondary | ICD-10-CM | POA: Diagnosis not present

## 2019-10-05 DIAGNOSIS — D509 Iron deficiency anemia, unspecified: Secondary | ICD-10-CM | POA: Diagnosis not present

## 2019-10-05 DIAGNOSIS — N186 End stage renal disease: Secondary | ICD-10-CM | POA: Diagnosis not present

## 2019-10-05 DIAGNOSIS — Z992 Dependence on renal dialysis: Secondary | ICD-10-CM | POA: Diagnosis not present

## 2019-10-05 DIAGNOSIS — D631 Anemia in chronic kidney disease: Secondary | ICD-10-CM | POA: Diagnosis not present

## 2019-10-05 DIAGNOSIS — N2581 Secondary hyperparathyroidism of renal origin: Secondary | ICD-10-CM | POA: Diagnosis not present

## 2019-10-06 DIAGNOSIS — N186 End stage renal disease: Secondary | ICD-10-CM | POA: Diagnosis not present

## 2019-10-06 DIAGNOSIS — R209 Unspecified disturbances of skin sensation: Secondary | ICD-10-CM | POA: Diagnosis not present

## 2019-10-06 DIAGNOSIS — I120 Hypertensive chronic kidney disease with stage 5 chronic kidney disease or end stage renal disease: Secondary | ICD-10-CM | POA: Diagnosis not present

## 2019-10-06 DIAGNOSIS — M25571 Pain in right ankle and joints of right foot: Secondary | ICD-10-CM | POA: Diagnosis not present

## 2019-10-07 DIAGNOSIS — D509 Iron deficiency anemia, unspecified: Secondary | ICD-10-CM | POA: Diagnosis not present

## 2019-10-07 DIAGNOSIS — N186 End stage renal disease: Secondary | ICD-10-CM | POA: Diagnosis not present

## 2019-10-07 DIAGNOSIS — D631 Anemia in chronic kidney disease: Secondary | ICD-10-CM | POA: Diagnosis not present

## 2019-10-07 DIAGNOSIS — Z992 Dependence on renal dialysis: Secondary | ICD-10-CM | POA: Diagnosis not present

## 2019-10-07 DIAGNOSIS — N2581 Secondary hyperparathyroidism of renal origin: Secondary | ICD-10-CM | POA: Diagnosis not present

## 2019-10-09 DIAGNOSIS — D509 Iron deficiency anemia, unspecified: Secondary | ICD-10-CM | POA: Diagnosis not present

## 2019-10-09 DIAGNOSIS — N186 End stage renal disease: Secondary | ICD-10-CM | POA: Diagnosis not present

## 2019-10-09 DIAGNOSIS — Z992 Dependence on renal dialysis: Secondary | ICD-10-CM | POA: Diagnosis not present

## 2019-10-09 DIAGNOSIS — D631 Anemia in chronic kidney disease: Secondary | ICD-10-CM | POA: Diagnosis not present

## 2019-10-09 DIAGNOSIS — N2581 Secondary hyperparathyroidism of renal origin: Secondary | ICD-10-CM | POA: Diagnosis not present

## 2019-10-12 ENCOUNTER — Telehealth (HOSPITAL_COMMUNITY): Payer: Self-pay | Admitting: *Deleted

## 2019-10-12 DIAGNOSIS — N2581 Secondary hyperparathyroidism of renal origin: Secondary | ICD-10-CM | POA: Diagnosis not present

## 2019-10-12 DIAGNOSIS — D509 Iron deficiency anemia, unspecified: Secondary | ICD-10-CM | POA: Diagnosis not present

## 2019-10-12 DIAGNOSIS — N186 End stage renal disease: Secondary | ICD-10-CM | POA: Diagnosis not present

## 2019-10-12 DIAGNOSIS — Z992 Dependence on renal dialysis: Secondary | ICD-10-CM | POA: Diagnosis not present

## 2019-10-12 DIAGNOSIS — D631 Anemia in chronic kidney disease: Secondary | ICD-10-CM | POA: Diagnosis not present

## 2019-10-12 NOTE — Telephone Encounter (Signed)
Attempted to reach pt to schedule.

## 2019-10-14 ENCOUNTER — Telehealth (HOSPITAL_COMMUNITY): Payer: Self-pay

## 2019-10-14 ENCOUNTER — Other Ambulatory Visit: Payer: Self-pay | Admitting: Internal Medicine

## 2019-10-14 DIAGNOSIS — R202 Paresthesia of skin: Secondary | ICD-10-CM

## 2019-10-14 DIAGNOSIS — D509 Iron deficiency anemia, unspecified: Secondary | ICD-10-CM | POA: Diagnosis not present

## 2019-10-14 DIAGNOSIS — N186 End stage renal disease: Secondary | ICD-10-CM | POA: Diagnosis not present

## 2019-10-14 DIAGNOSIS — Z992 Dependence on renal dialysis: Secondary | ICD-10-CM | POA: Diagnosis not present

## 2019-10-14 DIAGNOSIS — D631 Anemia in chronic kidney disease: Secondary | ICD-10-CM | POA: Diagnosis not present

## 2019-10-14 DIAGNOSIS — N2581 Secondary hyperparathyroidism of renal origin: Secondary | ICD-10-CM | POA: Diagnosis not present

## 2019-10-14 NOTE — Telephone Encounter (Signed)

## 2019-10-15 ENCOUNTER — Ambulatory Visit (INDEPENDENT_AMBULATORY_CARE_PROVIDER_SITE_OTHER)
Admission: RE | Admit: 2019-10-15 | Discharge: 2019-10-15 | Disposition: A | Discharge status: Home / Self Care | Payer: Medicare Other | Source: Ambulatory Visit | Attending: Family | Admitting: Family

## 2019-10-15 ENCOUNTER — Other Ambulatory Visit (HOSPITAL_COMMUNITY): Payer: Self-pay | Admitting: Internal Medicine

## 2019-10-15 ENCOUNTER — Other Ambulatory Visit: Payer: Self-pay

## 2019-10-15 ENCOUNTER — Ambulatory Visit (HOSPITAL_COMMUNITY)
Admission: RE | Admit: 2019-10-15 | Discharge: 2019-10-15 | Disposition: A | Discharge status: Home / Self Care | Payer: Medicare Other | Source: Ambulatory Visit | Attending: Family | Admitting: Family

## 2019-10-15 DIAGNOSIS — R209 Unspecified disturbances of skin sensation: Secondary | ICD-10-CM | POA: Insufficient documentation

## 2019-10-16 DIAGNOSIS — N186 End stage renal disease: Secondary | ICD-10-CM | POA: Diagnosis not present

## 2019-10-16 DIAGNOSIS — N2581 Secondary hyperparathyroidism of renal origin: Secondary | ICD-10-CM | POA: Diagnosis not present

## 2019-10-16 DIAGNOSIS — D509 Iron deficiency anemia, unspecified: Secondary | ICD-10-CM | POA: Diagnosis not present

## 2019-10-16 DIAGNOSIS — D631 Anemia in chronic kidney disease: Secondary | ICD-10-CM | POA: Diagnosis not present

## 2019-10-16 DIAGNOSIS — Z992 Dependence on renal dialysis: Secondary | ICD-10-CM | POA: Diagnosis not present

## 2019-10-19 DIAGNOSIS — N2581 Secondary hyperparathyroidism of renal origin: Secondary | ICD-10-CM | POA: Diagnosis not present

## 2019-10-19 DIAGNOSIS — D631 Anemia in chronic kidney disease: Secondary | ICD-10-CM | POA: Diagnosis not present

## 2019-10-19 DIAGNOSIS — Z992 Dependence on renal dialysis: Secondary | ICD-10-CM | POA: Diagnosis not present

## 2019-10-19 DIAGNOSIS — N186 End stage renal disease: Secondary | ICD-10-CM | POA: Diagnosis not present

## 2019-10-19 DIAGNOSIS — D509 Iron deficiency anemia, unspecified: Secondary | ICD-10-CM | POA: Diagnosis not present

## 2019-10-21 DIAGNOSIS — N186 End stage renal disease: Secondary | ICD-10-CM | POA: Diagnosis not present

## 2019-10-21 DIAGNOSIS — N2581 Secondary hyperparathyroidism of renal origin: Secondary | ICD-10-CM | POA: Diagnosis not present

## 2019-10-21 DIAGNOSIS — Z992 Dependence on renal dialysis: Secondary | ICD-10-CM | POA: Diagnosis not present

## 2019-10-21 DIAGNOSIS — D509 Iron deficiency anemia, unspecified: Secondary | ICD-10-CM | POA: Diagnosis not present

## 2019-10-21 DIAGNOSIS — D631 Anemia in chronic kidney disease: Secondary | ICD-10-CM | POA: Diagnosis not present

## 2019-10-23 DIAGNOSIS — N186 End stage renal disease: Secondary | ICD-10-CM | POA: Diagnosis not present

## 2019-10-23 DIAGNOSIS — N2581 Secondary hyperparathyroidism of renal origin: Secondary | ICD-10-CM | POA: Diagnosis not present

## 2019-10-23 DIAGNOSIS — D631 Anemia in chronic kidney disease: Secondary | ICD-10-CM | POA: Diagnosis not present

## 2019-10-23 DIAGNOSIS — Z992 Dependence on renal dialysis: Secondary | ICD-10-CM | POA: Diagnosis not present

## 2019-10-23 DIAGNOSIS — D509 Iron deficiency anemia, unspecified: Secondary | ICD-10-CM | POA: Diagnosis not present

## 2019-10-25 DIAGNOSIS — N2581 Secondary hyperparathyroidism of renal origin: Secondary | ICD-10-CM | POA: Diagnosis not present

## 2019-10-25 DIAGNOSIS — N186 End stage renal disease: Secondary | ICD-10-CM | POA: Diagnosis not present

## 2019-10-25 DIAGNOSIS — D631 Anemia in chronic kidney disease: Secondary | ICD-10-CM | POA: Diagnosis not present

## 2019-10-25 DIAGNOSIS — Z992 Dependence on renal dialysis: Secondary | ICD-10-CM | POA: Diagnosis not present

## 2019-10-25 DIAGNOSIS — D509 Iron deficiency anemia, unspecified: Secondary | ICD-10-CM | POA: Diagnosis not present

## 2019-10-27 DIAGNOSIS — N2581 Secondary hyperparathyroidism of renal origin: Secondary | ICD-10-CM | POA: Diagnosis not present

## 2019-10-27 DIAGNOSIS — N186 End stage renal disease: Secondary | ICD-10-CM | POA: Diagnosis not present

## 2019-10-27 DIAGNOSIS — D509 Iron deficiency anemia, unspecified: Secondary | ICD-10-CM | POA: Diagnosis not present

## 2019-10-27 DIAGNOSIS — Z992 Dependence on renal dialysis: Secondary | ICD-10-CM | POA: Diagnosis not present

## 2019-10-27 DIAGNOSIS — D631 Anemia in chronic kidney disease: Secondary | ICD-10-CM | POA: Diagnosis not present

## 2019-10-30 DIAGNOSIS — Z992 Dependence on renal dialysis: Secondary | ICD-10-CM | POA: Diagnosis not present

## 2019-10-30 DIAGNOSIS — N2581 Secondary hyperparathyroidism of renal origin: Secondary | ICD-10-CM | POA: Diagnosis not present

## 2019-10-30 DIAGNOSIS — N186 End stage renal disease: Secondary | ICD-10-CM | POA: Diagnosis not present

## 2019-10-30 DIAGNOSIS — D509 Iron deficiency anemia, unspecified: Secondary | ICD-10-CM | POA: Diagnosis not present

## 2019-10-30 DIAGNOSIS — D631 Anemia in chronic kidney disease: Secondary | ICD-10-CM | POA: Diagnosis not present

## 2019-11-02 DIAGNOSIS — N186 End stage renal disease: Secondary | ICD-10-CM | POA: Diagnosis not present

## 2019-11-02 DIAGNOSIS — D631 Anemia in chronic kidney disease: Secondary | ICD-10-CM | POA: Diagnosis not present

## 2019-11-02 DIAGNOSIS — D509 Iron deficiency anemia, unspecified: Secondary | ICD-10-CM | POA: Diagnosis not present

## 2019-11-02 DIAGNOSIS — N2581 Secondary hyperparathyroidism of renal origin: Secondary | ICD-10-CM | POA: Diagnosis not present

## 2019-11-02 DIAGNOSIS — Z992 Dependence on renal dialysis: Secondary | ICD-10-CM | POA: Diagnosis not present

## 2019-11-26 ENCOUNTER — Other Ambulatory Visit: Payer: Self-pay

## 2019-11-26 ENCOUNTER — Ambulatory Visit (INDEPENDENT_AMBULATORY_CARE_PROVIDER_SITE_OTHER): Payer: Medicare Other | Admitting: Physician Assistant

## 2019-11-26 ENCOUNTER — Encounter: Payer: Self-pay | Admitting: Physician Assistant

## 2019-11-26 VITALS — BP 140/67 | HR 68 | Temp 97.1°F | Resp 16 | Ht 67.0 in | Wt 154.0 lb

## 2019-11-26 DIAGNOSIS — N186 End stage renal disease: Secondary | ICD-10-CM

## 2019-11-26 DIAGNOSIS — L97311 Non-pressure chronic ulcer of right ankle limited to breakdown of skin: Secondary | ICD-10-CM

## 2019-11-26 DIAGNOSIS — Z992 Dependence on renal dialysis: Secondary | ICD-10-CM

## 2019-11-26 NOTE — Progress Notes (Signed)
Office Note     CC:  follow up Requesting Provider:  Prince Solian, MD  HPI: Lonnie [name] is a 85 y.o. (12/17/1924) male who presents for evaluation of right ankle pain.  Past medical history significant for hypertension, and end-stage renal disease on hemodialysis via left arm AV fistula.  Patient states that he has had burning pain in his lateral right ankle and foot at night over the past 3 to 4 months.  He states that it this is really starting to affect his sleep.  It should also be mentioned that patient sleeps exclusively on his right side.  He had arterial studies last month which demonstrated an occluded posterior tibial artery however no other hemodynamically significant stenosis.  Patient denies any wounds on his toes or claudication.  He is a former smoker.  He denies any diagnosis of diabetes.  The pt is on amlodipine for hypertension.  He is not taking any antiplatelet.   Past Medical History:  Diagnosis Date  . Adenomatous polyps   . Bursitis    left hip  . Chronic kidney disease    Stage 4  . Frequent urination at night   . GERD (gastroesophageal reflux disease)   . Hypertension   . Hypothyroidism   . Pneumonia   . Polyclonal gammopathy determined by serum protein electrophoresis 10/04/2015    Past Surgical History:  Procedure Laterality Date  . AORTIC ARCH ANGIOGRAPHY N/A 08/01/2017   Procedure: Aortic Arch Angiography;  Surgeon: Conrad Pajaros, MD;  Location: [facility];  Service: Cardiovascular;  Laterality: N/A;  . AV FISTULA PLACEMENT Left 07/10/2016   Procedure: LEFT BRACHIOCEPHALIC ARTERIOVENOUS (AV) FISTULA CREATION;  Surgeon: Conrad Paden, MD;  Location: [facility];  Service: Vascular;  Laterality: Left;  . COLONOSCOPY    . RENAL BIOPSY Left 10/06/2015  . REVISON OF ARTERIOVENOUS FISTULA Left 09/29/2535   Procedure: PLICATION OF LEFT BRACHIOCEPHALIC ARTERIOVENOUS FISTULA;  Surgeon: Conrad Mount Ayr, MD;  Location: [facility];  Service: Vascular;  Laterality:  Left;  . SKIN SURGERY     back  . UPPER EXTREMITY ANGIOGRAPHY Left 08/01/2017   Procedure: Upper Extremity Angiography;  Surgeon: Conrad [place], MD;  Location: [facility];  Service: Cardiovascular;  Laterality: Left;    Social History   Socioeconomic History  . Marital status: Widowed    Spouse name: Not on file  . Number of children: Not on file  . Years of education: Not on file  . Highest education level: Not on file  Occupational History  . Not on file  Tobacco Use  . Smoking status: Former Smoker    Quit date: 01/25/1980    Years since quitting: 39.8  . Smokeless tobacco: Never Used  Substance and Sexual Activity  . Alcohol use: No    Comment: former alcoholic 32 years ago was last drink  . Drug use: No  . Sexual activity: Not on file  Other Topics Concern  . Not on file  Social History Narrative  . Not on file   Social Determinants of Health   Financial Resource Strain:   . Difficulty of Paying Living Expenses: Not on file  Food Insecurity:   . Worried About Charity fundraiser in the Last Year: Not on file  . Ran Out of Food in the Last Year: Not on file  Transportation Needs:   . Lack of Transportation (Medical): Not on file  . Lack of Transportation (Non-Medical): Not on file  Physical  Activity:   . Days of Exercise per Week: Not on file  . Minutes of Exercise per Session: Not on file  Stress:   . Feeling of Stress : Not on file  Social Connections:   . Frequency of Communication with Friends and Family: Not on file  . Frequency of Social Gatherings with Friends and Family: Not on file  . Attends Religious Services: Not on file  . Active Member of Clubs or Organizations: Not on file  . Attends Archivist Meetings: Not on file  . Marital Status: Not on file  Intimate Partner Violence:   . Fear of Current or Ex-Partner: Not on file  . Emotionally Abused: Not on file  . Physically Abused: Not on file  . Sexually Abused: Not on file     Family History  Problem Relation Age of Onset  . Diabetes Father   . Diabetes Brother   . Heart disease Brother   . Colon cancer Neg Hx   . Rectal cancer Neg Hx     Current Outpatient Medications  Medication Sig Dispense Refill  . amLODipine (NORVASC) 5 MG tablet Take 5 mg by mouth at bedtime.     [name] amoxicillin-clavulanate (AUGMENTIN) 875-125 MG tablet Take 1 tablet by mouth every 12 (twelve) hours. 14 tablet 0  . calcium acetate (PHOSLO) 667 MG capsule Take 1 capsule by mouth 3 (three) times daily with meals.   0  . Cyanocobalamin (VITAMIN B-12 PO) Take 1 tablet by mouth daily.    . diphenhydramine-acetaminophen (TYLENOL PM) 25-500 MG TABS tablet Take 1 tablet by mouth at bedtime as needed (sleep).    [name] escitalopram (LEXAPRO) 10 MG tablet Take 10 mg by mouth at bedtime.     [name] levothyroxine (SYNTHROID, LEVOTHROID) 88 MCG tablet Take 88 mcg by mouth at bedtime.    . lidocaine-prilocaine (EMLA) cream Apply 1 application topically daily as needed (port access).   0  . mirabegron ER (MYRBETRIQ) 50 MG TB24 tablet Take 50 mg by mouth at bedtime.     . multivitamin (RENA-VIT) TABS tablet Take 1 tablet by mouth daily.    . polyethylene glycol (MIRALAX) packet Take 17 g by mouth daily. 14 each 0  . polyvinyl alcohol (LIQUIFILM TEARS) 1.4 % ophthalmic solution Place 1-2 drops into both eyes as needed for dry eyes.    . ranitidine (ZANTAC) 150 MG capsule Take 150 mg by mouth 2 (two) times daily.     No current facility-administered medications for this visit.    No Known Allergies   REVIEW OF SYSTEMS:   [X]  denotes positive finding, [ ]  denotes negative finding Cardiac  Comments:  Chest pain or chest pressure:    Shortness of breath upon exertion:    Short of breath when lying flat:    Irregular heart rhythm:        Vascular    Pain in calf, thigh, or hip brought on by ambulation:    Pain in feet at night that wakes you up from your sleep:   X lateral ankle  Blood clot in your  veins:    Leg swelling:         Pulmonary    Oxygen at home:    Productive cough:     Wheezing:         Neurologic    Sudden weakness in arms or legs:     Sudden numbness in arms or legs:     Sudden onset of difficulty speaking or  slurred speech:    Temporary loss of vision in one eye:     Problems with dizziness:         Gastrointestinal    Blood in stool:     Vomited blood:         Genitourinary    Burning when urinating:     Blood in urine:        Psychiatric    Major depression:         Hematologic    Bleeding problems:    Problems with blood clotting too easily:        Skin    Rashes or ulcers:        Constitutional    Fever or chills:      PHYSICAL EXAMINATION:  Vitals:   11/26/19 0838  BP: 140/67  Pulse: 68  Resp: 16  Temp: (!) 97.1 F (36.2 C)  TempSrc: Temporal  SpO2: 91%  Weight: 154 lb (69.9 kg)  Height: 5\' 7"  (1.702 m)    General:  WDWN in NAD; vital signs documented above Gait: Not observed HENT: WNL, normocephalic Pulmonary: normal non-labored breathing Cardiac: regular HR Abdomen: soft, NT, no masses Skin: without rashes Vascular Exam/Pulses:  Right Left  Radial 2+ (normal) 2+ (normal)  DP  brisk by Doppler  brisk by Doppler  PT absent absent   Extremities: No tissue changes of toes; beginning 1 x 1 cm pressure ulcer lateral malleolus painful to touch; brisk right peroneal signal by Doppler; left arm AV fistula with bruit and palpable thrill; no areas of ulcerated or thinning skin concerning for bleed Musculoskeletal: no muscle wasting or atrophy  Neurologic: A&O X 3;  No focal weakness or paresthesias are detected Psychiatric:  The pt has Normal affect.   Non-Invasive Vascular Imaging:   Arterial duplex demonstrates an occluded posterior tibial artery however no other hemodynamically significant stenosis Right ABI 1.15 Left ABI 0.9     ASSESSMENT/PLAN:: 85 y.o. male here for evaluation of right lateral ankle pain at night  for the past 3 to 4 months  - Based on arterial duplex and physical exam right posterior tibial artery is occluded however anterior tibial and peroneal artery are widely patent - Right foot is warm and well-perfused - Patient has an early, small pressure ulcer on lateral malleolus; recommended to avoid all pressure to his lateral ankle and to avoid sleeping on his right side - No indication for revascularization given patent anterior tibial artery, dorsalis pedis artery, and peroneal artery unless patient is unable to heel ulcer with the above recommendation - Also recommended to start 81 mg of aspirin daily if able to tolerate - Patient may follow-up on an as-needed basis  Dagoberto Ligas, PA-C [facility] [phone]  Clinic MD:   Scot Dock

## 2020-11-02 ENCOUNTER — Encounter (HOSPITAL_COMMUNITY): Payer: Self-pay

## 2020-11-02 ENCOUNTER — Ambulatory Visit (HOSPITAL_COMMUNITY)
Admission: EM | Admit: 2020-11-02 | Discharge: 2020-11-02 | Disposition: A | Discharge status: Home / Self Care | Payer: Medicare Other | Attending: Family Medicine | Admitting: Family Medicine

## 2020-11-02 ENCOUNTER — Other Ambulatory Visit: Payer: Self-pay

## 2020-11-02 DIAGNOSIS — B349 Viral infection, unspecified: Secondary | ICD-10-CM | POA: Diagnosis not present

## 2020-11-02 LAB — POCT URINALYSIS DIPSTICK, ED / UC
Bilirubin Urine: NEGATIVE
Glucose, UA: NEGATIVE mg/dL
Ketones, ur: NEGATIVE mg/dL
Leukocytes,Ua: NEGATIVE
Nitrite: NEGATIVE
Protein, ur: >=300 mg/dL — AB
Specific Gravity, Urine: 1.02 (ref 1.005–1.030)
Urobilinogen, UA: 0.2 mg/dL (ref 0.0–1.0)
pH: 8.5 — ABNORMAL HIGH (ref 5.0–8.0)

## 2020-11-02 MED ORDER — ACETAMINOPHEN 325 MG PO TABS
ORAL_TABLET | ORAL | Status: AC
  Filled 2020-11-02: qty 2

## 2020-11-02 MED ORDER — ACETAMINOPHEN 325 MG PO TABS
650.0000 mg | ORAL_TABLET | Freq: Once | ORAL | Status: AC
  Administered 2020-11-02: 650 mg via ORAL

## 2020-11-02 NOTE — Discharge Instructions (Addendum)
Continue dialysis as before Take Tylenol as needed for aching

## 2020-11-02 NOTE — ED Triage Notes (Signed)
Pt presents with generalized body aches and nasal drainage X 2 days.

## 2020-11-02 NOTE — ED Provider Notes (Signed)
[facility]    CSN: [id number] Arrival date & time: 11/02/20  1559      History   Chief Complaint Chief Complaint  Patient presents with  . Generalized Body Aches  . Nasal Drainage    HPI RAWLINS STUARD is a 86 y.o. male.   Patient has chronic kidney disease and is maintained on dialysis.  Also has a monoclonal gammopathy.  Apparently had some chills this morning but did not check his temperature.  He did not go to dialysis today since he felt so bad.  Also has iron deficiency secondary to CKD  HPI  Past Medical History:  Diagnosis Date  . Adenomatous polyps   . Bursitis    left hip  . Chronic kidney disease    Stage 4  . Frequent urination at night   . GERD (gastroesophageal reflux disease)   . Hypertension   . Hypothyroidism   . Pneumonia   . Polyclonal gammopathy determined by serum protein electrophoresis 10/04/2015    Patient Active Problem List   Diagnosis Date Noted  . ESRD on dialysis ([place]) 08/24/2016  . Monoclonal gammopathy 10/06/2015  . Gammopathy, monoclonal   . Polyclonal gammopathy determined by serum protein electrophoresis 10/04/2015  . Depression 03/29/2015  . Intertrigo 05/30/2014  . Proteinuria 05/05/2014    Past Surgical History:  Procedure Laterality Date  . AORTIC ARCH ANGIOGRAPHY N/A 08/01/2017   Procedure: Aortic Arch Angiography;  Surgeon: Conrad Hilmar-Irwin, MD;  Location: [facility];  Service: Cardiovascular;  Laterality: N/A;  . AV FISTULA PLACEMENT Left 07/10/2016   Procedure: LEFT BRACHIOCEPHALIC ARTERIOVENOUS (AV) FISTULA CREATION;  Surgeon: Conrad Cheriton, MD;  Location: [facility];  Service: Vascular;  Laterality: Left;  . COLONOSCOPY    . RENAL BIOPSY Left 10/06/2015  . REVISON OF ARTERIOVENOUS FISTULA Left 40/98/1191   Procedure: PLICATION OF LEFT BRACHIOCEPHALIC ARTERIOVENOUS FISTULA;  Surgeon: Conrad Sardis, MD;  Location: [facility];  Service: Vascular;  Laterality: Left;  . SKIN SURGERY     back  . UPPER EXTREMITY  ANGIOGRAPHY Left 08/01/2017   Procedure: Upper Extremity Angiography;  Surgeon: Conrad Orcutt, MD;  Location: [facility];  Service: Cardiovascular;  Laterality: Left;       Home Medications    Prior to Admission medications   Medication Sig Start Date End Date Taking? Authorizing Provider  amLODipine (NORVASC) 5 MG tablet Take 5 mg by mouth at bedtime.  03/23/14   [provider]  amoxicillin-clavulanate (AUGMENTIN) 875-125 MG tablet Take 1 tablet by mouth every 12 (twelve) hours. 12/30/18   Robyn Haber, MD  calcium acetate (PHOSLO) 667 MG capsule Take 1 capsule by mouth 3 (three) times daily with meals.  04/29/17   [provider]  Cyanocobalamin (VITAMIN B-12 PO) Take 1 tablet by mouth daily.    [provider]  diphenhydramine-acetaminophen (TYLENOL PM) 25-500 MG TABS tablet Take 1 tablet by mouth at bedtime as needed (sleep).    [provider]  escitalopram (LEXAPRO) 10 MG tablet Take 10 mg by mouth at bedtime.  06/14/16   [provider]  levothyroxine (SYNTHROID, LEVOTHROID) 88 MCG tablet Take 88 mcg by mouth at bedtime.    [provider]  lidocaine-prilocaine (EMLA) cream Apply 1 application topically daily as needed (port access).  02/06/17   [provider]  mirabegron ER (MYRBETRIQ) 50 MG TB24 tablet Take 50 mg by mouth at bedtime.     [provider]  multivitamin (RENA-VIT) TABS  tablet Take 1 tablet by mouth daily.    [provider]  polyethylene glycol (MIRALAX) packet Take 17 g by mouth daily. 12/30/18   Robyn Haber, MD  polyvinyl alcohol (LIQUIFILM TEARS) 1.4 % ophthalmic solution Place 1-2 drops into both eyes as needed for dry eyes.    [provider]  ranitidine (ZANTAC) 150 MG capsule Take 150 mg by mouth 2 (two) times daily.    [provider]    Family History Family History  Problem Relation Age of Onset  . Diabetes Father   . Diabetes Brother   . Heart  disease Brother   . Colon cancer Neg Hx   . Rectal cancer Neg Hx     Social History Social History   Tobacco Use  . Smoking status: Former Smoker    Quit date: 01/25/1980    Years since quitting: 40.8  . Smokeless tobacco: Never Used  Vaping Use  . Vaping Use: Never used  Substance Use Topics  . Alcohol use: No    Comment: former alcoholic 32 years ago was last drink  . Drug use: No     Allergies   Patient has no known allergies.   Review of Systems Review of Systems   Physical Exam Triage Vital Signs ED Triage Vitals  Enc Vitals Group     BP 11/02/20 1754 (!) 151/59     Pulse Rate 11/02/20 1754 70     Resp 11/02/20 1754 16     Temp 11/02/20 1754 97.8 F (36.6 C)     Temp Source 11/02/20 1754 Oral     SpO2 11/02/20 1754 100 %     Weight --      Height --      Head Circumference --      Peak Flow --      Pain Score 11/02/20 1753 4     Pain Loc --      Pain Edu? --      Excl. in [place]? --    No data found.  Updated Vital Signs BP (!) 151/59 (BP Location: Right Arm)   Pulse 70   Temp 97.8 F (36.6 C) (Oral)   Resp 16   SpO2 100%   Visual Acuity Right Eye Distance:   Left Eye Distance:   Bilateral Distance:    Right Eye Near:   Left Eye Near:    Bilateral Near:     Physical Exam Vitals and nursing note reviewed.  Constitutional:      Appearance: He is ill-appearing.  HENT:     Head: Normocephalic.     Nose: Nose normal.     Mouth/Throat:     Mouth: Mucous membranes are moist.  Cardiovascular:     Rate and Rhythm: Normal rate. Rhythm irregular.  Pulmonary:     Effort: Pulmonary effort is normal.     Breath sounds: Normal breath sounds.  Neurological:     General: No focal deficit present.     Mental Status: He is alert and oriented to person, place, and time.  Psychiatric:        Mood and Affect: Mood normal.        Behavior: Behavior normal.      UC Treatments / Results  Labs (all labs ordered are listed, but only abnormal results  are displayed) Labs Reviewed  POCT URINALYSIS DIPSTICK, ED / UC    EKG   Radiology No results found.  Procedures Procedures (including critical care time)  Medications Ordered  in UC Medications - No data to display  Initial Impression / Assessment and Plan / UC Course  I have reviewed the triage vital signs and the nursing notes.  Pertinent labs & imaging results that were available during my care of the patient were reviewed by me and considered in my medical decision making (see chart for details).     Probable viral syndrome manifested by rhinorrhea and myalgias.  Will treat symptomatically with Tylenol for myalgias.  Patient has no fever no cough and lungs are clear. Final Clinical Impressions(s) / UC Diagnoses   Final diagnoses:  None   Discharge Instructions   None    ED Prescriptions    None     PDMP not reviewed this encounter.   Wardell Honour, MD 11/02/20 [phone]

## 2021-01-23 ENCOUNTER — Emergency Department (HOSPITAL_COMMUNITY)
Admission: EM | Admit: 2021-01-23 | Discharge: 2021-01-23 | Disposition: A | Discharge status: Home / Self Care | Payer: Medicare Other | Attending: Emergency Medicine | Admitting: Emergency Medicine

## 2021-01-23 ENCOUNTER — Encounter (HOSPITAL_COMMUNITY): Payer: Self-pay

## 2021-01-23 ENCOUNTER — Ambulatory Visit (HOSPITAL_COMMUNITY)
Admission: EM | Admit: 2021-01-23 | Discharge: 2021-01-23 | Disposition: A | Discharge status: Home / Self Care | Payer: POS

## 2021-01-23 ENCOUNTER — Other Ambulatory Visit: Payer: Self-pay

## 2021-01-23 DIAGNOSIS — Z5321 Procedure and treatment not carried out due to patient leaving prior to being seen by health care provider: Secondary | ICD-10-CM | POA: Insufficient documentation

## 2021-01-23 DIAGNOSIS — R0902 Hypoxemia: Secondary | ICD-10-CM | POA: Insufficient documentation

## 2021-01-23 NOTE — ED Triage Notes (Signed)
Pt presents with cough, sneezing, lower back pain and vomiting (4 times) today after dialysis 12:00. Denies chest pain, SOB, vision changes, dizziness.

## 2021-01-23 NOTE — ED Notes (Signed)
After checking the pt's vitals and seeing that the pt's oxygen level was 99%, the pt's visitor said he was going to take him home to lay down.

## 2021-01-26 ENCOUNTER — Ambulatory Visit (HOSPITAL_COMMUNITY)
Admission: EM | Admit: 2021-01-26 | Discharge: 2021-01-26 | Disposition: A | Discharge status: Home / Self Care | Payer: Medicare Other | Attending: Emergency Medicine | Admitting: Emergency Medicine

## 2021-01-26 ENCOUNTER — Encounter (HOSPITAL_COMMUNITY): Payer: Self-pay

## 2021-01-26 ENCOUNTER — Other Ambulatory Visit: Payer: Self-pay

## 2021-01-26 DIAGNOSIS — S86912A Strain of unspecified muscle(s) and tendon(s) at lower leg level, left leg, initial encounter: Secondary | ICD-10-CM | POA: Diagnosis not present

## 2021-01-26 DIAGNOSIS — Y92009 Unspecified place in unspecified non-institutional (private) residence as the place of occurrence of the external cause: Secondary | ICD-10-CM | POA: Diagnosis not present

## 2021-01-26 DIAGNOSIS — W19XXXA Unspecified fall, initial encounter: Secondary | ICD-10-CM | POA: Diagnosis not present

## 2021-01-26 DIAGNOSIS — M79662 Pain in left lower leg: Secondary | ICD-10-CM

## 2021-01-26 MED ORDER — ACETAMINOPHEN 325 MG PO TABS: 650.0000 mg | ORAL_TABLET | Freq: Four times a day (QID) | ORAL | 1 refills | Status: AC | PRN

## 2021-01-26 MED ORDER — DICLOFENAC SODIUM 1 % EX GEL: 4.0000 g | Freq: Four times a day (QID) | CUTANEOUS | 1 refills | Status: DC

## 2021-01-26 NOTE — ED Triage Notes (Signed)
Per pt he fell on the bathroom today around 4:00 am and injured his left leg. Denies he hit the head. Denies headache, vision changes, dizziness.   Pt reports cough x 3 days.  Pt reports having diarrhea since this morning. Denies abdominal pain.

## 2021-01-26 NOTE — Discharge Instructions (Addendum)
Can use diclofenac gel four times a day as needed for comfort  Can use tylenol three times a day as needed for pain  Can use heating pad in 15 minutes intervals as needed  Stay hydrated throughout day  Follow up if pain persist or worsens, follow up if diarrhea continues, fever, chills, decrease appetite or fluid intake

## 2021-01-26 NOTE — ED Provider Notes (Addendum)
[place]    CSN: [id number] Arrival date & time: 01/26/21  1322      History   Chief Complaint Chief Complaint  Patient presents with  . Fall  . Diarrhea    HPI Lonnie [name] is a 87 y.o. male.   Patient presents with left lower leg pain when bearing weight 7/10 after falling and getting caught between the door in the bathroom. ROM intact. Denies hitting head, loss of consciousness, headache, dizziness, visual changes. History of gammopathy, ESRD on dialysis.  C/o of diarrhea starting this morning described as soft and loose. 3-4 episodes so far. Tolerating food and liquid. Denies abdominal pain, fever, chills, dietary changes, new food, sick contacts, blood in stool.   Past Medical History:  Diagnosis Date  . Adenomatous polyps   . Bursitis    left hip  . Chronic kidney disease    Stage 4  . Frequent urination at night   . GERD (gastroesophageal reflux disease)   . Hypertension   . Hypothyroidism   . Pneumonia   . Polyclonal gammopathy determined by serum protein electrophoresis 10/04/2015    Patient Active Problem List   Diagnosis Date Noted  . ESRD on dialysis ([place]) 08/24/2016  . Monoclonal gammopathy 10/06/2015  . Gammopathy, monoclonal   . Polyclonal gammopathy determined by serum protein electrophoresis 10/04/2015  . Depression 03/29/2015  . Intertrigo 05/30/2014  . Proteinuria 05/05/2014    Past Surgical History:  Procedure Laterality Date  . AORTIC ARCH ANGIOGRAPHY N/A 08/01/2017   Procedure: Aortic Arch Angiography;  Surgeon: Conrad [place], MD;  Location: [facility];  Service: Cardiovascular;  Laterality: N/A;  . AV FISTULA PLACEMENT Left 07/10/2016   Procedure: LEFT BRACHIOCEPHALIC ARTERIOVENOUS (AV) FISTULA CREATION;  Surgeon: Conrad Freeland, MD;  Location: [facility];  Service: Vascular;  Laterality: Left;  . COLONOSCOPY    . RENAL BIOPSY Left 10/06/2015  . REVISON OF ARTERIOVENOUS FISTULA Left 43/15/4008   Procedure: PLICATION OF  LEFT BRACHIOCEPHALIC ARTERIOVENOUS FISTULA;  Surgeon: Conrad Barnum, MD;  Location: [facility];  Service: Vascular;  Laterality: Left;  . SKIN SURGERY     back  . UPPER EXTREMITY ANGIOGRAPHY Left 08/01/2017   Procedure: Upper Extremity Angiography;  Surgeon: Conrad , MD;  Location: [facility];  Service: Cardiovascular;  Laterality: Left;       Home Medications    Prior to Admission medications   Medication Sig Start Date End Date Taking? Authorizing Provider  acetaminophen (TYLENOL) 325 MG tablet Take 2 tablets (650 mg total) by mouth every 6 (six) hours as needed for mild pain. 01/26/21  Yes White, Adrienne R, NP  diclofenac Sodium (VOLTAREN) 1 % GEL Apply 4 g topically 4 (four) times daily. 01/26/21  Yes White, Adrienne R, NP  amLODipine (NORVASC) 5 MG tablet Take 5 mg by mouth at bedtime.  03/23/14   [provider]  amoxicillin-clavulanate (AUGMENTIN) 875-125 MG tablet Take 1 tablet by mouth every 12 (twelve) hours. 12/30/18   Robyn Haber, MD  calcium acetate (PHOSLO) 667 MG capsule Take 1 capsule by mouth 3 (three) times daily with meals.  04/29/17   [provider]  Cyanocobalamin (VITAMIN B-12 PO) Take 1 tablet by mouth daily.    [provider]  diphenhydramine-acetaminophen (TYLENOL PM) 25-500 MG TABS tablet Take 1 tablet by mouth at bedtime as needed (sleep).    [provider]  escitalopram (LEXAPRO) 10 MG tablet Take 10 mg by mouth at bedtime.  06/14/16   [provider]  levothyroxine (SYNTHROID, LEVOTHROID) 88 MCG tablet Take 88 mcg by mouth at bedtime.    [provider]  lidocaine-prilocaine (EMLA) cream Apply 1 application topically daily as needed (port access).  02/06/17   [provider]  mirabegron ER (MYRBETRIQ) 50 MG TB24 tablet Take 50 mg by mouth at bedtime.     [provider]  multivitamin (RENA-VIT) TABS tablet Take 1 tablet by mouth daily.    [provider]  polyethylene glycol  (MIRALAX) packet Take 17 g by mouth daily. 12/30/18   Robyn Haber, MD  polyvinyl alcohol (LIQUIFILM TEARS) 1.4 % ophthalmic solution Place 1-2 drops into both eyes as needed for dry eyes.    [provider]  ranitidine (ZANTAC) 150 MG capsule Take 150 mg by mouth 2 (two) times daily.    [provider]    Family History Family History  Problem Relation Age of Onset  . Diabetes Father   . Diabetes Brother   . Heart disease Brother   . Colon cancer Neg Hx   . Rectal cancer Neg Hx     Social History Social History   Tobacco Use  . Smoking status: Former Smoker    Quit date: 01/25/1980    Years since quitting: 41.0  . Smokeless tobacco: Never Used  Vaping Use  . Vaping Use: Never used  Substance Use Topics  . Alcohol use: No    Comment: former alcoholic 32 years ago was last drink  . Drug use: No     Allergies   Patient has no known allergies.   Review of Systems Review of Systems  Constitutional: Negative.   HENT: Negative.   Respiratory: Negative.   Cardiovascular: Negative.   Gastrointestinal: Positive for diarrhea. Negative for abdominal distention, abdominal pain, anal bleeding, blood in stool, constipation, nausea, rectal pain and vomiting.  Genitourinary: Negative.   Musculoskeletal: Negative.   Skin: Negative.   Neurological: Negative.      Physical Exam Triage Vital Signs ED Triage Vitals  Enc Vitals Group     BP 01/26/21 1343 112/82     Pulse Rate 01/26/21 1343 99     Resp 01/26/21 1343 (!) 25     Temp 01/26/21 1343 98.7 F (37.1 C)     Temp Source 01/26/21 1343 Oral     SpO2 01/26/21 1343 95 %     Weight --      Height --      Head Circumference --      Peak Flow --      Pain Score 01/26/21 1353 7     Pain Loc --      Pain Edu? --      Excl. in [place]? --    No data found.  Updated Vital Signs BP 112/82 (BP Location: Right Arm)   Pulse 99   Temp 98.7 F (37.1 C) (Oral)   Resp (!) 25   SpO2 95%   Visual  Acuity Right Eye Distance:   Left Eye Distance:   Bilateral Distance:    Right Eye Near:   Left Eye Near:    Bilateral Near:     Physical Exam Constitutional:      Appearance: Normal appearance. He is normal weight.  HENT:     Head: Normocephalic.  Eyes:     Extraocular Movements: Extraocular movements intact.  Pulmonary:     Effort: Pulmonary effort is normal.  Musculoskeletal:  General: Normal range of motion.     Right lower leg: Normal. No edema.     Left lower leg: Normal. No swelling, deformity, lacerations, tenderness or bony tenderness. No edema.       Legs:  Neurological:     General: No focal deficit present.     Mental Status: He is alert and oriented to person, place, and time. Mental status is at baseline.  Psychiatric:        Mood and Affect: Mood normal.        Behavior: Behavior normal.        Thought Content: Thought content normal.        Judgment: Judgment normal.      UC Treatments / Results  Labs (all labs ordered are listed, but only abnormal results are displayed) Labs Reviewed - No data to display  EKG   Radiology No results found.  Procedures Procedures (including critical care time)  Medications Ordered in UC Medications - No data to display  Initial Impression / Assessment and Plan / UC Course  I have reviewed the triage vital signs and the nursing notes.  Pertinent labs & imaging results that were available during my care of the patient were reviewed by me and considered in my medical decision making (see chart for details).  Pain of left lower leg   1. Pain only present when initially standing up, no tenderness noted in any area of leg with palpation, doubtful of injury or fracture 2. Diclofenac 1% gel four times a day as needed 3. Tylenol 650 mg three times a day  4. Diarrhea presented today with no associated symptoms, advised staying hydrated and waiting to see if it resolves. Discussed when to return for follow up  and concerns related to diarrhea, son verbalized understanding of plan  Final Clinical Impressions(s) / UC Diagnoses   Final diagnoses:  Fall in home, initial encounter     Discharge Instructions     Can use diclofenac gel four times a day as needed for comfort  Can use tylenol three times a day as needed for pain  Can use heating pad in 15 minutes intervals as needed  Stay hydrated throughout day  Follow up if pain persist or worsens, follow up if diarrhea continues, fever, chills, decrease appetite or fluid intake    ED Prescriptions    Medication Sig Dispense Auth. Provider   diclofenac Sodium (VOLTAREN) 1 % GEL Apply 4 g topically 4 (four) times daily. 100 g Lowella Petties R, NP   acetaminophen (TYLENOL) 325 MG tablet Take 2 tablets (650 mg total) by mouth every 6 (six) hours as needed for mild pain. 30 tablet Hans Eden, NP     PDMP not reviewed this encounter.   Hans Eden, NP 01/26/21 1507    Hans Eden, NP 01/30/21 1256    Hans Eden, [place] 03/21/21 1556

## 2021-04-10 ENCOUNTER — Other Ambulatory Visit: Payer: Self-pay

## 2021-04-10 ENCOUNTER — Ambulatory Visit (HOSPITAL_COMMUNITY)
Admission: EM | Admit: 2021-04-10 | Discharge: 2021-04-10 | Disposition: A | Discharge status: Home / Self Care | Payer: POS | Attending: Internal Medicine | Admitting: Internal Medicine

## 2021-04-10 DIAGNOSIS — K2900 Acute gastritis without bleeding: Secondary | ICD-10-CM | POA: Diagnosis not present

## 2021-04-10 DIAGNOSIS — M549 Dorsalgia, unspecified: Secondary | ICD-10-CM

## 2021-04-10 MED ORDER — PANTOPRAZOLE SODIUM 20 MG PO TBEC: 20.0000 mg | DELAYED_RELEASE_TABLET | Freq: Every day | ORAL | 0 refills | Status: DC

## 2021-04-10 MED ORDER — DICLOFENAC SODIUM 1 % EX GEL: 4.0000 g | Freq: Four times a day (QID) | CUTANEOUS | 0 refills | Status: AC

## 2021-04-10 NOTE — Discharge Instructions (Signed)
Gentle range of motion exercises Take medications as prescribed Tylenol and diclofenac gel to be used as needed for back pain. If you have worsening symptoms please return to urgent care to be reevaluated.

## 2021-04-10 NOTE — ED Triage Notes (Addendum)
Pt reports stomach pain since eating dinner last night. Denies nausea or vomiting but does endorse some back pain. States stomach hurt more while laying down. States pain is all over stomach, no specific region.   Pt nephew (acting as historian for pt) states pt was recently placed on medication for sleep and for appetite but does not remember the name of the medication. Pt and nephew do not have current med list available at this time but state these are the only recent changes they know of.

## 2021-04-10 NOTE — ED Provider Notes (Addendum)
[facility]    CSN: [id number] Arrival date & time: 04/10/21  4854      History   Chief Complaint Chief Complaint  Patient presents with  . Abdominal Pain    HPI Lonnie [name] is a 87 y.o. male comes to the urgent care with abdominal pain which started yesterday after eating dinner.  Pain is in the epigastric area and of moderate severity.  He denies any nausea or vomiting.  No known relieving factors.  He tried Pepto-Bismol with no improvement in his symptoms.    Patient also complains of mid back pain which started yesterday.  Pain is sharp, aggravated by him laying down on his back.  No known relieving factors.  Pain is currently of mild to moderate severity.  He denies any trauma or falls.  No numbness or tingling in the legs.  No weakness in the legs.[name]   HPI  Past Medical History:  Diagnosis Date  . Adenomatous polyps   . Bursitis    left hip  . Chronic kidney disease    Stage 4  . Frequent urination at night   . GERD (gastroesophageal reflux disease)   . Hypertension   . Hypothyroidism   . Pneumonia   . Polyclonal gammopathy determined by serum protein electrophoresis 10/04/2015    Patient Active Problem List   Diagnosis Date Noted  . ESRD on dialysis ([place]) 08/24/2016  . Monoclonal gammopathy 10/06/2015  . Gammopathy, monoclonal   . Polyclonal gammopathy determined by serum protein electrophoresis 10/04/2015  . Depression 03/29/2015  . Intertrigo 05/30/2014  . Proteinuria 05/05/2014    Past Surgical History:  Procedure Laterality Date  . AORTIC ARCH ANGIOGRAPHY N/A 08/01/2017   Procedure: Aortic Arch Angiography;  Surgeon: Conrad Delta, MD;  Location: [facility];  Service: Cardiovascular;  Laterality: N/A;  . AV FISTULA PLACEMENT Left 07/10/2016   Procedure: LEFT BRACHIOCEPHALIC ARTERIOVENOUS (AV) FISTULA CREATION;  Surgeon: Conrad Beaver, MD;  Location: [facility];  Service: Vascular;  Laterality: Left;  . COLONOSCOPY    . RENAL BIOPSY Left  10/06/2015  . REVISON OF ARTERIOVENOUS FISTULA Left 62/70/3500   Procedure: PLICATION OF LEFT BRACHIOCEPHALIC ARTERIOVENOUS FISTULA;  Surgeon: Conrad Fellows, MD;  Location: [facility];  Service: Vascular;  Laterality: Left;  . SKIN SURGERY     back  . UPPER EXTREMITY ANGIOGRAPHY Left 08/01/2017   Procedure: Upper Extremity Angiography;  Surgeon: Conrad , MD;  Location: [facility];  Service: Cardiovascular;  Laterality: Left;       Home Medications    Prior to Admission medications   Medication Sig Start Date End Date Taking? Authorizing Provider  diclofenac Sodium (VOLTAREN) 1 % GEL Apply 4 g topically 4 (four) times daily. 04/10/21  Yes Maryella Abood, Myrene Galas, MD  pantoprazole (PROTONIX) 20 MG tablet Take 1 tablet (20 mg total) by mouth daily. 04/10/21  Yes Rocio Wolak, Myrene Galas, MD  acetaminophen (TYLENOL) 325 MG tablet Take 2 tablets (650 mg total) by mouth every 6 (six) hours as needed for mild pain. 01/26/21   White, Leitha Schuller, NP  amLODipine (NORVASC) 5 MG tablet Take 5 mg by mouth at bedtime.  03/23/14   [provider]  calcium acetate (PHOSLO) 667 MG capsule Take 1 capsule by mouth 3 (three) times daily with meals.  04/29/17   [provider]  Cyanocobalamin (VITAMIN B-12 PO) Take 1 tablet by mouth daily.    [provider]  diphenhydramine-acetaminophen (TYLENOL PM) 25-500 MG  TABS tablet Take 1 tablet by mouth at bedtime as needed (sleep).    [provider]  escitalopram (LEXAPRO) 10 MG tablet Take 10 mg by mouth at bedtime.  06/14/16   [provider]  levothyroxine (SYNTHROID, LEVOTHROID) 88 MCG tablet Take 88 mcg by mouth at bedtime.    [provider]  lidocaine-prilocaine (EMLA) cream Apply 1 application topically daily as needed (port access).  02/06/17   [provider]  mirabegron ER (MYRBETRIQ) 50 MG TB24 tablet Take 50 mg by mouth at bedtime.     [provider]  multivitamin (RENA-VIT) TABS tablet Take 1  tablet by mouth daily.    [provider]  polyethylene glycol (MIRALAX) packet Take 17 g by mouth daily. 12/30/18   Robyn Haber, MD  polyvinyl alcohol (LIQUIFILM TEARS) 1.4 % ophthalmic solution Place 1-2 drops into both eyes as needed for dry eyes.    [provider]    Family History Family History  Problem Relation Age of Onset  . Diabetes Father   . Diabetes Brother   . Heart disease Brother   . Colon cancer Neg Hx   . Rectal cancer Neg Hx     Social History Social History   Tobacco Use  . Smoking status: Former Smoker    Quit date: 01/25/1980    Years since quitting: 41.2  . Smokeless tobacco: Never Used  Vaping Use  . Vaping Use: Never used  Substance Use Topics  . Alcohol use: No    Comment: former alcoholic 32 years ago was last drink  . Drug use: No     Allergies   Patient has no known allergies.   Review of Systems Review of Systems  Constitutional: Negative.   Eyes: Negative.   Respiratory: Negative.   Gastrointestinal: Positive for abdominal pain. Negative for constipation, diarrhea, nausea and vomiting.  Genitourinary: Negative.   Musculoskeletal: Positive for back pain. Negative for neck pain and neck stiffness.  Neurological: Negative.      Physical Exam Triage Vital Signs ED Triage Vitals  Enc Vitals Group     BP 04/10/21 1035 (!) 147/40     Pulse Rate 04/10/21 1035 68     Resp 04/10/21 1035 18     Temp 04/10/21 1035 98 F (36.7 C)     Temp src --      SpO2 04/10/21 1035 96 %     Weight --      Height --      Head Circumference --      Peak Flow --      Pain Score 04/10/21 1031 7     Pain Loc --      Pain Edu? --      Excl. in [place]? --    No data found.  Updated Vital Signs BP (!) 147/40   Pulse 68   Temp 98 F (36.7 C)   Resp 18   SpO2 96%   Visual Acuity Right Eye Distance:   Left Eye Distance:   Bilateral Distance:    Right Eye Near:   Left Eye Near:    Bilateral Near:     Physical  Exam Vitals and nursing note reviewed.  Constitutional:      General: He is not in acute distress.    Appearance: He is not ill-appearing.  Cardiovascular:     Rate and Rhythm: Normal rate and regular rhythm.  Abdominal:     General: Abdomen is flat. Bowel sounds are normal.  Palpations: Abdomen is soft. There is no shifting dullness, hepatomegaly or splenomegaly.     Tenderness: There is no abdominal tenderness.     Hernia: No hernia is present.  Neurological:     General: No focal deficit present.     Mental Status: He is alert.      UC Treatments / Results  Labs (all labs ordered are listed, but only abnormal results are displayed) Labs Reviewed - No data to display  EKG   Radiology No results found.  Procedures Procedures (including critical care time)  Medications Ordered in UC Medications - No data to display  Initial Impression / Assessment and Plan / UC Course  I have reviewed the triage vital signs and the nursing notes.  Pertinent labs & imaging results that were available during my care of the patient were reviewed by me and considered in my medical decision making (see chart for details).     1.  Acute gastritis: Protonix 20 mg orally daily Increase oral fluid intake Return to urgent care if symptoms worsen  2.  Mid back pain: Voltaren gel as needed Gentle range of motion exercises Tylenol as needed for pain Return precautions given. Final Clinical Impressions(s) / UC Diagnoses   Final diagnoses:  Acute superficial gastritis without hemorrhage  Mid back pain     Discharge Instructions     Gentle range of motion exercises Take medications as prescribed Tylenol and diclofenac gel to be used as needed for back pain. If you have worsening symptoms please return to urgent care to be reevaluated.   ED Prescriptions    Medication Sig Dispense Auth. Provider   pantoprazole (PROTONIX) 20 MG tablet Take 1 tablet (20 mg total) by mouth daily.  30 tablet Aryanna Shaver, Myrene Galas, MD   diclofenac Sodium (VOLTAREN) 1 % GEL Apply 4 g topically 4 (four) times daily. 150 g Desera Graffeo, Myrene Galas, MD     PDMP not reviewed this encounter.   Chase Picket, MD 04/10/21 1138    Chase Picket, MD 04/10/21 [phone]

## 2021-05-24 ENCOUNTER — Other Ambulatory Visit: Payer: Self-pay

## 2021-05-24 ENCOUNTER — Ambulatory Visit (HOSPITAL_COMMUNITY)
Admission: EM | Admit: 2021-05-24 | Discharge: 2021-05-24 | Disposition: A | Discharge status: Home / Self Care | Payer: POS | Attending: Medical Oncology | Admitting: Medical Oncology

## 2021-05-24 ENCOUNTER — Encounter (HOSPITAL_COMMUNITY): Payer: Self-pay | Admitting: Emergency Medicine

## 2021-05-24 DIAGNOSIS — S50811A Abrasion of right forearm, initial encounter: Secondary | ICD-10-CM | POA: Diagnosis not present

## 2021-05-24 DIAGNOSIS — T148XXA Other injury of unspecified body region, initial encounter: Secondary | ICD-10-CM

## 2021-05-24 MED ORDER — ACETAMINOPHEN 325 MG PO TABS
650.0000 mg | ORAL_TABLET | Freq: Once | ORAL | Status: AC
  Administered 2021-05-24: 650 mg via ORAL

## 2021-05-24 MED ORDER — ACETAMINOPHEN 325 MG PO TABS
ORAL_TABLET | ORAL | Status: AC
  Filled 2021-05-24: qty 2

## 2021-05-24 NOTE — ED Provider Notes (Signed)
[facility]    CSN: [id number] Arrival date & time: 05/24/21  1720      History   Chief Complaint Chief Complaint  Patient presents with   Fall    HPI Lonnie [name] is a 87 y.o. male. Pt is here with his caregiver.   HPI  Abrasion: Pt reports that he lost his balance while trying to get up on a scale at dialysis today. He states that he scraped his right arm on the scale during this event. No other injuries and no hitting of the head of LOC. He reports that the abrasion bled some but has stopped. He has not tried anything for symptoms.   Past Medical History:  Diagnosis Date   Adenomatous polyps    Bursitis    left hip   Chronic kidney disease    Stage 4   Frequent urination at night    GERD (gastroesophageal reflux disease)    Hypertension    Hypothyroidism    Pneumonia    Polyclonal gammopathy determined by serum protein electrophoresis 10/04/2015    Patient Active Problem List   Diagnosis Date Noted   ESRD on dialysis ([place]) 08/24/2016   Monoclonal gammopathy 10/06/2015   Gammopathy, monoclonal    Polyclonal gammopathy determined by serum protein electrophoresis 10/04/2015   Depression 03/29/2015   Intertrigo 05/30/2014   Proteinuria 05/05/2014    Past Surgical History:  Procedure Laterality Date   AORTIC ARCH ANGIOGRAPHY N/A 08/01/2017   Procedure: Aortic Arch Angiography;  Surgeon: Conrad Brainards, MD;  Location: [facility];  Service: Cardiovascular;  Laterality: N/A;   AV FISTULA PLACEMENT Left 07/10/2016   Procedure: LEFT BRACHIOCEPHALIC ARTERIOVENOUS (AV) FISTULA CREATION;  Surgeon: Conrad [place], MD;  Location: [facility];  Service: Vascular;  Laterality: Left;   COLONOSCOPY     RENAL BIOPSY Left 10/06/2015   REVISON OF ARTERIOVENOUS FISTULA Left 16/12/930   Procedure: PLICATION OF LEFT BRACHIOCEPHALIC ARTERIOVENOUS FISTULA;  Surgeon: Conrad Coates, MD;  Location: [facility];  Service: Vascular;  Laterality: Left;   SKIN SURGERY     back    UPPER EXTREMITY ANGIOGRAPHY Left 08/01/2017   Procedure: Upper Extremity Angiography;  Surgeon: Conrad [place], MD;  Location: [facility];  Service: Cardiovascular;  Laterality: Left;       Home Medications    Prior to Admission medications   Medication Sig Start Date End Date Taking? Authorizing Provider  acetaminophen (TYLENOL) 325 MG tablet Take 2 tablets (650 mg total) by mouth every 6 (six) hours as needed for mild pain. 01/26/21   White, Leitha Schuller, NP  amLODipine (NORVASC) 5 MG tablet Take 5 mg by mouth at bedtime.  03/23/14   [provider]  calcium acetate (PHOSLO) 667 MG capsule Take 1 capsule by mouth 3 (three) times daily with meals.  04/29/17   [provider]  Cyanocobalamin (VITAMIN B-12 PO) Take 1 tablet by mouth daily.    [provider]  diclofenac Sodium (VOLTAREN) 1 % GEL Apply 4 g topically 4 (four) times daily. 04/10/21   Lamptey, Myrene Galas, MD  diphenhydramine-acetaminophen (TYLENOL PM) 25-500 MG TABS tablet Take 1 tablet by mouth at bedtime as needed (sleep).    [provider]  escitalopram (LEXAPRO) 10 MG tablet Take 10 mg by mouth at bedtime.  06/14/16   [provider]  levothyroxine (SYNTHROID, LEVOTHROID) 88 MCG tablet Take 88 mcg by mouth at bedtime.    [provider]  lidocaine-prilocaine (EMLA)  cream Apply 1 application topically daily as needed (port access).  02/06/17   [provider]  mirabegron ER (MYRBETRIQ) 50 MG TB24 tablet Take 50 mg by mouth at bedtime.     [provider]  multivitamin (RENA-VIT) TABS tablet Take 1 tablet by mouth daily.    [provider]  pantoprazole (PROTONIX) 20 MG tablet Take 1 tablet (20 mg total) by mouth daily. 04/10/21   Lamptey, Myrene Galas, MD  polyethylene glycol [facility]) packet Take 17 g by mouth daily. 12/30/18   Robyn Haber, MD  polyvinyl alcohol (LIQUIFILM TEARS) 1.4 % ophthalmic solution Place 1-2 drops into both eyes as needed for dry  eyes.    [provider]    Family History Family History  Problem Relation Age of Onset   Diabetes Father    Diabetes Brother    Heart disease Brother    Colon cancer Neg Hx    Rectal cancer Neg Hx     Social History Social History   Tobacco Use   Smoking status: Former    Pack years: 0.00    Types: Cigarettes    Quit date: 01/25/1980    Years since quitting: 41.3   Smokeless tobacco: Never  Vaping Use   Vaping Use: Never used  Substance Use Topics   Alcohol use: No    Comment: former alcoholic 32 years ago was last drink   Drug use: No     Allergies   Patient has no known allergies.   Review of Systems Review of Systems  As stated above in HPI Physical Exam Triage Vital Signs ED Triage Vitals  Enc Vitals Group     BP 05/24/21 1829 (!) 147/58     Pulse Rate 05/24/21 1829 67     Resp --      Temp 05/24/21 1829 98.9 F (37.2 C)     Temp Source 05/24/21 1829 Oral     SpO2 05/24/21 1829 99 %     Weight --      Height --      Head Circumference --      Peak Flow --      Pain Score 05/24/21 1826 0     Pain Loc --      Pain Edu? --      Excl. in [place]? --    No data found.  Updated Vital Signs BP (!) 147/58 (BP Location: Right Arm)   Pulse 67   Temp 98.9 F (37.2 C) (Oral)   SpO2 99%   Physical Exam Vitals and nursing note reviewed.  Constitutional:      Appearance: Normal appearance.  Skin:    General: Skin is warm.     Comments: 3 inch abrasion of the right forearm.   Neurological:     Mental Status: He is alert and oriented to person, place, and time. Mental status is at baseline.  Psychiatric:        Behavior: Behavior normal.     UC Treatments / Results  Labs (all labs ordered are listed, but only abnormal results are displayed) Labs Reviewed - No data to display  EKG   Radiology No results found.  Procedures Procedures (including critical care time)  Medications Ordered in UC Medications  acetaminophen (TYLENOL)  tablet 650 mg (650 mg Oral Given 05/24/21 1907)    Initial Impression / Assessment and Plan / UC Course  I have reviewed the triage vital signs and the nursing notes.  Pertinent labs & imaging  results that were available during my care of the patient were reviewed by me and considered in my medical decision making (see chart for details).     New.  Due to the extreme frail nature of his skin it was decided that using Dermabond would be the best course of action for patient.  This is also his preference of treatment.  The abrasion approximated well.  He is up-to-date on his Tdap so he does not need this at this time.  The area was cleaned and not dirty so no antibiotics needed at this time.  Discussed risks and benefits. Final Clinical Impressions(s) / UC Diagnoses   Final diagnoses:  None   Discharge Instructions   None    ED Prescriptions   None    PDMP not reviewed this encounter.   Hughie Closs, [place] 05/24/21 1934

## 2021-05-24 NOTE — ED Triage Notes (Signed)
Patient fell at dialysis.  States he was getting off scale and fell, skin tear to right wrist.  Patient did not hit head, no loc.  Denies any other injury

## 2021-06-07 ENCOUNTER — Emergency Department (HOSPITAL_COMMUNITY)
Admission: EM | Admit: 2021-06-07 | Discharge: 2021-06-08 | Disposition: A | Discharge status: Home / Self Care | Payer: PRIVATE HEALTH INSURANCE | Attending: Emergency Medicine | Admitting: Emergency Medicine

## 2021-06-07 ENCOUNTER — Encounter (HOSPITAL_COMMUNITY): Payer: Self-pay | Admitting: Emergency Medicine

## 2021-06-07 DIAGNOSIS — T829XXA Unspecified complication of cardiac and vascular prosthetic device, implant and graft, initial encounter: Secondary | ICD-10-CM

## 2021-06-07 DIAGNOSIS — T82590A Other mechanical complication of surgically created arteriovenous fistula, initial encounter: Secondary | ICD-10-CM | POA: Insufficient documentation

## 2021-06-07 DIAGNOSIS — Z992 Dependence on renal dialysis: Secondary | ICD-10-CM | POA: Insufficient documentation

## 2021-06-07 DIAGNOSIS — N186 End stage renal disease: Secondary | ICD-10-CM | POA: Diagnosis not present

## 2021-06-07 DIAGNOSIS — Y840 Cardiac catheterization as the cause of abnormal reaction of the patient, or of later complication, without mention of misadventure at the time of the procedure: Secondary | ICD-10-CM | POA: Diagnosis not present

## 2021-06-07 DIAGNOSIS — E039 Hypothyroidism, unspecified: Secondary | ICD-10-CM | POA: Diagnosis not present

## 2021-06-07 DIAGNOSIS — Z79899 Other long term (current) drug therapy: Secondary | ICD-10-CM | POA: Insufficient documentation

## 2021-06-07 DIAGNOSIS — I120 Hypertensive chronic kidney disease with stage 5 chronic kidney disease or end stage renal disease: Secondary | ICD-10-CM | POA: Diagnosis not present

## 2021-06-07 DIAGNOSIS — Z87891 Personal history of nicotine dependence: Secondary | ICD-10-CM | POA: Insufficient documentation

## 2021-06-07 LAB — HEMOGLOBIN AND HEMATOCRIT, BLOOD
HCT: 35.4 % — ABNORMAL LOW (ref 39.0–52.0)
Hemoglobin: 10.9 g/dL — ABNORMAL LOW (ref 13.0–17.0)

## 2021-06-07 NOTE — ED Triage Notes (Signed)
Pt here from dialysis unit for a possible graft that is not bleeding , pt has no complaints , 4 liters otc

## 2021-06-07 NOTE — ED Provider Notes (Signed)
Emergency Medicine Provider Triage Evaluation Note  Lonnie [name] , a 87 y.o. male  was evaluated in triage.  Pt complains of patient was sent to emergency department from dialysis for graft bleeding.  Patient denies any numbness, weakness, pain, color change.     Review of Systems  Positive: wound Negative: numbness, weakness, pain, color change.     Physical Exam  BP (!) 145/58 (BP Location: Right Arm)   Pulse 62   Temp 98 F (36.7 C) (Oral)   Resp 15   SpO2 100%  Gen:   Awake, no distress   Resp:  Normal effort  MSK:   Moves extremities without difficulty  Other:  Patient has palpable thrill to fistula in left arm.  When bandaging removed slow venous bleeding with noted to area above fistula.  Patient has sensation and range of motion intact distally.  Cap refill less than 3 seconds in all digits of left hand.  Medical Decision Making  Medically screening exam initiated at 5:13 PM.  Appropriate orders placed.  KELCY BAETEN was informed that the remainder of the evaluation will be completed by another provider, this initial triage assessment does not replace that evaluation, and the importance of remaining in the ED until their evaluation is complete.  Will obtain H&H.   The patient appears stable so that the remainder of the work up may be completed by another provider.      Loni Beckwith, PA-C 06/07/21 1715    Horton, Alvin Critchley, DO 06/08/21 0011

## 2021-06-08 ENCOUNTER — Other Ambulatory Visit: Payer: Self-pay

## 2021-06-08 DIAGNOSIS — N186 End stage renal disease: Secondary | ICD-10-CM

## 2021-06-08 LAB — BASIC METABOLIC PANEL
Anion gap: 8 (ref 5–15)
BUN: 34 mg/dL — ABNORMAL HIGH (ref 8–23)
CO2: 33 mmol/L — ABNORMAL HIGH (ref 22–32)
Calcium: 8.7 mg/dL — ABNORMAL LOW (ref 8.9–10.3)
Chloride: 94 mmol/L — ABNORMAL LOW (ref 98–111)
Creatinine, Ser: 3.29 mg/dL — ABNORMAL HIGH (ref 0.61–1.24)
GFR, Estimated: 17 mL/min — ABNORMAL LOW (ref >60)
Glucose, Bld: 98 mg/dL (ref 70–99)
Potassium: 4.2 mmol/L (ref 3.5–5.1)
Sodium: 135 mmol/L (ref 135–145)

## 2021-06-08 LAB — HEMOGLOBIN AND HEMATOCRIT, BLOOD
HCT: 33.2 % — ABNORMAL LOW (ref 39.0–52.0)
Hemoglobin: 10 g/dL — ABNORMAL LOW (ref 13.0–17.0)

## 2021-06-08 LAB — PROTIME-INR
INR: 1.1 (ref 0.8–1.2)
Prothrombin Time: 14.6 seconds (ref 11.4–15.2)

## 2021-06-08 NOTE — ED Provider Notes (Signed)
[facility] EMERGENCY DEPARTMENT Provider Note   CSN: [id number] Arrival date & time: 06/07/21  1546     History No chief complaint on file.   Lonnie [name] is a 87 y.o. male.  Patient sent from dialysis with bleeding of his fistula after dialysis session today at 3 PM.  He states he completed his dialysis session and there is bleeding after the needles were removed and he was referred to the hospital. He states the dialysis tech told him that he might have an aneurysm. He is does not think he takes any blood thinners but is not certain.  He is waited in the waiting room over 12 hours prior to my evaluation.  He denies any dizziness or lightheadedness.  No chest pain or shortness of breath.  He was on 4 L of oxygen chronically which is unchanged.  Denies any weakness or numbness to his arm.  No tingling. He does not think he takes any blood thinners.  The history is provided by the patient.      Past Medical History:  Diagnosis Date   Adenomatous polyps    Bursitis    left hip   Chronic kidney disease    Stage 4   Frequent urination at night    GERD (gastroesophageal reflux disease)    Hypertension    Hypothyroidism    Pneumonia    Polyclonal gammopathy determined by serum protein electrophoresis 10/04/2015    Patient Active Problem List   Diagnosis Date Noted   ESRD on dialysis ([place]) 08/24/2016   Monoclonal gammopathy 10/06/2015   Gammopathy, monoclonal    Polyclonal gammopathy determined by serum protein electrophoresis 10/04/2015   Depression 03/29/2015   Intertrigo 05/30/2014   Proteinuria 05/05/2014    Past Surgical History:  Procedure Laterality Date   AORTIC ARCH ANGIOGRAPHY N/A 08/01/2017   Procedure: Aortic Arch Angiography;  Surgeon: Conrad Ramblewood, MD;  Location: [facility];  Service: Cardiovascular;  Laterality: N/A;   AV FISTULA PLACEMENT Left 07/10/2016   Procedure: LEFT BRACHIOCEPHALIC ARTERIOVENOUS (AV) FISTULA CREATION;   Surgeon: Conrad Smyrna, MD;  Location: [facility];  Service: Vascular;  Laterality: Left;   COLONOSCOPY     RENAL BIOPSY Left 10/06/2015   REVISON OF ARTERIOVENOUS FISTULA Left 65/99/3570   Procedure: PLICATION OF LEFT BRACHIOCEPHALIC ARTERIOVENOUS FISTULA;  Surgeon: Conrad , MD;  Location: [facility];  Service: Vascular;  Laterality: Left;   SKIN SURGERY     back   UPPER EXTREMITY ANGIOGRAPHY Left 08/01/2017   Procedure: Upper Extremity Angiography;  Surgeon: Conrad , MD;  Location: [facility];  Service: Cardiovascular;  Laterality: Left;       Family History  Problem Relation Age of Onset   Diabetes Father    Diabetes Brother    Heart disease Brother    Colon cancer Neg Hx    Rectal cancer Neg Hx     Social History   Tobacco Use   Smoking status: Former    Pack years: 0.00    Types: Cigarettes    Quit date: 01/25/1980    Years since quitting: 41.3   Smokeless tobacco: Never  Vaping Use   Vaping Use: Never used  Substance Use Topics   Alcohol use: No    Comment: former alcoholic 32 years ago was last drink   Drug use: No    Home Medications Prior to Admission medications   Medication Sig Start Date End Date Taking? Authorizing Provider  acetaminophen (TYLENOL) 325 MG tablet Take 2 tablets (650 mg total) by mouth every 6 (six) hours as needed for mild pain. 01/26/21   White, Leitha Schuller, NP  amLODipine (NORVASC) 5 MG tablet Take 5 mg by mouth at bedtime.  03/23/14   [provider]  calcium acetate (PHOSLO) 667 MG capsule Take 1 capsule by mouth 3 (three) times daily with meals.  04/29/17   [provider]  Cyanocobalamin (VITAMIN B-12 PO) Take 1 tablet by mouth daily.    [provider]  diclofenac Sodium (VOLTAREN) 1 % GEL Apply 4 g topically 4 (four) times daily. 04/10/21   Lamptey, Myrene Galas, MD  diphenhydramine-acetaminophen (TYLENOL PM) 25-500 MG TABS tablet Take 1 tablet by mouth at bedtime as needed (sleep).    [provider]   escitalopram (LEXAPRO) 10 MG tablet Take 10 mg by mouth at bedtime.  06/14/16   [provider]  levothyroxine (SYNTHROID, LEVOTHROID) 88 MCG tablet Take 88 mcg by mouth at bedtime.    [provider]  lidocaine-prilocaine (EMLA) cream Apply 1 application topically daily as needed (port access).  02/06/17   [provider]  mirabegron ER (MYRBETRIQ) 50 MG TB24 tablet Take 50 mg by mouth at bedtime.     [provider]  multivitamin (RENA-VIT) TABS tablet Take 1 tablet by mouth daily.    [provider]  pantoprazole (PROTONIX) 20 MG tablet Take 1 tablet (20 mg total) by mouth daily. 04/10/21   Lamptey, Myrene Galas, MD  polyethylene glycol [facility]) packet Take 17 g by mouth daily. 12/30/18   Robyn Haber, MD  polyvinyl alcohol (LIQUIFILM TEARS) 1.4 % ophthalmic solution Place 1-2 drops into both eyes as needed for dry eyes.    [provider]    Allergies    Patient has no known allergies.  Review of Systems   Review of Systems  Constitutional:  Negative for activity change, appetite change and fever.  HENT:  Negative for congestion and rhinorrhea.   Respiratory:  Negative for cough, chest tightness and shortness of breath.   Cardiovascular:  Negative for chest pain.  Gastrointestinal:  Negative for abdominal pain, nausea and vomiting.  Genitourinary:  Negative for dysuria and hematuria.  Musculoskeletal:  Negative for arthralgias, back pain and myalgias.  Neurological:  Negative for dizziness, weakness, light-headedness and headaches.   all other systems are negative except as noted in the HPI and PMH.   Physical Exam Updated Vital Signs BP (!) 150/61   Pulse 60   Temp 98 F (36.7 C) (Oral)   Resp 17   SpO2 100%   Physical Exam Vitals and nursing note reviewed.  Constitutional:      General: He is not in acute distress.    Appearance: He is well-developed.  HENT:     Head: Normocephalic and atraumatic.     Mouth/Throat:      Pharynx: No oropharyngeal exudate.  Eyes:     Conjunctiva/sclera: Conjunctivae normal.     Pupils: Pupils are equal, round, and reactive to light.  Neck:     Comments: No meningismus. Cardiovascular:     Rate and Rhythm: Normal rate and regular rhythm.     Heart sounds: Normal heart sounds. No murmur heard. Pulmonary:     Effort: Pulmonary effort is normal. No respiratory distress.     Breath sounds: Normal breath sounds.  Abdominal:     Palpations: Abdomen is soft.     Tenderness: There is no abdominal tenderness. There is  no guarding or rebound.  Musculoskeletal:        General: No tenderness. Normal range of motion.     Cervical back: Normal range of motion and neck supple.     Comments: Left upper extremity dialysis fistula has thrill and bruit present.  There is no active bleeding on initial assessment.  Intact radial pulse and cardinal hand movements.  Skin:    General: Skin is warm.  Neurological:     Mental Status: He is alert and oriented to person, place, and time.     Cranial Nerves: No cranial nerve deficit.     Motor: No abnormal muscle tone.     Coordination: Coordination normal.     Comments: No ataxia on finger to nose bilaterally. No pronator drift. 5/5 strength throughout. CN 2-12 intact.Equal grip strength. Sensation intact.   Psychiatric:        Behavior: Behavior normal.    ED Results / Procedures / Treatments   Labs (all labs ordered are listed, but only abnormal results are displayed) Labs Reviewed  HEMOGLOBIN AND HEMATOCRIT, BLOOD - Abnormal; Notable for the following components:      Result Value   Hemoglobin 10.9 (*)    HCT 35.4 (*)    All other components within normal limits  HEMOGLOBIN AND HEMATOCRIT, BLOOD - Abnormal; Notable for the following components:   Hemoglobin 10.0 (*)    HCT 33.2 (*)    All other components within normal limits  BASIC METABOLIC PANEL - Abnormal; Notable for the following components:   Chloride 94 (*)    CO2 33 (*)     BUN 34 (*)    Creatinine, Ser 3.29 (*)    Calcium 8.7 (*)    GFR, Estimated 17 (*)    All other components within normal limits  PROTIME-INR    EKG None  Radiology No results found.  Procedures Procedures   Medications Ordered in ED Medications - No data to display  ED Course  I have reviewed the triage vital signs and the nursing notes.  Pertinent labs & imaging results that were available during my care of the patient were reviewed by me and considered in my medical decision making (see chart for details).    MDM Rules/Calculators/A&P                         Bleeding dialysis fistula, now resolved.  Vitals are stable.  Patient has waited 12 hours in the waiting room.  We will repeat hemoglobin.  Vitals remained stable.  Hemoglobin has downtrending slightly from 10.9-10.0. No indication for transfusion currently.   Observed in the ED without recurrence of bleeding from his dialysis fistula. Denies any weakness, numbness, chest pain, shortness of breath, dizziness or lightheadedness.  Dressing is placed.  Will refer to vascular surgery for follow-up.  Return precautions discussed Final Clinical Impression(s) / ED Diagnoses Final diagnoses:  Complication of arteriovenous dialysis fistula, initial encounter    Rx / DC Orders ED Discharge Orders     None        Derra Shartzer, Annie Main, MD 06/08/21 [id number]

## 2021-06-08 NOTE — Discharge Instructions (Addendum)
Call Dr. Mora Appl office today for an appointment to evaluate your fistula.  Return to the ED with recurrent bleeding or any other concerns.

## 2021-06-22 ENCOUNTER — Ambulatory Visit (INDEPENDENT_AMBULATORY_CARE_PROVIDER_SITE_OTHER): Payer: Medicare Other | Admitting: Vascular Surgery

## 2021-06-22 ENCOUNTER — Ambulatory Visit (HOSPITAL_COMMUNITY)
Admission: RE | Admit: 2021-06-22 | Discharge: 2021-06-22 | Disposition: A | Discharge status: Home / Self Care | Payer: PRIVATE HEALTH INSURANCE | Source: Ambulatory Visit | Attending: Vascular Surgery | Admitting: Vascular Surgery

## 2021-06-22 ENCOUNTER — Other Ambulatory Visit: Payer: Self-pay

## 2021-06-22 ENCOUNTER — Encounter: Payer: Self-pay | Admitting: Vascular Surgery

## 2021-06-22 VITALS — BP 169/67 | HR 61 | Temp 97.9°F | Resp 20 | Ht 67.0 in | Wt 154.0 lb

## 2021-06-22 DIAGNOSIS — N186 End stage renal disease: Secondary | ICD-10-CM

## 2021-06-22 DIAGNOSIS — Z992 Dependence on renal dialysis: Secondary | ICD-10-CM

## 2021-06-22 NOTE — Progress Notes (Signed)
Patient is a 87 year old male referred for evaluation of his left arm AV fistula.  He apparently had some bleeding from this recently.  He was seen in the ER on June 08, 2021.  At the time he was in the ER he had no further bleeding and was sent home and sent here for further evaluation.  Patient has never had an additional prior bleeding episode.  He does not recall any ulcers or scabs in the fistula.  He did have a previous plication of the fistula by Dr. Bridgett Larsson in 2017.  Past Medical History:  Diagnosis Date   Adenomatous polyps    Bursitis    left hip   Chronic kidney disease    Stage 4   Frequent urination at night    GERD (gastroesophageal reflux disease)    Hypertension    Hypothyroidism    Pneumonia    Polyclonal gammopathy determined by serum protein electrophoresis 10/04/2015    Past Surgical History:  Procedure Laterality Date   AORTIC ARCH ANGIOGRAPHY N/A 08/01/2017   Procedure: Aortic Arch Angiography;  Surgeon: Conrad Bruno, MD;  Location: [facility];  Service: Cardiovascular;  Laterality: N/A;   AV FISTULA PLACEMENT Left 07/10/2016   Procedure: LEFT BRACHIOCEPHALIC ARTERIOVENOUS (AV) FISTULA CREATION;  Surgeon: Conrad June Lake, MD;  Location: [facility];  Service: Vascular;  Laterality: Left;   COLONOSCOPY     RENAL BIOPSY Left 10/06/2015   REVISON OF ARTERIOVENOUS FISTULA Left 41/93/7902   Procedure: PLICATION OF LEFT BRACHIOCEPHALIC ARTERIOVENOUS FISTULA;  Surgeon: Conrad Gakona, MD;  Location: [facility];  Service: Vascular;  Laterality: Left;   SKIN SURGERY     back   UPPER EXTREMITY ANGIOGRAPHY Left 08/01/2017   Procedure: Upper Extremity Angiography;  Surgeon: Conrad Stroudsburg, MD;  Location: [facility];  Service: Cardiovascular;  Laterality: Left;    Current Outpatient Medications on File Prior to Visit  Medication Sig Dispense Refill   acetaminophen (TYLENOL) 325 MG tablet Take 2 tablets (650 mg total) by mouth every 6 (six) hours as needed for mild pain. 30 tablet 1    amLODipine (NORVASC) 5 MG tablet Take 5 mg by mouth at bedtime.      calcium acetate (PHOSLO) 667 MG capsule Take 1 capsule by mouth 3 (three) times daily with meals.   0   Cyanocobalamin (VITAMIN B-12 PO) Take 1 tablet by mouth daily.     diclofenac Sodium (VOLTAREN) 1 % GEL Apply 4 g topically 4 (four) times daily. 150 g 0   diphenhydramine-acetaminophen (TYLENOL PM) 25-500 MG TABS tablet Take 1 tablet by mouth at bedtime as needed (sleep).     escitalopram (LEXAPRO) 10 MG tablet Take 10 mg by mouth at bedtime.      levothyroxine (SYNTHROID, LEVOTHROID) 88 MCG tablet Take 88 mcg by mouth at bedtime.     lidocaine-prilocaine (EMLA) cream Apply 1 application topically daily as needed (port access).   0   mirabegron ER (MYRBETRIQ) 50 MG TB24 tablet Take 50 mg by mouth at bedtime.      multivitamin (RENA-VIT) TABS tablet Take 1 tablet by mouth daily.     pantoprazole (PROTONIX) 20 MG tablet Take 1 tablet (20 mg total) by mouth daily. 30 tablet 0   polyethylene glycol (MIRALAX) packet Take 17 g by mouth daily. 14 each 0   polyvinyl alcohol (LIQUIFILM TEARS) 1.4 % ophthalmic solution Place 1-2 drops into both eyes as needed for dry eyes.     No current  facility-administered medications on file prior to visit.    Physical exam:  Vitals:   06/22/21 1427  BP: (!) 169/67  Pulse: 61  Resp: 20  Temp: 97.9 F (36.6 C)  SpO2: 91%  Weight: 154 lb (69.9 kg)  Height: 5\' 7"  (1.702 m)    Left upper extremity has some aneurysmal degeneration but the skin is not thinned out and unable to easily pick this up off of the aneurysm.  There is no ulceration there is no dry eschar there is no thinning of the skin or ulcerated segment.  The fistula has a palpable thrill and is not pulsatile.  Assessment: Aneurysmal degeneration left arm AV fistula not currently at risk for bleeding.  Patient will follow up on an as-needed basis.  Ruta Hinds, MD [facility] Office:  [phone]

## 2021-07-21 ENCOUNTER — Other Ambulatory Visit: Payer: Self-pay

## 2021-07-21 DIAGNOSIS — M79671 Pain in right foot: Secondary | ICD-10-CM

## 2021-08-01 ENCOUNTER — Encounter (HOSPITAL_COMMUNITY): Payer: POS

## 2021-08-01 ENCOUNTER — Ambulatory Visit: Payer: POS

## 2021-08-02 ENCOUNTER — Emergency Department (HOSPITAL_BASED_OUTPATIENT_CLINIC_OR_DEPARTMENT_OTHER): Payer: Medicare Other | Admitting: Radiology

## 2021-08-02 ENCOUNTER — Other Ambulatory Visit: Payer: Self-pay

## 2021-08-02 ENCOUNTER — Inpatient Hospital Stay (HOSPITAL_BASED_OUTPATIENT_CLINIC_OR_DEPARTMENT_OTHER)
Admission: EM | Admit: 2021-08-02 | Discharge: 2021-08-05 | DRG: 947 | Disposition: A | Discharge status: Home Health | Payer: Medicare Other | Attending: Internal Medicine | Admitting: Internal Medicine

## 2021-08-02 ENCOUNTER — Emergency Department (HOSPITAL_BASED_OUTPATIENT_CLINIC_OR_DEPARTMENT_OTHER): Payer: Medicare Other

## 2021-08-02 ENCOUNTER — Encounter (HOSPITAL_BASED_OUTPATIENT_CLINIC_OR_DEPARTMENT_OTHER): Payer: Self-pay | Admitting: *Deleted

## 2021-08-02 DIAGNOSIS — Z87891 Personal history of nicotine dependence: Secondary | ICD-10-CM | POA: Diagnosis not present

## 2021-08-02 DIAGNOSIS — W19XXXA Unspecified fall, initial encounter: Secondary | ICD-10-CM

## 2021-08-02 DIAGNOSIS — Z79899 Other long term (current) drug therapy: Secondary | ICD-10-CM

## 2021-08-02 DIAGNOSIS — J449 Chronic obstructive pulmonary disease, unspecified: Secondary | ICD-10-CM | POA: Diagnosis present

## 2021-08-02 DIAGNOSIS — J9611 Chronic respiratory failure with hypoxia: Secondary | ICD-10-CM | POA: Diagnosis present

## 2021-08-02 DIAGNOSIS — Z20822 Contact with and (suspected) exposure to covid-19: Secondary | ICD-10-CM | POA: Diagnosis not present

## 2021-08-02 DIAGNOSIS — M898X9 Other specified disorders of bone, unspecified site: Secondary | ICD-10-CM | POA: Diagnosis present

## 2021-08-02 DIAGNOSIS — N186 End stage renal disease: Secondary | ICD-10-CM | POA: Diagnosis present

## 2021-08-02 DIAGNOSIS — I452 Bifascicular block: Secondary | ICD-10-CM | POA: Diagnosis not present

## 2021-08-02 DIAGNOSIS — E039 Hypothyroidism, unspecified: Secondary | ICD-10-CM | POA: Diagnosis not present

## 2021-08-02 DIAGNOSIS — I5082 Biventricular heart failure: Secondary | ICD-10-CM | POA: Diagnosis present

## 2021-08-02 DIAGNOSIS — R778 Other specified abnormalities of plasma proteins: Secondary | ICD-10-CM | POA: Diagnosis present

## 2021-08-02 DIAGNOSIS — Z9981 Dependence on supplemental oxygen: Secondary | ICD-10-CM | POA: Diagnosis not present

## 2021-08-02 DIAGNOSIS — N2581 Secondary hyperparathyroidism of renal origin: Secondary | ICD-10-CM | POA: Diagnosis present

## 2021-08-02 DIAGNOSIS — W1830XA Fall on same level, unspecified, initial encounter: Secondary | ICD-10-CM | POA: Diagnosis not present

## 2021-08-02 DIAGNOSIS — I083 Combined rheumatic disorders of mitral, aortic and tricuspid valves: Secondary | ICD-10-CM | POA: Diagnosis present

## 2021-08-02 DIAGNOSIS — S0101XA Laceration without foreign body of scalp, initial encounter: Secondary | ICD-10-CM | POA: Diagnosis not present

## 2021-08-02 DIAGNOSIS — Z66 Do not resuscitate: Secondary | ICD-10-CM | POA: Diagnosis not present

## 2021-08-02 DIAGNOSIS — I132 Hypertensive heart and chronic kidney disease with heart failure and with stage 5 chronic kidney disease, or end stage renal disease: Secondary | ICD-10-CM | POA: Diagnosis present

## 2021-08-02 DIAGNOSIS — R296 Repeated falls: Secondary | ICD-10-CM | POA: Diagnosis present

## 2021-08-02 DIAGNOSIS — K219 Gastro-esophageal reflux disease without esophagitis: Secondary | ICD-10-CM | POA: Diagnosis not present

## 2021-08-02 DIAGNOSIS — R531 Weakness: Secondary | ICD-10-CM | POA: Diagnosis not present

## 2021-08-02 DIAGNOSIS — J90 Pleural effusion, not elsewhere classified: Secondary | ICD-10-CM | POA: Diagnosis present

## 2021-08-02 DIAGNOSIS — I420 Dilated cardiomyopathy: Secondary | ICD-10-CM | POA: Diagnosis not present

## 2021-08-02 DIAGNOSIS — S0990XA Unspecified injury of head, initial encounter: Secondary | ICD-10-CM

## 2021-08-02 DIAGNOSIS — I4891 Unspecified atrial fibrillation: Secondary | ICD-10-CM | POA: Diagnosis present

## 2021-08-02 DIAGNOSIS — Z79890 Hormone replacement therapy (postmenopausal): Secondary | ICD-10-CM

## 2021-08-02 DIAGNOSIS — Z8249 Family history of ischemic heart disease and other diseases of the circulatory system: Secondary | ICD-10-CM

## 2021-08-02 DIAGNOSIS — Z992 Dependence on renal dialysis: Secondary | ICD-10-CM | POA: Diagnosis not present

## 2021-08-02 DIAGNOSIS — R5383 Other fatigue: Secondary | ICD-10-CM

## 2021-08-02 DIAGNOSIS — I10 Essential (primary) hypertension: Secondary | ICD-10-CM | POA: Diagnosis present

## 2021-08-02 DIAGNOSIS — I5042 Chronic combined systolic (congestive) and diastolic (congestive) heart failure: Secondary | ICD-10-CM | POA: Diagnosis present

## 2021-08-02 DIAGNOSIS — R7989 Other specified abnormal findings of blood chemistry: Secondary | ICD-10-CM | POA: Diagnosis present

## 2021-08-02 DIAGNOSIS — D631 Anemia in chronic kidney disease: Secondary | ICD-10-CM | POA: Diagnosis present

## 2021-08-02 LAB — BASIC METABOLIC PANEL
Anion gap: 12 (ref 5–15)
BUN: 53 mg/dL — ABNORMAL HIGH (ref 8–23)
CO2: 32 mmol/L (ref 22–32)
Calcium: 9.8 mg/dL (ref 8.9–10.3)
Chloride: 92 mmol/L — ABNORMAL LOW (ref 98–111)
Creatinine, Ser: 5.08 mg/dL — ABNORMAL HIGH (ref 0.61–1.24)
GFR, Estimated: 10 mL/min — ABNORMAL LOW (ref >60)
Glucose, Bld: 99 mg/dL (ref 70–99)
Potassium: 4.3 mmol/L (ref 3.5–5.1)
Sodium: 136 mmol/L (ref 135–145)

## 2021-08-02 LAB — CBC WITH DIFFERENTIAL/PLATELET
Abs Immature Granulocytes: 0.02 10*3/uL (ref 0.00–0.07)
Basophils Absolute: 0.1 10*3/uL (ref 0.0–0.1)
Basophils Relative: 1 %
Eosinophils Absolute: 0.4 10*3/uL (ref 0.0–0.5)
Eosinophils Relative: 6 %
HCT: 37.4 % — ABNORMAL LOW (ref 39.0–52.0)
Hemoglobin: 11.2 g/dL — ABNORMAL LOW (ref 13.0–17.0)
Immature Granulocytes: 0 %
Lymphocytes Relative: 10 %
Lymphs Abs: 0.6 10*3/uL — ABNORMAL LOW (ref 0.7–4.0)
MCH: 27.9 pg (ref 26.0–34.0)
MCHC: 29.9 g/dL — ABNORMAL LOW (ref 30.0–36.0)
MCV: 93.3 fL (ref 80.0–100.0)
Monocytes Absolute: 0.8 10*3/uL (ref 0.1–1.0)
Monocytes Relative: 13 %
Neutro Abs: 4.5 10*3/uL (ref 1.7–7.7)
Neutrophils Relative %: 70 %
Platelets: 259 10*3/uL (ref 150–400)
RBC: 4.01 MIL/uL — ABNORMAL LOW (ref 4.22–5.81)
RDW: 19.4 % — ABNORMAL HIGH (ref 11.5–15.5)
WBC: 6.4 10*3/uL (ref 4.0–10.5)
nRBC: 0 % (ref 0.0–0.2)

## 2021-08-02 LAB — RESP PANEL BY RT-PCR (FLU A&B, COVID) ARPGX2
Influenza A by PCR: NEGATIVE
Influenza B by PCR: NEGATIVE
SARS Coronavirus 2 by RT PCR: NEGATIVE

## 2021-08-02 LAB — TROPONIN I (HIGH SENSITIVITY)
Troponin I (High Sensitivity): 279 ng/L (ref <18)
Troponin I (High Sensitivity): 258 ng/L (ref <18)

## 2021-08-02 LAB — BRAIN NATRIURETIC PEPTIDE: B Natriuretic Peptide: >4500 pg/mL — ABNORMAL HIGH (ref 0.0–100.0)

## 2021-08-02 MED ORDER — SENNOSIDES-DOCUSATE SODIUM 8.6-50 MG PO TABS: 1.0000 | ORAL_TABLET | Freq: Every evening | ORAL | Status: DC | PRN

## 2021-08-02 MED ORDER — ACETAMINOPHEN 325 MG PO TABS: 650.0000 mg | ORAL_TABLET | Freq: Four times a day (QID) | ORAL | Status: DC | PRN

## 2021-08-02 MED ORDER — MIRABEGRON ER 50 MG PO TB24
50.0000 mg | ORAL_TABLET | Freq: Every day | ORAL | Status: DC
  Administered 2021-08-02 – 2021-08-04 (×3): 50 mg via ORAL
  Filled 2021-08-02 (×4): qty 1

## 2021-08-02 MED ORDER — AMLODIPINE BESYLATE 5 MG PO TABS
5.0000 mg | ORAL_TABLET | Freq: Every day | ORAL | Status: DC
  Administered 2021-08-02: 5 mg via ORAL
  Filled 2021-08-02: qty 1

## 2021-08-02 MED ORDER — ESCITALOPRAM OXALATE 10 MG PO TABS
10.0000 mg | ORAL_TABLET | Freq: Every day | ORAL | Status: DC
  Administered 2021-08-02 – 2021-08-04 (×3): 10 mg via ORAL
  Filled 2021-08-02 (×3): qty 1

## 2021-08-02 MED ORDER — ALBUTEROL SULFATE HFA 108 (90 BASE) MCG/ACT IN AERS: 1.0000 | INHALATION_SPRAY | Freq: Four times a day (QID) | RESPIRATORY_TRACT | Status: DC | PRN

## 2021-08-02 MED ORDER — ALBUTEROL SULFATE (2.5 MG/3ML) 0.083% IN NEBU: 2.5000 mg | INHALATION_SOLUTION | Freq: Four times a day (QID) | RESPIRATORY_TRACT | Status: DC | PRN

## 2021-08-02 MED ORDER — HEPARIN SODIUM (PORCINE) 5000 UNIT/ML IJ SOLN
5000.0000 [IU] | Freq: Three times a day (TID) | INTRAMUSCULAR | Status: DC
  Administered 2021-08-03 – 2021-08-05 (×7): 5000 [IU] via SUBCUTANEOUS
  Filled 2021-08-02 (×7): qty 1

## 2021-08-02 MED ORDER — SODIUM CHLORIDE 0.9% FLUSH
3.0000 mL | Freq: Two times a day (BID) | INTRAVENOUS | Status: DC
  Administered 2021-08-02 – 2021-08-05 (×5): 3 mL via INTRAVENOUS

## 2021-08-02 MED ORDER — ONDANSETRON HCL 4 MG/2ML IJ SOLN: 4.0000 mg | Freq: Four times a day (QID) | INTRAMUSCULAR | Status: DC | PRN

## 2021-08-02 MED ORDER — ONDANSETRON HCL 4 MG PO TABS: 4.0000 mg | ORAL_TABLET | Freq: Four times a day (QID) | ORAL | Status: DC | PRN

## 2021-08-02 MED ORDER — POLYETHYLENE GLYCOL 3350 17 G PO PACK
17.0000 g | PACK | Freq: Every day | ORAL | Status: DC
  Administered 2021-08-03 – 2021-08-05 (×3): 17 g via ORAL
  Filled 2021-08-02 (×3): qty 1

## 2021-08-02 MED ORDER — LEVOTHYROXINE SODIUM 88 MCG PO TABS
88.0000 ug | ORAL_TABLET | Freq: Every day | ORAL | Status: DC
  Administered 2021-08-03: 88 ug via ORAL
  Filled 2021-08-02: qty 1

## 2021-08-02 MED ORDER — PANTOPRAZOLE SODIUM 20 MG PO TBEC
20.0000 mg | DELAYED_RELEASE_TABLET | Freq: Every day | ORAL | Status: DC
  Administered 2021-08-03 – 2021-08-05 (×3): 20 mg via ORAL
  Filled 2021-08-02 (×3): qty 1

## 2021-08-02 MED ORDER — ORAL CARE MOUTH RINSE
15.0000 mL | Freq: Two times a day (BID) | OROMUCOSAL | Status: DC
  Administered 2021-08-02 – 2021-08-05 (×5): 15 mL via OROMUCOSAL

## 2021-08-02 MED ORDER — CALCIUM ACETATE (PHOS BINDER) 667 MG PO CAPS
667.0000 mg | ORAL_CAPSULE | Freq: Three times a day (TID) | ORAL | Status: DC
  Administered 2021-08-03 – 2021-08-05 (×7): 667 mg via ORAL
  Filled 2021-08-02 (×7): qty 1

## 2021-08-02 MED ORDER — ACETAMINOPHEN 650 MG RE SUPP: 650.0000 mg | Freq: Four times a day (QID) | RECTAL | Status: DC | PRN

## 2021-08-02 MED ORDER — POLYVINYL ALCOHOL 1.4 % OP SOLN
1.0000 [drp] | OPHTHALMIC | Status: DC | PRN
  Filled 2021-08-02: qty 15

## 2021-08-02 NOTE — ED Triage Notes (Signed)
Patient fell last Monday and stated that he fell backward and loss of consciousness per pt.  Was advised by his PCP to come here to be checked.

## 2021-08-02 NOTE — Progress Notes (Signed)
Pt arrived to floor around 1720 via stretcher, transported by [facility]  [facility]. Pt alert and oriented x4 upon arrival in no acute distress. VSS. Respirations even and unlabored on 2 L/min O2. Skin assessment completed with Magdalen Spatz. Assessment completed. Mepilex applied to sacrum and bilateral heels due to redness (blanchable). Pt's belongings checked in and documented. Pt oriented to room and use of call bell. Pt encouraged to use call bell for assistance. Call bell within reach. Bed in low position.

## 2021-08-02 NOTE — ED Notes (Signed)
Pt having difficulty having bowel movement.  Pt disimpacted for several hard stool balls.

## 2021-08-02 NOTE — H&P (Signed)
History and Physical    HARREL FERRONE XFG:[id number] DOB: Jul 06, 1925 DOA: 08/02/2021  PCP: Prince Solian, MD  Patient coming from: [place]  I have personally briefly reviewed patient's old medical records in [place]  Chief Complaint: Fatigue, fall  HPI: Lonnie [name] is a 87 y.o. male with medical history significant for ESRD on MWF HD, COPD, chronic respiratory failure with hypoxia on 4 L supplemental O2 via [place], HTN, hypothyroidism who presented to the ED for evaluation of fatigue and recent fall at home.  Patient states that he has been feeling generally weak and fatigued recently.  2 days ago he says it took about 15 minutes to get out of the car with assistance from family.  While walking towards his home he became weak his legs and fell to the ground.  He did hit the back of his head but did not lose consciousness.  He says he normally ambulates with the use of a walker but the walker was in the trunk of the car at that time.  Currently lives alone but states that two of his nephews alternate time staying at his home to help care for him.  He did not experience any lightheadedness/dizziness, chest pain, dyspnea, cough, nausea, vomiting, abdominal pain.  Reports attending dialysis last on 07/31/21.  He does not make urine.  [place] ED Course:  Initial vitals showed BP 108/52, pulse 76, RR 18, temp 98.4 F, SPO2 97% on 2 L supplemental O2 via [place].  Labs show WBC 6.4, hemoglobin 11.2, platelets 259,000, sodium 136, potassium 4.3, bicarb 32, BUN 53, creatinine 5.08, serum glucose 99, high-sensitivity troponin I 279 > 258, BNP >4500.  SARS-CoV-2 PCR and influenza negative.  CT head without contrast was negative for acute intracranial findings.  Soft tissue swelling with ill-defined hematoma in the parietal occipital scalp seen without underlying fracture.  2 view chest x-ray showed moderate right pleural effusion and small left effusion.  The  hospitalist service was consulted to admit for further evaluation and management.  Review of Systems: All systems reviewed and are negative except as documented in history of present illness above.   Past Medical History:  Diagnosis Date   Adenomatous polyps    Bursitis    left hip   Chronic kidney disease    Stage 4   Frequent urination at night    GERD (gastroesophageal reflux disease)    Hypertension    Hypothyroidism    Pneumonia    Polyclonal gammopathy determined by serum protein electrophoresis 10/04/2015    Past Surgical History:  Procedure Laterality Date   AORTIC ARCH ANGIOGRAPHY N/A 08/01/2017   Procedure: Aortic Arch Angiography;  Surgeon: Conrad McDade, MD;  Location: [facility];  Service: Cardiovascular;  Laterality: N/A;   AV FISTULA PLACEMENT Left 07/10/2016   Procedure: LEFT BRACHIOCEPHALIC ARTERIOVENOUS (AV) FISTULA CREATION;  Surgeon: Conrad Clyde Hill, MD;  Location: [facility];  Service: Vascular;  Laterality: Left;   COLONOSCOPY     RENAL BIOPSY Left 10/06/2015   REVISON OF ARTERIOVENOUS FISTULA Left 96/78/9381   Procedure: PLICATION OF LEFT BRACHIOCEPHALIC ARTERIOVENOUS FISTULA;  Surgeon: Conrad Avon, MD;  Location: [facility];  Service: Vascular;  Laterality: Left;   SKIN SURGERY     back   UPPER EXTREMITY ANGIOGRAPHY Left 08/01/2017   Procedure: Upper Extremity Angiography;  Surgeon: Conrad Carlock, MD;  Location: [facility];  Service: Cardiovascular;  Laterality: Left;    Social History:  reports that he quit smoking about 41 years ago. He has never used smokeless tobacco. He reports that he does not drink alcohol and does not use drugs.  No Known Allergies  Family History  Problem Relation Age of Onset   Diabetes Father    Diabetes Brother    Heart disease Brother    Colon cancer Neg Hx    Rectal cancer Neg Hx      Prior to Admission medications   Medication Sig Start Date End Date Taking? Authorizing Provider  amLODipine (NORVASC) 5 MG  tablet Take 5 mg by mouth at bedtime.  03/23/14  Yes [provider]  calcium acetate (PHOSLO) 667 MG capsule Take 1 capsule by mouth 3 (three) times daily with meals.  04/29/17  Yes [provider]  Cyanocobalamin (VITAMIN B-12 PO) Take 1 tablet by mouth daily.   Yes [provider]  escitalopram (LEXAPRO) 10 MG tablet Take 10 mg by mouth at bedtime.  06/14/16  Yes [provider]  levothyroxine (SYNTHROID, LEVOTHROID) 88 MCG tablet Take 88 mcg by mouth at bedtime.   Yes [provider]  mirabegron ER (MYRBETRIQ) 50 MG TB24 tablet Take 50 mg by mouth at bedtime.    Yes [provider]  multivitamin (RENA-VIT) TABS tablet Take 1 tablet by mouth daily.   Yes [provider]  pantoprazole (PROTONIX) 20 MG tablet Take 1 tablet (20 mg total) by mouth daily. 04/10/21  Yes Lamptey, Myrene Galas, MD  acetaminophen (TYLENOL) 325 MG tablet Take 2 tablets (650 mg total) by mouth every 6 (six) hours as needed for mild pain. 01/26/21   Hans Eden, NP  diclofenac Sodium (VOLTAREN) 1 % GEL Apply 4 g topically 4 (four) times daily. 04/10/21   Lamptey, Myrene Galas, MD  diphenhydramine-acetaminophen (TYLENOL PM) 25-500 MG TABS tablet Take 1 tablet by mouth at bedtime as needed (sleep).    [provider]  lidocaine-prilocaine (EMLA) cream Apply 1 application topically daily as needed (port access).  02/06/17   [provider]  polyethylene glycol (MIRALAX) packet Take 17 g by mouth daily. 12/30/18   Robyn Haber, MD  polyvinyl alcohol (LIQUIFILM TEARS) 1.4 % ophthalmic solution Place 1-2 drops into both eyes as needed for dry eyes.    [provider]    Physical Exam: Vitals:   08/02/21 1300 08/02/21 1400 08/02/21 1430 08/02/21 1728  BP: (!) 114/50 (!) 115/51 131/77 (!) 142/67  Pulse: (!) 54 (!) 33 65 63  Resp: (!) 21 19 17 20   Temp:    98.6 F (37 C)  TempSrc:    Oral  SpO2: 99% 100% 95% 94%  Weight:    65.2 kg  Height:     5\' 8"  (1.727 m)   Constitutional: Elderly man resting supine in bed, NAD, calm, comfortable Eyes: PERRL, lids and conjunctivae normal ENMT: Mucous membranes are moist. Posterior pharynx clear of any exudate or lesions.Normal dentition.  Neck: normal, supple, no masses. Respiratory: clear to auscultation bilaterally, no wheezing, no crackles. Normal respiratory effort. No accessory muscle use.  Cardiovascular: Regular rate and rhythm, no murmurs / rubs / gallops. No extremity edema. 2+ pedal pulses.  LUE aVF with palpable thrill and aneurysmal change Abdomen: no tenderness, no masses palpated. No hepatosplenomegaly. Bowel sounds positive.  Musculoskeletal: no clubbing / cyanosis. No joint deformity upper and lower extremities. Good ROM, no contractures. Normal muscle tone.  Skin: Abrasion at left posterior occipital region without open wound. neurologic: CN 2-12 grossly intact. Sensation  intact. Strength 5/5 in all 4.  Psychiatric: Normal judgment and insight. Alert and oriented x 3. Normal mood.     Labs on Admission: I have personally reviewed following labs and imaging studies  CBC: Recent Labs  Lab 08/02/21 1134  WBC 6.4  NEUTROABS 4.5  HGB 11.2*  HCT 37.4*  MCV 93.3  PLT 376   Basic Metabolic Panel: Recent Labs  Lab 08/02/21 1134  NA 136  K 4.3  CL 92*  CO2 32  GLUCOSE 99  BUN 53*  CREATININE 5.08*  CALCIUM 9.8   GFR: Estimated Creatinine Clearance: 8.2 mL/min (A) (by C-G formula based on SCr of 5.08 mg/dL (H)). Liver Function Tests: No results for input(s): AST, ALT, ALKPHOS, BILITOT, PROT, ALBUMIN in the last 168 hours. No results for input(s): LIPASE, AMYLASE in the last 168 hours. No results for input(s): AMMONIA in the last 168 hours. Coagulation Profile: No results for input(s): INR, PROTIME in the last 168 hours. Cardiac Enzymes: No results for input(s): CKTOTAL, CKMB, CKMBINDEX, TROPONINI in the last 168 hours. BNP (last 3 results) No results for  input(s): PROBNP in the last 8760 hours. HbA1C: No results for input(s): HGBA1C in the last 72 hours. CBG: No results for input(s): GLUCAP in the last 168 hours. Lipid Profile: No results for input(s): CHOL, HDL, LDLCALC, TRIG, CHOLHDL, LDLDIRECT in the last 72 hours. Thyroid Function Tests: No results for input(s): TSH, T4TOTAL, FREET4, T3FREE, THYROIDAB in the last 72 hours. Anemia Panel: No results for input(s): VITAMINB12, FOLATE, FERRITIN, TIBC, IRON, RETICCTPCT in the last 72 hours. Urine analysis:    Component Value Date/Time   COLORURINE YELLOW 03/19/2016 1827   APPEARANCEUR CLEAR 03/19/2016 1827   LABSPEC 1.020 11/02/2020 1826   PHURINE 8.5 (H) 11/02/2020 1826   GLUCOSEU NEGATIVE 11/02/2020 1826   HGBUR TRACE (A) 11/02/2020 1826   BILIRUBINUR NEGATIVE 11/02/2020 1826   KETONESUR NEGATIVE 11/02/2020 1826   PROTEINUR >=300 (A) 11/02/2020 1826   UROBILINOGEN 0.2 11/02/2020 1826   NITRITE NEGATIVE 11/02/2020 1826   LEUKOCYTESUR NEGATIVE 11/02/2020 1826    Radiological Exams on Admission: DG Chest 2 View  Result Date: 08/02/2021 CLINICAL DATA:  Fall.  Backwards fall EXAM: CHEST - 2 VIEW COMPARISON:  3242 FINDINGS: Twenty normal cardiac silhouette. There is a moderate RIGHT effusion. Small LEFT effusion. No pneumothorax. No fracture. IMPRESSION: 1. Bilateral effusions.  Moderate RIGHT effusion. 2. No pneumothorax or fracture identified. Electronically Signed   By: Suzy Bouchard M.D.   On: 08/02/2021 12:41   CT HEAD WO CONTRAST (5MM)  Result Date: 08/02/2021 CLINICAL DATA:  Head trauma, minor (Age >= 65y) posterior head injury, fall, 3 days ago EXAM: CT HEAD WITHOUT CONTRAST TECHNIQUE: Contiguous axial images were obtained from the base of the skull through the vertex without intravenous contrast. COMPARISON:  11/12/2012 FINDINGS: Brain: No evidence of acute infarction, hemorrhage, hydrocephalus, extra-axial collection or mass lesion/mass effect. Scattered low-density changes  within the periventricular and subcortical white matter compatible with chronic microvascular ischemic change. Mild diffuse cerebral volume loss. Vascular: Atherosclerotic calcifications involving the large vessels of the skull base. No unexpected hyperdense vessel. Skull: Normal. Negative for fracture or focal lesion. Sinuses/Orbits: No acute finding. Other: Soft tissue swelling with ill-defined hematoma in the parieto-occipital scalp. IMPRESSION: 1. No acute intracranial findings. 2. Soft tissue swelling with ill-defined hematoma in the parieto-occipital scalp. No underlying calvarial fracture. 3. Chronic microvascular ischemic change and cerebral volume loss. Electronically Signed   By: Davina Poke D.O.   On: 08/02/2021  12:29    EKG: Personally reviewed.  Bifascicular block, PVCs.  Similar to prior.  Assessment/Plan Principal Problem:   Generalized weakness Active Problems:   ESRD on dialysis ([place])   Hypothyroidism   Hypertension   Elevated troponin   JOEPH SZATKOWSKI is a 87 y.o. male with medical history significant for ESRD on MWF HD, COPD, chronic respiratory failure with hypoxia on 4 L supplemental O2 via [place], HTN, hypothyroidism who is admitted with generalized weakness.  Generalized weakness/fall at home: Reports mechanical fall at home.  Did hit his head without loss of consciousness.  CT head without evidence of acute intracranial abnormality. -PT/OT eval  Elevated troponin/bilateral pleural effusions: Mild troponin elevation 279, downtrending on repeat.  Denies any recent chest pain.  EKG shows bifascicular block, similar to prior.  Small to moderate pleural effusion seen on CXR in the setting of ESRD.  Currently without any respiratory symptoms. -Obtain echocardiogram -Monitor on telemetry -Volume control with dialysis  ESRD on MWF HD: Reports last HD 8/29.  Will need nephrology consult in AM for further dialysis needs.  Currently no emergent need for HD  tonight.  COPD/chronic respiratory failure with hypoxia: Chronic and stable.  Continue supplemental oxygen and albuterol as needed.  Hypertension: Continue amlodipine.  Hypothyroidism: Continue Synthroid.  DVT prophylaxis: Subcutaneous heparin Code Status: DNR, confirmed with patient on admission Family Communication: Discussed with patient, he has discussed with family Disposition Plan: From home, dispo pending clinical progress Consults called: None Level of care: Telemetry Cardiac Admission status:  Status is: Observation  The patient remains OBS appropriate and will d/c before 2 midnights.  Dispo: The patient is from: Home              Anticipated d/c is to: Home              Patient currently is not medically stable to d/c.   Zada Finders MD [facility]  If 7PM-7AM, please contact night-coverage [email or web address]  08/02/2021, 7:05 PM

## 2021-08-02 NOTE — ED Notes (Signed)
Pt unable to stand on his own. Pt is a 2 person assist for standing and walking. Tech & Respitory assisted Pt into the bed.

## 2021-08-02 NOTE — Progress Notes (Signed)
Received a phone call from Facility: [facility]  Requesting MD: [place] Patient with h/o ESRD on MWF HD; COPD on 4L home O2; HTN; and hypothyroidism presenting with a fall.  Nonspecific fatigue x 1 week.  At baseline, can walk with walker but no energy for 7 days, fall earlier in the week.  Compliant with HD.  Work-up shows CHF and R pleural effusion, moderate.  Likely needs echo, ?thoracentesis.  On home O2.  Too weak to function independently.   Plan of care: Needs evaluation/monitoring at [facility] on tele.  Needs echo and possible thoracentesis as well as GOC discussion and PT/OT. The patient will be accepted for observation on telemetry at [facility] when bed is available.    Nursing staff, Please call the [place] number at the top of Amion at the time of the patient's arrival so that the patient can be paged to the admitting physician.   Carlyon Shadow, M.D. [facility]

## 2021-08-02 NOTE — ED Notes (Signed)
Report called to [place], RN, 5W at [facility].

## 2021-08-02 NOTE — ED Notes (Signed)
RT Note: Pt. placed on wall Oxygen @2  lpm via own n/c from [facility] wearing upon arrival from home, no COPD/Bronchodilator use per pt., RT to monitor.

## 2021-08-02 NOTE — ED Notes (Signed)
Family at bedside. 

## 2021-08-02 NOTE — ED Notes (Signed)
Patient unable to void at this time

## 2021-08-02 NOTE — ED Notes (Signed)
CRITICAL VALUE STICKER  CRITICAL VALUE:Troponin 412  [id number] (on-site recipient of call):Shawnie Pons, RN  DATE & TIME NOTIFIED: 08-02-21 1422  MESSENGER (representative from lab):  MD NOTIFIED: Dr. Langston Masker  TIME OF NOTIFICATION:1423  RESPONSE:

## 2021-08-02 NOTE — ED Provider Notes (Signed)
[facility] EMERGENCY DEPT Provider Note   CSN: [id number] Arrival date & time: 08/02/21  1102     History Chief Complaint  Patient presents with   Lonnie [name] is a 87 y.o. male with history of end-stage renal disease on Monday Wednesday Friday dialysis, COPD/emphysema on baseline oxygen 4L, presenting from home with concern for fatigue and a fall.  Supplemental history is provided by the patient's nephew at bedside.  The patient reports that he lives independently, but has family members to check in on him.  Reports patient feeling excessively fatigued the past several days, like he has leg blocks in his feet.  He reports he tried to stand up 3 days ago and fell backwards.  He does not recall any other details around this.  He struck the back of his head on the ground.  No loss of consciousness.  Is not on blood thinners.  He denies any headache.  He comes to the ED today because he had discussed this with his PCP who advised that he come for CT scan of the head.  He does not feel as reported he has been more fatigued than normal, very low energy.  At baseline he walks with a walker.  No fevers or chills.  No chest pain.  He reports he follows at the [facility].  HPI     Past Medical History:  Diagnosis Date   Adenomatous polyps    Bursitis    left hip   Chronic kidney disease    Stage 4   Frequent urination at night    GERD (gastroesophageal reflux disease)    Hypertension    Hypothyroidism    Pneumonia    Polyclonal gammopathy determined by serum protein electrophoresis 10/04/2015    Patient Active Problem List   Diagnosis Date Noted   Generalized weakness 08/02/2021   ESRD on dialysis ([place]) 08/24/2016   Monoclonal gammopathy 10/06/2015   Gammopathy, monoclonal    Polyclonal gammopathy determined by serum protein electrophoresis 10/04/2015   Depression 03/29/2015   Intertrigo 05/30/2014   Proteinuria 05/05/2014    Past Surgical  History:  Procedure Laterality Date   AORTIC ARCH ANGIOGRAPHY N/A 08/01/2017   Procedure: Aortic Arch Angiography;  Surgeon: Conrad [place], MD;  Location: [place];  Service: Cardiovascular;  Laterality: N/A;   AV FISTULA PLACEMENT Left 07/10/2016   Procedure: LEFT BRACHIOCEPHALIC ARTERIOVENOUS (AV) FISTULA CREATION;  Surgeon: Conrad [place], MD;  Location: [facility];  Service: Vascular;  Laterality: Left;   COLONOSCOPY     RENAL BIOPSY Left 10/06/2015   REVISON OF ARTERIOVENOUS FISTULA Left 32/20/2542   Procedure: PLICATION OF LEFT BRACHIOCEPHALIC ARTERIOVENOUS FISTULA;  Surgeon: Conrad [place], MD;  Location: [facility];  Service: Vascular;  Laterality: Left;   SKIN SURGERY     back   UPPER EXTREMITY ANGIOGRAPHY Left 08/01/2017   Procedure: Upper Extremity Angiography;  Surgeon: Conrad , MD;  Location: [facility];  Service: Cardiovascular;  Laterality: Left;       Family History  Problem Relation Age of Onset   Diabetes Father    Diabetes Brother    Heart disease Brother    Colon cancer Neg Hx    Rectal cancer Neg Hx     Social History   Tobacco Use   Smoking status: Former    Types: Cigarettes    Quit date: 01/25/1980    Years since quitting: 41.5   Smokeless tobacco: Never  Vaping Use   Vaping Use: Never used  Substance Use Topics   Alcohol use: No    Comment: former alcoholic 32 years ago was last drink   Drug use: No    Home Medications Prior to Admission medications   Medication Sig Start Date End Date Taking? Authorizing Provider  amLODipine (NORVASC) 5 MG tablet Take 5 mg by mouth at bedtime.  03/23/14  Yes [provider]  calcium acetate (PHOSLO) 667 MG capsule Take 1 capsule by mouth 3 (three) times daily with meals.  04/29/17  Yes [provider]  Cyanocobalamin (VITAMIN B-12 PO) Take 1 tablet by mouth daily.   Yes [provider]  escitalopram (LEXAPRO) 10 MG tablet Take 10 mg by mouth at bedtime.  06/14/16  Yes [provider]  levothyroxine (SYNTHROID, LEVOTHROID) 88 MCG tablet Take 88 mcg by mouth at bedtime.   Yes [provider]  mirabegron ER (MYRBETRIQ) 50 MG TB24 tablet Take 50 mg by mouth at bedtime.    Yes [provider]  multivitamin (RENA-VIT) TABS tablet Take 1 tablet by mouth daily.   Yes [provider]  pantoprazole (PROTONIX) 20 MG tablet Take 1 tablet (20 mg total) by mouth daily. 04/10/21  Yes Lamptey, Myrene Galas, MD  acetaminophen (TYLENOL) 325 MG tablet Take 2 tablets (650 mg total) by mouth every 6 (six) hours as needed for mild pain. 01/26/21   Hans Eden, NP  diclofenac Sodium (VOLTAREN) 1 % GEL Apply 4 g topically 4 (four) times daily. 04/10/21   Lamptey, Myrene Galas, MD  diphenhydramine-acetaminophen (TYLENOL PM) 25-500 MG TABS tablet Take 1 tablet by mouth at bedtime as needed (sleep).    [provider]  lidocaine-prilocaine (EMLA) cream Apply 1 application topically daily as needed (port access).  02/06/17   [provider]  polyethylene glycol (MIRALAX) packet Take 17 g by mouth daily. 12/30/18   Robyn Haber, MD  polyvinyl alcohol (LIQUIFILM TEARS) 1.4 % ophthalmic solution Place 1-2 drops into both eyes as needed for dry eyes.    [provider]    Allergies    Patient has no known allergies.  Review of Systems   Review of Systems  Constitutional:  Positive for fatigue. Negative for chills and fever.  HENT:  Negative for ear pain and sore throat.   Eyes:  Negative for pain and visual disturbance.  Respiratory:  Negative for cough and shortness of breath.   Cardiovascular:  Negative for chest pain and palpitations.  Gastrointestinal:  Negative for abdominal pain and vomiting.  Genitourinary:  Negative for dysuria and hematuria.  Musculoskeletal:  Negative for arthralgias and back pain.  Skin:  Negative for color change and rash.  Neurological:  Negative for syncope, facial asymmetry, light-headedness and headaches.   All other systems reviewed and are negative.  Physical Exam Updated Vital Signs BP 131/77   Pulse 65   Temp 98.4 F (36.9 C)   Resp 17   Ht 5\' 7"  (1.702 m)   Wt 69.9 kg   SpO2 95%   BMI 24.14 kg/m   Physical Exam Constitutional:      General: He is not in acute distress. HENT:     Head: Normocephalic. No raccoon eyes or Battle's sign.     Comments: Contusion, dried blood to left occiptal scalp Eyes:     Conjunctiva/sclera: Conjunctivae normal.     Pupils: Pupils are equal, round, and reactive to light.  Cardiovascular:     Rate and Rhythm:  Normal rate and regular rhythm.  Pulmonary:     Effort: Pulmonary effort is normal. No respiratory distress.     Comments: On baseline home O2 Abdominal:     General: There is no distension.     Tenderness: There is no abdominal tenderness.  Musculoskeletal:        General: No swelling, tenderness or deformity.  Skin:    General: Skin is warm and dry.  Neurological:     General: No focal deficit present.     Mental Status: He is alert. Mental status is at baseline.  Psychiatric:        Mood and Affect: Mood normal.        Behavior: Behavior normal.    ED Results / Procedures / Treatments   Labs (all labs ordered are listed, but only abnormal results are displayed) Labs Reviewed  BASIC METABOLIC PANEL - Abnormal; Notable for the following components:      Result Value   Chloride 92 (*)    BUN 53 (*)    Creatinine, Ser 5.08 (*)    GFR, Estimated 10 (*)    All other components within normal limits  CBC WITH DIFFERENTIAL/PLATELET - Abnormal; Notable for the following components:   RBC 4.01 (*)    Hemoglobin 11.2 (*)    HCT 37.4 (*)    MCHC 29.9 (*)    RDW 19.4 (*)    Lymphs Abs 0.6 (*)    All other components within normal limits  BRAIN NATRIURETIC PEPTIDE - Abnormal; Notable for the following components:   B Natriuretic Peptide >4,500.0 (*)    All other components within normal limits  TROPONIN I (HIGH SENSITIVITY) -  Abnormal; Notable for the following components:   Troponin I (High Sensitivity) 279 (*)    All other components within normal limits  TROPONIN I (HIGH SENSITIVITY) - Abnormal; Notable for the following components:   Troponin I (High Sensitivity) 258 (*)    All other components within normal limits  RESP PANEL BY RT-PCR (FLU A&B, COVID) [id number]    EKG  Radiology DG Chest 2 View  Result Date: 08/02/2021 CLINICAL DATA:  Fall.  Backwards fall EXAM: CHEST - 2 VIEW COMPARISON:  3242 FINDINGS: Twenty normal cardiac silhouette. There is a moderate RIGHT effusion. Small LEFT effusion. No pneumothorax. No fracture. IMPRESSION: 1. Bilateral effusions.  Moderate RIGHT effusion. 2. No pneumothorax or fracture identified. Electronically Signed   By: Suzy Bouchard M.D.   On: 08/02/2021 12:41   CT HEAD WO CONTRAST (5MM)  Result Date: 08/02/2021 CLINICAL DATA:  Head trauma, minor (Age >= 65y) posterior head injury, fall, 3 days ago EXAM: CT HEAD WITHOUT CONTRAST TECHNIQUE: Contiguous axial images were obtained from the base of the skull through the vertex without intravenous contrast. COMPARISON:  11/12/2012 FINDINGS: Brain: No evidence of acute infarction, hemorrhage, hydrocephalus, extra-axial collection or mass lesion/mass effect. Scattered low-density changes within the periventricular and subcortical white matter compatible with chronic microvascular ischemic change. Mild diffuse cerebral volume loss. Vascular: Atherosclerotic calcifications involving the large vessels of the skull base. No unexpected hyperdense vessel. Skull: Normal. Negative for fracture or focal lesion. Sinuses/Orbits: No acute finding. Other: Soft tissue swelling with ill-defined hematoma in the parieto-occipital scalp. IMPRESSION: 1. No acute intracranial findings. 2. Soft tissue swelling with ill-defined hematoma in the parieto-occipital scalp. No underlying calvarial fracture. 3. Chronic microvascular ischemic change and cerebral  volume loss. Electronically Signed   By: Davina Poke D.O.   On: 08/02/2021 12:29  Procedures Procedures   Medications Ordered in ED Medications - No data to display  ED Course  I have reviewed the triage vital signs and the nursing notes.  Pertinent labs & imaging results that were available during my care of the patient were reviewed by me and considered in my medical decision making (see chart for details).  CT scan of the brain for trauma evaluation.  There is no focal deficits to suggest that this is a stroke.  He is not on blood thinners.  No evidence of spinal fracture or injury on my exam.  He has good strength in his upper and lower extremities.  He does report he had fatigue preceding this fall.  Unclear if this may be related to dehydration versus infection versus anemia versus other.  We will check some labs.  We will also get an EKG and troponin level  ECG reviewed - unchanged from prior, bifascicular block pattern, no acute ischemic findings per my interpretation Xray ordered and reviewed with right sided moderate pleural effusion  Clinical Course as of 08/02/21 1701  Wed Aug 02, 2021  1300 With pleural effusion, troponin elevation, general fatigue, I think is reasonable to admit the patient for echocardiogram and cards evaluation.  We will repeat his troponin.  It is not clear if he is chronically elevated due to his dialysis situation.  His telemetry and EKG are otherwise stable. [MT]  1610 Will page to hospitalist for admission at [facility], given that the patient would require dialysis and likely cardiac evaluation and echocardiogram.  The patient is in agreement with staying. [MT]  1409 4L chronically - correction * [MT]    Clinical Course User Index [MT] Catherene Kaleta, Carola Rhine, MD    Final Clinical Impression(s) / ED Diagnoses Final diagnoses:  Other fatigue  Fall, initial encounter  Injury of head, initial encounter  Elevated troponin  Pleural effusion     Rx / DC Orders ED Discharge Orders     None        Wyvonnia Dusky, MD 08/02/21 1702

## 2021-08-02 NOTE — ED Notes (Signed)
CRITICAL VALUE STICKER  CRITICAL VALUE:Troponin 488  SDBNRWKE (on-site recipient of call):Shawnie Pons, RN DATE & TIME NOTIFIED: 08-02-21 1242  MESSENGER (representative from lab):  MD NOTIFIED: Dr. Langston Masker  TIME OF NOTIFICATION:1243  RESPONSE:

## 2021-08-03 ENCOUNTER — Encounter (HOSPITAL_COMMUNITY): Payer: Self-pay | Admitting: *Deleted

## 2021-08-03 ENCOUNTER — Observation Stay (HOSPITAL_COMMUNITY): Payer: Medicare Other

## 2021-08-03 DIAGNOSIS — Z66 Do not resuscitate: Secondary | ICD-10-CM | POA: Diagnosis present

## 2021-08-03 DIAGNOSIS — I5020 Unspecified systolic (congestive) heart failure: Secondary | ICD-10-CM

## 2021-08-03 DIAGNOSIS — I452 Bifascicular block: Secondary | ICD-10-CM | POA: Diagnosis present

## 2021-08-03 DIAGNOSIS — I5082 Biventricular heart failure: Secondary | ICD-10-CM | POA: Diagnosis present

## 2021-08-03 DIAGNOSIS — Z87891 Personal history of nicotine dependence: Secondary | ICD-10-CM | POA: Diagnosis not present

## 2021-08-03 DIAGNOSIS — J449 Chronic obstructive pulmonary disease, unspecified: Secondary | ICD-10-CM | POA: Diagnosis present

## 2021-08-03 DIAGNOSIS — I5042 Chronic combined systolic (congestive) and diastolic (congestive) heart failure: Secondary | ICD-10-CM | POA: Diagnosis present

## 2021-08-03 DIAGNOSIS — N186 End stage renal disease: Secondary | ICD-10-CM | POA: Diagnosis present

## 2021-08-03 DIAGNOSIS — M898X9 Other specified disorders of bone, unspecified site: Secondary | ICD-10-CM | POA: Diagnosis present

## 2021-08-03 DIAGNOSIS — K219 Gastro-esophageal reflux disease without esophagitis: Secondary | ICD-10-CM | POA: Diagnosis present

## 2021-08-03 DIAGNOSIS — Z9981 Dependence on supplemental oxygen: Secondary | ICD-10-CM | POA: Diagnosis not present

## 2021-08-03 DIAGNOSIS — I350 Nonrheumatic aortic (valve) stenosis: Secondary | ICD-10-CM | POA: Diagnosis not present

## 2021-08-03 DIAGNOSIS — S0101XA Laceration without foreign body of scalp, initial encounter: Secondary | ICD-10-CM | POA: Diagnosis present

## 2021-08-03 DIAGNOSIS — R531 Weakness: Secondary | ICD-10-CM | POA: Diagnosis present

## 2021-08-03 DIAGNOSIS — Z79890 Hormone replacement therapy (postmenopausal): Secondary | ICD-10-CM | POA: Diagnosis not present

## 2021-08-03 DIAGNOSIS — Z20822 Contact with and (suspected) exposure to covid-19: Secondary | ICD-10-CM | POA: Diagnosis present

## 2021-08-03 DIAGNOSIS — I083 Combined rheumatic disorders of mitral, aortic and tricuspid valves: Secondary | ICD-10-CM | POA: Diagnosis present

## 2021-08-03 DIAGNOSIS — Z992 Dependence on renal dialysis: Secondary | ICD-10-CM | POA: Diagnosis not present

## 2021-08-03 DIAGNOSIS — D631 Anemia in chronic kidney disease: Secondary | ICD-10-CM | POA: Diagnosis present

## 2021-08-03 DIAGNOSIS — Z79899 Other long term (current) drug therapy: Secondary | ICD-10-CM | POA: Diagnosis not present

## 2021-08-03 DIAGNOSIS — J9611 Chronic respiratory failure with hypoxia: Secondary | ICD-10-CM | POA: Diagnosis present

## 2021-08-03 DIAGNOSIS — E039 Hypothyroidism, unspecified: Secondary | ICD-10-CM | POA: Diagnosis present

## 2021-08-03 DIAGNOSIS — W1830XA Fall on same level, unspecified, initial encounter: Secondary | ICD-10-CM | POA: Diagnosis present

## 2021-08-03 DIAGNOSIS — R778 Other specified abnormalities of plasma proteins: Secondary | ICD-10-CM | POA: Diagnosis not present

## 2021-08-03 DIAGNOSIS — I132 Hypertensive heart and chronic kidney disease with heart failure and with stage 5 chronic kidney disease, or end stage renal disease: Secondary | ICD-10-CM | POA: Diagnosis present

## 2021-08-03 DIAGNOSIS — I4891 Unspecified atrial fibrillation: Secondary | ICD-10-CM | POA: Diagnosis present

## 2021-08-03 DIAGNOSIS — I420 Dilated cardiomyopathy: Secondary | ICD-10-CM | POA: Diagnosis present

## 2021-08-03 DIAGNOSIS — J90 Pleural effusion, not elsewhere classified: Secondary | ICD-10-CM | POA: Diagnosis present

## 2021-08-03 DIAGNOSIS — N2581 Secondary hyperparathyroidism of renal origin: Secondary | ICD-10-CM | POA: Diagnosis present

## 2021-08-03 LAB — ECHOCARDIOGRAM COMPLETE
AR max vel: 1.4 cm2
AV Area VTI: 1.21 cm2
AV Area mean vel: 1.21 cm2
AV Mean grad: 19 mmHg
AV Peak grad: 30.4 mmHg
Ao pk vel: 2.76 m/s
Area-P 1/2: 3.28 cm2
Height: 68 in
S' Lateral: 4.4 cm
Weight: 2303.37 oz

## 2021-08-03 LAB — BASIC METABOLIC PANEL
Anion gap: 14 (ref 5–15)
BUN: 65 mg/dL — ABNORMAL HIGH (ref 8–23)
CO2: 26 mmol/L (ref 22–32)
Calcium: 8.7 mg/dL — ABNORMAL LOW (ref 8.9–10.3)
Chloride: 93 mmol/L — ABNORMAL LOW (ref 98–111)
Creatinine, Ser: 5.73 mg/dL — ABNORMAL HIGH (ref 0.61–1.24)
GFR, Estimated: 9 mL/min — ABNORMAL LOW (ref >60)
Glucose, Bld: 95 mg/dL (ref 70–99)
Potassium: 4.5 mmol/L (ref 3.5–5.1)
Sodium: 133 mmol/L — ABNORMAL LOW (ref 135–145)

## 2021-08-03 LAB — CBC
HCT: 33.1 % — ABNORMAL LOW (ref 39.0–52.0)
Hemoglobin: 10.3 g/dL — ABNORMAL LOW (ref 13.0–17.0)
MCH: 28.9 pg (ref 26.0–34.0)
MCHC: 31.1 g/dL (ref 30.0–36.0)
MCV: 92.7 fL (ref 80.0–100.0)
Platelets: 239 10*3/uL (ref 150–400)
RBC: 3.57 MIL/uL — ABNORMAL LOW (ref 4.22–5.81)
RDW: 19.4 % — ABNORMAL HIGH (ref 11.5–15.5)
WBC: 6.9 10*3/uL (ref 4.0–10.5)
nRBC: 0 % (ref 0.0–0.2)

## 2021-08-03 MED ORDER — SODIUM CHLORIDE 0.9 % IV SOLN
100.0000 mg | INTRAVENOUS | Status: DC
  Administered 2021-08-04: 100 mg via INTRAVENOUS
  Filled 2021-08-03: qty 5

## 2021-08-03 MED ORDER — CHLORHEXIDINE GLUCONATE CLOTH 2 % EX PADS
6.0000 | MEDICATED_PAD | Freq: Every day | CUTANEOUS | Status: DC
  Administered 2021-08-04: 6 via TOPICAL

## 2021-08-03 MED ORDER — HYDRALAZINE HCL 10 MG PO TABS
10.0000 mg | ORAL_TABLET | Freq: Three times a day (TID) | ORAL | Status: DC
  Administered 2021-08-03 – 2021-08-05 (×6): 10 mg via ORAL
  Filled 2021-08-03 (×6): qty 1

## 2021-08-03 MED ORDER — CARVEDILOL 3.125 MG PO TABS
3.1250 mg | ORAL_TABLET | Freq: Two times a day (BID) | ORAL | Status: DC
  Filled 2021-08-03: qty 1

## 2021-08-03 MED ORDER — ISOSORBIDE MONONITRATE ER 30 MG PO TB24
15.0000 mg | ORAL_TABLET | Freq: Every day | ORAL | Status: DC
  Administered 2021-08-03 – 2021-08-05 (×3): 15 mg via ORAL
  Filled 2021-08-03 (×3): qty 1

## 2021-08-03 MED ORDER — ASPIRIN 81 MG PO CHEW
81.0000 mg | CHEWABLE_TABLET | Freq: Every day | ORAL | Status: DC
  Administered 2021-08-03 – 2021-08-05 (×3): 81 mg via ORAL
  Filled 2021-08-03 (×3): qty 1

## 2021-08-03 NOTE — Progress Notes (Signed)
PROGRESS NOTE                                                                                                                                                                                                             Patient Demographics:    Lonnie [name], is a 87 y.o. male, DOB - [birth year], CXK:[id number]  Outpatient Primary MD for the patient is Lonnie, Ravisankar, MD    LOS - 0  Admit date - 08/02/2021    Chief Complaint  Patient presents with   Fall       Brief Narrative (HPI from H&P)     Lonnie [name] is a 87 y.o. male with medical history significant for ESRD on MWF HD, COPD, chronic respiratory failure with hypoxia on 4 L supplemental O2 via [place], HTN, hypothyroidism who presented to the ED for evaluation of fatigue and recent fall at home.    Subjective:    Lonnie [name] today has, No headache, No chest pain, No abdominal pain - No Nausea, No new weakness tingling or numbness, no SOB.   Assessment  & Plan :    Generalized weakness/fall at home: he has been feeling weak and fatigued for several days, had a fall at home.  Did not lose consciousness.  Head CT was negative.  No focal deficits.    New found chronic combined diastolic and systolic heart failure with EF 20% - Mild troponin elevation 279, downtrending on repeat.  Denies any recent chest pain.  EKG shows bifascicular block, similar to prior.  Small to moderate pleural effusion seen on CXR in the setting of ESRD.  Overall appears compensated however echocardiogram shows new found chronic systolic heart failure with EF of 20%.  Will involve cardiology for input and hopefully medical management only.  Will place him on low-dose aspirin, switch him from Norvasc to low-dose Coreg, doubt he requires a statin at this age.  Will defer further management to cardiology.  Hopefully we can add a low-dose ACE inhibitor as well if blood pressure allows.    ESRD on MWF  HD: Reports last HD 8/29.  Will need nephrology consult in AM for further dialysis needs.  Currently no emergent need for HD tonight.Renal called.   COPD/chronic respiratory failure with hypoxia: Chronic and stable.  Continue supplemental oxygen and albuterol as needed.   Hypertension:  We will try Coreg instead of Norvasc.   Hypothyroidism: Continue Synthroid. Check TSH.        Condition -   Guarded  Family Communication  : Niece Lonnie [name] [phone] on 08/03/2021  Code Status :  DNR  Consults  :  Renal, Cards  PUD Prophylaxis : PPI   Procedures  :     TEE -  1. Left ventricular ejection fraction by 3D volume is 20 %. The left ventricle has severely decreased function. The left ventricle demonstrates global hypokinesis. Left ventricular diastolic parameters are consistent with Grade II diastolic dysfunction (pseudonormalization).  2. Right ventricular systolic function is severely reduced. The right ventricular size is mildly enlarged. There is moderately elevated pulmonary artery systolic pressure. The estimated right ventricular systolic pressure is 57.2 mmHg.  3. Left atrial size was mildly dilated.  4. The mitral valve is normal in structure. Moderate mitral valve regurgitation. No evidence of mitral stenosis.  5. Tricuspid valve regurgitation is moderate.  6. The aortic valve is tricuspid. Aortic valve regurgitation is not visualized. Moderate aortic valve stenosis. Aortic valve area, by VTI measures 1.21 cm. Aortic valve mean gradient measures 19.0 mmHg. Aortic valve Vmax measures 2.76 m/s.  7. The inferior vena cava is normal in size with greater than 50% respiratory variability, suggesting right atrial pressure of 3 mmHg.       Disposition Plan  :    Status is: Observation    Dispo: The patient is from: Home              Anticipated d/c is to: Home              Patient currently is not medically stable to d/c.   Difficult to place patient No   DVT Prophylaxis  :     heparin injection 5,000 Units Start: 08/03/21 0600    Lab Results  Component Value Date   PLT 239 08/03/2021    Diet :  Diet Order             Diet renal with fluid restriction Fluid restriction: 1200 mL Fluid; Room service appropriate? Yes; Fluid consistency: Thin  Diet effective now                    Inpatient Medications  Scheduled Meds:  amLODipine  5 mg Oral QHS   calcium acetate  667 mg Oral TID WC   [START ON 08/04/2021] Chlorhexidine Gluconate Cloth  6 each Topical Q0600   escitalopram  10 mg Oral QHS   heparin  5,000 Units Subcutaneous Q8H   levothyroxine  88 mcg Oral QAC breakfast   mouth rinse  15 mL Mouth Rinse BID   mirabegron ER  50 mg Oral QHS   pantoprazole  20 mg Oral Daily   polyethylene glycol  17 g Oral Daily   sodium chloride flush  3 mL Intravenous Q12H   Continuous Infusions:  [START ON 08/04/2021] iron sucrose     PRN Meds:.acetaminophen **OR** acetaminophen, albuterol, ondansetron **OR** ondansetron (ZOFRAN) IV, polyvinyl alcohol, senna-docusate  Antibiotics  :    Anti-infectives (From admission, onward)    None        Time Spent in minutes  30   Lala Lund M.D on 08/03/2021 at 12:43 PM  To page go to [email or web address]   [facility] -  Office  [phone]    See all Orders from today for further details    Objective:   Vitals:   08/03/21 0400 08/03/21  0500 08/03/21 0805 08/03/21 1201  BP: 125/61  (!) 143/64 138/77  Pulse: (!) 56  (!) 51 (!) 59  Resp: 18  20 18   Temp: 98.9 F (37.2 C)  98.7 F (37.1 C) 98.8 F (37.1 C)  TempSrc: Oral  Oral Oral  SpO2: 96%  95% 94%  Weight:  65.3 kg    Height:        Wt Readings from Last 3 Encounters:  08/03/21 65.3 kg  06/22/21 69.9 kg  11/26/19 69.9 kg    No intake or output data in the 24 hours ending 08/03/21 1243   Physical Exam  Awake Alert, No new F.N deficits, Normal affect [place].AT,PERRAL Supple Neck,No JVD, No cervical lymphadenopathy appriciated.   Symmetrical Chest wall movement, Good air movement bilaterally, CTAB RRR,No Gallops,Rubs or new Murmurs, No Parasternal Heave +ve B.Sounds, Abd Soft, No tenderness, No organomegaly appriciated, No rebound - guarding or rigidity. No Cyanosis, Clubbing or edema, No new Rash or bruise      Data Review:    CBC Recent Labs  Lab 08/02/21 1134 08/03/21 0343  WBC 6.4 6.9  HGB 11.2* 10.3*  HCT 37.4* 33.1*  PLT 259 239  MCV 93.3 92.7  MCH 27.9 28.9  MCHC 29.9* 31.1  RDW 19.4* 19.4*  LYMPHSABS 0.6*  --   MONOABS 0.8  --   EOSABS 0.4  --   BASOSABS 0.1  --     Recent Labs  Lab 08/02/21 1134 08/03/21 0343  NA 136 133*  K 4.3 4.5  CL 92* 93*  CO2 32 26  GLUCOSE 99 95  BUN 53* 65*  CREATININE 5.08* 5.73*  CALCIUM 9.8 8.7*  BNP >4,500.0*  --     ------------------------------------------------------------------------------------------------------------------ No results for input(s): CHOL, HDL, LDLCALC, TRIG, CHOLHDL, LDLDIRECT in the last 72 hours.  No results found for: HGBA1C ------------------------------------------------------------------------------------------------------------------ No results for input(s): TSH, T4TOTAL, T3FREE, THYROIDAB in the last 72 hours.  Invalid input(s): FREET3  Cardiac Enzymes No results for input(s): CKMB, TROPONINI, MYOGLOBIN in the last 168 hours.  Invalid input(s): CK ------------------------------------------------------------------------------------------------------------------    Component Value Date/Time   BNP >4,500.0 (H) 08/02/2021 1134      Radiology Reports DG Chest 2 View  Result Date: 08/02/2021 CLINICAL DATA:  Fall.  Backwards fall EXAM: CHEST - 2 VIEW COMPARISON:  3242 FINDINGS: Twenty normal cardiac silhouette. There is a moderate RIGHT effusion. Small LEFT effusion. No pneumothorax. No fracture. IMPRESSION: 1. Bilateral effusions.  Moderate RIGHT effusion. 2. No pneumothorax or fracture identified. Electronically  Signed   By: Suzy Bouchard M.D.   On: 08/02/2021 12:41   CT HEAD WO CONTRAST (5MM)  Result Date: 08/02/2021 CLINICAL DATA:  Head trauma, minor (Age >= 65y) posterior head injury, fall, 3 days ago EXAM: CT HEAD WITHOUT CONTRAST TECHNIQUE: Contiguous axial images were obtained from the base of the skull through the vertex without intravenous contrast. COMPARISON:  11/12/2012 FINDINGS: Brain: No evidence of acute infarction, hemorrhage, hydrocephalus, extra-axial collection or mass lesion/mass effect. Scattered low-density changes within the periventricular and subcortical white matter compatible with chronic microvascular ischemic change. Mild diffuse cerebral volume loss. Vascular: Atherosclerotic calcifications involving the large vessels of the skull base. No unexpected hyperdense vessel. Skull: Normal. Negative for fracture or focal lesion. Sinuses/Orbits: No acute finding. Other: Soft tissue swelling with ill-defined hematoma in the parieto-occipital scalp. IMPRESSION: 1. No acute intracranial findings. 2. Soft tissue swelling with ill-defined hematoma in the parieto-occipital scalp. No underlying calvarial fracture. 3. Chronic microvascular ischemic change and cerebral  volume loss. Electronically Signed   By: Davina Poke D.O.   On: 08/02/2021 12:29   ECHOCARDIOGRAM COMPLETE  Result Date: 08/03/2021    ECHOCARDIOGRAM REPORT   Patient Name:   DOSS CYBULSKI Date of Exam: 08/03/2021 Medical Rec #:  [id number]       Height:       68.0 in Accession #:    [id number]      Weight:       144.0 lb Date of Birth:  1925-04-04       BSA:          1.777 m Patient Age:    68 years        BP:           143/64 mmHg Patient Gender: M               HR:           58 bpm. Exam Location:  Inpatient Procedure: 2D Echo, 3D Echo, Cardiac Doppler and Color Doppler Indications:    Elevated Troponin  History:        Patient has no prior history of Echocardiogram examinations.                 COPD; Risk Factors:Former Smoker  and Hypertension. Generalized                 weakness/mechanical fall at home. Chronic kidney disease,                 Chronic respiratory failure with hypoxia, thyroid disease.  Sonographer:    Darlina Sicilian RDCS Referring Phys: [id number] [place]  1. Left ventricular ejection fraction by 3D volume is 20 %. The left ventricle has severely decreased function. The left ventricle demonstrates global hypokinesis. Left ventricular diastolic parameters are consistent with Grade II diastolic dysfunction (pseudonormalization).  2. Right ventricular systolic function is severely reduced. The right ventricular size is mildly enlarged. There is moderately elevated pulmonary artery systolic pressure. The estimated right ventricular systolic pressure is 43.3 mmHg.  3. Left atrial size was mildly dilated.  4. The mitral valve is normal in structure. Moderate mitral valve regurgitation. No evidence of mitral stenosis.  5. Tricuspid valve regurgitation is moderate.  6. The aortic valve is tricuspid. Aortic valve regurgitation is not visualized. Moderate aortic valve stenosis. Aortic valve area, by VTI measures 1.21 cm. Aortic valve mean gradient measures 19.0 mmHg. Aortic valve Vmax measures 2.76 m/s.  7. The inferior vena cava is normal in size with greater than 50% respiratory variability, suggesting right atrial pressure of 3 mmHg. FINDINGS  Left Ventricle: Left ventricular ejection fraction by 3D volume is 20 %. The left ventricle has severely decreased function. The left ventricle demonstrates global hypokinesis. The left ventricular internal cavity size was normal in size. There is no left ventricular hypertrophy. Left ventricular diastolic parameters are consistent with Grade II diastolic dysfunction (pseudonormalization). Right Ventricle: The right ventricular size is mildly enlarged. No increase in right ventricular wall thickness. Right ventricular systolic function is severely reduced. There is  moderately elevated pulmonary artery systolic pressure. The tricuspid regurgitant velocity is 3.13 m/s, and with an assumed right atrial pressure of 8 mmHg, the estimated right ventricular systolic pressure is 29.5 mmHg. Left Atrium: Left atrial size was mildly dilated. Right Atrium: Right atrial size was normal in size. Pericardium: There is no evidence of pericardial effusion. Mitral Valve: The mitral valve is normal in structure. Moderate mitral valve regurgitation. No  evidence of mitral valve stenosis. Tricuspid Valve: The tricuspid valve is normal in structure. Tricuspid valve regurgitation is moderate . No evidence of tricuspid stenosis. Aortic Valve: The aortic valve is tricuspid. Aortic valve regurgitation is not visualized. Moderate aortic stenosis is present. Aortic valve mean gradient measures 19.0 mmHg. Aortic valve peak gradient measures 30.4 mmHg. Aortic valve area, by VTI measures 1.21 cm. Pulmonic Valve: The pulmonic valve was normal in structure. Pulmonic valve regurgitation is trivial. No evidence of pulmonic stenosis. Aorta: The aortic root is normal in size and structure. Venous: The inferior vena cava is normal in size with greater than 50% respiratory variability, suggesting right atrial pressure of 3 mmHg. IAS/Shunts: No atrial level shunt detected by color flow Doppler.  LEFT VENTRICLE PLAX 2D LVIDd:         5.40 cm         Diastology LVIDs:         4.40 cm         LV e' medial:    4.05 cm/s LV PW:         0.90 cm         LV E/e' medial:  34.5 LV IVS:        0.90 cm         LV e' lateral:   10.30 cm/s LVOT diam:     2.70 cm         LV E/e' lateral: 13.6 LV SV:         85 LV SV Index:   48 LVOT Area:     5.73 cm        3D Volume EF                                LV 3D EF:    Left                                             ventricul                                             ar                                             ejection                                             fraction                                              by 3D                                             volume is  20 %.                                 3D Volume EF:                                3D EF:        20 %                                LV EDV:       153 ml                                LV ESV:       123 ml                                LV SV:        30 ml RIGHT VENTRICLE RV S prime:     5.96 cm/s TAPSE (M-mode): 1.1 cm LEFT ATRIUM             Index       RIGHT ATRIUM           Index LA diam:        4.20 cm 2.36 cm/m  RA Area:     17.00 cm LA Vol (A2C):   70.3 ml 39.56 ml/m RA Volume:   39.10 ml  22.00 ml/m LA Vol (A4C):   74.1 ml 41.69 ml/m LA Biplane Vol: 73.1 ml 41.13 ml/m  AORTIC VALVE AV Area (Vmax):    1.40 cm AV Area (Vmean):   1.21 cm AV Area (VTI):     1.21 cm AV Vmax:           275.60 cm/s AV Vmean:          208.200 cm/s AV VTI:            0.700 m AV Peak Grad:      30.4 mmHg AV Mean Grad:      19.0 mmHg LVOT Vmax:         67.30 cm/s LVOT Vmean:        43.900 cm/s LVOT VTI:          0.148 m LVOT/AV VTI ratio: 0.21  AORTA Ao Root diam: 3.00 cm Ao Asc diam:  3.80 cm MITRAL VALVE                TRICUSPID VALVE MV Area (PHT): 3.28 cm     TR Peak grad:   39.2 mmHg MV Decel Time: 231 msec     TR Vmax:        313.00 cm/s MV E velocity: 139.67 cm/s MV A velocity: 79.10 cm/s   SHUNTS MV E/A ratio:  1.77         Systemic VTI:  0.15 m                             Systemic Diam: 2.70 cm Candee Furbish MD Electronically signed by Candee Furbish MD Signature Date/Time: 08/03/2021/11:17:51 AM    Final

## 2021-08-03 NOTE — Progress Notes (Signed)
  Echocardiogram 2D Echocardiogram has been performed.  Darlina Sicilian M 08/03/2021, 9:38 AM

## 2021-08-03 NOTE — Progress Notes (Signed)
Inpatient Rehab Admissions Coordinator:   Per PT recommendations pt was screened by Shann Medal, PT, DPT.  Note OT recommending [place] and pts must have therapy needs in at least 2 disciplines to qualify for CIR.  Will not place a consult order at this time.  Please call me with questions.   Shann Medal, PT, DPT Admissions Coordinator [phone] 08/03/21  4:19 PM

## 2021-08-03 NOTE — Consult Note (Signed)
[facility] Renal Consultation Note    Indication for Consultation:  Management of ESRD/hemodialysis; anemia, hypertension/volume and secondary hyperparathyroidism PCP:  HPI: Lonnie [name] is a 87 y.o. male with ESRD on hemodialysis MWF at [facility]. PMH: HTN, COPD, chronic respiratory failure on home O2, who presented to [place] with C/O fatigue and recent fall. Labs unremarkable for ESRD, Troponin 279, CXR Bilateral effusions, R moderate effusion. He was admitted as observation patient for fatigue. ECHO done later today showed new reduction of EF to 20% with global hypokinesis, Grade II diastolic dysfunction, RV systolic function severely reduced. Cardiology will be consulted by primary.   Seen in room. He has known me for years but does not recognize me. Says he feels fine, denies SOB, denies chest pain. Not as talkative as he usually is at [place].   Past Medical History:  Diagnosis Date   Adenomatous polyps    Bursitis    left hip   Chronic kidney disease    Stage 4   Frequent urination at night    GERD (gastroesophageal reflux disease)    Hypertension    Hypothyroidism    Pneumonia    Polyclonal gammopathy determined by serum protein electrophoresis 10/04/2015   Past Surgical History:  Procedure Laterality Date   AORTIC ARCH ANGIOGRAPHY N/A 08/01/2017   Procedure: Aortic Arch Angiography;  Surgeon: Conrad Anton Ch[place], MD;  Location: [facility];  Service: Cardiovascular;  Laterality: N/A;   AV FISTULA PLACEMENT Left 07/10/2016   Procedure: LEFT BRACHIOCEPHALIC ARTERIOVENOUS (AV) FISTULA CREATION;  Surgeon: Conrad Freeburg, MD;  Location: [place];  Service: Vascular;  Laterality: Left;   COLONOSCOPY     RENAL BIOPSY Left 10/06/2015   REVISON OF ARTERIOVENOUS FISTULA Left 93/26/7124   Procedure: PLICATION OF LEFT BRACHIOCEPHALIC ARTERIOVENOUS FISTULA;  Surgeon: Conrad Reynoldsburg, MD;  Location: [place];  Service: Vascular;  Laterality: Left;    SKIN SURGERY     back   UPPER EXTREMITY ANGIOGRAPHY Left 08/01/2017   Procedure: Upper Extremity Angiography;  Surgeon: Conrad Betts[place], MD;  Location: [place] [facility];  Service: Cardiovascular;  Laterality: Left;   Family History  Problem Relation Age of Onset   Diabetes Father    Diabetes Brother    Heart disease Brother    Colon cancer Neg Hx    Rectal cancer Neg Hx    Social History:  reports that he quit smoking about 41 years ago. He has never used smokeless tobacco. He reports that he does not drink alcohol and does not use drugs. No Known Allergies Prior to Admission medications   Medication Sig Start Date End Date Taking? Authorizing Provider  amLODipine (NORVASC) 5 MG tablet Take 5 mg by mouth at bedtime.  03/23/14  Yes [provider]  calcium acetate (PHOSLO) 667 MG capsule Take 1 capsule by mouth 3 (three) times daily with meals.  04/29/17  Yes [provider]  Cyanocobalamin (VITAMIN B-12 PO) Take 1 tablet by mouth daily.   Yes [provider]  escitalopram (LEXAPRO) 10 MG tablet Take 10 mg by mouth at bedtime.  06/14/16  Yes [provider]  levothyroxine (SYNTHROID, LEVOTHROID) 88 MCG tablet Take 88 mcg by mouth at bedtime.   Yes [provider]  mirabegron ER (MYRBETRIQ) 50 MG TB24 tablet Take 50 mg by mouth at bedtime.    Yes [provider]  multivitamin (RENA-VIT) TABS tablet Take 1 tablet by mouth daily.   Yes  [provider]  pantoprazole (PROTONIX) 20 MG tablet Take 1 tablet (20 mg total) by mouth daily. 04/10/21  Yes Lamptey, Myrene Galas, MD  acetaminophen (TYLENOL) 325 MG tablet Take 2 tablets (650 mg total) by mouth every 6 (six) hours as needed for mild pain. 01/26/21   Hans Eden, NP  diclofenac Sodium (VOLTAREN) 1 % GEL Apply 4 g topically 4 (four) times daily. 04/10/21   Lamptey, Myrene Galas, MD  diphenhydramine-acetaminophen (TYLENOL PM) 25-500 MG TABS tablet Take 1 tablet by mouth at bedtime as needed  (sleep).    [provider]  lidocaine-prilocaine (EMLA) cream Apply 1 application topically daily as needed (port access).  02/06/17   [provider]  polyethylene glycol (MIRALAX) packet Take 17 g by mouth daily. 12/30/18   Robyn Haber, MD  polyvinyl alcohol (LIQUIFILM TEARS) 1.4 % ophthalmic solution Place 1-2 drops into both eyes as needed for dry eyes.    [provider]   Current Facility-Administered Medications  Medication Dose Route Frequency Provider Last Rate Last Admin   acetaminophen (TYLENOL) tablet 650 mg  650 mg Oral Q6H PRN Lenore Cordia, MD       Or   acetaminophen (TYLENOL) suppository 650 mg  650 mg Rectal Q6H PRN Zada Finders R, MD       albuterol (PROVENTIL) (2.5 MG/3ML) 0.083% nebulizer solution 2.5 mg  2.5 mg Nebulization Q6H PRN Lenore Cordia, MD       aspirin chewable tablet 81 mg  81 mg Oral Daily Thurnell Lose, MD   81 mg at 08/03/21 1402   calcium acetate (PHOSLO) capsule 667 mg  667 mg Oral TID WC Zada Finders R, MD   667 mg at 08/03/21 1218   carvedilol (COREG) tablet 3.125 mg  3.125 mg Oral BID WC Thurnell Lose, MD       [START ON 08/04/2021] Chlorhexidine Gluconate Cloth 2 % PADS 6 each  6 each Topical Q0600 Valentina Gu, NP       escitalopram (LEXAPRO) tablet 10 mg  10 mg Oral QHS Zada Finders R, MD   10 mg at 08/02/21 2135   heparin injection 5,000 Units  5,000 Units Subcutaneous Q8H Zada Finders R, MD   5,000 Units at 08/03/21 1402   [START ON 08/04/2021] iron sucrose (VENOFER) 100 mg in sodium chloride 0.9 % 100 mL IVPB  100 mg Intravenous Q M,W,F-HD Valentina Gu, NP       levothyroxine (SYNTHROID) tablet 88 mcg  88 mcg Oral QAC breakfast Lenore Cordia, MD   88 mcg at 08/03/21 0912   MEDLINE mouth rinse  15 mL Mouth Rinse BID Zada Finders R, MD   15 mL at 08/03/21 0913   mirabegron ER (MYRBETRIQ) tablet 50 mg  50 mg Oral QHS Zada Finders R, MD   50 mg at 08/02/21 2135   ondansetron (ZOFRAN) tablet  4 mg  4 mg Oral Q6H PRN Lenore Cordia, MD       Or   ondansetron (ZOFRAN) injection 4 mg  4 mg Intravenous Q6H PRN Lenore Cordia, MD       pantoprazole (PROTONIX) EC tablet 20 mg  20 mg Oral Daily Zada Finders R, MD   20 mg at 08/03/21 0912   polyethylene glycol (MIRALAX / GLYCOLAX) packet 17 g  17 g Oral Daily Lenore Cordia, MD   17 g at 08/03/21 0912   polyvinyl alcohol (LIQUIFILM TEARS) 1.4 % ophthalmic solution 1-2 drop  1-2 drop Both Eyes PRN Lenore Cordia, MD       senna-docusate (Senokot-S) tablet 1 tablet  1 tablet Oral QHS PRN Zada Finders R, MD       sodium chloride flush (NS) 0.9 % injection 3 mL  3 mL Intravenous Q12H Lenore Cordia, MD   3 mL at 08/03/21 0913   Labs: Basic Metabolic Panel: Recent Labs  Lab 08/02/21 1134 08/03/21 0343  NA 136 133*  K 4.3 4.5  CL 92* 93*  CO2 32 26  GLUCOSE 99 95  BUN 53* 65*  CREATININE 5.08* 5.73*  CALCIUM 9.8 8.7*   Liver Function Tests: No results for input(s): AST, ALT, ALKPHOS, BILITOT, PROT, ALBUMIN in the last 168 hours. No results for input(s): LIPASE, AMYLASE in the last 168 hours. No results for input(s): AMMONIA in the last 168 hours. CBC: Recent Labs  Lab 08/02/21 1134 08/03/21 0343  WBC 6.4 6.9  NEUTROABS 4.5  --   HGB 11.2* 10.3*  HCT 37.4* 33.1*  MCV 93.3 92.7  PLT 259 239   Cardiac Enzymes: No results for input(s): CKTOTAL, CKMB, CKMBINDEX, TROPONINI in the last 168 hours. CBG: No results for input(s): GLUCAP in the last 168 hours. Iron Studies: No results for input(s): IRON, TIBC, TRANSFERRIN, FERRITIN in the last 72 hours. Studies/Results: DG Chest 2 View  Result Date: 08/02/2021 CLINICAL DATA:  Fall.  Backwards fall EXAM: CHEST - 2 VIEW COMPARISON:  3242 FINDINGS: Twenty normal cardiac silhouette. There is a moderate RIGHT effusion. Small LEFT effusion. No pneumothorax. No fracture. IMPRESSION: 1. Bilateral effusions.  Moderate RIGHT effusion. 2. No pneumothorax or fracture identified.  Electronically Signed   By: Suzy Bouchard M.D.   On: 08/02/2021 12:41   CT HEAD WO CONTRAST (5MM)  Result Date: 08/02/2021 CLINICAL DATA:  Head trauma, minor (Age >= 65y) posterior head injury, fall, 3 days ago EXAM: CT HEAD WITHOUT CONTRAST TECHNIQUE: Contiguous axial images were obtained from the base of the skull through the vertex without intravenous contrast. COMPARISON:  11/12/2012 FINDINGS: Brain: No evidence of acute infarction, hemorrhage, hydrocephalus, extra-axial collection or mass lesion/mass effect. Scattered low-density changes within the periventricular and subcortical white matter compatible with chronic microvascular ischemic change. Mild diffuse cerebral volume loss. Vascular: Atherosclerotic calcifications involving the large vessels of the skull base. No unexpected hyperdense vessel. Skull: Normal. Negative for fracture or focal lesion. Sinuses/Orbits: No acute finding. Other: Soft tissue swelling with ill-defined hematoma in the parieto-occipital scalp. IMPRESSION: 1. No acute intracranial findings. 2. Soft tissue swelling with ill-defined hematoma in the parieto-occipital scalp. No underlying calvarial fracture. 3. Chronic microvascular ischemic change and cerebral volume loss. Electronically Signed   By: Davina Poke D.O.   On: 08/02/2021 12:29   ECHOCARDIOGRAM COMPLETE  Result Date: 08/03/2021    ECHOCARDIOGRAM REPORT   Patient Name:   MAYCEN DEGREGORY Date of Exam: 08/03/2021 Medical Rec #:  [id number]       Height:       68.0 in Accession #:    [id number]      Weight:       144.0 lb Date of Birth:  May 17, 1925       BSA:          1.777 m Patient Age:    20 years        BP:           143/64 mmHg Patient Gender: M  HR:           58 bpm. Exam Location:  Inpatient Procedure: 2D Echo, 3D Echo, Cardiac Doppler and Color Doppler Indications:    Elevated Troponin  History:        Patient has no prior history of Echocardiogram examinations.                 COPD; Risk  Factors:Former Smoker and Hypertension. Generalized                 weakness/mechanical fall at home. Chronic kidney disease,                 Chronic respiratory failure with hypoxia, thyroid disease.  Sonographer:    Darlina Sicilian RDCS Referring Phys: [id number] [place]  1. Left ventricular ejection fraction by 3D volume is 20 %. The left ventricle has severely decreased function. The left ventricle demonstrates global hypokinesis. Left ventricular diastolic parameters are consistent with Grade II diastolic dysfunction (pseudonormalization).  2. Right ventricular systolic function is severely reduced. The right ventricular size is mildly enlarged. There is moderately elevated pulmonary artery systolic pressure. The estimated right ventricular systolic pressure is 40.8 mmHg.  3. Left atrial size was mildly dilated.  4. The mitral valve is normal in structure. Moderate mitral valve regurgitation. No evidence of mitral stenosis.  5. Tricuspid valve regurgitation is moderate.  6. The aortic valve is tricuspid. Aortic valve regurgitation is not visualized. Moderate aortic valve stenosis. Aortic valve area, by VTI measures 1.21 cm. Aortic valve mean gradient measures 19.0 mmHg. Aortic valve Vmax measures 2.76 m/s.  7. The inferior vena cava is normal in size with greater than 50% respiratory variability, suggesting right atrial pressure of 3 mmHg. FINDINGS  Left Ventricle: Left ventricular ejection fraction by 3D volume is 20 %. The left ventricle has severely decreased function. The left ventricle demonstrates global hypokinesis. The left ventricular internal cavity size was normal in size. There is no left ventricular hypertrophy. Left ventricular diastolic parameters are consistent with Grade II diastolic dysfunction (pseudonormalization). Right Ventricle: The right ventricular size is mildly enlarged. No increase in right ventricular wall thickness. Right ventricular systolic function is severely  reduced. There is moderately elevated pulmonary artery systolic pressure. The tricuspid regurgitant velocity is 3.13 m/s, and with an assumed right atrial pressure of 8 mmHg, the estimated right ventricular systolic pressure is 14.4 mmHg. Left Atrium: Left atrial size was mildly dilated. Right Atrium: Right atrial size was normal in size. Pericardium: There is no evidence of pericardial effusion. Mitral Valve: The mitral valve is normal in structure. Moderate mitral valve regurgitation. No evidence of mitral valve stenosis. Tricuspid Valve: The tricuspid valve is normal in structure. Tricuspid valve regurgitation is moderate . No evidence of tricuspid stenosis. Aortic Valve: The aortic valve is tricuspid. Aortic valve regurgitation is not visualized. Moderate aortic stenosis is present. Aortic valve mean gradient measures 19.0 mmHg. Aortic valve peak gradient measures 30.4 mmHg. Aortic valve area, by VTI measures 1.21 cm. Pulmonic Valve: The pulmonic valve was normal in structure. Pulmonic valve regurgitation is trivial. No evidence of pulmonic stenosis. Aorta: The aortic root is normal in size and structure. Venous: The inferior vena cava is normal in size with greater than 50% respiratory variability, suggesting right atrial pressure of 3 mmHg. IAS/Shunts: No atrial level shunt detected by color flow Doppler.  LEFT VENTRICLE PLAX 2D LVIDd:         5.40 cm  Diastology LVIDs:         4.40 cm         LV e' medial:    4.05 cm/s LV PW:         0.90 cm         LV E/e' medial:  34.5 LV IVS:        0.90 cm         LV e' lateral:   10.30 cm/s LVOT diam:     2.70 cm         LV E/e' lateral: 13.6 LV SV:         85 LV SV Index:   48 LVOT Area:     5.73 cm        3D Volume EF                                LV 3D EF:    Left                                             ventricul                                             ar                                             ejection                                              fraction                                             by 3D                                             volume is                                             20 %.                                 3D Volume EF:                                3D EF:        20 %                                LV  EDV:       153 ml                                LV ESV:       123 ml                                LV SV:        30 ml RIGHT VENTRICLE RV S prime:     5.96 cm/s TAPSE (M-mode): 1.1 cm LEFT ATRIUM             Index       RIGHT ATRIUM           Index LA diam:        4.20 cm 2.36 cm/m  RA Area:     17.00 cm LA Vol (A2C):   70.3 ml 39.56 ml/m RA Volume:   39.10 ml  22.00 ml/m LA Vol (A4C):   74.1 ml 41.69 ml/m LA Biplane Vol: 73.1 ml 41.13 ml/m  AORTIC VALVE AV Area (Vmax):    1.40 cm AV Area (Vmean):   1.21 cm AV Area (VTI):     1.21 cm AV Vmax:           275.60 cm/s AV Vmean:          208.200 cm/s AV VTI:            0.700 m AV Peak Grad:      30.4 mmHg AV Mean Grad:      19.0 mmHg LVOT Vmax:         67.30 cm/s LVOT Vmean:        43.900 cm/s LVOT VTI:          0.148 m LVOT/AV VTI ratio: 0.21  AORTA Ao Root diam: 3.00 cm Ao Asc diam:  3.80 cm MITRAL VALVE                TRICUSPID VALVE MV Area (PHT): 3.28 cm     TR Peak grad:   39.2 mmHg MV Decel Time: 231 msec     TR Vmax:        313.00 cm/s MV E velocity: 139.67 cm/s MV A velocity: 79.10 cm/s   SHUNTS MV E/A ratio:  1.77         Systemic VTI:  0.15 m                             Systemic Diam: 2.70 cm Candee Furbish MD Electronically signed by Candee Furbish MD Signature Date/Time: 08/03/2021/11:17:51 AM    Final     ROS: As per HPI otherwise negative.  Physical Exam: Vitals:   08/03/21 0400 08/03/21 0500 08/03/21 0805 08/03/21 1201  BP: 125/61  (!) 143/64 138/77  Pulse: (!) 56  (!) 51 (!) 59  Resp: 18  20 18   Temp: 98.9 F (37.2 C)  98.7 F (37.1 C) 98.8 F (37.1 C)  TempSrc: Oral  Oral Oral  SpO2: 96%  95% 94%  Weight:  65.3 kg    Height:         General:  Well developed, well nourished, in no acute distress. Head: Normocephalic, atraumatic, sclera non-icteric, mucus membranes are moist Neck: Supple. JVD not elevated. Lungs: Clear bilaterally to auscultation without wheezes, rales, or rhonchi. Breathing is  unlabored. Heart: RRR with S1 S2 HR slow irreg rate 50s JR  Abdomen: Soft, non-tender, non-distended with normoactive bowel sounds. No rebound/guarding. No obvious abdominal masses. M-S:  Strength and tone appear normal for age. Lower extremities:without edema or ischemic changes, no open wounds  Neuro: Alert and oriented X 3. Moves all extremities spontaneously. Psych:  Responds to questions appropriately with a normal affect. Dialysis Access:  HD orders: GOC MWF 3.5 hrs 180NRe 400/Autoflow 1.5 66 kg 2.0K/2.0 Ca AVF -No heparin  -Venofer 100 mg IV X 5 doses-3/5 doses given -Venofer 50 mg IV weekly -Mircera 100 mcg IV q 2 weeks (last dose 07/26/2021)   Assessment/Plan:  New Combined systolic and diastolic HF with EF 68%. G2DD. Bilateral pleural effusions on CXR. Cardiology will be consulted. Manage volume with HD.  Chronic Respiratory Failure/COPD: Management per primary.   ESRD -  MWF next HD 08/04/2021.   Hypertension/volume  - BP controlled. Amlodipine changed to carvedilol per primary. Left under EDW last HD 08/. Lower volume as tolerated.   Anemia  - HGB at goal. Recent ESA and Fe Load. Follow HGB.   Metabolic bone disease - OP labs at goal. Continue binders.  Nutrition - Renal diet. Nepro.   Dwayn Moravek H. Owens Shark, NP-C 08/03/2021, 3:35 PM  [employer] [phone]

## 2021-08-03 NOTE — Evaluation (Signed)
Physical Therapy Evaluation Patient Details Name: Lonnie [name] MRN: [id number] DOB: 1925-07-27 Today's Date: 08/03/2021   History of Present Illness  87 y.o. male presenting to ED 8/31 with generalized weakness and falls reporting hitting his head without LOC. CT head (-) for actue findings. CXR (+) moderate R pelural effusion and small L effusion. PMHx significant for ESRD on HD WMF, COPD, chronic respiratory failure on 2-4L home O2, HTN, and thyroid disease.  Clinical Impression   Pt admitted secondary to problem above with deficits below. PTA patient was ambulating in home with rollator modified independent prior to recent fall. He reports he takes a bus to OP HD, which he uses a lift to access. He states he stands with someone's assist while on the lift.  Pt currently requires mod assist to stand and was unsafe to ambulate with one person assist with multiple lines/monitors. Patient has had a functional decline and there is a reasonable expectation that he can make progress with continued therapies.  Anticipate patient will benefit from PT to address problems listed below.Will continue to follow acutely to maximize functional mobility independence and safety.       Follow Up Recommendations CIR    Equipment Recommendations  Wheelchair (measurements PT);Wheelchair cushion (measurements PT) (for use to/from HD if discharges home)    Recommendations for Other Services Rehab consult     Precautions / Restrictions Precautions Precautions: Fall Precaution Comments: HOH; monitor vitals; erythema and drainage at posterior aspect of head from fall      Mobility  Bed Mobility Overal bed mobility: Needs Assistance Bed Mobility: Supine to Sit;Sit to Supine     Supine to sit: Min assist;HOB elevated (with rail) Sit to supine: Min assist   General bed mobility comments: educated on rolling to side and side to sit with min assist and use of rail; assist to raise legs onto bed for return to  supine    Transfers Overall transfer level: Needs assistance Equipment used: Rolling walker (2 wheeled) Transfers: Sit to/from Stand Sit to Stand: Mod assist;From elevated surface         General transfer comment: initially unable to stand from bed at lowest height with 1 person assist; mod assist with bed elevated; repeated x 2 for strengthening  Ambulation/Gait             General Gait Details: pt too unsteady/weak to attempt with 1 person assist and multiple lines/monitors; was able to step in place x 15 paces with min assist for balance.  Stairs            Wheelchair Mobility    Modified Rankin (Stroke Patients Only)       Balance Overall balance assessment: Needs assistance Sitting-balance support: Single extremity supported;Bilateral upper extremity supported;Feet supported Sitting balance-Leahy Scale: Fair Sitting balance - Comments: Maintains static sitting balance at EOB without external assist.   Standing balance support: Bilateral upper extremity supported;During functional activity Standing balance-Leahy Scale: Poor Standing balance comment: Reliant on BUE and external assist for dynamic standing balance.                             Pertinent Vitals/Pain Pain Assessment: No/denies pain    Home Living Family/patient expects to be discharged to:: Private residence Living Arrangements: Other relatives;Other (Comment) (2 nephews split the week providing 24hr supervision/assist) Available Help at Discharge: Family;Available 24 hours/day (PCA through [place] 7 days/wk for 1 hr.) Type of Home: [place]  Access: Stairs to enter Entrance Stairs-Rails: Right;Left (Has been requiring HHA for stair negotiation) Entrance Stairs-Number of Steps: 4-5 Home Layout: One level Home Equipment: Walker - 4 wheels;Cane - single point;Bedside commode;Shower seat Patent attorney is broken; trying to get repaired/replaced)      Prior Function Level of  Independence: Needs assistance   Gait / Transfers Assistance Needed: Assist with stair negotiation; ambulates in home with use of rollator and no external assist. Able to walk onto van lift and stand while lifted onto bus to take to HD 3 days/week.  ADL's / Homemaking Assistance Needed: Assist with bathing/dressing; was completing toileting/hygiene/clothing management without external assist.        Hand Dominance   Dominant Hand: Right    Extremity/Trunk Assessment   Upper Extremity Assessment Upper Extremity Assessment: Defer to OT evaluation    Lower Extremity Assessment Lower Extremity Assessment: Generalized weakness (required mod assist to come to stand)    Cervical / Trunk Assessment Cervical / Trunk Assessment: Kyphotic  Communication   Communication: HOH  Cognition Arousal/Alertness: Awake/alert Behavior During Therapy: WFL for tasks assessed/performed Overall Cognitive Status: Within Functional Limits for tasks assessed                                 General Comments: A&Ox4; able to provide PLOF and home set-up without difficulty.      General Comments General comments (skin integrity, edema, etc.): HR in 50's througout. SpO2 88-93% on 2L O2 via [place].    Exercises     Assessment/Plan    PT Assessment Patient needs continued PT services  PT Problem List Decreased strength;Decreased activity tolerance;Decreased balance;Decreased mobility;Decreased knowledge of use of DME       PT Treatment Interventions DME instruction;Gait training;Stair training;Functional mobility training;Therapeutic activities;Therapeutic exercise;Balance training;Patient/family education    PT Goals (Current goals can be found in the Care Plan section)  Acute Rehab PT Goals Patient Stated Goal: To return home with family. PT Goal Formulation: With patient Time For Goal Achievement: 08/17/21 Potential to Achieve Goals: Good    Frequency Min 4X/week   Barriers to  discharge Inaccessible home environment 5 steps to enter (currently weak); must leave 3x/week for HD    Co-evaluation               AM-PAC PT "6 Clicks" Mobility  Outcome Measure Help needed turning from your back to your side while in a flat bed without using bedrails?: None Help needed moving from lying on your back to sitting on the side of a flat bed without using bedrails?: A Lot Help needed moving to and from a bed to a chair (including a wheelchair)?: A Lot Help needed standing up from a chair using your arms (e.g., wheelchair or bedside chair)?: A Lot Help needed to walk in hospital room?: Total Help needed climbing 3-5 steps with a railing? : Total 6 Click Score: 12    End of Session Equipment Utilized During Treatment: Gait belt;Oxygen Activity Tolerance: Patient limited by fatigue Patient left: in bed;with call bell/phone within reach;with bed alarm set   PT Visit Diagnosis: History of falling (Z91.81);Muscle weakness (generalized) (M62.81)    Time: 6945-0388 PT Time Calculation (min) (ACUTE ONLY): 30 min   Charges:   PT Evaluation $PT Eval Moderate Complexity: 1 Mod PT Treatments $Therapeutic Activity: 8-22 mins         Arby Barrette, PT Pager [phone]   Jeanie Cooks  Walter Grima 08/03/2021, 3:25 PM

## 2021-08-03 NOTE — Progress Notes (Addendum)
PT Cancellation Note  Patient Details Name: Lonnie [name] MRN: [id number] DOB: 1925-03-31   Cancelled Treatment:    Reason Eval/Treat Not Completed: Patient at procedure or test/unavailable  Will return today to complete evaluation.    Addendum 11:22 a.m.--pt now on [facility] and "needs a while" due to constipation.  Arby Barrette, PT Pager [phone]   Rexanne Mano 08/03/2021, 8:50 AM

## 2021-08-03 NOTE — Evaluation (Signed)
Occupational Therapy Evaluation Patient Details Name: Lonnie [name] MRN: [id number] DOB: August 08, 1925 Today's Date: 08/03/2021    History of Present Illness 87 y.o. male presenting to ED 8/31 with generalized weakness and falls reporting hitting his head without LOC. CT head (-) for actue findings. CXR (+) moderate R pelural effusion and small L effusion. PMHx significant for ESRD on HD WMF, COPD, chronic respiratory failure on 2-4L home O2, HTN, and thyroid disease.   Clinical Impression   PTA patient was living alone in a private residence with 24hr supervision/assist from family and PCA 7 days/wk for 1hr. Patient reports requiring assist with bathing/dressing (sponge bathes) and IADLs but reports completing toileting tasks in bathroom with Mod I. Ambulates around home with Mod I and rollator but requires assist for stair negotiation at baseline. 2 nephews split the week to stay with patient. Patient currently functioning below baseline demonstrating observed ADLs including LB dressing and grooming with set-up to Max A. Min to Mod A required for functional transfers with use of RW. Patient also limited by deficits listed below including generalized weakness, decreased activity tolerance, BUE/BLE tremors, and decreased balance and would benefit from continued acute OT services in prep for safe d/c home with 24hr supervision/assist from family and [facility] therapies.     Follow Up Recommendations  Home health OT;Supervision/Assistance - 24 hour    Equipment Recommendations  Other (comment) (TBD)    Recommendations for Other Services       Precautions / Restrictions Precautions Precautions: Fall Precaution Comments: HOH; monitor vitals; erythema and drainage at posterior aspect of head from fall Restrictions Weight Bearing Restrictions: No      Mobility Bed Mobility Overal bed mobility: Needs Assistance Bed Mobility: Supine to Sit     Supine to sit: Mod assist     General bed mobility  comments: Mod A to elevate trunk; requires increased time/effort.    Transfers Overall transfer level: Needs assistance Equipment used: Rolling walker (2 wheeled) Transfers: Sit to/from [facility] Sit to Stand: Min assist;Mod assist Stand pivot transfers: Min assist       General transfer comment: Heavy Min A for sit to stand from elevated EOB with cues for hand placement. Increasd time to rise with noted BLE/BUE tremors. Min A for stand-pivot to recliner on R with use of RW and increased time.    Balance Overall balance assessment: Needs assistance Sitting-balance support: Single extremity supported;Bilateral upper extremity supported;Feet supported Sitting balance-Leahy Scale: Fair Sitting balance - Comments: Maintains static sitting balance at EOB without external assist.   Standing balance support: Bilateral upper extremity supported;During functional activity Standing balance-Leahy Scale: Poor Standing balance comment: Reliant on BUE and external assist for dynamic standing balance.                           ADL either performed or assessed with clinical judgement   ADL Overall ADL's : Needs assistance/impaired     Grooming: Set up;Sitting           Upper Body Dressing : Minimal assistance Upper Body Dressing Details (indicate cue type and reason): Donned posterior hospital gown seated EOB.     Toilet Transfer: Minimal Insurance claims handler Details (indicate cue type and reason): Simulated with transfer to recliner with use of RW. Noted BLE tremors throughout. Patient reports this is new.                 Vision Baseline Vision/History: 1 Wears  glasses Ability to See in Adequate Light: 0 Adequate Patient Visual Report: No change from baseline Vision Assessment?: No apparent visual deficits     Perception     Praxis      Pertinent Vitals/Pain Pain Assessment: No/denies pain Pain Intervention(s): Monitored during  session     Hand Dominance Right   Extremity/Trunk Assessment Upper Extremity Assessment Upper Extremity Assessment: RUE deficits/detail;LUE deficits/detail RUE Deficits / Details: Limited AROM at shoulder joint; followed by outpatient ortho for conservative management LUE Deficits / Details: Limited AROM at shoulder joint   Lower Extremity Assessment Lower Extremity Assessment: Defer to PT evaluation   Cervical / Trunk Assessment Cervical / Trunk Assessment: Kyphotic   Communication Communication Communication: HOH   Cognition Arousal/Alertness: Awake/alert Behavior During Therapy: WFL for tasks assessed/performed Overall Cognitive Status: Within Functional Limits for tasks assessed                                 General Comments: A&Ox4; able to provide PLOF and home set-up without difficulty.   General Comments  HR in 50's througout. SpO2 88-93% on 2L O2 via [place].    Exercises     Shoulder Instructions      Home Living Family/patient expects to be discharged to:: Private residence Living Arrangements: Other relatives;Other (Comment) (2 nephews split the week providing 24hr supervision/assist) Available Help at Discharge: Family;Available 24 hours/day (PCA through [place] 7 days/wk for 1 hr.) Type of Home: House Home Access: Stairs to enter [employer] of Steps: 4-5 Entrance Stairs-Rails: Right;Left (Has been requiring HHA for stair negotiation) Home Layout: One level     Bathroom Shower/Tub:  (Sponge bathes)   Bathroom Toilet: Standard Bathroom Accessibility: Yes How Accessible: Accessible via walker Home Equipment: [place] - 4 wheels;Cane - single point;Bedside commode;Shower seat;Electric scooter Patent attorney is broken)          Prior Functioning/Environment Level of Independence: Needs assistance  Gait / Transfers Assistance Needed: Assist with stair negotiation; ambulates in home with use of RW and no external assist. ADL's /  Homemaking Assistance Needed: Assist with bathing/dressing; was completing toileting/hygiene/clothing management without external assist. Communication / Swallowing Assistance Needed: HOH with bilateral hearing aides          OT Problem List: Decreased strength;Decreased range of motion;Decreased activity tolerance;Impaired balance (sitting and/or standing);Decreased knowledge of use of DME or AE;Cardiopulmonary status limiting activity      OT Treatment/Interventions: Self-care/ADL training;Therapeutic exercise;Energy conservation;DME and/or AE instruction;Therapeutic activities;Patient/family education;Balance training    OT Goals(Current goals can be found in the care plan section) Acute Rehab OT Goals Patient Stated Goal: To return home with family. OT Goal Formulation: With patient/family Time For Goal Achievement: 08/17/21 Potential to Achieve Goals: Good ADL Goals Pt Will Perform Grooming: with min guard assist;standing Pt Will Perform Upper Body Dressing: with min assist;sitting Pt Will Perform Lower Body Dressing: sit to/from stand;with min assist Pt Will Transfer to Toilet: with min guard assist;ambulating Pt Will Perform Toileting - Clothing Manipulation and hygiene: with min assist;sit to/from stand Pt/caregiver will Perform Home Exercise Program: Increased ROM;Increased strength;Both right and left upper extremity Additional ADL Goal #1: Patient will tolerate 15 minutes of therapeutic activity with SpO2 >90% on 2L indicating increased activity tolerance in prep for ADLs.  OT Frequency: Min 3X/week   Barriers to D/C: Inaccessible home environment  Several steps to enter with bilateral rails. Unable to reach both at the same time.  Co-evaluation              AM-PAC OT "6 Clicks" Daily Activity     Outcome Measure Help from another person eating meals?: A Little Help from another person taking care of personal grooming?: A Little Help from another person  toileting, which includes using toliet, bedpan, or urinal?: A Lot Help from another person bathing (including washing, rinsing, drying)?: A Lot Help from another person to put on and taking off regular upper body clothing?: A Little Help from another person to put on and taking off regular lower body clothing?: A Lot 6 Click Score: 15   End of Session Equipment Utilized During Treatment: Gait belt;Rolling walker Nurse Communication: Mobility status  Activity Tolerance: Patient tolerated treatment well;Patient limited by fatigue Patient left: in chair;with call bell/phone within reach;with chair alarm set;with family/visitor present  OT Visit Diagnosis: Unsteadiness on feet (R26.81);Muscle weakness (generalized) (M62.81);History of falling (Z91.81)                Time: 2761-4709 OT Time Calculation (min): 27 min Charges:  OT General Charges $OT Visit: 1 Visit OT Evaluation $OT Eval Moderate Complexity: 1 Mod OT Treatments $Therapeutic Activity: 8-22 mins  Arjuna Doeden H. OTR/L Supplemental OT, Department of rehab services [phone]  Natascha Edmonds R H. 08/03/2021, 11:23 AM

## 2021-08-03 NOTE — Consult Note (Signed)
Cardiology Consultation:  Patient ID: Lonnie [name] MRN: [id number]; DOB: March 04, 1925  Admit date: 08/02/2021 Date of Consult: 08/03/2021  Primary Care Provider: Prince Solian, MD Primary Cardiologist: None  Primary Electrophysiologist:  None   Patient Profile:  Lonnie [name] is a 87 y.o. male with a hx of ESRD on hemodialysis, chronic hypoxic respiratory failure on 4 L supplemental oxygen, COPD, hypertension, hypothyroidism who was admitted on 08/02/2021 after mechanical fall.  Cardiology was consulted for the evaluation of congestive heart failure at the request of Lonnie Lund, MD.  History of Present Illness:  Lonnie [name] presents with generalized weakness and mechanical fall.  He was in his normal state of health until roughly 2 days ago.  Apparently it is required more time for him to get in and out of the car.  He is also walking less frequently due to falls.  Apparently while walking outside he became rather weak and lost his balance.  He reports he fell to the floor.  He did hit his head but did not lose consciousness per what is reported.  He is currently alert and oriented to person place time and situation.  He informs me he lives alone.  He has nephew to alternate taking care of him.  He denies any chest pain or trouble breathing.  On arrival to the emergency room he was noted to be normotensive.  Kidney function consistent with ESRD.  He is not anemic.  He was noted to have a BNP greater than 45,000.  Troponin was elevated to 79 and flat.  CT head negative for any acute intracranial process.  He does have chronic microvascular ischemic changes.  EKG demonstrates atrial fibrillation with slow ventricular response with right bundle branch block and left anterior fascicular block indicative of conduction disease.  An echocardiogram was obtained that shows severely reduced biventricular function.  Left ventricular ejection fraction 20% with global hypokinesis.  Elevated RVSP.  He has a  tricuspid aortic valve that is severely stenotic with what is interpreted as at least moderate aortic stenosis.  Dimensionless index more consistent with severe aortic stenosis.  At the time of my interview he reports no symptoms.  He reports he is breathing comfortably.  He is on his home oxygen requirement.  He denies any chest pain or trouble breathing.  No syncope or lightheadedness is reported.  This appears to have been a mechanical fall per his report.  Heart Pathway Score:       Past Medical History: Past Medical History:  Diagnosis Date   Adenomatous polyps    Bursitis    left hip   Chronic kidney disease    Stage 4   Frequent urination at night    GERD (gastroesophageal reflux disease)    Hypertension    Hypothyroidism    Pneumonia    Polyclonal gammopathy determined by serum protein electrophoresis 10/04/2015    Past Surgical History: Past Surgical History:  Procedure Laterality Date   AORTIC ARCH ANGIOGRAPHY N/A 08/01/2017   Procedure: Aortic Arch Angiography;  Surgeon: Conrad Oronoco, MD;  Location: [facility];  Service: Cardiovascular;  Laterality: N/A;   AV FISTULA PLACEMENT Left 07/10/2016   Procedure: LEFT BRACHIOCEPHALIC ARTERIOVENOUS (AV) FISTULA CREATION;  Surgeon: Conrad Hanover, MD;  Location: [facility];  Service: Vascular;  Laterality: Left;   COLONOSCOPY     RENAL BIOPSY Left 10/06/2015   REVISON OF ARTERIOVENOUS FISTULA Left 93/26/7124   Procedure: PLICATION OF LEFT BRACHIOCEPHALIC ARTERIOVENOUS FISTULA;  Surgeon: Aaron Edelman  Starlyn Skeans, MD;  Location: [facility];  Service: Vascular;  Laterality: Left;   SKIN SURGERY     back   UPPER EXTREMITY ANGIOGRAPHY Left 08/01/2017   Procedure: Upper Extremity Angiography;  Surgeon: Conrad Easton, MD;  Location: [facility];  Service: Cardiovascular;  Laterality: Left;     Home Medications:  Prior to Admission medications   Medication Sig Start Date End Date Taking? Authorizing Provider  amLODipine (NORVASC) 5 MG tablet  Take 5 mg by mouth at bedtime.  03/23/14  Yes [provider]  calcium acetate (PHOSLO) 667 MG capsule Take 1 capsule by mouth 3 (three) times daily with meals.  04/29/17  Yes [provider]  Cyanocobalamin (VITAMIN B-12 PO) Take 1 tablet by mouth daily.   Yes [provider]  escitalopram (LEXAPRO) 10 MG tablet Take 10 mg by mouth at bedtime.  06/14/16  Yes [provider]  levothyroxine (SYNTHROID, LEVOTHROID) 88 MCG tablet Take 88 mcg by mouth at bedtime.   Yes [provider]  mirabegron ER (MYRBETRIQ) 50 MG TB24 tablet Take 50 mg by mouth at bedtime.    Yes [provider]  multivitamin (RENA-VIT) TABS tablet Take 1 tablet by mouth daily.   Yes [provider]  pantoprazole (PROTONIX) 20 MG tablet Take 1 tablet (20 mg total) by mouth daily. 04/10/21  Yes Lamptey, Myrene Galas, MD  acetaminophen (TYLENOL) 325 MG tablet Take 2 tablets (650 mg total) by mouth every 6 (six) hours as needed for mild pain. 01/26/21   Hans Eden, NP  diclofenac Sodium (VOLTAREN) 1 % GEL Apply 4 g topically 4 (four) times daily. 04/10/21   Lamptey, Myrene Galas, MD  diphenhydramine-acetaminophen (TYLENOL PM) 25-500 MG TABS tablet Take 1 tablet by mouth at bedtime as needed (sleep).    [provider]  lidocaine-prilocaine (EMLA) cream Apply 1 application topically daily as needed (port access).  02/06/17   [provider]  polyethylene glycol (MIRALAX) packet Take 17 g by mouth daily. 12/30/18   Robyn Haber, MD  polyvinyl alcohol (LIQUIFILM TEARS) 1.4 % ophthalmic solution Place 1-2 drops into both eyes as needed for dry eyes.    [provider]    Inpatient Medications: Scheduled Meds:  aspirin  81 mg Oral Daily   calcium acetate  667 mg Oral TID WC   carvedilol  3.125 mg Oral BID WC   [START ON 08/04/2021] Chlorhexidine Gluconate Cloth  6 each Topical Q0600   escitalopram  10 mg Oral QHS   heparin  5,000 Units Subcutaneous Q8H    levothyroxine  88 mcg Oral QAC breakfast   mouth rinse  15 mL Mouth Rinse BID   mirabegron ER  50 mg Oral QHS   pantoprazole  20 mg Oral Daily   polyethylene glycol  17 g Oral Daily   sodium chloride flush  3 mL Intravenous Q12H   Continuous Infusions:  [START ON 08/04/2021] iron sucrose     PRN Meds: acetaminophen **OR** acetaminophen, albuterol, ondansetron **OR** ondansetron (ZOFRAN) IV, polyvinyl alcohol, senna-docusate  Allergies:    No Known Allergies  Social History:   Social History   Socioeconomic History   Marital status: Widowed    Spouse name: Not on file   Number of children: Not on file   Years of education: Not on file   Highest education level: Not on file  Occupational History   Not on file  Tobacco Use   Smoking status: Former    Types:  Cigarettes    Quit date: 01/25/1980    Years since quitting: 41.5   Smokeless tobacco: Never  Vaping Use   Vaping Use: Never used  Substance and Sexual Activity   Alcohol use: No    Comment: former alcoholic 32 years ago was last drink   Drug use: No   Sexual activity: Not on file  Other Topics Concern   Not on file  Social History Narrative   Not on file   Social Determinants of Health   Financial Resource Strain: Not on file  Food Insecurity: Not on file  Transportation Needs: Not on file  Physical Activity: Not on file  Stress: Not on file  Social Connections: Not on file  Intimate Partner Violence: Not on file     Family History:    Family History  Problem Relation Age of Onset   Diabetes Father    Diabetes Brother    Heart disease Brother    Colon cancer Neg Hx    Rectal cancer Neg Hx      ROS:  All other ROS reviewed and negative. Pertinent positives noted in the HPI.     Physical Exam/Data:   Vitals:   08/03/21 0400 08/03/21 0500 08/03/21 0805 08/03/21 1201  BP: 125/61  (!) 143/64 138/77  Pulse: (!) 56  (!) 51 (!) 59  Resp: 18  20 18   Temp: 98.9 F (37.2 C)  98.7 F (37.1 C) 98.8 F  (37.1 C)  TempSrc: Oral  Oral Oral  SpO2: 96%  95% 94%  Weight:  65.3 kg    Height:        Intake/Output Summary (Last 24 hours) at 08/03/2021 1609 Last data filed at 08/03/2021 1200 Gross per 24 hour  Intake 400 ml  Output --  Net 400 ml    Last 3 Weights 08/03/2021 08/02/2021 08/02/2021  Weight (lbs) 143 lb 15.4 oz 143 lb 11.8 oz 154 lb 1.6 oz  Weight (kg) 65.3 kg 65.2 kg 69.9 kg    Body mass index is 21.89 kg/m.  General: Well nourished, well developed, in no acute distress Head: Atraumatic, normal size  Eyes: PEERLA, EOMI  Neck: Supple, no JVD Endocrine: No thryomegaly Cardiac: Normal S1, S2; irregular rhythm, 3 out of 6 systolic ejection murmur that is late peaking Lungs: Clear to auscultation bilaterally, no wheezing, rhonchi or rales  Abd: Soft, nontender, no hepatomegaly  Ext: No edema, pulses 2+ Musculoskeletal: No deformities, BUE and BLE strength normal and equal Skin: Warm and dry, no rashes   Neuro: Alert and oriented to person, place, time, and situation, CNII-XII grossly intact, no focal deficits  Psych: Normal mood and affect   EKG:  The EKG was personally reviewed and demonstrates: Atrial fibrillation heart rate 71, right bundle branch block with left anterior fascicular block Telemetry:  Telemetry was personally reviewed and demonstrates: Atrial fibrillation heart rate in the 50s  Relevant CV Studies: TTE 08/03/2021  1. Left ventricular ejection fraction by 3D volume is 20 %. The left  ventricle has severely decreased function. The left ventricle demonstrates  global hypokinesis. Left ventricular diastolic parameters are consistent  with Grade II diastolic dysfunction  (pseudonormalization).   2. Right ventricular systolic function is severely reduced. The right  ventricular size is mildly enlarged. There is moderately elevated  pulmonary artery systolic pressure. The estimated right ventricular  systolic pressure is 35.0 mmHg.   3. Left atrial size was mildly  dilated.   4. The mitral valve is normal in structure.  Moderate mitral valve  regurgitation. No evidence of mitral stenosis.   5. Tricuspid valve regurgitation is moderate.   6. The aortic valve is tricuspid. Aortic valve regurgitation is not  visualized. Moderate aortic valve stenosis. Aortic valve area, by VTI  measures 1.21 cm. Aortic valve mean gradient measures 19.0 mmHg. Aortic  valve Vmax measures 2.76 m/s.   7. The inferior vena cava is normal in size with greater than 50%  respiratory variability, suggesting right atrial pressure of 3 mmHg.   Laboratory Data: High Sensitivity Troponin:   Recent Labs  Lab 08/02/21 1134 08/02/21 1307  TROPONINIHS 279* 258*     Cardiac EnzymesNo results for input(s): TROPONINI in the last 168 hours. No results for input(s): TROPIPOC in the last 168 hours.  Chemistry Recent Labs  Lab 08/02/21 1134 08/03/21 0343  NA 136 133*  K 4.3 4.5  CL 92* 93*  CO2 32 26  GLUCOSE 99 95  BUN 53* 65*  CREATININE 5.08* 5.73*  CALCIUM 9.8 8.7*  GFRNONAA 10* 9*  ANIONGAP 12 14    No results for input(s): PROT, ALBUMIN, AST, ALT, ALKPHOS, BILITOT in the last 168 hours. Hematology Recent Labs  Lab 08/02/21 1134 08/03/21 0343  WBC 6.4 6.9  RBC 4.01* 3.57*  HGB 11.2* 10.3*  HCT 37.4* 33.1*  MCV 93.3 92.7  MCH 27.9 28.9  MCHC 29.9* 31.1  RDW 19.4* 19.4*  PLT 259 239   BNP Recent Labs  Lab 08/02/21 1134  BNP >4,500.0*    DDimer No results for input(s): DDIMER in the last 168 hours.  Radiology/Studies:  DG Chest 2 View  Result Date: 08/02/2021 CLINICAL DATA:  Fall.  Backwards fall EXAM: CHEST - 2 VIEW COMPARISON:  3242 FINDINGS: Twenty normal cardiac silhouette. There is a moderate RIGHT effusion. Small LEFT effusion. No pneumothorax. No fracture. IMPRESSION: 1. Bilateral effusions.  Moderate RIGHT effusion. 2. No pneumothorax or fracture identified. Electronically Signed   By: Suzy Bouchard M.D.   On: 08/02/2021 12:41   CT HEAD WO  CONTRAST (5MM)  Result Date: 08/02/2021 CLINICAL DATA:  Head trauma, minor (Age >= 65y) posterior head injury, fall, 3 days ago EXAM: CT HEAD WITHOUT CONTRAST TECHNIQUE: Contiguous axial images were obtained from the base of the skull through the vertex without intravenous contrast. COMPARISON:  11/12/2012 FINDINGS: Brain: No evidence of acute infarction, hemorrhage, hydrocephalus, extra-axial collection or mass lesion/mass effect. Scattered low-density changes within the periventricular and subcortical white matter compatible with chronic microvascular ischemic change. Mild diffuse cerebral volume loss. Vascular: Atherosclerotic calcifications involving the large vessels of the skull base. No unexpected hyperdense vessel. Skull: Normal. Negative for fracture or focal lesion. Sinuses/Orbits: No acute finding. Other: Soft tissue swelling with ill-defined hematoma in the parieto-occipital scalp. IMPRESSION: 1. No acute intracranial findings. 2. Soft tissue swelling with ill-defined hematoma in the parieto-occipital scalp. No underlying calvarial fracture. 3. Chronic microvascular ischemic change and cerebral volume loss. Electronically Signed   By: Davina Poke D.O.   On: 08/02/2021 12:29   ECHOCARDIOGRAM COMPLETE  Result Date: 08/03/2021    ECHOCARDIOGRAM REPORT   Patient Name:   Lonnie [name] Date of Exam: 08/03/2021 Medical Rec #:  [id number]       Height:       68.0 in Accession #:    [id number]      Weight:       144.0 lb Date of Birth:  October 26, 1925       BSA:  1.777 m Patient Age:    41 years        BP:           143/64 mmHg Patient Gender: M               HR:           58 bpm. Exam Location:  Inpatient Procedure: 2D Echo, 3D Echo, Cardiac Doppler and Color Doppler Indications:    Elevated Troponin  History:        Patient has no prior history of Echocardiogram examinations.                 COPD; Risk Factors:Former Smoker and Hypertension. Generalized                 weakness/mechanical fall at  home. Chronic kidney disease,                 Chronic respiratory failure with hypoxia, thyroid disease.  Sonographer:    Darlina Sicilian RDCS Referring Phys: [id number] [place]  1. Left ventricular ejection fraction by 3D volume is 20 %. The left ventricle has severely decreased function. The left ventricle demonstrates global hypokinesis. Left ventricular diastolic parameters are consistent with Grade II diastolic dysfunction (pseudonormalization).  2. Right ventricular systolic function is severely reduced. The right ventricular size is mildly enlarged. There is moderately elevated pulmonary artery systolic pressure. The estimated right ventricular systolic pressure is 61.4 mmHg.  3. Left atrial size was mildly dilated.  4. The mitral valve is normal in structure. Moderate mitral valve regurgitation. No evidence of mitral stenosis.  5. Tricuspid valve regurgitation is moderate.  6. The aortic valve is tricuspid. Aortic valve regurgitation is not visualized. Moderate aortic valve stenosis. Aortic valve area, by VTI measures 1.21 cm. Aortic valve mean gradient measures 19.0 mmHg. Aortic valve Vmax measures 2.76 m/s.  7. The inferior vena cava is normal in size with greater than 50% respiratory variability, suggesting right atrial pressure of 3 mmHg. FINDINGS  Left Ventricle: Left ventricular ejection fraction by 3D volume is 20 %. The left ventricle has severely decreased function. The left ventricle demonstrates global hypokinesis. The left ventricular internal cavity size was normal in size. There is no left ventricular hypertrophy. Left ventricular diastolic parameters are consistent with Grade II diastolic dysfunction (pseudonormalization). Right Ventricle: The right ventricular size is mildly enlarged. No increase in right ventricular wall thickness. Right ventricular systolic function is severely reduced. There is moderately elevated pulmonary artery systolic pressure. The tricuspid  regurgitant velocity is 3.13 m/s, and with an assumed right atrial pressure of 8 mmHg, the estimated right ventricular systolic pressure is 43.1 mmHg. Left Atrium: Left atrial size was mildly dilated. Right Atrium: Right atrial size was normal in size. Pericardium: There is no evidence of pericardial effusion. Mitral Valve: The mitral valve is normal in structure. Moderate mitral valve regurgitation. No evidence of mitral valve stenosis. Tricuspid Valve: The tricuspid valve is normal in structure. Tricuspid valve regurgitation is moderate . No evidence of tricuspid stenosis. Aortic Valve: The aortic valve is tricuspid. Aortic valve regurgitation is not visualized. Moderate aortic stenosis is present. Aortic valve mean gradient measures 19.0 mmHg. Aortic valve peak gradient measures 30.4 mmHg. Aortic valve area, by VTI measures 1.21 cm. Pulmonic Valve: The pulmonic valve was normal in structure. Pulmonic valve regurgitation is trivial. No evidence of pulmonic stenosis. Aorta: The aortic root is normal in size and structure. Venous: The inferior vena cava is normal in size  with greater than 50% respiratory variability, suggesting right atrial pressure of 3 mmHg. IAS/Shunts: No atrial level shunt detected by color flow Doppler.  LEFT VENTRICLE PLAX 2D LVIDd:         5.40 cm         Diastology LVIDs:         4.40 cm         LV e' medial:    4.05 cm/s LV PW:         0.90 cm         LV E/e' medial:  34.5 LV IVS:        0.90 cm         LV e' lateral:   10.30 cm/s LVOT diam:     2.70 cm         LV E/e' lateral: 13.6 LV SV:         85 LV SV Index:   48 LVOT Area:     5.73 cm        3D Volume EF                                LV 3D EF:    Left                                             ventricul                                             ar                                             ejection                                             fraction                                             by 3D                                              volume is                                             20 %.                                 3D Volume EF:  3D EF:        20 %                                LV EDV:       153 ml                                LV ESV:       123 ml                                LV SV:        30 ml RIGHT VENTRICLE RV S prime:     5.96 cm/s TAPSE (M-mode): 1.1 cm LEFT ATRIUM             Index       RIGHT ATRIUM           Index LA diam:        4.20 cm 2.36 cm/m  RA Area:     17.00 cm LA Vol (A2C):   70.3 ml 39.56 ml/m RA Volume:   39.10 ml  22.00 ml/m LA Vol (A4C):   74.1 ml 41.69 ml/m LA Biplane Vol: 73.1 ml 41.13 ml/m  AORTIC VALVE AV Area (Vmax):    1.40 cm AV Area (Vmean):   1.21 cm AV Area (VTI):     1.21 cm AV Vmax:           275.60 cm/s AV Vmean:          208.200 cm/s AV VTI:            0.700 m AV Peak Grad:      30.4 mmHg AV Mean Grad:      19.0 mmHg LVOT Vmax:         67.30 cm/s LVOT Vmean:        43.900 cm/s LVOT VTI:          0.148 m LVOT/AV VTI ratio: 0.21  AORTA Ao Root diam: 3.00 cm Ao Asc diam:  3.80 cm MITRAL VALVE                TRICUSPID VALVE MV Area (PHT): 3.28 cm     TR Peak grad:   39.2 mmHg MV Decel Time: 231 msec     TR Vmax:        313.00 cm/s MV E velocity: 139.67 cm/s MV A velocity: 79.10 cm/s   SHUNTS MV E/A ratio:  1.77         Systemic VTI:  0.15 m                             Systemic Diam: 2.70 cm Candee Furbish MD Electronically signed by Candee Furbish MD Signature Date/Time: 08/03/2021/11:17:51 AM    Final     Assessment and Plan:   #New onset systolic heart failure, EF 20% #Severe RV dysfunction -He presents with a mechanical fall.  Had weakness and fatigue for several days prior to the episode.  Found to have severe biventricular function with moderate to severe aortic stenosis. -He is 87 years of age and on dialysis.  He does suffer from frequent falls.  He is taken care of by his nephews. -He has advanced COPD requiring 4 L home  oxygen. -He is  not a candidate for aggressive cardiovascular care.  He is currently DNR/DNI.  I did discuss this with his niece Lonnie [name] by phone [phone].  They do understand. -He does have evidence of advanced conduction disease with A. fib with SVR.  We will be cautious with beta-blocker but give him a trial of this. -Not a candidate for ACE/ARB/Arni/MRA given ESRD. -We will trial him on hydralazine and Imdur. -Volume status managed by nephrology. -Again I did reiterate that he is not a candidate for aggressive cardiovascular care.  Furthermore he is really not interested in aggressive care.  I think this is reasonable.  #Moderate to severe aortic stenosis -Echo reviewed.  Dimensionless index is consistent with severe aortic stenosis.  Murmur on exam is also consistent with severe aortic stenosis. -He has global LV dysfunction with no evidence of increased wall thickness.  Aortic stenosis clearly could have played a role in this or he has a cardiomyopathy related to ischemic heart disease.  He does have considerable risk factors including 10 years of hemodialysis. -He has suffered a fall recently with significant scalp hematoma.  I believe he is not a good candidate for aggressive cardiovascular care.  See discussion above.  I really do not believe aggressive work-up including TAVR would benefit him.  I believe he is not a good candidate.  I did discuss this with the niece by phone.  She was in agreement. -For now we will pursue a palliative approach. -Would recommend palliative care consultation as he likely is hospice appropriate.   #Afib with SVR #RBBB/LAFB -EKG and telemetry shows he is in atrial fibrillation.  He does have considerable CHA2DS2-VASc score however he was admitted with a fall with significant scalp hematoma.  Apparently falls are very common per discussion with the family.  We have decided to forego anticoagulation due to bleeding risk. -For now we will continue with Coreg and be  cautious with this. -I really feel he is approaching hospice appropriateness.  For questions or updates, please contact [place] Please consult [email or web address] for contact info under   Signed, Lonnie Bells T. Audie Box, MD, [place]  08/03/2021 4:09 PM

## 2021-08-03 NOTE — Plan of Care (Signed)
  Problem: Education: Goal: Knowledge of General Education information will improve Description Including pain rating scale, medication(s)/side effects and non-pharmacologic comfort measures Outcome: Progressing   Problem: Health Behavior/Discharge Planning: Goal: Ability to manage health-related needs will improve Outcome: Progressing   

## 2021-08-03 NOTE — Progress Notes (Addendum)
Lonnie [name] is 87 Y/O male on hemodialysis MWF at [facility]. He has been admitted as observation patient for fatigue and recent fall at home. We will manage HD tomorrow and will consult formally if patient status upgraded to in-patient.   He has dialysis 2nd shift. If discharged in AM, can send to OP HD center for HD but may have transportation issues.   HD orders: [facility] MWF 3.5 hrs 180NRe 400/Autoflow 1.5 66 kg 2.0K/2.0 Ca AVF -No heparin  -Venofer 100 mg IV X 5 doses-3/5 doses given -Venofer 50 mg IV weekly -Mircera 100 mcg IV q 2 weeks (last dose 07/26/2021)   Juanell Fairly [facility] [place] [phone]

## 2021-08-04 LAB — HEPATITIS B SURFACE ANTIGEN: Hepatitis B Surface Ag: NONREACTIVE

## 2021-08-04 LAB — CBC
HCT: 35.2 % — ABNORMAL LOW (ref 39.0–52.0)
Hemoglobin: 10.8 g/dL — ABNORMAL LOW (ref 13.0–17.0)
MCH: 28.5 pg (ref 26.0–34.0)
MCHC: 30.7 g/dL (ref 30.0–36.0)
MCV: 92.9 fL (ref 80.0–100.0)
Platelets: 257 10*3/uL (ref 150–400)
RBC: 3.79 MIL/uL — ABNORMAL LOW (ref 4.22–5.81)
RDW: 19.9 % — ABNORMAL HIGH (ref 11.5–15.5)
WBC: 6.8 10*3/uL (ref 4.0–10.5)
nRBC: 0.3 % — ABNORMAL HIGH (ref 0.0–0.2)

## 2021-08-04 LAB — COMPREHENSIVE METABOLIC PANEL
ALT: 23 U/L (ref 0–44)
AST: 31 U/L (ref 15–41)
Albumin: 2.3 g/dL — ABNORMAL LOW (ref 3.5–5.0)
Alkaline Phosphatase: 397 U/L — ABNORMAL HIGH (ref 38–126)
Anion gap: 15 (ref 5–15)
BUN: 82 mg/dL — ABNORMAL HIGH (ref 8–23)
CO2: 25 mmol/L (ref 22–32)
Calcium: 8.4 mg/dL — ABNORMAL LOW (ref 8.9–10.3)
Chloride: 93 mmol/L — ABNORMAL LOW (ref 98–111)
Creatinine, Ser: 6.48 mg/dL — ABNORMAL HIGH (ref 0.61–1.24)
GFR, Estimated: 7 mL/min — ABNORMAL LOW (ref >60)
Glucose, Bld: 96 mg/dL (ref 70–99)
Potassium: 4.9 mmol/L (ref 3.5–5.1)
Sodium: 133 mmol/L — ABNORMAL LOW (ref 135–145)
Total Bilirubin: 1.3 mg/dL — ABNORMAL HIGH (ref 0.3–1.2)
Total Protein: 6.9 g/dL (ref 6.5–8.1)

## 2021-08-04 LAB — TSH: TSH: 15.435 u[IU]/mL — ABNORMAL HIGH (ref 0.350–4.500)

## 2021-08-04 MED ORDER — LIDOCAINE HCL (PF) 1 % IJ SOLN: 5.0000 mL | INTRAMUSCULAR | Status: DC | PRN

## 2021-08-04 MED ORDER — PENTAFLUOROPROP-TETRAFLUOROETH EX AERO: 1.0000 "application " | INHALATION_SPRAY | CUTANEOUS | Status: DC | PRN

## 2021-08-04 MED ORDER — SODIUM CHLORIDE 0.9 % IV SOLN: 100.0000 mL | INTRAVENOUS | Status: DC | PRN

## 2021-08-04 MED ORDER — LIDOCAINE-PRILOCAINE 2.5-2.5 % EX CREA: 1.0000 "application " | TOPICAL_CREAM | CUTANEOUS | Status: DC | PRN

## 2021-08-04 MED ORDER — LEVOTHYROXINE SODIUM 100 MCG PO TABS
100.0000 ug | ORAL_TABLET | Freq: Every day | ORAL | Status: DC
  Administered 2021-08-05: 100 ug via ORAL
  Filled 2021-08-04: qty 1

## 2021-08-04 MED ORDER — PROSOURCE PLUS PO LIQD
30.0000 mL | Freq: Two times a day (BID) | ORAL | Status: DC
  Administered 2021-08-04 – 2021-08-05 (×2): 30 mL via ORAL
  Filled 2021-08-04 (×3): qty 30

## 2021-08-04 NOTE — TOC Initial Note (Addendum)
Transition of Care [facility]) - Initial/Assessment Note    Patient Details  Name: Lonnie [name] MRN: [id number] Date of Birth: June 22, 1925  Transition of Care [facility]) CM/SW Contact:    Lonnie Halsted, LCSW Phone Number: 08/04/2021, 2:02 PM  Clinical Narrative:                 CSW received consult for possible SNF at time of discharge. CSW spoke with patient's niece to confirm support. She reported that patient has 24/7 care at home and patient does not need to go to a SNF as she promised patient he would never have to do anything at a nursing facility. She reported that he would like home health services if possible. CSW discussed equipment needs and she reported he has a wheelchair, walker, and potty chair at home. CSW confirmed PCP and address. She requests non-emergency ambulance for transport and his caregiver will be at the home expecting him tomorrow.    Expected Discharge Plan: [place] Barriers to Discharge: Continued Medical Work up   Patient Goals and CMS Choice Patient states their goals for this hospitalization and ongoing recovery are:: Return home CMS Medicare.gov Compare Post Acute Care list provided to:: Patient Represenative (must comment) Choice offered to / list presented to : Patient (Niece)  Expected Discharge Plan and Services Expected Discharge Plan: [place] In-house Referral: Clinical Social Work Discharge Planning Services: CM Consult Post Acute Care Choice: [place] arrangements for the past 2 months: Single Family Home                 DME Arranged: N/A         HH Arranged: PT, OT          Prior Living Arrangements/Services Living arrangements for the past 2 months: Single Family Home Lives with:: Other (Comment) (24/7 care giver) Patient language and need for interpreter reviewed:: Yes Do you feel safe going back to the place where you live?: Yes      Need for Family Participation in Patient Care: Yes  (Comment) Care giver support system in place?: Yes (comment) Current home services: DME, Homehealth aide Criminal Activity/Legal Involvement Pertinent to Current Situation/Hospitalization: No - Comment as needed  Activities of Daily Living Home Assistive Devices/Equipment: None ADL Screening (condition at time of admission) Patient's cognitive ability adequate to safely complete daily activities?: Yes Is the patient deaf or have difficulty hearing?: Yes Does the patient have difficulty seeing, even when wearing glasses/contacts?: No Does the patient have difficulty concentrating, remembering, or making decisions?: No Patient able to express need for assistance with ADLs?: Yes Does the patient have difficulty dressing or bathing?: Yes Independently performs ADLs?: No Communication: Independent Dressing (OT): Needs assistance Is this a change from baseline?: Change from baseline, expected to last >3 days Grooming: Needs assistance Is this a change from baseline?: Change from baseline, expected to last >3 days Feeding: Independent Bathing: Needs assistance Is this a change from baseline?: Change from baseline, expected to last >3 days Toileting: Needs assistance Is this a change from baseline?: Change from baseline, expected to last >3days In/Out Bed: Needs assistance Is this a change from baseline?: Change from baseline, expected to last >3 days Walks in Home: Needs assistance Is this a change from baseline?: Change from baseline, expected to last >3 days Does the patient have difficulty walking or climbing stairs?: Yes Weakness of Legs: Both Weakness of Arms/Hands: None  Permission Sought/Granted Permission sought to share  information with : Facility Sport and exercise psychologist, Family Supports Permission granted to share information with : Yes, Verbal Permission Granted  Share Information with NAME: Lonnie [name]  Permission granted to share info w AGENCY: [place] granted to share  info w Relationship: Niece  Permission granted to share info w Contact Information: [phone]  Emotional Assessment Appearance:: Appears stated age Attitude/Demeanor/Rapport: Gracious Affect (typically observed): Accepting, Appropriate Orientation: : Oriented to Self, Oriented to Place, Oriented to  Time, Oriented to Situation Alcohol / Substance Use: Not Applicable Psych Involvement: No (comment)  Admission diagnosis:  Pleural effusion [J90] Elevated troponin [R77.8] Generalized weakness [R53.1] Injury of head, initial encounter [S09.90XA] Fall, initial encounter [W19.XXXA] Other fatigue [R53.83] Patient Active Problem List   Diagnosis Date Noted   Generalized weakness 08/02/2021   Hypothyroidism    Hypertension    Elevated troponin    ESRD on dialysis ([place]) 08/24/2016   Monoclonal gammopathy 10/06/2015   Gammopathy, monoclonal    Polyclonal gammopathy determined by serum protein electrophoresis 10/04/2015   Depression 03/29/2015   Intertrigo 05/30/2014   Proteinuria 05/05/2014   PCP:  Lonnie Solian, MD Pharmacy:   [facility] [place], [place] [address] [place] [address] Phone: [phone] Fax: [phone]  [facility] [place], [place] [address] [place] [address] Phone: [phone] Fax: [phone]  [facility] [address] [place] [address] Phone: [phone] Fax: [phone]     Social Determinants of Health (SDOH) Interventions    Readmission Risk Interventions No flowsheet data found.

## 2021-08-04 NOTE — Progress Notes (Signed)
Cardiology Progress Note  Patient ID: Lonnie [name] MRN: [id number] DOB: 11-26-1925 Date of Encounter: 08/04/2021  Primary Cardiologist: None  Subjective   Chief Complaint: None.  HPI: Remains in A. fib with SVR.  No symptoms.  On dialysis during my exam.  ROS:  All other ROS reviewed and negative. Pertinent positives noted in the HPI.     Inpatient Medications  Scheduled Meds:  (feeding supplement) PROSource Plus  30 mL Oral BID BM   aspirin  81 mg Oral Daily   calcium acetate  667 mg Oral TID WC   carvedilol  3.125 mg Oral BID WC   Chlorhexidine Gluconate Cloth  6 each Topical Q0600   escitalopram  10 mg Oral QHS   heparin  5,000 Units Subcutaneous Q8H   hydrALAZINE  10 mg Oral Q8H   isosorbide mononitrate  15 mg Oral Daily   levothyroxine  100 mcg Oral Q0600   mouth rinse  15 mL Mouth Rinse BID   mirabegron ER  50 mg Oral QHS   pantoprazole  20 mg Oral Daily   polyethylene glycol  17 g Oral Daily   sodium chloride flush  3 mL Intravenous Q12H   Continuous Infusions:  sodium chloride     sodium chloride     iron sucrose     PRN Meds: sodium chloride, sodium chloride, acetaminophen **OR** [DISCONTINUED] acetaminophen, albuterol, lidocaine (PF), lidocaine-prilocaine, [DISCONTINUED] ondansetron **OR** ondansetron (ZOFRAN) IV, pentafluoroprop-tetrafluoroeth, polyvinyl alcohol, senna-docusate   Vital Signs   Vitals:   08/04/21 0847 08/04/21 0900 08/04/21 0915 08/04/21 0930  BP: (!) 112/47 (!) 115/55 114/61 117/62  Pulse: (!) 59 (!) 52 (!) 51 (!) 46  Resp: 17 20 (!) 24 (!) 26  Temp:      TempSrc:      SpO2: 100% 99% 100% 99%  Weight:      Height:        Intake/Output Summary (Last 24 hours) at 08/04/2021 0949 Last data filed at 08/03/2021 1200 Gross per 24 hour  Intake 400 ml  Output --  Net 400 ml   Last 3 Weights 08/04/2021 08/04/2021 08/03/2021  Weight (lbs) 144 lb 2.9 oz 150 lb 5.7 oz 143 lb 15.4 oz  Weight (kg) 65.4 kg 68.2 kg 65.3 kg      Telemetry   Overnight telemetry shows atrial fibrillation heart rate 40-50 bpm, which I personally reviewed.    Physical Exam   Vitals:   08/04/21 0847 08/04/21 0900 08/04/21 0915 08/04/21 0930  BP: (!) 112/47 (!) 115/55 114/61 117/62  Pulse: (!) 59 (!) 52 (!) 51 (!) 46  Resp: 17 20 (!) 24 (!) 26  Temp:      TempSrc:      SpO2: 100% 99% 100% 99%  Weight:      Height:        Intake/Output Summary (Last 24 hours) at 08/04/2021 0949 Last data filed at 08/03/2021 1200 Gross per 24 hour  Intake 400 ml  Output --  Net 400 ml    Last 3 Weights 08/04/2021 08/04/2021 08/03/2021  Weight (lbs) 144 lb 2.9 oz 150 lb 5.7 oz 143 lb 15.4 oz  Weight (kg) 65.4 kg 68.2 kg 65.3 kg    Body mass index is 21.92 kg/m.   General: Well nourished, well developed, in no acute distress Head: Atraumatic, normal size  Eyes: PEERLA, EOMI  Neck: Supple, no JVD Endocrine: No thryomegaly Cardiac: Normal S1, S2; irregular rhythm, harsh loud 3-6 systolic ejection murmur Lungs: Diminished breath sounds  at the lung bases Abd: Soft, nontender, no hepatomegaly  Ext: No edema, pulses 2+ Musculoskeletal: No deformities, BUE and BLE strength normal and equal Skin: Warm and dry, no rashes   Neuro: Alert and oriented to person, place, time, and situation, CNII-XII grossly intact, no focal deficits  Psych: Normal mood and affect   Labs  High Sensitivity Troponin:   Recent Labs  Lab 08/02/21 1134 08/02/21 1307  TROPONINIHS 279* 258*     Cardiac EnzymesNo results for input(s): TROPONINI in the last 168 hours. No results for input(s): TROPIPOC in the last 168 hours.  Chemistry Recent Labs  Lab 08/02/21 1134 08/03/21 0343 08/04/21 0153  NA 136 133* 133*  K 4.3 4.5 4.9  CL 92* 93* 93*  CO2 32 26 25  GLUCOSE 99 95 96  BUN 53* 65* 82*  CREATININE 5.08* 5.73* 6.48*  CALCIUM 9.8 8.7* 8.4*  PROT  --   --  6.9  ALBUMIN  --   --  2.3*  AST  --   --  31  ALT  --   --  23  ALKPHOS  --   --  397*  BILITOT  --   --  1.3*   GFRNONAA 10* 9* 7*  ANIONGAP 12 14 15     Hematology Recent Labs  Lab 08/02/21 1134 08/03/21 0343 08/04/21 0153  WBC 6.4 6.9 6.8  RBC 4.01* 3.57* 3.79*  HGB 11.2* 10.3* 10.8*  HCT 37.4* 33.1* 35.2*  MCV 93.3 92.7 92.9  MCH 27.9 28.9 28.5  MCHC 29.9* 31.1 30.7  RDW 19.4* 19.4* 19.9*  PLT 259 239 257   BNP Recent Labs  Lab 08/02/21 1134  BNP >4,500.0*    DDimer No results for input(s): DDIMER in the last 168 hours.   Radiology  DG Chest 2 View  Result Date: 08/02/2021 CLINICAL DATA:  Fall.  Backwards fall EXAM: CHEST - 2 VIEW COMPARISON:  3242 FINDINGS: Twenty normal cardiac silhouette. There is a moderate RIGHT effusion. Small LEFT effusion. No pneumothorax. No fracture. IMPRESSION: 1. Bilateral effusions.  Moderate RIGHT effusion. 2. No pneumothorax or fracture identified. Electronically Signed   By: Suzy Bouchard M.D.   On: 08/02/2021 12:41   CT HEAD WO CONTRAST (5MM)  Result Date: 08/02/2021 CLINICAL DATA:  Head trauma, minor (Age >= 65y) posterior head injury, fall, 3 days ago EXAM: CT HEAD WITHOUT CONTRAST TECHNIQUE: Contiguous axial images were obtained from the base of the skull through the vertex without intravenous contrast. COMPARISON:  11/12/2012 FINDINGS: Brain: No evidence of acute infarction, hemorrhage, hydrocephalus, extra-axial collection or mass lesion/mass effect. Scattered low-density changes within the periventricular and subcortical white matter compatible with chronic microvascular ischemic change. Mild diffuse cerebral volume loss. Vascular: Atherosclerotic calcifications involving the large vessels of the skull base. No unexpected hyperdense vessel. Skull: Normal. Negative for fracture or focal lesion. Sinuses/Orbits: No acute finding. Other: Soft tissue swelling with ill-defined hematoma in the parieto-occipital scalp. IMPRESSION: 1. No acute intracranial findings. 2. Soft tissue swelling with ill-defined hematoma in the parieto-occipital scalp. No  underlying calvarial fracture. 3. Chronic microvascular ischemic change and cerebral volume loss. Electronically Signed   By: Davina Poke D.O.   On: 08/02/2021 12:29   ECHOCARDIOGRAM COMPLETE  Result Date: 08/03/2021    ECHOCARDIOGRAM REPORT   Patient Name:   Lonnie [name] Date of Exam: 08/03/2021 Medical Rec #:  [id number]       Height:       68.0 in Accession #:    [id number]  Weight:       144.0 lb Date of Birth:  04-07-1925       BSA:          1.777 m Patient Age:    72 years        BP:           143/64 mmHg Patient Gender: M               HR:           58 bpm. Exam Location:  Inpatient Procedure: 2D Echo, 3D Echo, Cardiac Doppler and Color Doppler Indications:    Elevated Troponin  History:        Patient has no prior history of Echocardiogram examinations.                 COPD; Risk Factors:Former Smoker and Hypertension. Generalized                 weakness/mechanical fall at home. Chronic kidney disease,                 Chronic respiratory failure with hypoxia, thyroid disease.  Sonographer:    Darlina Sicilian RDCS Referring Phys: [id number] [place]  1. Left ventricular ejection fraction by 3D volume is 20 %. The left ventricle has severely decreased function. The left ventricle demonstrates global hypokinesis. Left ventricular diastolic parameters are consistent with Grade II diastolic dysfunction (pseudonormalization).  2. Right ventricular systolic function is severely reduced. The right ventricular size is mildly enlarged. There is moderately elevated pulmonary artery systolic pressure. The estimated right ventricular systolic pressure is 19.6 mmHg.  3. Left atrial size was mildly dilated.  4. The mitral valve is normal in structure. Moderate mitral valve regurgitation. No evidence of mitral stenosis.  5. Tricuspid valve regurgitation is moderate.  6. The aortic valve is tricuspid. Aortic valve regurgitation is not visualized. Moderate aortic valve stenosis. Aortic valve area,  by VTI measures 1.21 cm. Aortic valve mean gradient measures 19.0 mmHg. Aortic valve Vmax measures 2.76 m/s.  7. The inferior vena cava is normal in size with greater than 50% respiratory variability, suggesting right atrial pressure of 3 mmHg. FINDINGS  Left Ventricle: Left ventricular ejection fraction by 3D volume is 20 %. The left ventricle has severely decreased function. The left ventricle demonstrates global hypokinesis. The left ventricular internal cavity size was normal in size. There is no left ventricular hypertrophy. Left ventricular diastolic parameters are consistent with Grade II diastolic dysfunction (pseudonormalization). Right Ventricle: The right ventricular size is mildly enlarged. No increase in right ventricular wall thickness. Right ventricular systolic function is severely reduced. There is moderately elevated pulmonary artery systolic pressure. The tricuspid regurgitant velocity is 3.13 m/s, and with an assumed right atrial pressure of 8 mmHg, the estimated right ventricular systolic pressure is 22.2 mmHg. Left Atrium: Left atrial size was mildly dilated. Right Atrium: Right atrial size was normal in size. Pericardium: There is no evidence of pericardial effusion. Mitral Valve: The mitral valve is normal in structure. Moderate mitral valve regurgitation. No evidence of mitral valve stenosis. Tricuspid Valve: The tricuspid valve is normal in structure. Tricuspid valve regurgitation is moderate . No evidence of tricuspid stenosis. Aortic Valve: The aortic valve is tricuspid. Aortic valve regurgitation is not visualized. Moderate aortic stenosis is present. Aortic valve mean gradient measures 19.0 mmHg. Aortic valve peak gradient measures 30.4 mmHg. Aortic valve area, by VTI measures 1.21 cm. Pulmonic Valve: The pulmonic valve was normal in structure.  Pulmonic valve regurgitation is trivial. No evidence of pulmonic stenosis. Aorta: The aortic root is normal in size and structure. Venous: The  inferior vena cava is normal in size with greater than 50% respiratory variability, suggesting right atrial pressure of 3 mmHg. IAS/Shunts: No atrial level shunt detected by color flow Doppler.  LEFT VENTRICLE PLAX 2D LVIDd:         5.40 cm         Diastology LVIDs:         4.40 cm         LV e' medial:    4.05 cm/s LV PW:         0.90 cm         LV E/e' medial:  34.5 LV IVS:        0.90 cm         LV e' lateral:   10.30 cm/s LVOT diam:     2.70 cm         LV E/e' lateral: 13.6 LV SV:         85 LV SV Index:   48 LVOT Area:     5.73 cm        3D Volume EF                                LV 3D EF:    Left                                             ventricul                                             ar                                             ejection                                             fraction                                             by 3D                                             volume is                                             20 %.                                 3D  Volume EF:                                3D EF:        20 %                                LV EDV:       153 ml                                LV ESV:       123 ml                                LV SV:        30 ml RIGHT VENTRICLE RV S prime:     5.96 cm/s TAPSE (M-mode): 1.1 cm LEFT ATRIUM             Index       RIGHT ATRIUM           Index LA diam:        4.20 cm 2.36 cm/m  RA Area:     17.00 cm LA Vol (A2C):   70.3 ml 39.56 ml/m RA Volume:   39.10 ml  22.00 ml/m LA Vol (A4C):   74.1 ml 41.69 ml/m LA Biplane Vol: 73.1 ml 41.13 ml/m  AORTIC VALVE AV Area (Vmax):    1.40 cm AV Area (Vmean):   1.21 cm AV Area (VTI):     1.21 cm AV Vmax:           275.60 cm/s AV Vmean:          208.200 cm/s AV VTI:            0.700 m AV Peak Grad:      30.4 mmHg AV Mean Grad:      19.0 mmHg LVOT Vmax:         67.30 cm/s LVOT Vmean:        43.900 cm/s LVOT VTI:          0.148 m LVOT/AV VTI ratio: 0.21  AORTA Ao Root diam: 3.00 cm Ao Asc  diam:  3.80 cm MITRAL VALVE                TRICUSPID VALVE MV Area (PHT): 3.28 cm     TR Peak grad:   39.2 mmHg MV Decel Time: 231 msec     TR Vmax:        313.00 cm/s MV E velocity: 139.67 cm/s MV A velocity: 79.10 cm/s   SHUNTS MV E/A ratio:  1.77         Systemic VTI:  0.15 m                             Systemic Diam: 2.70 cm Candee Furbish MD Electronically signed by Candee Furbish MD Signature Date/Time: 08/03/2021/11:17:51 AM    Final     Cardiac Studies  TTE 08/03/2021  1. Left ventricular ejection fraction by 3D volume is 20 %. The left  ventricle has severely decreased function. The left ventricle demonstrates  global hypokinesis. Left ventricular diastolic parameters are consistent  with Grade II diastolic dysfunction  (  pseudonormalization).   2. Right ventricular systolic function is severely reduced. The right  ventricular size is mildly enlarged. There is moderately elevated  pulmonary artery systolic pressure. The estimated right ventricular  systolic pressure is 73.2 mmHg.   3. Left atrial size was mildly dilated.   4. The mitral valve is normal in structure. Moderate mitral valve  regurgitation. No evidence of mitral stenosis.   5. Tricuspid valve regurgitation is moderate.   6. The aortic valve is tricuspid. Aortic valve regurgitation is not  visualized. Moderate aortic valve stenosis. Aortic valve area, by VTI  measures 1.21 cm. Aortic valve mean gradient measures 19.0 mmHg. Aortic  valve Vmax measures 2.76 m/s.   7. The inferior vena cava is normal in size with greater than 50%  respiratory variability, suggesting right atrial pressure of 3 mmHg.   Patient Profile  Lonnie [name] is a 87 y.o. male with ESRD on hemodialysis, chronic hypoxic respiratory failure on 4 L home O2, COPD, hypertension, hypothyroidism who was admitted on 08/02/2021 after mechanical fall.  Cardiology was consulted for new onset cardiomyopathy with moderate severe aortic stenosis.  Assessment & Plan    #New onset systolic heart failure, EF 20% #Severe RV dysfunction -Admitted with mechanical fall.  Had severe scalp laceration. -Now with severe biventricular failure. -He is 87 years of age on hemodialysis.  He suffers from frequent falls.  He has advanced COPD on 4 L of oxygen. -Not a candidate for aggressive cardiovascular care. -Currently DNR/DNI.  This was discussed with his niece Fara Boros by phone [phone].  They understand. -Also with significant conduction disease and A. fib with SVR and right bundle branch block and left anterior fascicular block. -I have simply recommended medical management.  I believe aggressive cardiovascular care including TAVR work-up is not indicated in this elderly gentleman with multiple comorbidities. -Continue Coreg 3.125 mg twice daily.  We should be cautious of his conduction disease.  Seems to be tolerating it well. -Not a candidate for ACE/ARB/Arni/MRA.  He has significant CKD and is on dialysis.  Continue hydralazine 10 mg 3 times daily.  Continue Imdur 15 mg daily.  This can be titrated up as needed. -Volume managed by nephrology.  #Moderate to severe AS -I have reviewed his echo.  He has a murmur consistent with severe aortic stenosis.  Dimensionless index also consistent with this. -He has a dilated cardiomyopathy and AAS could have contributed. -However he is not a candidate for aggressive cardiovascular care.  See discussion above.  I discussed this with family. -Would recommend palliative care consultation.  I think he is likely hospice appropriate.  #A. fib with SVR #Right bundle branch block/left anterior fascicular block #Hypothyroidism -With increased dose of Synthroid medication per hospital medicine. -He suffered a fall with scalp hematoma. -After discussion with family we will forego anticoagulation.  He does fall a lot at home. -On Coreg for congestive heart failure.  [facility] will sign off.   Medication  Recommendations: Medical management as above Other recommendations (labs, testing, etc): None Follow up as an outpatient: We will arrange hospital follow-up with [place] in 6 to 8 weeks  For questions or updates, please contact [place] Please consult [email or web address] for contact info under   Time Spent with Patient: I have spent a total of 35 minutes with patient reviewing hospital notes, telemetry, EKGs, labs and examining the patient as well as establishing an assessment and plan that was discussed with the patient.  > 50% of time was spent  in direct patient care.    Signed, Addison Naegeli. Audie Box, MD, [place]  08/04/2021 9:49 AM

## 2021-08-04 NOTE — Progress Notes (Signed)
Occupational Therapy Treatment Patient Details Name: Lonnie [name] MRN: [id number] DOB: [birth year] Today's Date: 08/04/2021    History of present illness 87 y.o. male presenting to ED 8/31 with generalized weakness and falls reporting hitting his head without LOC. CT head (-) for actue findings. CXR (+) moderate [name] pelural effusion and small L effusion. PMHx significant for ESRD on HD WMF, COPD, chronic respiratory failure on 2-4L home O2, HTN, and thyroid disease.   OT comments  Patient met seated in recliner after lunch meal. Discussed PT recommendation for CIR vs SNF rehab given increased need for external assist for mobility with RW. Patient adamantly refusing both CIR and SNF rehab with desire to return home with continued assist from caregiver/family. Patient states caregiver usually assists him onto bus to dialysis but he walks into dialysis center on his own with use of RW. At this time patient is not safe to ambulate without external assist. If patient can be transported in manual wheelchair on and off of dialysis bus he would be safe to return home. Discussed this with patient and case Freight forwarder. OT treatment session with focus on functional transfers and self-care re-education with patient completing 1 standing grooming task at sink level with Min guard. Functional mobility 47f with Min A and use of RW. Session concluded with patient lying supine in bed with call bell within reach and all needs met.   Follow Up Recommendations  Home health OT;Supervision/Assistance - 24 hour    Equipment Recommendations  Wheelchair (measurements OT);Wheelchair cushion (measurements OT)    Recommendations for Other Services      Precautions / Restrictions Precautions Precautions: Fall Precaution Comments: HOH; monitor vitals; erythema and drainage at posterior aspect of head from fall Restrictions Weight Bearing Restrictions: No       Mobility Bed Mobility Overal bed mobility: Needs  Assistance Bed Mobility: Sit to Supine     Supine to sit: Min assist     General bed mobility comments: Min A to advance BLE from EOB to bed surface. Able to control descent of trunk +rails.    Transfers Overall transfer level: Needs assistance Equipment used: Rolling walker (2 wheeled) Transfers: Sit to/from SOmnicareSit to Stand: Min assist;Mod assist Stand pivot transfers: Min assist       General transfer comment: Mod A for sit to stand from low recliner. Increasd time to rise with noted BLE/BUE tremors.    Balance Overall balance assessment: Needs assistance Sitting-balance support: Single extremity supported;Bilateral upper extremity supported;Feet supported Sitting balance-Leahy Scale: Fair Sitting balance - Comments: Maintains static sitting balance at EOB without external assist.   Standing balance support: Bilateral upper extremity supported;During functional activity Standing balance-Leahy Scale: Poor Standing balance comment: Reliant on BUE and external assist for dynamic standing balance.                           ADL either performed or assessed with clinical judgement   ADL Overall ADL's : Needs assistance/impaired Eating/Feeding: Independent   Grooming: Min guard;Standing Grooming Details (indicate cue type and reason): 1/3 grooming tasks standing at sink level with Min guard and at least unilateral UE support on sink surface.         Upper Body Dressing : Minimal assistance Upper Body Dressing Details (indicate cue type and reason): Donned posterior hospital gown seated EOB.     Toilet Transfer: Minimal aInsurance claims handlerDetails (indicate cue type and reason): Simulated with transfer to  recliner with use of RW. Noted BLE tremors throughout. Patient reports this is new.                 Vision   Vision Assessment?: No apparent visual deficits   Perception     Praxis      Cognition  Arousal/Alertness: Awake/alert Behavior During Therapy: WFL for tasks assessed/performed Overall Cognitive Status: Within Functional Limits for tasks assessed                                 General Comments: A&Ox4; able to provide PLOF and home set-up without difficulty.        Exercises     Shoulder Instructions       General Comments HR in 50's througout. SpO2 88-93% on 2L O2 via [place].    Pertinent Vitals/ Pain       Pain Assessment: No/denies pain  Home Living                                          Prior Functioning/Environment              Frequency  Min 3X/week        Progress Toward Goals  OT Goals(current goals can now be found in the care plan section)  Progress towards OT goals: Progressing toward goals  Acute Rehab OT Goals Patient Stated Goal: To return home with family. OT Goal Formulation: With patient/family Time For Goal Achievement: 08/17/21 Potential to Achieve Goals: Good ADL Goals Pt Will Perform Grooming: with min guard assist;standing Pt Will Perform Upper Body Dressing: with min assist;sitting Pt Will Perform Lower Body Dressing: sit to/from stand;with min assist Pt Will Transfer to Toilet: with min guard assist;ambulating Pt Will Perform Toileting - Clothing Manipulation and hygiene: with min assist;sit to/from stand Pt/caregiver will Perform Home Exercise Program: Increased ROM;Increased strength;Both right and left upper extremity Additional ADL Goal #1: Patient will tolerate 15 minutes of therapeutic activity with SpO2 >90% on 2L indicating increased activity tolerance in prep for ADLs.  Plan Discharge plan remains appropriate;Frequency remains appropriate    Co-evaluation                 AM-PAC OT "6 Clicks" Daily Activity     Outcome Measure   Help from another person eating meals?: A Little Help from another person taking care of personal grooming?: A Little Help from another person  toileting, which includes using toliet, bedpan, or urinal?: A Lot Help from another person bathing (including washing, rinsing, drying)?: A Lot Help from another person to put on and taking off regular upper body clothing?: A Little Help from another person to put on and taking off regular lower body clothing?: A Lot 6 Click Score: 15    End of Session Equipment Utilized During Treatment: Gait belt;Rolling walker  OT Visit Diagnosis: Unsteadiness on feet (R26.81);Muscle weakness (generalized) (M62.81);History of falling (Z91.81)   Activity Tolerance Patient tolerated treatment well;Patient limited by fatigue   Patient Left with call bell/phone within reach;in bed;with bed alarm set   Nurse Communication Mobility status        Time: 1333-1401 OT Time Calculation (min): 28 min  Charges: OT General Charges $OT Visit: 1 Visit OT Treatments $Self Care/Home Management : 23-37 mins  Lonnie Debono H. OTR/L Supplemental OT, Department of rehab  services [phone]   Lonnie [name] H. 08/04/2021, 2:59 PM

## 2021-08-04 NOTE — Progress Notes (Signed)
[facility] Progress Note   Subjective:  Seen on HD. BP stable - 1.2L UFG for now. Denies CP or dyspnea.  Objective Vitals:   08/04/21 0052 08/04/21 0400 08/04/21 0500 08/04/21 0745  BP: (!) 127/94     Pulse:    (!) 57  Resp:    (!) 21  Temp: 97.9 F (36.6 C) 97.8 F (36.6 C)    TempSrc: Oral Axillary    SpO2:    100%  Weight:   68.2 kg   Height:       Physical Exam General: Frail man, NAD. Nasal O2 Heart: RRR; 2/6 murmur Lungs: CTA anteriorly Abdomen: soft Extremities: No LE edema Dialysis Access:  AVF + thrill  Additional Objective Labs: Basic Metabolic Panel: Recent Labs  Lab 08/02/21 1134 08/03/21 0343 08/04/21 0153  NA 136 133* 133*  K 4.3 4.5 4.9  CL 92* 93* 93*  CO2 32 26 25  GLUCOSE 99 95 96  BUN 53* 65* 82*  CREATININE 5.08* 5.73* 6.48*  CALCIUM 9.8 8.7* 8.4*   Liver Function Tests: Recent Labs  Lab 08/04/21 0153  AST 31  ALT 23  ALKPHOS 397*  BILITOT 1.3*  PROT 6.9  ALBUMIN 2.3*   CBC: Recent Labs  Lab 08/02/21 1134 08/03/21 0343 08/04/21 0153  WBC 6.4 6.9 6.8  NEUTROABS 4.5  --   --   HGB 11.2* 10.3* 10.8*  HCT 37.4* 33.1* 35.2*  MCV 93.3 92.7 92.9  PLT 259 239 257   Studies/Results: DG Chest 2 View  Result Date: 08/02/2021 CLINICAL DATA:  Fall.  Backwards fall EXAM: CHEST - 2 VIEW COMPARISON:  3242 FINDINGS: Twenty normal cardiac silhouette. There is a moderate RIGHT effusion. Small LEFT effusion. No pneumothorax. No fracture. IMPRESSION: 1. Bilateral effusions.  Moderate RIGHT effusion. 2. No pneumothorax or fracture identified. Electronically Signed   By: Suzy Bouchard M.D.   On: 08/02/2021 12:41   CT HEAD WO CONTRAST (5MM)  Result Date: 08/02/2021 CLINICAL DATA:  Head trauma, minor (Age >= 65y) posterior head injury, fall, 3 days ago EXAM: CT HEAD WITHOUT CONTRAST TECHNIQUE: Contiguous axial images were obtained from the base of the skull through the vertex without intravenous contrast. COMPARISON:   11/12/2012 FINDINGS: Brain: No evidence of acute infarction, hemorrhage, hydrocephalus, extra-axial collection or mass lesion/mass effect. Scattered low-density changes within the periventricular and subcortical white matter compatible with chronic microvascular ischemic change. Mild diffuse cerebral volume loss. Vascular: Atherosclerotic calcifications involving the large vessels of the skull base. No unexpected hyperdense vessel. Skull: Normal. Negative for fracture or focal lesion. Sinuses/Orbits: No acute finding. Other: Soft tissue swelling with ill-defined hematoma in the parieto-occipital scalp. IMPRESSION: 1. No acute intracranial findings. 2. Soft tissue swelling with ill-defined hematoma in the parieto-occipital scalp. No underlying calvarial fracture. 3. Chronic microvascular ischemic change and cerebral volume loss. Electronically Signed   By: Davina Poke D.O.   On: 08/02/2021 12:29   ECHOCARDIOGRAM COMPLETE  Result Date: 08/03/2021    ECHOCARDIOGRAM REPORT   Patient Name:   Lonnie [name] Date of Exam: 08/03/2021 Medical Rec #:  [id number]       Height:       68.0 in Accession #:    [id number]      Weight:       144.0 lb Date of Birth:  10-08-1925       BSA:          1.777 m Patient Age:    87 years  BP:           143/64 mmHg Patient Gender: M               HR:           58 bpm. Exam Location:  Inpatient Procedure: 2D Echo, 3D Echo, Cardiac Doppler and Color Doppler Indications:    Elevated Troponin  History:        Patient has no prior history of Echocardiogram examinations.                 COPD; Risk Factors:Former Smoker and Hypertension. Generalized                 weakness/mechanical fall at home. Chronic kidney disease,                 Chronic respiratory failure with hypoxia, thyroid disease.  Sonographer:    Darlina Sicilian RDCS Referring Phys: [id number]  1. Left ventricular ejection fraction by 3D volume is 20 %. The left ventricle has severely decreased  function. The left ventricle demonstrates global hypokinesis. Left ventricular diastolic parameters are consistent with Grade II diastolic dysfunction (pseudonormalization).  2. Right ventricular systolic function is severely reduced. The right ventricular size is mildly enlarged. There is moderately elevated pulmonary artery systolic pressure. The estimated right ventricular systolic pressure is 81.8 mmHg.  3. Left atrial size was mildly dilated.  4. The mitral valve is normal in structure. Moderate mitral valve regurgitation. No evidence of mitral stenosis.  5. Tricuspid valve regurgitation is moderate.  6. The aortic valve is tricuspid. Aortic valve regurgitation is not visualized. Moderate aortic valve stenosis. Aortic valve area, by VTI measures 1.21 cm. Aortic valve mean gradient measures 19.0 mmHg. Aortic valve Vmax measures 2.76 m/s.  7. The inferior vena cava is normal in size with greater than 50% respiratory variability, suggesting right atrial pressure of 3 mmHg. FINDINGS  Left Ventricle: Left ventricular ejection fraction by 3D volume is 20 %. The left ventricle has severely decreased function. The left ventricle demonstrates global hypokinesis. The left ventricular internal cavity size was normal in size. There is no left ventricular hypertrophy. Left ventricular diastolic parameters are consistent with Grade II diastolic dysfunction (pseudonormalization). Right Ventricle: The right ventricular size is mildly enlarged. No increase in right ventricular wall thickness. Right ventricular systolic function is severely reduced. There is moderately elevated pulmonary artery systolic pressure. The tricuspid regurgitant velocity is 3.13 m/s, and with an assumed right atrial pressure of 8 mmHg, the estimated right ventricular systolic pressure is 29.9 mmHg. Left Atrium: Left atrial size was mildly dilated. Right Atrium: Right atrial size was normal in size. Pericardium: There is no evidence of pericardial  effusion. Mitral Valve: The mitral valve is normal in structure. Moderate mitral valve regurgitation. No evidence of mitral valve stenosis. Tricuspid Valve: The tricuspid valve is normal in structure. Tricuspid valve regurgitation is moderate . No evidence of tricuspid stenosis. Aortic Valve: The aortic valve is tricuspid. Aortic valve regurgitation is not visualized. Moderate aortic stenosis is present. Aortic valve mean gradient measures 19.0 mmHg. Aortic valve peak gradient measures 30.4 mmHg. Aortic valve area, by VTI measures 1.21 cm. Pulmonic Valve: The pulmonic valve was normal in structure. Pulmonic valve regurgitation is trivial. No evidence of pulmonic stenosis. Aorta: The aortic root is normal in size and structure. Venous: The inferior vena cava is normal in size with greater than 50% respiratory variability, suggesting right atrial pressure of 3 mmHg. IAS/Shunts: No atrial  level shunt detected by color flow Doppler.  LEFT VENTRICLE PLAX 2D LVIDd:         5.40 cm         Diastology LVIDs:         4.40 cm         LV e' medial:    4.05 cm/s LV PW:         0.90 cm         LV E/e' medial:  34.5 LV IVS:        0.90 cm         LV e' lateral:   10.30 cm/s LVOT diam:     2.70 cm         LV E/e' lateral: 13.6 LV SV:         85 LV SV Index:   48 LVOT Area:     5.73 cm        3D Volume EF                                LV 3D EF:    Left                                             ventricul                                             ar                                             ejection                                             fraction                                             by 3D                                             volume is                                             20 %.                                 3D Volume EF:                                3D EF:        20 %  LV EDV:       153 ml                                LV ESV:       123 ml                                 LV SV:        30 ml RIGHT VENTRICLE RV S prime:     5.96 cm/s TAPSE (M-mode): 1.1 cm LEFT ATRIUM             Index       RIGHT ATRIUM           Index LA diam:        4.20 cm 2.36 cm/m  RA Area:     17.00 cm LA Vol (A2C):   70.3 ml 39.56 ml/m RA Volume:   39.10 ml  22.00 ml/m LA Vol (A4C):   74.1 ml 41.69 ml/m LA Biplane Vol: 73.1 ml 41.13 ml/m  AORTIC VALVE AV Area (Vmax):    1.40 cm AV Area (Vmean):   1.21 cm AV Area (VTI):     1.21 cm AV Vmax:           275.60 cm/s AV Vmean:          208.200 cm/s AV VTI:            0.700 m AV Peak Grad:      30.4 mmHg AV Mean Grad:      19.0 mmHg LVOT Vmax:         67.30 cm/s LVOT Vmean:        43.900 cm/s LVOT VTI:          0.148 m LVOT/AV VTI ratio: 0.21  AORTA Ao Root diam: 3.00 cm Ao Asc diam:  3.80 cm MITRAL VALVE                TRICUSPID VALVE MV Area (PHT): 3.28 cm     TR Peak grad:   39.2 mmHg MV Decel Time: 231 msec     TR Vmax:        313.00 cm/s MV E velocity: 139.67 cm/s MV A velocity: 79.10 cm/s   SHUNTS MV E/A ratio:  1.77         Systemic VTI:  0.15 m                             Systemic Diam: 2.70 cm Candee Furbish MD Electronically signed by Candee Furbish MD Signature Date/Time: 08/03/2021/11:17:51 AM    Final     Medications:  sodium chloride     sodium chloride     iron sucrose      aspirin  81 mg Oral Daily   calcium acetate  667 mg Oral TID WC   carvedilol  3.125 mg Oral BID WC   Chlorhexidine Gluconate Cloth  6 each Topical Q0600   escitalopram  10 mg Oral QHS   heparin  5,000 Units Subcutaneous Q8H   hydrALAZINE  10 mg Oral Q8H   isosorbide mononitrate  15 mg Oral Daily   levothyroxine  100 mcg Oral Q0600   mouth rinse  15 mL Mouth Rinse BID   mirabegron ER  50 mg Oral QHS   pantoprazole  20 mg Oral Daily   polyethylene glycol  17 g Oral Daily   sodium chloride flush  3 mL Intravenous Q12H    Dialysis Orders: [facility] MWF 3.5 hrs 180NRe 400/Autoflow 1.5 66 kg 2.0K/2.0 Ca AVF -No heparin  -Venofer 100 mg IV X 5 doses-3/5 doses  given -Venofer 50 mg IV weekly -Mircera 100 mcg IV q 2 weeks (last dose 07/26/2021)   Assessment/Plan: New Combined systolic and diastolic HF with EF 56%. G2DD. Bilateral pleural effusions on CXR. Cardiology consulted, conservative measures planned. Manage volume with HD.  Chronic Respiratory Failure/COPD: Management per primary.   ESRD -  HD today, per usual MWF schedule. HD now.  Hypertension/volume  - BP controlled. Amlodipine changed to carvedilol per primary. Left under EDW last HD. Lower volume as tolerated.   Anemia  - Hgb 10.8. Recent ESA and Fe Load. Follow.   Metabolic bone disease - Ca ok, continue binders.  Nutrition - Alb low, adding protein supplements.    Veneta Penton, PA-C 08/04/2021, 8:39 AM  [employer]

## 2021-08-04 NOTE — Plan of Care (Signed)
  Problem: Education: Goal: Knowledge of General Education information will improve Description Including pain rating scale, medication(s)/side effects and non-pharmacologic comfort measures Outcome: Progressing   Problem: Health Behavior/Discharge Planning: Goal: Ability to manage health-related needs will improve Outcome: Progressing   

## 2021-08-04 NOTE — Progress Notes (Signed)
F/u arranged and placed on AVS.

## 2021-08-04 NOTE — Progress Notes (Addendum)
OT Cancellation Note  Patient Details Name: LJ MIYAMOTO MRN: [id number] DOB: 1925/02/23   Cancelled Treatment:    Reason Eval/Treat Not Completed: Patient at procedure or test/ unavailable. Off floor for HD. OT to check back as time allows.   [place] on patient a second time. Patient eating lunch with request for therapy to return at a later time.  Gloris Manchester OTR/L Supplemental OT, Department of rehab services [phone]  Rondo Spittler R H. 08/04/2021, 9:44 AM

## 2021-08-04 NOTE — Progress Notes (Signed)
[facility] ROUNDING NOTE   Subjective:   Interval History: Is a 87 year old gentleman with history end-stage renal disease Monday Wednesday Friday dialysis [facility].  He has a history of hypertension COPD chronic respiratory failure on home oxygen therapy.  He has an ejection fraction about 20% with global hypokinesis.  This is a new finding and cardiology been consulted.  He is receiving dialysis 08/04/2021.  Blood pressure 111/49 pulse 46 temperature 97.8 O2 sats 1% nasal cannula  Sodium 133 potassium 4.9 chloride 93 CO2 25 BUN 82 creatinine 6.4 glucose 96 albumin 2.3 hemoglobin 10.8  Patient receiving dialysis 08/04/2021    Objective:  Vital signs in last 24 hours:  Temp:  [97.8 F (36.6 C)-98.8 F (37.1 C)] 97.8 F (36.6 C) (09/02 0400) Pulse Rate:  [57-61] 57 (09/02 0745) Resp:  [16-21] 21 (09/02 0745) BP: (117-138)/(58-94) 127/94 (09/02 0052) SpO2:  [94 %-100 %] 100 % (09/02 0745) Weight:  [68.2 kg] 68.2 kg (09/02 0500)  Weight change: -1.7 kg Filed Weights   08/02/21 1728 08/03/21 0500 08/04/21 0500  Weight: 65.2 kg 65.3 kg 68.2 kg    Intake/Output: I/O last 3 completed shifts: In: 400 [P.O.:400] Out: -    Intake/Output this shift:  No intake/output data recorded.  CVS- RRR no murmurs rubs gallops RS- CTA no wheeze or rales ABD- BS present soft non-distended EXT- no edema   Basic Metabolic Panel: Recent Labs  Lab 08/02/21 1134 08/03/21 0343 08/04/21 0153  NA 136 133* 133*  K 4.3 4.5 4.9  CL 92* 93* 93*  CO2 32 26 25  GLUCOSE 99 95 96  BUN 53* 65* 82*  CREATININE 5.08* 5.73* 6.48*  CALCIUM 9.8 8.7* 8.4*    Liver Function Tests: Recent Labs  Lab 08/04/21 0153  AST 31  ALT 23  ALKPHOS 397*  BILITOT 1.3*  PROT 6.9  ALBUMIN 2.3*   No results for input(s): LIPASE, AMYLASE in the last 168 hours. No results for input(s): AMMONIA in the last 168 hours.  CBC: Recent Labs  Lab 08/02/21 1134 08/03/21 0343  08/04/21 0153  WBC 6.4 6.9 6.8  NEUTROABS 4.5  --   --   HGB 11.2* 10.3* 10.8*  HCT 37.4* 33.1* 35.2*  MCV 93.3 92.7 92.9  PLT 259 239 257    Cardiac Enzymes: No results for input(s): CKTOTAL, CKMB, CKMBINDEX, TROPONINI in the last 168 hours.  BNP: Invalid input(s): POCBNP  CBG: No results for input(s): GLUCAP in the last 168 hours.  Microbiology: Results for orders placed or performed during the hospital encounter of 08/02/21  Resp Panel by RT-PCR (Flu A&B, Covid) Nasopharyngeal Swab     Status: None   Collection Time: 08/02/21 11:25 AM   Specimen: Nasopharyngeal Swab; Nasopharyngeal(NP) swabs in vial transport medium  Result Value Ref Range Status   SARS Coronavirus 2 by RT PCR NEGATIVE NEGATIVE Final    Comment: (NOTE) SARS-CoV-2 target nucleic acids are NOT DETECTED.  The SARS-CoV-2 RNA is generally detectable in upper respiratory specimens during the acute phase of infection. The lowest concentration of SARS-CoV-2 viral copies this assay can detect is 138 copies/mL. A negative result does not preclude SARS-Cov-2 infection and should not be used as the sole basis for treatment or other patient management decisions. A negative result may occur with  improper specimen collection/handling, submission of specimen other than nasopharyngeal swab, presence of viral mutation(s) within the areas targeted by this assay, and inadequate number of viral copies(<138 copies/mL). A negative result must  be combined with clinical observations, patient history, and epidemiological information. The expected result is Negative.  Fact Sheet for Patients:  EntrepreneurPulse.com.au  Fact Sheet for Healthcare Providers:  IncredibleEmployment.be  This test is no t yet approved or cleared by the [place] FDA and  has been authorized for detection and/or diagnosis of SARS-CoV-2 by FDA under an Emergency Use Authorization (EUA). This EUA will remain   in effect (meaning this test can be used) for the duration of the COVID-19 declaration under Section 564(b)(1) of the Act, 21 U.S.C.section 360bbb-3(b)(1), unless the authorization is terminated  or revoked sooner.       Influenza A by PCR NEGATIVE NEGATIVE Final   Influenza B by PCR NEGATIVE NEGATIVE Final    Comment: (NOTE) The Xpert Xpress SARS-CoV-2/FLU/RSV plus assay is intended as an aid in the diagnosis of influenza from Nasopharyngeal swab specimens and should not be used as a sole basis for treatment. Nasal washings and aspirates are unacceptable for Xpert Xpress SARS-CoV-2/FLU/RSV testing.  Fact Sheet for Patients: EntrepreneurPulse.com.au  Fact Sheet for Healthcare Providers: IncredibleEmployment.be  This test is not yet approved or cleared by the [place] FDA and has been authorized for detection and/or diagnosis of SARS-CoV-2 by FDA under an Emergency Use Authorization (EUA). This EUA will remain in effect (meaning this test can be used) for the duration of the COVID-19 declaration under Section 564(b)(1) of the Act, 21 U.S.C. section 360bbb-3(b)(1), unless the authorization is terminated or revoked.  Performed at [facility], [address], [place], [place] [address]     Coagulation Studies: No results for input(s): LABPROT, INR in the last 72 hours.  Urinalysis: No results for input(s): COLORURINE, LABSPEC, PHURINE, GLUCOSEU, HGBUR, BILIRUBINUR, KETONESUR, PROTEINUR, UROBILINOGEN, NITRITE, LEUKOCYTESUR in the last 72 hours.  Invalid input(s): APPERANCEUR    Imaging: DG Chest 2 View  Result Date: 08/02/2021 CLINICAL DATA:  Fall.  Backwards fall EXAM: CHEST - 2 VIEW COMPARISON:  3242 FINDINGS: Twenty normal cardiac silhouette. There is a moderate RIGHT effusion. Small LEFT effusion. No pneumothorax. No fracture. IMPRESSION: 1. Bilateral effusions.  Moderate RIGHT effusion. 2. No pneumothorax or  fracture identified. Electronically Signed   By: Suzy Bouchard M.D.   On: 08/02/2021 12:41   CT HEAD WO CONTRAST (5MM)  Result Date: 08/02/2021 CLINICAL DATA:  Head trauma, minor (Age >= 65y) posterior head injury, fall, 3 days ago EXAM: CT HEAD WITHOUT CONTRAST TECHNIQUE: Contiguous axial images were obtained from the base of the skull through the vertex without intravenous contrast. COMPARISON:  11/12/2012 FINDINGS: Brain: No evidence of acute infarction, hemorrhage, hydrocephalus, extra-axial collection or mass lesion/mass effect. Scattered low-density changes within the periventricular and subcortical white matter compatible with chronic microvascular ischemic change. Mild diffuse cerebral volume loss. Vascular: Atherosclerotic calcifications involving the large vessels of the skull base. No unexpected hyperdense vessel. Skull: Normal. Negative for fracture or focal lesion. Sinuses/Orbits: No acute finding. Other: Soft tissue swelling with ill-defined hematoma in the parieto-occipital scalp. IMPRESSION: 1. No acute intracranial findings. 2. Soft tissue swelling with ill-defined hematoma in the parieto-occipital scalp. No underlying calvarial fracture. 3. Chronic microvascular ischemic change and cerebral volume loss. Electronically Signed   By: Davina Poke D.O.   On: 08/02/2021 12:29   ECHOCARDIOGRAM COMPLETE  Result Date: 08/03/2021    ECHOCARDIOGRAM REPORT   Patient Name:   ABDIMALIK MAYORQUIN Date of Exam: 08/03/2021 Medical Rec #:  [id number]       Height:       68.0 in  Accession #:    [id number]      Weight:       144.0 lb Date of Birth:  1925-10-10       BSA:          1.777 m Patient Age:    89 years        BP:           143/64 mmHg Patient Gender: M               HR:           58 bpm. Exam Location:  Inpatient Procedure: 2D Echo, 3D Echo, Cardiac Doppler and Color Doppler Indications:    Elevated Troponin  History:        Patient has no prior history of Echocardiogram examinations.                  COPD; Risk Factors:Former Smoker and Hypertension. Generalized                 weakness/mechanical fall at home. Chronic kidney disease,                 Chronic respiratory failure with hypoxia, thyroid disease.  Sonographer:    Darlina Sicilian RDCS Referring Phys: [id number] [place]  1. Left ventricular ejection fraction by 3D volume is 20 %. The left ventricle has severely decreased function. The left ventricle demonstrates global hypokinesis. Left ventricular diastolic parameters are consistent with Grade II diastolic dysfunction (pseudonormalization).  2. Right ventricular systolic function is severely reduced. The right ventricular size is mildly enlarged. There is moderately elevated pulmonary artery systolic pressure. The estimated right ventricular systolic pressure is 62.7 mmHg.  3. Left atrial size was mildly dilated.  4. The mitral valve is normal in structure. Moderate mitral valve regurgitation. No evidence of mitral stenosis.  5. Tricuspid valve regurgitation is moderate.  6. The aortic valve is tricuspid. Aortic valve regurgitation is not visualized. Moderate aortic valve stenosis. Aortic valve area, by VTI measures 1.21 cm. Aortic valve mean gradient measures 19.0 mmHg. Aortic valve Vmax measures 2.76 m/s.  7. The inferior vena cava is normal in size with greater than 50% respiratory variability, suggesting right atrial pressure of 3 mmHg. FINDINGS  Left Ventricle: Left ventricular ejection fraction by 3D volume is 20 %. The left ventricle has severely decreased function. The left ventricle demonstrates global hypokinesis. The left ventricular internal cavity size was normal in size. There is no left ventricular hypertrophy. Left ventricular diastolic parameters are consistent with Grade II diastolic dysfunction (pseudonormalization). Right Ventricle: The right ventricular size is mildly enlarged. No increase in right ventricular wall thickness. Right ventricular systolic function is  severely reduced. There is moderately elevated pulmonary artery systolic pressure. The tricuspid regurgitant velocity is 3.13 m/s, and with an assumed right atrial pressure of 8 mmHg, the estimated right ventricular systolic pressure is 03.5 mmHg. Left Atrium: Left atrial size was mildly dilated. Right Atrium: Right atrial size was normal in size. Pericardium: There is no evidence of pericardial effusion. Mitral Valve: The mitral valve is normal in structure. Moderate mitral valve regurgitation. No evidence of mitral valve stenosis. Tricuspid Valve: The tricuspid valve is normal in structure. Tricuspid valve regurgitation is moderate . No evidence of tricuspid stenosis. Aortic Valve: The aortic valve is tricuspid. Aortic valve regurgitation is not visualized. Moderate aortic stenosis is present. Aortic valve mean gradient measures 19.0 mmHg. Aortic valve peak gradient measures 30.4 mmHg. Aortic valve area, by VTI  measures 1.21 cm. Pulmonic Valve: The pulmonic valve was normal in structure. Pulmonic valve regurgitation is trivial. No evidence of pulmonic stenosis. Aorta: The aortic root is normal in size and structure. Venous: The inferior vena cava is normal in size with greater than 50% respiratory variability, suggesting right atrial pressure of 3 mmHg. IAS/Shunts: No atrial level shunt detected by color flow Doppler.  LEFT VENTRICLE PLAX 2D LVIDd:         5.40 cm         Diastology LVIDs:         4.40 cm         LV e' medial:    4.05 cm/s LV PW:         0.90 cm         LV E/e' medial:  34.5 LV IVS:        0.90 cm         LV e' lateral:   10.30 cm/s LVOT diam:     2.70 cm         LV E/e' lateral: 13.6 LV SV:         85 LV SV Index:   48 LVOT Area:     5.73 cm        3D Volume EF                                LV 3D EF:    Left                                             ventricul                                             ar                                             ejection                                              fraction                                             by 3D                                             volume is                                             20 %.  3D Volume EF:                                3D EF:        20 %                                LV EDV:       153 ml                                LV ESV:       123 ml                                LV SV:        30 ml RIGHT VENTRICLE RV S prime:     5.96 cm/s TAPSE (M-mode): 1.1 cm LEFT ATRIUM             Index       RIGHT ATRIUM           Index LA diam:        4.20 cm 2.36 cm/m  RA Area:     17.00 cm LA Vol (A2C):   70.3 ml 39.56 ml/m RA Volume:   39.10 ml  22.00 ml/m LA Vol (A4C):   74.1 ml 41.69 ml/m LA Biplane Vol: 73.1 ml 41.13 ml/m  AORTIC VALVE AV Area (Vmax):    1.40 cm AV Area (Vmean):   1.21 cm AV Area (VTI):     1.21 cm AV Vmax:           275.60 cm/s AV Vmean:          208.200 cm/s AV VTI:            0.700 m AV Peak Grad:      30.4 mmHg AV Mean Grad:      19.0 mmHg LVOT Vmax:         67.30 cm/s LVOT Vmean:        43.900 cm/s LVOT VTI:          0.148 m LVOT/AV VTI ratio: 0.21  AORTA Ao Root diam: 3.00 cm Ao Asc diam:  3.80 cm MITRAL VALVE                TRICUSPID VALVE MV Area (PHT): 3.28 cm     TR Peak grad:   39.2 mmHg MV Decel Time: 231 msec     TR Vmax:        313.00 cm/s MV E velocity: 139.67 cm/s MV A velocity: 79.10 cm/s   SHUNTS MV E/A ratio:  1.77         Systemic VTI:  0.15 m                             Systemic Diam: 2.70 cm Candee Furbish MD Electronically signed by Candee Furbish MD Signature Date/Time: 08/03/2021/11:17:51 AM    Final      Medications:    sodium chloride     sodium chloride     iron sucrose      aspirin  81 mg Oral Daily   calcium acetate  667 mg Oral TID WC   carvedilol  3.125  mg Oral BID WC   Chlorhexidine Gluconate Cloth  6 each Topical Q0600   escitalopram  10 mg Oral QHS   heparin  5,000 Units Subcutaneous Q8H   hydrALAZINE  10 mg Oral Q8H   isosorbide  mononitrate  15 mg Oral Daily   levothyroxine  100 mcg Oral Q0600   mouth rinse  15 mL Mouth Rinse BID   mirabegron ER  50 mg Oral QHS   pantoprazole  20 mg Oral Daily   polyethylene glycol  17 g Oral Daily   sodium chloride flush  3 mL Intravenous Q12H   sodium chloride, sodium chloride, acetaminophen **OR** [DISCONTINUED] acetaminophen, albuterol, lidocaine (PF), lidocaine-prilocaine, [DISCONTINUED] ondansetron **OR** ondansetron (ZOFRAN) IV, pentafluoroprop-tetrafluoroeth, polyvinyl alcohol, senna-docusate  Assessment/ Plan:   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 G2DD. Bilateral pleural effusions on CXR. Cardiology will be consulted. Manage volume with HD.  Chronic Respiratory Failure/COPD: Management per primary.   ESRD -  MWF next HD 08/04/2021.   Hypertension/volume  - BP controlled. Amlodipine changed to carvedilol per primary. Left under EDW last HD 08/. Lower volume as tolerated.   Anemia  - HGB at goal. Recent ESA and Fe Load. Follow HGB.   Metabolic bone disease - OP labs at goal. Continue binders.  Nutrition - Renal diet. Nepro.     LOS: [place] @TODAY @8 :42 AM

## 2021-08-04 NOTE — TOC Progression Note (Signed)
Transition of Care [facility]) - Progression Note    Patient Details  Name: Lonnie [name] MRN: [id number] Date of Birth: 05/28/1925  [facility]) CM/SW Contact  Lonnie Grayer Beverely Pace, RN Phone Number: 08/04/2021, 2:51 PM  Clinical Narrative:   Case manager spoke with patient concerning discharge needs, he asked that I contact his niece, Lonnie [name] [phone]. CM spoke with Lonnie [name] concerning need for Home Health services, she had no preference for [facility] agency, referral called to Lonnie [name], Liaison with [facility]. Lonnie [name] states that patient lives alone, but has private care givers. He has oxygen, RW, wheelchair and 3in1 at home. No further needs identified.     Expected Discharge Plan: [place] Barriers to Discharge: Continued Medical Work up  Expected Discharge Plan and Services Expected Discharge Plan: [place] In-house Referral: Clinical Social Work Discharge Planning Services: CM Consult Post Acute Care Choice: [place] arrangements for the past 2 months: Single Family Home                 DME Arranged: N/A         HH Arranged: PT, OT HH Agency: [place] Date Lonnie [name]: 08/04/21 Time [place]: 1433 Representative spoke with at [place]: [place] Interventions    Readmission Risk Interventions No flowsheet data found.

## 2021-08-04 NOTE — Progress Notes (Signed)
Pt off floor to dialysis

## 2021-08-04 NOTE — Progress Notes (Signed)
Physical Therapy Treatment Patient Details Name: Lonnie [name] MRN: [id number] DOB: 12/11/1924 Today's Date: 08/04/2021    History of Present Illness 87 y.o. male presenting to ED 8/31 with generalized weakness and falls reporting hitting his head without LOC. CT head (-) for actue findings. CXR (+) moderate R pelural effusion and small L effusion. PMHx significant for ESRD on HD WMF, COPD, chronic respiratory failure on 2-4L home O2, HTN, and thyroid disease.    PT Comments    Pt making progress today.  He still required min-mod A for transfers but was able to ambulate 73' with rollator and min A.  Pt's vitals fairly stable during session (O2 at lowest 88%).  Pt and family have adamantly refused SNF.  OT has coordinating with family and case management - they have DME and are aware of increased need for assistance. Continue to progress as able.     Follow Up Recommendations  Other (comment) (Recommend CIR or SNF but pt/family adamantly refuse; if home will need 24 hr support/assist and HHPT)     Equipment Recommendations  Other (comment) (has DME including w/c)    Recommendations for Other Services       Precautions / Restrictions Precautions Precautions: Fall Precaution Comments: HOH; monitor vitals; erythema and drainage at posterior aspect of head from fall Restrictions Weight Bearing Restrictions: No    Mobility  Bed Mobility Overal bed mobility: Needs Assistance Bed Mobility: Sit to Supine     Supine to sit: Min assist     General bed mobility comments: Min A to scoot to EOB and lift trunk    Transfers Overall transfer level: Needs assistance Equipment used: 4-wheeled walker Transfers: Sit to/from Stand Sit to Stand: Mod assist Stand pivot transfers: Min assist       General transfer comment: Performed x 2 with mod A to rise and cues to get completely upright before walking.  Ambulation/Gait Ambulation/Gait assistance: Min assist Gait Distance (Feet): 40  Feet (40' then 12') Assistive device: 4-wheeled walker Gait Pattern/deviations: Step-to pattern;Decreased stride length;Trunk flexed Gait velocity: decreased   General Gait Details: Cues for posture and rollator use. Min A to steady, fatigued easily but good self monitoring. Ambulated 40' seated rest break then 12' to recliner.   Stairs             Wheelchair Mobility    Modified Rankin (Stroke Patients Only)       Balance Overall balance assessment: Needs assistance Sitting-balance support: No upper extremity supported Sitting balance-Leahy Scale: Good Sitting balance - Comments: Maintains static sitting balance at EOB without external assist.   Standing balance support: Bilateral upper extremity supported Standing balance-Leahy Scale: Poor Standing balance comment: Requiring bil UE and mod A initially progressing to min A, tendency to posteriorly lean.                            Cognition Arousal/Alertness: Awake/alert Behavior During Therapy: WFL for tasks assessed/performed Overall Cognitive Status: Within Functional Limits for tasks assessed                                 General Comments: A&Ox4; able to provide PLOF and home set-up without difficulty.      Exercises      General Comments General comments (skin integrity, edema, etc.): Pt on 2 L O2 with sats 88-93% during session.  Pertinent Vitals/Pain Pain Assessment: No/denies pain    Home Living                      Prior Function            PT Goals (current goals can now be found in the care plan section) Acute Rehab PT Goals Patient Stated Goal: To return home with family. Progress towards PT goals: Progressing toward goals    Frequency    Min 3X/week      PT Plan Discharge plan needs to be updated;Frequency needs to be updated    Co-evaluation              AM-PAC PT "6 Clicks" Mobility   Outcome Measure  Help needed turning from  your back to your side while in a flat bed without using bedrails?: None Help needed moving from lying on your back to sitting on the side of a flat bed without using bedrails?: A Little Help needed moving to and from a bed to a chair (including a wheelchair)?: A Little Help needed standing up from a chair using your arms (e.g., wheelchair or bedside chair)?: A Lot Help needed to walk in hospital room?: A Little Help needed climbing 3-5 steps with a railing? : A Lot 6 Click Score: 17    End of Session Equipment Utilized During Treatment: Gait belt;Oxygen Activity Tolerance: Patient tolerated treatment well Patient left: with chair alarm set;in chair;with call bell/phone within reach Nurse Communication: Mobility status PT Visit Diagnosis: History of falling (Z91.81);Muscle weakness (generalized) (M62.81)     Time: 9179-1505 PT Time Calculation (min) (ACUTE ONLY): 20 min  Charges:  $Gait Training: 8-22 mins                     Abran Richard, PT Acute Rehab Services Pager [phone] [facility] [place] 08/04/2021, 3:47 PM

## 2021-08-04 NOTE — Progress Notes (Signed)
PROGRESS NOTE                                                                                                                                                                                                             Patient Demographics:    Lonnie [name], is a 87 y.o. male, DOB - [birth year], NID:[id number]  Outpatient Primary MD for the patient is Avva, Ravisankar, MD    LOS - 1  Admit date - 08/02/2021    Chief Complaint  Patient presents with   Fall       Brief Narrative (HPI from H&P)     Lonnie [name] is a 87 y.o. male with medical history significant for ESRD on MWF HD, COPD, chronic respiratory failure with hypoxia on 4 L supplemental O2 via [place], HTN, hypothyroidism who presented to the ED for evaluation of fatigue and recent fall at home.    Subjective:   Patient in bed, appears comfortable, denies any headache, no fever, no chest pain or pressure, no shortness of breath , no abdominal pain. No new focal weakness.    Assessment  & Plan :   Generalized weakness/fall at home: he has been feeling weak and fatigued for several days, had a fall at home.  Did not lose consciousness.  Head CT was negative.  No focal deficits.    New found chronic combined diastolic and systolic heart failure with EF 20% with severe AS - Mild troponin elevation 279, downtrending on repeat.  Denies any recent chest pain.  EKG shows bifascicular block, similar to prior. EF 20%, +ve WMA with Moderate - severe AS -seen by cardiology not a candidate for invasive procedures, medical management continued, recommendations are for patient hospice follow-up, discussed with patient and patient's niece plan is to discharge home in the next 1 to 2 days with home social work and PT Therapist, sports, once he declines social work to initiate hospice.  I think he has a few weeks before hospice can be initiated.  ESRD on MWF HD: Reports last HD 8/29.  Nephrology  following on MWF schedule.   COPD/chronic respiratory failure with hypoxia: Chronic and stable.  Continue supplemental oxygen and albuterol as needed.   Hypertension: On Coreg along with Imdur combination monitor.   Hypothyroidism: TSH was elevated and Synthroid dose has been increased.  Condition -   Guarded  Family Communication  : Niece Lonnie [name] [phone] on 08/03/2021, 08/04/2021  Code Status :  DNR  Consults  :  Renal, Cards  PUD Prophylaxis : PPI   Procedures  :     TEE -  1. Left ventricular ejection fraction by 3D volume is 20 %. The left ventricle has severely decreased function. The left ventricle demonstrates global hypokinesis. Left ventricular diastolic parameters are consistent with Grade II diastolic dysfunction (pseudonormalization).  2. Right ventricular systolic function is severely reduced. The right ventricular size is mildly enlarged. There is moderately elevated pulmonary artery systolic pressure. The estimated right ventricular systolic pressure is 19.4 mmHg.  3. Left atrial size was mildly dilated.  4. The mitral valve is normal in structure. Moderate mitral valve regurgitation. No evidence of mitral stenosis.  5. Tricuspid valve regurgitation is moderate.  6. The aortic valve is tricuspid. Aortic valve regurgitation is not visualized. Moderate aortic valve stenosis. Aortic valve area, by VTI measures 1.21 cm. Aortic valve mean gradient measures 19.0 mmHg. Aortic valve Vmax measures 2.76 m/s.  7. The inferior vena cava is normal in size with greater than 50% respiratory variability, suggesting right atrial pressure of 3 mmHg.       Disposition Plan  :    Status is: Observation    Dispo: The patient is from: Home              Anticipated d/c is to: Home              Patient currently is not medically stable to d/c.  Likely discharge home on 08/05/2021   Difficult to place patient No   DVT Prophylaxis  :    heparin injection 5,000 Units Start: 08/03/21  0600    Lab Results  Component Value Date   PLT 257 08/04/2021    Diet :  Diet Order             Diet renal with fluid restriction Fluid restriction: 1200 mL Fluid; Room service appropriate? Yes; Fluid consistency: Thin  Diet effective now                    Inpatient Medications  Scheduled Meds:  (feeding supplement) PROSource Plus  30 mL Oral BID BM   aspirin  81 mg Oral Daily   calcium acetate  667 mg Oral TID WC   carvedilol  3.125 mg Oral BID WC   Chlorhexidine Gluconate Cloth  6 each Topical Q0600   escitalopram  10 mg Oral QHS   heparin  5,000 Units Subcutaneous Q8H   hydrALAZINE  10 mg Oral Q8H   isosorbide mononitrate  15 mg Oral Daily   levothyroxine  100 mcg Oral Q0600   mouth rinse  15 mL Mouth Rinse BID   mirabegron ER  50 mg Oral QHS   pantoprazole  20 mg Oral Daily   polyethylene glycol  17 g Oral Daily   sodium chloride flush  3 mL Intravenous Q12H   Continuous Infusions:  iron sucrose Stopped (08/04/21 1226)   PRN Meds:.acetaminophen **OR** [DISCONTINUED] acetaminophen, albuterol, [DISCONTINUED] ondansetron **OR** ondansetron (ZOFRAN) IV, polyvinyl alcohol, senna-docusate  Antibiotics  :    Anti-infectives (From admission, onward)    None        Time Spent in minutes  30   Lala Lund M.D on 08/04/2021 at 12:46 PM  To page go to [email or web address]   [facility] -  Office  [phone]  See all Orders from today for further details    Objective:   Vitals:   08/04/21 1030 08/04/21 1045 08/04/21 1100 08/04/21 1217  BP: (!) 121/47 (!) 112/48 (!) 118/45 130/70  Pulse: 65 (!) 54 (!) 49 60  Resp: (!) 32 (!) 26 (!) 23 20  Temp:    97.7 F (36.5 C)  TempSrc:    Oral  SpO2: 100% 100% 100% 98%  Weight:      Height:        Wt Readings from Last 3 Encounters:  08/04/21 65.4 kg  06/22/21 69.9 kg  11/26/19 69.9 kg    No intake or output data in the 24 hours ending 08/04/21 1246   Physical Exam  Awake Alert, No new F.N  deficits, Normal affect [place].AT,PERRAL Supple Neck,No JVD, No cervical lymphadenopathy appriciated.  Symmetrical Chest wall movement, Good air movement bilaterally, CTAB RRR,No Gallops, loud systolic murmur +ve B.Sounds, Abd Soft, No tenderness, No organomegaly appriciated, No rebound - guarding or rigidity. No Cyanosis, Clubbing or edema, No new Rash or bruise    Data Review:    CBC Recent Labs  Lab 08/02/21 1134 08/03/21 0343 08/04/21 0153  WBC 6.4 6.9 6.8  HGB 11.2* 10.3* 10.8*  HCT 37.4* 33.1* 35.2*  PLT 259 239 257  MCV 93.3 92.7 92.9  MCH 27.9 28.9 28.5  MCHC 29.9* 31.1 30.7  RDW 19.4* 19.4* 19.9*  LYMPHSABS 0.6*  --   --   MONOABS 0.8  --   --   EOSABS 0.4  --   --   BASOSABS 0.1  --   --     Recent Labs  Lab 08/02/21 1134 08/03/21 0343 08/04/21 0153  NA 136 133* 133*  K 4.3 4.5 4.9  CL 92* 93* 93*  CO2 32 26 25  GLUCOSE 99 95 96  BUN 53* 65* 82*  CREATININE 5.08* 5.73* 6.48*  CALCIUM 9.8 8.7* 8.4*  AST  --   --  31  ALT  --   --  23  ALKPHOS  --   --  397*  BILITOT  --   --  1.3*  ALBUMIN  --   --  2.3*  TSH  --   --  15.435*  BNP >4,500.0*  --   --     ------------------------------------------------------------------------------------------------------------------ No results for input(s): CHOL, HDL, LDLCALC, TRIG, CHOLHDL, LDLDIRECT in the last 72 hours.  No results found for: HGBA1C ------------------------------------------------------------------------------------------------------------------ Recent Labs    08/04/21 0153  TSH 15.435*    Cardiac Enzymes No results for input(s): CKMB, TROPONINI, MYOGLOBIN in the last 168 hours.  Invalid input(s): CK ------------------------------------------------------------------------------------------------------------------    Component Value Date/Time   BNP >4,500.0 (H) 08/02/2021 1134      Radiology Reports DG Chest 2 View  Result Date: 08/02/2021 CLINICAL DATA:  Fall.  Backwards fall  EXAM: CHEST - 2 VIEW COMPARISON:  3242 FINDINGS: Twenty normal cardiac silhouette. There is a moderate RIGHT effusion. Small LEFT effusion. No pneumothorax. No fracture. IMPRESSION: 1. Bilateral effusions.  Moderate RIGHT effusion. 2. No pneumothorax or fracture identified. Electronically Signed   By: Suzy Bouchard M.D.   On: 08/02/2021 12:41   CT HEAD WO CONTRAST (5MM)  Result Date: 08/02/2021 CLINICAL DATA:  Head trauma, minor (Age >= 65y) posterior head injury, fall, 3 days ago EXAM: CT HEAD WITHOUT CONTRAST TECHNIQUE: Contiguous axial images were obtained from the base of the skull through the vertex without intravenous contrast. COMPARISON:  11/12/2012 FINDINGS: Brain: No evidence of acute  infarction, hemorrhage, hydrocephalus, extra-axial collection or mass lesion/mass effect. Scattered low-density changes within the periventricular and subcortical white matter compatible with chronic microvascular ischemic change. Mild diffuse cerebral volume loss. Vascular: Atherosclerotic calcifications involving the large vessels of the skull base. No unexpected hyperdense vessel. Skull: Normal. Negative for fracture or focal lesion. Sinuses/Orbits: No acute finding. Other: Soft tissue swelling with ill-defined hematoma in the parieto-occipital scalp. IMPRESSION: 1. No acute intracranial findings. 2. Soft tissue swelling with ill-defined hematoma in the parieto-occipital scalp. No underlying calvarial fracture. 3. Chronic microvascular ischemic change and cerebral volume loss. Electronically Signed   By: Davina Poke D.O.   On: 08/02/2021 12:29   ECHOCARDIOGRAM COMPLETE  Result Date: 08/03/2021    ECHOCARDIOGRAM REPORT   Patient Name:   SHAFER SWAMY Date of Exam: 08/03/2021 Medical Rec #:  [id number]       Height:       68.0 in Accession #:    [id number]      Weight:       144.0 lb Date of Birth:  January 06, 1925       BSA:          1.777 m Patient Age:    54 years        BP:           143/64 mmHg Patient Gender:  M               HR:           58 bpm. Exam Location:  Inpatient Procedure: 2D Echo, 3D Echo, Cardiac Doppler and Color Doppler Indications:    Elevated Troponin  History:        Patient has no prior history of Echocardiogram examinations.                 COPD; Risk Factors:Former Smoker and Hypertension. Generalized                 weakness/mechanical fall at home. Chronic kidney disease,                 Chronic respiratory failure with hypoxia, thyroid disease.  Sonographer:    Darlina Sicilian RDCS Referring Phys: [id number] Paulina  1. Left ventricular ejection fraction by 3D volume is 20 %. The left ventricle has severely decreased function. The left ventricle demonstrates global hypokinesis. Left ventricular diastolic parameters are consistent with Grade II diastolic dysfunction (pseudonormalization).  2. Right ventricular systolic function is severely reduced. The right ventricular size is mildly enlarged. There is moderately elevated pulmonary artery systolic pressure. The estimated right ventricular systolic pressure is 99.8 mmHg.  3. Left atrial size was mildly dilated.  4. The mitral valve is normal in structure. Moderate mitral valve regurgitation. No evidence of mitral stenosis.  5. Tricuspid valve regurgitation is moderate.  6. The aortic valve is tricuspid. Aortic valve regurgitation is not visualized. Moderate aortic valve stenosis. Aortic valve area, by VTI measures 1.21 cm. Aortic valve mean gradient measures 19.0 mmHg. Aortic valve Vmax measures 2.76 m/s.  7. The inferior vena cava is normal in size with greater than 50% respiratory variability, suggesting right atrial pressure of 3 mmHg. FINDINGS  Left Ventricle: Left ventricular ejection fraction by 3D volume is 20 %. The left ventricle has severely decreased function. The left ventricle demonstrates global hypokinesis. The left ventricular internal cavity size was normal in size. There is no left ventricular hypertrophy. Left  ventricular diastolic parameters are consistent with Grade II diastolic dysfunction (pseudonormalization).  Right Ventricle: The right ventricular size is mildly enlarged. No increase in right ventricular wall thickness. Right ventricular systolic function is severely reduced. There is moderately elevated pulmonary artery systolic pressure. The tricuspid regurgitant velocity is 3.13 m/s, and with an assumed right atrial pressure of 8 mmHg, the estimated right ventricular systolic pressure is 74.2 mmHg. Left Atrium: Left atrial size was mildly dilated. Right Atrium: Right atrial size was normal in size. Pericardium: There is no evidence of pericardial effusion. Mitral Valve: The mitral valve is normal in structure. Moderate mitral valve regurgitation. No evidence of mitral valve stenosis. Tricuspid Valve: The tricuspid valve is normal in structure. Tricuspid valve regurgitation is moderate . No evidence of tricuspid stenosis. Aortic Valve: The aortic valve is tricuspid. Aortic valve regurgitation is not visualized. Moderate aortic stenosis is present. Aortic valve mean gradient measures 19.0 mmHg. Aortic valve peak gradient measures 30.4 mmHg. Aortic valve area, by VTI measures 1.21 cm. Pulmonic Valve: The pulmonic valve was normal in structure. Pulmonic valve regurgitation is trivial. No evidence of pulmonic stenosis. Aorta: The aortic root is normal in size and structure. Venous: The inferior vena cava is normal in size with greater than 50% respiratory variability, suggesting right atrial pressure of 3 mmHg. IAS/Shunts: No atrial level shunt detected by color flow Doppler.  LEFT VENTRICLE PLAX 2D LVIDd:         5.40 cm         Diastology LVIDs:         4.40 cm         LV e' medial:    4.05 cm/s LV PW:         0.90 cm         LV E/e' medial:  34.5 LV IVS:        0.90 cm         LV e' lateral:   10.30 cm/s LVOT diam:     2.70 cm         LV E/e' lateral: 13.6 LV SV:         85 LV SV Index:   48 LVOT Area:     5.73  cm        3D Volume EF                                LV 3D EF:    Left                                             ventricul                                             ar                                             ejection                                             fraction  by 3D                                             volume is                                             20 %.                                 3D Volume EF:                                3D EF:        20 %                                LV EDV:       153 ml                                LV ESV:       123 ml                                LV SV:        30 ml RIGHT VENTRICLE RV S prime:     5.96 cm/s TAPSE (M-mode): 1.1 cm LEFT ATRIUM             Index       RIGHT ATRIUM           Index LA diam:        4.20 cm 2.36 cm/m  RA Area:     17.00 cm LA Vol (A2C):   70.3 ml 39.56 ml/m RA Volume:   39.10 ml  22.00 ml/m LA Vol (A4C):   74.1 ml 41.69 ml/m LA Biplane Vol: 73.1 ml 41.13 ml/m  AORTIC VALVE AV Area (Vmax):    1.40 cm AV Area (Vmean):   1.21 cm AV Area (VTI):     1.21 cm AV Vmax:           275.60 cm/s AV Vmean:          208.200 cm/s AV VTI:            0.700 m AV Peak Grad:      30.4 mmHg AV Mean Grad:      19.0 mmHg LVOT Vmax:         67.30 cm/s LVOT Vmean:        43.900 cm/s LVOT VTI:          0.148 m LVOT/AV VTI ratio: 0.21  AORTA Ao Root diam: 3.00 cm Ao Asc diam:  3.80 cm MITRAL VALVE                TRICUSPID VALVE MV Area (PHT): 3.28 cm     TR Peak grad:   39.2 mmHg MV Decel Time: 231 msec     TR Vmax:        313.00 cm/s MV E velocity:  139.67 cm/s MV A velocity: 79.10 cm/s   SHUNTS MV E/A ratio:  1.77         Systemic VTI:  0.15 m                             Systemic Diam: 2.70 cm Candee Furbish MD Electronically signed by Candee Furbish MD Signature Date/Time: 08/03/2021/11:17:51 AM    Final

## 2021-08-05 LAB — COMPREHENSIVE METABOLIC PANEL
ALT: 19 U/L (ref 0–44)
AST: 27 U/L (ref 15–41)
Albumin: 2.3 g/dL — ABNORMAL LOW (ref 3.5–5.0)
Alkaline Phosphatase: 404 U/L — ABNORMAL HIGH (ref 38–126)
Anion gap: 11 (ref 5–15)
BUN: 36 mg/dL — ABNORMAL HIGH (ref 8–23)
CO2: 28 mmol/L (ref 22–32)
Calcium: 8.4 mg/dL — ABNORMAL LOW (ref 8.9–10.3)
Chloride: 95 mmol/L — ABNORMAL LOW (ref 98–111)
Creatinine, Ser: 3.86 mg/dL — ABNORMAL HIGH (ref 0.61–1.24)
GFR, Estimated: 14 mL/min — ABNORMAL LOW (ref >60)
Glucose, Bld: 97 mg/dL (ref 70–99)
Potassium: 4.1 mmol/L (ref 3.5–5.1)
Sodium: 134 mmol/L — ABNORMAL LOW (ref 135–145)
Total Bilirubin: 1.2 mg/dL (ref 0.3–1.2)
Total Protein: 6.8 g/dL (ref 6.5–8.1)

## 2021-08-05 MED ORDER — ISOSORBIDE MONONITRATE ER 30 MG PO TB24: 15.0000 mg | ORAL_TABLET | Freq: Every day | ORAL | 0 refills | Status: DC

## 2021-08-05 MED ORDER — CARVEDILOL 3.125 MG PO TABS: 3.1250 mg | ORAL_TABLET | Freq: Two times a day (BID) | ORAL | 0 refills | Status: DC

## 2021-08-05 MED ORDER — ASPIRIN 81 MG PO CHEW: 81.0000 mg | CHEWABLE_TABLET | Freq: Every day | ORAL | 0 refills | Status: DC

## 2021-08-05 MED ORDER — LEVOTHYROXINE SODIUM 100 MCG PO TABS: 100.0000 ug | ORAL_TABLET | Freq: Every day | ORAL | 0 refills | Status: DC

## 2021-08-05 NOTE — Progress Notes (Signed)
  [facility] Progress Note   Subjective:   Seen in room - hoping for discharge today. No CP or dyspnea, energy still low.  Objective Vitals:   08/04/21 2348 08/05/21 0319 08/05/21 0457 08/05/21 0745  BP: (!) 132/59 (!) 166/55  (!) 116/51  Pulse: 60 62  63  Resp: 18 20  19   Temp: 98.2 F (36.8 C) 99.6 F (37.6 C)  99 F (37.2 C)  TempSrc: Axillary Axillary  Oral  SpO2: 97% 98%  97%  Weight:   65 kg   Height:       Physical Exam General: Frail man, NAD. Nasal O2 Heart: RRR; 2/6 murmur Lungs: CTA anteriorly Abdomen: soft Extremities: No LE edema Dialysis Access:  AVF + thrill  Additional Objective Labs: Basic Metabolic Panel: Recent Labs  Lab 08/03/21 0343 08/04/21 0153 08/05/21 0050  NA 133* 133* 134*  K 4.5 4.9 4.1  CL 93* 93* 95*  CO2 26 25 28   GLUCOSE 95 96 97  BUN 65* 82* 36*  CREATININE 5.73* 6.48* 3.86*  CALCIUM 8.7* 8.4* 8.4*   Liver Function Tests: Recent Labs  Lab 08/04/21 0153 08/05/21 0050  AST 31 27  ALT 23 19  ALKPHOS 397* 404*  BILITOT 1.3* 1.2  PROT 6.9 6.8  ALBUMIN 2.3* 2.3*   CBC: Recent Labs  Lab 08/02/21 1134 08/03/21 0343 08/04/21 0153  WBC 6.4 6.9 6.8  NEUTROABS 4.5  --   --   HGB 11.2* 10.3* 10.8*  HCT 37.4* 33.1* 35.2*  MCV 93.3 92.7 92.9  PLT 259 239 257   Medications:  iron sucrose Stopped (08/04/21 1226)    (feeding supplement) PROSource Plus  30 mL Oral BID BM   aspirin  81 mg Oral Daily   calcium acetate  667 mg Oral TID WC   carvedilol  3.125 mg Oral BID WC   Chlorhexidine Gluconate Cloth  6 each Topical Q0600   escitalopram  10 mg Oral QHS   heparin  5,000 Units Subcutaneous Q8H   hydrALAZINE  10 mg Oral Q8H   isosorbide mononitrate  15 mg Oral Daily   levothyroxine  100 mcg Oral Q0600   mouth rinse  15 mL Mouth Rinse BID   mirabegron ER  50 mg Oral QHS   pantoprazole  20 mg Oral Daily   polyethylene glycol  17 g Oral Daily   sodium chloride flush  3 mL Intravenous Q12H    Dialysis  Orders: [facility] MWF 3.5 hrs 180NRe 400/Autoflow 1.5 66 kg 2.0K/2.0 Ca AVF -No heparin  -Venofer 100 mg IV X 5 doses-3/5 doses given -Venofer 50 mg IV weekly -Mircera 100 mcg IV q 2 weeks (last dose 07/26/2021)    Assessment/Plan: New Combined systolic and diastolic HF with EF 15%. G2DD. Bilateral pleural effusions on CXR. Cardiology consulted, conservative measures planned. Manage volume with HD.  Chronic Respiratory Failure/COPD: Management per primary.   ESRD: Usual MWF schedule. Next HD 9/5.  Hypertension/volume  - BP controlled. Amlodipine changed to carvedilol per primary. Left under EDW last HD. Lower volume as tolerated.   Anemia  - Hgb 10.8. Recent ESA and Fe Load. Follow.   Metabolic bone disease - Ca ok, continue binders.  Nutrition - Alb low, continue protein supplements.    Veneta Penton, PA-C 08/05/2021, 10:35 AM  [employer]

## 2021-08-05 NOTE — Discharge Instructions (Signed)
Follow with Primary MD Avva, Ravisankar, MD in 7 days   Get CBC, CMP, TSH, 2 view Chest X ray -  checked next visit within 1 week by Primary MD   Activity: As tolerated with Full fall precautions use walker/cane & assistance as needed  Disposition Home   Diet: Renal, 1.5 lit/ day fluid restriction  Special Instructions: If you have smoked or chewed Tobacco  in the last 2 yrs please stop smoking, stop any regular Alcohol  and or any Recreational drug use.  On your next visit with your primary care physician please Get Medicines reviewed and adjusted.  Please request your Prim.MD to go over all Hospital Tests and Procedure/Radiological results at the follow up, please get all Hospital records sent to your Prim MD by signing hospital release before you go home.  If you experience worsening of your admission symptoms, develop shortness of breath, life threatening emergency, suicidal or homicidal thoughts you must seek medical attention immediately by calling 911 or calling your MD immediately  if symptoms less severe.  You Must read complete instructions/literature along with all the possible adverse reactions/side effects for all the Medicines you take and that have been prescribed to you. Take any new Medicines after you have completely understood and accpet all the possible adverse reactions/side effects.

## 2021-08-05 NOTE — Discharge Summary (Signed)
Lonnie [name]:[id number] DOB: 1925-08-28 DOA: 08/02/2021  PCP: Prince Solian, MD  Admit date: 08/02/2021  Discharge date: 08/05/2021  Admitted From: Home Disposition:  Home   Recommendations for Outpatient Follow-up:   Follow up with PCP in 1-2 weeks  PCP Please obtain BMP/CBC, 2 view CXR in 1week,  (see Discharge instructions)   PCP Please follow up on the following pending results:    Home Health: PT,RN, SW   Equipment/Devices: Walker  Consultations: Cards, Renal Discharge Condition: Stable    CODE STATUS: Full    Diet Recommendation: Heart Healthy with 1.5 lit fluid restriction per day    Chief Complaint  Patient presents with   Fall     Brief history of present illness from the day of admission and additional interim summary    Lonnie [name] is a 87 y.o. male with medical history significant for ESRD on MWF HD, COPD, chronic respiratory failure with hypoxia on 4 L supplemental O2 via [place], HTN, hypothyroidism who presented to the ED for evaluation of fatigue and recent fall at home.                                                                   Hospital Course   Generalized weakness/fall at home: he has been feeling weak and fatigued for several days, had a fall at home.  Did not lose consciousness.  Head CT was negative.  No focal deficits.  Likely from below mentioned heart issues.    New found chronic combined diastolic and systolic heart failure with EF 20% with severe AS - Mild troponin elevation 279, downtrending on repeat.  Denies any recent chest pain.  EKG shows bifascicular block, similar to prior. EF 20%, +ve WMA with Moderate to severe AS -seen by cardiology not a candidate for invasive procedures, medical management continued, recommendations are for patient to continue medical  treatment, if declines further pursue hospice, discussed with patient and patient's niece plan is to discharge home with home social work and PT Therapist, sports, once he declines social work to initiate hospice.     ESRD on MWF HD: Reports last HD 8/29.  Nephrology following on MWF schedule.   COPD/chronic respiratory failure with hypoxia: Chronic and stable.  Continue supplemental oxygen and albuterol as needed.   Hypertension: On Coreg along with Imdur combination monitor.   Hypothyroidism: TSH was elevated and Synthroid dose has been increased.   Discharge diagnosis     Principal Problem:   Generalized weakness Active Problems:   ESRD on dialysis [facility])   Hypothyroidism   Hypertension   Elevated troponin    Discharge instructions    Discharge Instructions     Diet - low sodium heart healthy   Complete by: As directed    Discharge instructions  Complete by: As directed    Follow with Primary MD Avva, Ravisankar, MD in 7 days   Get CBC, CMP, TSH, 2 view Chest X ray -  checked next visit within 1 week by Primary MD   Activity: As tolerated with Full fall precautions use walker/cane & assistance as needed  Disposition Home   Diet: Renal, 1.5 lit/ day fluid restriction  Special Instructions: If you have smoked or chewed Tobacco  in the last 2 yrs please stop smoking, stop any regular Alcohol  and or any Recreational drug use.  On your next visit with your primary care physician please Get Medicines reviewed and adjusted.  Please request your Prim.MD to go over all Hospital Tests and Procedure/Radiological results at the follow up, please get all Hospital records sent to your Prim MD by signing hospital release before you go home.  If you experience worsening of your admission symptoms, develop shortness of breath, life threatening emergency, suicidal or homicidal thoughts you must seek medical attention immediately by calling 911 or calling your MD immediately  if symptoms less  severe.  You Must read complete instructions/literature along with all the possible adverse reactions/side effects for all the Medicines you take and that have been prescribed to you. Take any new Medicines after you have completely understood and accpet all the possible adverse reactions/side effects.   Increase activity slowly   Complete by: As directed        Discharge Medications   Allergies as of 08/05/2021   No Known Allergies      Medication List     STOP taking these medications    amLODipine 5 MG tablet Commonly known as: NORVASC       TAKE these medications    acetaminophen 325 MG tablet Commonly known as: Tylenol Take 2 tablets (650 mg total) by mouth every 6 (six) hours as needed for mild pain.   aspirin 81 MG chewable tablet Chew 1 tablet (81 mg total) by mouth daily. Start taking on: August 06, 2021   calcium acetate 667 MG capsule Commonly known as: PHOSLO Take 1 capsule by mouth 3 (three) times daily with meals.   carvedilol 3.125 MG tablet Commonly known as: COREG Take 1 tablet (3.125 mg total) by mouth 2 (two) times daily with a meal.   diclofenac Sodium 1 % Gel Commonly known as: Voltaren Apply 4 g topically 4 (four) times daily.   diphenhydramine-acetaminophen 25-500 MG Tabs tablet Commonly known as: TYLENOL PM Take 1 tablet by mouth at bedtime as needed (sleep).   escitalopram 10 MG tablet Commonly known as: LEXAPRO Take 10 mg by mouth at bedtime.   isosorbide mononitrate 30 MG 24 hr tablet Commonly known as: IMDUR Take 0.5 tablets (15 mg total) by mouth daily. Start taking on: August 06, 2021   levothyroxine 100 MCG tablet Commonly known as: SYNTHROID Take 1 tablet (100 mcg total) by mouth daily at 6 (six) AM. Start taking on: August 06, 2021 What changed:  medication strength how much to take when to take this   lidocaine-prilocaine cream Commonly known as: EMLA Apply 1 application topically daily as needed (port  access).   mirabegron ER 50 MG Tb24 tablet Commonly known as: MYRBETRIQ Take 50 mg by mouth at bedtime.   multivitamin Tabs tablet Take 1 tablet by mouth daily.   pantoprazole 20 MG tablet Commonly known as: PROTONIX Take 1 tablet (20 mg total) by mouth daily.   polyethylene glycol 17 g packet Commonly known as:  MiraLax Take 17 g by mouth daily.   polyvinyl alcohol 1.4 % ophthalmic solution Commonly known as: LIQUIFILM TEARS Place 1-2 drops into both eyes as needed for dry eyes.   VITAMIN B-12 PO Take 1 tablet by mouth daily.         Follow-up Information     Roby Lofts M., PA-C Follow up.   Specialties: Physician Assistant, Cardiology Why: [facility] (Cardiology) - [place] location - a follow-up has been scheduled for you on Wednesday Sep 20, 2021 9:45 AM (Arrive by 9:30 AM). Daleen Snook is one of our PAs with our cardiogy team. Contact information: [address] [place] [address] [phone]         Care, [facility] Follow up.   Specialty: Home Health Services Why: Someone from [facility] will contact you to arrange start date and time for your therapy. Contact information: [place] [address] [phone]         Prince Solian, MD. Schedule an appointment as soon as possible for a visit in 1 week(s).   Specialty: Internal Medicine Contact information: [address] [place] [address] [phone]                 Major procedures and Radiology Reports - PLEASE review detailed and final reports thoroughly  -       DG Chest 2 View  Result Date: 08/02/2021 CLINICAL DATA:  Fall.  Backwards fall EXAM: CHEST - 2 VIEW COMPARISON:  3242 FINDINGS: Twenty normal cardiac silhouette. There is a moderate RIGHT effusion. Small LEFT effusion. No pneumothorax. No fracture. IMPRESSION: 1. Bilateral effusions.  Moderate RIGHT effusion. 2. No pneumothorax or fracture identified.  Electronically Signed   By: Suzy Bouchard M.D.   On: 08/02/2021 12:41   CT HEAD WO CONTRAST (5MM)  Result Date: 08/02/2021 CLINICAL DATA:  Head trauma, minor (Age >= 65y) posterior head injury, fall, 3 days ago EXAM: CT HEAD WITHOUT CONTRAST TECHNIQUE: Contiguous axial images were obtained from the base of the skull through the vertex without intravenous contrast. COMPARISON:  11/12/2012 FINDINGS: Brain: No evidence of acute infarction, hemorrhage, hydrocephalus, extra-axial collection or mass lesion/mass effect. Scattered low-density changes within the periventricular and subcortical white matter compatible with chronic microvascular ischemic change. Mild diffuse cerebral volume loss. Vascular: Atherosclerotic calcifications involving the large vessels of the skull base. No unexpected hyperdense vessel. Skull: Normal. Negative for fracture or focal lesion. Sinuses/Orbits: No acute finding. Other: Soft tissue swelling with ill-defined hematoma in the parieto-occipital scalp. IMPRESSION: 1. No acute intracranial findings. 2. Soft tissue swelling with ill-defined hematoma in the parieto-occipital scalp. No underlying calvarial fracture. 3. Chronic microvascular ischemic change and cerebral volume loss. Electronically Signed   By: Davina Poke D.O.   On: 08/02/2021 12:29   ECHOCARDIOGRAM COMPLETE  Result Date: 08/03/2021    ECHOCARDIOGRAM REPORT   Patient Name:   KENTREL CLEVENGER Date of Exam: 08/03/2021 Medical Rec #:  [id number]       Height:       68.0 in Accession #:    [id number]      Weight:       144.0 lb Date of Birth:  [birth year]       BSA:          1.777 m Patient Age:    14 years        BP:           143/64 mmHg Patient Gender: M  HR:           58 bpm. Exam Location:  Inpatient Procedure: 2D Echo, 3D Echo, Cardiac Doppler and Color Doppler Indications:    Elevated Troponin  History:        Patient has no prior history of Echocardiogram examinations.                 COPD; Risk  Factors:Former Smoker and Hypertension. Generalized                 weakness/mechanical fall at home. Chronic kidney disease,                 Chronic respiratory failure with hypoxia, thyroid disease.  Sonographer:    Darlina Sicilian RDCS Referring Phys: [id number] [place]  1. Left ventricular ejection fraction by 3D volume is 20 %. The left ventricle has severely decreased function. The left ventricle demonstrates global hypokinesis. Left ventricular diastolic parameters are consistent with Grade II diastolic dysfunction (pseudonormalization).  2. Right ventricular systolic function is severely reduced. The right ventricular size is mildly enlarged. There is moderately elevated pulmonary artery systolic pressure. The estimated right ventricular systolic pressure is 67.1 mmHg.  3. Left atrial size was mildly dilated.  4. The mitral valve is normal in structure. Moderate mitral valve regurgitation. No evidence of mitral stenosis.  5. Tricuspid valve regurgitation is moderate.  6. The aortic valve is tricuspid. Aortic valve regurgitation is not visualized. Moderate aortic valve stenosis. Aortic valve area, by VTI measures 1.21 cm. Aortic valve mean gradient measures 19.0 mmHg. Aortic valve Vmax measures 2.76 m/s.  7. The inferior vena cava is normal in size with greater than 50% respiratory variability, suggesting right atrial pressure of 3 mmHg. FINDINGS  Left Ventricle: Left ventricular ejection fraction by 3D volume is 20 %. The left ventricle has severely decreased function. The left ventricle demonstrates global hypokinesis. The left ventricular internal cavity size was normal in size. There is no left ventricular hypertrophy. Left ventricular diastolic parameters are consistent with Grade II diastolic dysfunction (pseudonormalization). Right Ventricle: The right ventricular size is mildly enlarged. No increase in right ventricular wall thickness. Right ventricular systolic function is severely  reduced. There is moderately elevated pulmonary artery systolic pressure. The tricuspid regurgitant velocity is 3.13 m/s, and with an assumed right atrial pressure of 8 mmHg, the estimated right ventricular systolic pressure is 24.5 mmHg. Left Atrium: Left atrial size was mildly dilated. Right Atrium: Right atrial size was normal in size. Pericardium: There is no evidence of pericardial effusion. Mitral Valve: The mitral valve is normal in structure. Moderate mitral valve regurgitation. No evidence of mitral valve stenosis. Tricuspid Valve: The tricuspid valve is normal in structure. Tricuspid valve regurgitation is moderate . No evidence of tricuspid stenosis. Aortic Valve: The aortic valve is tricuspid. Aortic valve regurgitation is not visualized. Moderate aortic stenosis is present. Aortic valve mean gradient measures 19.0 mmHg. Aortic valve peak gradient measures 30.4 mmHg. Aortic valve area, by VTI measures 1.21 cm. Pulmonic Valve: The pulmonic valve was normal in structure. Pulmonic valve regurgitation is trivial. No evidence of pulmonic stenosis. Aorta: The aortic root is normal in size and structure. Venous: The inferior vena cava is normal in size with greater than 50% respiratory variability, suggesting right atrial pressure of 3 mmHg. IAS/Shunts: No atrial level shunt detected by color flow Doppler.  LEFT VENTRICLE PLAX 2D LVIDd:         5.40 cm  Diastology LVIDs:         4.40 cm         LV e' medial:    4.05 cm/s LV PW:         0.90 cm         LV E/e' medial:  34.5 LV IVS:        0.90 cm         LV e' lateral:   10.30 cm/s LVOT diam:     2.70 cm         LV E/e' lateral: 13.6 LV SV:         85 LV SV Index:   48 LVOT Area:     5.73 cm        3D Volume EF                                LV 3D EF:    Left                                             ventricul                                             ar                                             ejection                                              fraction                                             by 3D                                             volume is                                             20 %.                                 3D Volume EF:                                3D EF:        20 %                                LV  EDV:       153 ml                                LV ESV:       123 ml                                LV SV:        30 ml RIGHT VENTRICLE RV S prime:     5.96 cm/s TAPSE (M-mode): 1.1 cm LEFT ATRIUM             Index       RIGHT ATRIUM           Index LA diam:        4.20 cm 2.36 cm/m  RA Area:     17.00 cm LA Vol (A2C):   70.3 ml 39.56 ml/m RA Volume:   39.10 ml  22.00 ml/m LA Vol (A4C):   74.1 ml 41.69 ml/m LA Biplane Vol: 73.1 ml 41.13 ml/m  AORTIC VALVE AV Area (Vmax):    1.40 cm AV Area (Vmean):   1.21 cm AV Area (VTI):     1.21 cm AV Vmax:           275.60 cm/s AV Vmean:          208.200 cm/s AV VTI:            0.700 m AV Peak Grad:      30.4 mmHg AV Mean Grad:      19.0 mmHg LVOT Vmax:         67.30 cm/s LVOT Vmean:        43.900 cm/s LVOT VTI:          0.148 m LVOT/AV VTI ratio: 0.21  AORTA Ao Root diam: 3.00 cm Ao Asc diam:  3.80 cm MITRAL VALVE                TRICUSPID VALVE MV Area (PHT): 3.28 cm     TR Peak grad:   39.2 mmHg MV Decel Time: 231 msec     TR Vmax:        313.00 cm/s MV E velocity: 139.67 cm/s MV A velocity: 79.10 cm/s   SHUNTS MV E/A ratio:  1.77         Systemic VTI:  0.15 m                             Systemic Diam: 2.70 cm Candee Furbish MD Electronically signed by Candee Furbish MD Signature Date/Time: 08/03/2021/11:17:51 AM    Final      Today   Subjective    Hiram Gash today has no headache,no chest abdominal pain,no new weakness tingling or numbness, feels much better wants to go home today.   Objective   Blood pressure (!) 116/51, pulse 63, temperature 99 F (37.2 C), temperature source Oral, resp. rate 19, height 5\' 8"  (1.727 m), weight 65 kg, SpO2 97 %.   Intake/Output  Summary (Last 24 hours) at 08/05/2021 0950 Last data filed at 08/04/2021 1500 Gross per 24 hour  Intake 400 ml  Output 850 ml  Net -450 ml    Exam  Awake Alert, No new F.N deficits, Normal affect [place].AT,PERRAL Supple Neck,No JVD, No cervical lymphadenopathy appriciated.  Symmetrical Chest wall movement,  Good air movement bilaterally, CTAB RRR,No Gallops, positive aortic systolic murmur +ve B.Sounds, Abd Soft, Non tender, No organomegaly appriciated, No rebound -guarding or rigidity. No Cyanosis, Clubbing or edema, No new Rash or bruise   Data Review   CBC w Diff:  Lab Results  Component Value Date   WBC 6.8 08/04/2021   HGB 10.8 (L) 08/04/2021   HGB 11.8 (L) 04/09/2017   HCT 35.2 (L) 08/04/2021   HCT 36.1 (L) 04/09/2017   PLT 257 08/04/2021   PLT 256 04/09/2017   LYMPHOPCT 10 08/02/2021   LYMPHOPCT 13.0 (L) 04/09/2017   MONOPCT 13 08/02/2021   MONOPCT 8.8 04/09/2017   EOSPCT 6 08/02/2021   EOSPCT 8.6 (H) 04/09/2017   BASOPCT 1 08/02/2021   BASOPCT 0.9 04/09/2017    CMP:  Lab Results  Component Value Date   NA 134 (L) 08/05/2021   NA 138 04/09/2017   K 4.1 08/05/2021   K 4.5 04/09/2017   CL 95 (L) 08/05/2021   CO2 28 08/05/2021   CO2 27 04/09/2017   BUN 36 (H) 08/05/2021   BUN 26.9 (H) 04/09/2017   CREATININE 3.86 (H) 08/05/2021   CREATININE 4.0 (HH) 04/09/2017   PROT 6.8 08/05/2021   PROT 7.7 04/09/2017   ALBUMIN 2.3 (L) 08/05/2021   ALBUMIN 3.7 04/09/2017   BILITOT 1.2 08/05/2021   BILITOT 0.47 04/09/2017   ALKPHOS 404 (H) 08/05/2021   ALKPHOS 123 04/09/2017   AST 27 08/05/2021   AST 18 04/09/2017   ALT 19 08/05/2021   ALT 10 04/09/2017  .   Total Time in preparing paper work, data evaluation and todays exam - 58 minutes  Lala Lund M.D on 08/05/2021 at 9:50 AM  [facility]

## 2021-08-05 NOTE — TOC Transition Note (Signed)
Transition of Care [facility]) - CM/SW Discharge Note   Patient Details  Name: Lonnie [name] MRN: [id number] Date of Birth: 1925-04-15  [facility]) CM/SW Contact:  Carles Collet, RN Phone Number: 08/05/2021, 9:15 AM   Clinical Narrative:    Spoke w Niece and she states that they are ready at home for the patient. Agreeable to [facility] being called. Demographics verified.  Notified nurse via secure chat that Corey Harold has been called. Forms given to unit secretary.     Final next level of care: [place] Barriers to Discharge: Continued Medical Work up   Patient Goals and CMS Choice Patient states their goals for this hospitalization and ongoing recovery are:: Return home CMS Medicare.gov Compare Post Acute Care list provided to:: Patient Represenative (must comment) Choice offered to / list presented to : [facility] POA / Guardian (Neicee Baird Kay)  Discharge Placement                       Discharge Plan and Services In-house Referral: Clinical Social Work Discharge Planning Services: CM Consult Post Acute Care Choice: Home Health          DME Arranged: N/A         HH Arranged: PT, OT [place] Agency: [place] Date [place]: 08/04/21 Time [place]: [phone] Representative spoke with at [place]: [place] ([place]) Interventions     Readmission Risk Interventions No flowsheet data found.

## 2021-08-05 NOTE — Progress Notes (Signed)
Pt and son able to arrange a ride from a family member, pts niece aware he was coming home now instead of with PTAR. Pt discussed DC instructions, son also present in room. Pt given all belongings including clothing, glasses, dentures and hearing aides. Pt transported to main entrance with tech, son and this Probation officer in wheelchair.

## 2021-08-07 NOTE — TOC Transition Note (Signed)
Transition of care contact from inpatient facility  Date of discharge: 08/05/21 Date of contact: 08/07/21 Method: Phone Spoke to: Patient  Patient contacted to discuss transition of care from recent inpatient hospitalization. Patient was admitted to [facility] from 08/02/21-08/05/2021 with discharge diagnosis of new found chronic combined diastolic and systolic heart failure, fatigue, and recent fall.  Medication changes were reviewed. Amlodipine was discontinued, Baby ASA and Imdur were initiated, and Synthroid dose was increased. Patient reports he will schedule his f/u appt with his PCP tomorrow 9/6. F/u appt with Cardiology already scheduled on 09/20/21.  Patient received scheduled HD today 08/07/21 at [facility]. Plan for renal team to f/u with patient this week.  Tobie Poet, NP

## 2021-08-08 ENCOUNTER — Other Ambulatory Visit: Payer: Self-pay

## 2021-08-08 ENCOUNTER — Ambulatory Visit (HOSPITAL_COMMUNITY)
Admission: RE | Admit: 2021-08-08 | Discharge: 2021-08-08 | Disposition: A | Discharge status: Home / Self Care | Payer: Medicare Other | Source: Ambulatory Visit | Attending: Vascular Surgery | Admitting: Vascular Surgery

## 2021-08-08 ENCOUNTER — Ambulatory Visit (INDEPENDENT_AMBULATORY_CARE_PROVIDER_SITE_OTHER): Payer: Medicare Other | Admitting: Physician Assistant

## 2021-08-08 VITALS — BP 112/48 | HR 58 | Temp 98.2°F | Resp 16 | Ht 67.0 in | Wt 144.0 lb

## 2021-08-08 DIAGNOSIS — M79671 Pain in right foot: Secondary | ICD-10-CM | POA: Insufficient documentation

## 2021-08-09 ENCOUNTER — Encounter: Payer: Self-pay | Admitting: Physician Assistant

## 2021-08-09 NOTE — Progress Notes (Signed)
Office Note     CC:  follow up Requesting Provider:  Prince Solian, MD  HPI: Lonnie [name] is a 87 y.o. ([birth year]) male well-known to the office with prior dialysis access procedures.  He is now complaining of a right lateral malleolus pressure points.  He states it is not yet ulcerated however it is starting to lose a skin layer.  He says it is extremely sensitive to touch.  He has since switched to sleeping on his left side as opposed to his right side to avoid pressure to this area.  He is ambulatory with a walker.  He denies rest pain in his foot or any other nonhealing wounds.  He is on a daily aspirin.  In 2020 arterial duplex demonstrated a widely patent SFA and an occluded PTA of the right lower extremity.  He denies tobacco use.  He requires oxygen 24/7.    Past Medical History:  Diagnosis Date   Adenomatous polyps    Bursitis    left hip   Chronic kidney disease    Stage 4   Frequent urination at night    GERD (gastroesophageal reflux disease)    Hypertension    Hypothyroidism    Pneumonia    Polyclonal gammopathy determined by serum protein electrophoresis 10/04/2015    Past Surgical History:  Procedure Laterality Date   AORTIC ARCH ANGIOGRAPHY N/A 08/01/2017   Procedure: Aortic Arch Angiography;  Surgeon: Conrad [place], MD;  Location: [facility];  Service: Cardiovascular;  Laterality: N/A;   AV FISTULA PLACEMENT Left 07/10/2016   Procedure: LEFT BRACHIOCEPHALIC ARTERIOVENOUS (AV) FISTULA CREATION;  Surgeon: Conrad [place], MD;  Location: [facility];  Service: Vascular;  Laterality: Left;   COLONOSCOPY     RENAL BIOPSY Left 10/06/2015   REVISON OF ARTERIOVENOUS FISTULA Left 56/43/3295   Procedure: PLICATION OF LEFT BRACHIOCEPHALIC ARTERIOVENOUS FISTULA;  Surgeon: Conrad Clover[place], MD;  Location: [facility];  Service: Vascular;  Laterality: Left;   SKIN SURGERY     back   UPPER EXTREMITY ANGIOGRAPHY Left 08/01/2017   Procedure: Upper Extremity Angiography;  Surgeon: Conrad Murray, MD;  Location: [facility];  Service: Cardiovascular;  Laterality: Left;    Social History   Socioeconomic History   Marital status: Widowed    Spouse name: Not on file   Number of children: Not on file   Years of education: Not on file   Highest education level: Not on file  Occupational History   Not on file  Tobacco Use   Smoking status: Former    Types: Cigarettes    Quit date: 01/25/1980    Years since quitting: 41.5   Smokeless tobacco: Never  Vaping Use   Vaping Use: Never used  Substance and Sexual Activity   Alcohol use: No    Comment: former alcoholic 32 years ago was last drink   Drug use: No   Sexual activity: Not on file  Other Topics Concern   Not on file  Social History Narrative   Not on file   Social Determinants of Health   Financial Resource Strain: Not on file  Food Insecurity: Not on file  Transportation Needs: Not on file  Physical Activity: Not on file  Stress: Not on file  Social Connections: Not on file  Intimate Partner Violence: Not on file    Family History  Problem Relation Age of Onset   Diabetes Father    Diabetes Brother    Heart disease  Brother    Colon cancer Neg Hx    Rectal cancer Neg Hx     Current Outpatient Medications  Medication Sig Dispense Refill   acetaminophen (TYLENOL) 325 MG tablet Take 2 tablets (650 mg total) by mouth every 6 (six) hours as needed for mild pain. 30 tablet 1   aspirin 81 MG chewable tablet Chew 1 tablet (81 mg total) by mouth daily. 30 tablet 0   calcium acetate (PHOSLO) 667 MG capsule Take 1 capsule by mouth 3 (three) times daily with meals.   0   carvedilol (COREG) 3.125 MG tablet Take 1 tablet (3.125 mg total) by mouth 2 (two) times daily with a meal. 60 tablet 0   Cyanocobalamin (VITAMIN B-12 PO) Take 1 tablet by mouth daily.     diclofenac Sodium (VOLTAREN) 1 % GEL Apply 4 g topically 4 (four) times daily. 150 g 0   diphenhydramine-acetaminophen (TYLENOL PM) 25-500 MG TABS  tablet Take 1 tablet by mouth at bedtime as needed (sleep).     escitalopram (LEXAPRO) 10 MG tablet Take 10 mg by mouth at bedtime.      isosorbide mononitrate (IMDUR) 30 MG 24 hr tablet Take 0.5 tablets (15 mg total) by mouth daily. 30 tablet 0   levothyroxine (SYNTHROID) 100 MCG tablet Take 1 tablet (100 mcg total) by mouth daily at 6 (six) AM. 30 tablet 0   lidocaine-prilocaine (EMLA) cream Apply 1 application topically daily as needed (port access).   0   mirabegron ER (MYRBETRIQ) 50 MG TB24 tablet Take 50 mg by mouth at bedtime.      multivitamin (RENA-VIT) TABS tablet Take 1 tablet by mouth daily.     pantoprazole (PROTONIX) 20 MG tablet Take 1 tablet (20 mg total) by mouth daily. 30 tablet 0   polyethylene glycol (MIRALAX) packet Take 17 g by mouth daily. 14 each 0   polyvinyl alcohol (LIQUIFILM TEARS) 1.4 % ophthalmic solution Place 1-2 drops into both eyes as needed for dry eyes.     No current facility-administered medications for this visit.    No Known Allergies   REVIEW OF SYSTEMS:   [X]  denotes positive finding, [ ]  denotes negative finding Cardiac  Comments:  Chest pain or chest pressure:    Shortness of breath upon exertion:    Short of breath when lying flat:    Irregular heart rhythm:        Vascular    Pain in calf, thigh, or hip brought on by ambulation:    Pain in feet at night that wakes you up from your sleep:     Blood clot in your veins:    Leg swelling:         Pulmonary    Oxygen at home:    Productive cough:     Wheezing:         Neurologic    Sudden weakness in arms or legs:     Sudden numbness in arms or legs:     Sudden onset of difficulty speaking or slurred speech:    Temporary loss of vision in one eye:     Problems with dizziness:         Gastrointestinal    Blood in stool:     Vomited blood:         Genitourinary    Burning when urinating:     Blood in urine:        Psychiatric    Major depression:  Hematologic     Bleeding problems:    Problems with blood clotting too easily:        Skin    Rashes or ulcers:        Constitutional    Fever or chills:      PHYSICAL EXAMINATION:  Vitals:   08/08/21 1414  BP: (!) 112/48  Pulse: (!) 58  Resp: 16  Temp: 98.2 F (36.8 C)  TempSrc: Temporal  SpO2: 98%  Weight: 144 lb (65.3 kg)  Height: 5\' 7"  (1.702 m)  PF: (!) 2 L/min    General:  WDWN in NAD; vital signs documented above Gait: Not observed HENT: WNL, normocephalic Pulmonary: normal non-labored breathing , without Rales, rhonchi,  wheezing Cardiac: regular HR Abdomen: soft, NT, no masses Skin: without rashes Vascular Exam/Pulses:  Right Left  Radial 2+ (normal) 2+ (normal)  DP absent absent  PT absent absent   Extremities: R lateral malleolus with some skin breakdown but no open wounds; feet warm and well perfused; brisk R ATA signal Musculoskeletal: no muscle wasting or atrophy  Neurologic: A&O X 3;  No focal weakness or paresthesias are detected Psychiatric:  The pt has Normal affect.   Non-Invasive Vascular Imaging:   ABI Findings:  +--------+------------------+-----+----------+--------+  Right   Rt Pressure (mmHg)IndexWaveform  Comment   +--------+------------------+-----+----------+--------+  [id number]                                        +--------+------------------+-----+----------+--------+  PTA     176               1.28 monophasic          +--------+------------------+-----+----------+--------+  DP      125               0.91 biphasic            +--------+------------------+-----+----------+--------+   +---------+------------------+-----+---------+-------+  Left     Lt Pressure (mmHg)IndexWaveform Comment  +---------+------------------+-----+---------+-------+  PTA      254               1.85 biphasic          +---------+------------------+-----+---------+-------+  DP       231               1.69 triphasic          +---------+------------------+-----+---------+-------+  Great Toe85                0.62 Abnormal             ASSESSMENT/PLAN:: 87 y.o. male here for evaluation of R lateral malleolus pressure point   -Several week history of painful pressure point of right lateral malleolus limited to skin breakdown however no active ulceration -Known history of PAD with right lower extremity arterial duplex and 2020 demonstrating occluded PTA however patent SFA -Encouraged patient to avoid pressure to his right ankle throughout the day; agree with sleeping on his left side versus his right to avoid pressure -No indication for further work-up with angiography at this time.  We will allow time to see if conservative measures will be enough to avoid active ulceration.  We can always perform angiography if this area fails to improve.  Patient is agreeable to this plan.   Dagoberto Ligas, PA-C [facility] [phone]  Clinic MD:   Stanford Breed

## 2021-08-10 ENCOUNTER — Telehealth: Payer: Self-pay | Admitting: Cardiovascular Disease

## 2021-08-10 NOTE — Telephone Encounter (Signed)
Geralynn Rile, MD  You; Caprice Beaver, LPN 22 minutes ago (7:20 PM)   Call if less than 40 bpm, or symptoms.   [place] T. Audie Box, MD, [place]  [address] [place]  [place], [place] [address]  [phone]  4:34 PM   Returned call to [place] at [facility], made her aware of Dr. Kathalene Frames recommendations. Denise verbalized understanding.

## 2021-08-10 NOTE — Telephone Encounter (Signed)
Received call transferred directly from operator and spoke with Langley Gauss (home health nurse). She reports she is seeing patient today for the first time and his heart rate is 50. BP is 110/50. Took Coreg at 9:00 this morning.  Patient is feeling fine.  Chart reviewed and hospital note shows heart rate of 46-59 on 9/2 with overnight telemetry showing atrial fibrillation -heart rate 40-50. Langley Gauss reports heart rhythm sounds regular.  I advised her to have patient hold Coreg this evening if heart rate  less than 55 and that I would send to Dr Audie Box for parameters going forward.

## 2021-08-10 NOTE — Telephone Encounter (Signed)
Attempted to return call to [facility] with [facility]. Left message to call back to office when able.

## 2021-08-10 NOTE — Telephone Encounter (Signed)
STAT if HR is under 50 or over 120 (normal HR is 60-100 beats per minute)  What is your heart rate? 50   Do you have a log of your heart rate readings (document readings)?  50 and holding steady per [facility] Nurse   Do you have any other symptoms? No. Asymptomatic. Home Health Nurse reports BP of 110/50 siting   Denise from [facility] called to report low HR

## 2021-08-10 NOTE — Telephone Encounter (Signed)
Geralynn Rile, MD  Thompson Grayer, RN; Cv Div Nl Triage 1 hour ago (2:45 PM)   If no symptoms, ok to continue. Thanks!   [place] T. Audie Box, MD, [place]  [address], [place]  [place],  [address]  [phone]  2:45 PM    Returned call to [place] at [facility], advised her of Dr. Kathalene Frames recommendations. Per Langley Gauss they will need an actual parameter for low HR to hold the Carvedilol otherwise she states that they will have to call every time patient's HR is less than 60. Percival Spanish I would forward message to Dr. Audie Box for him to review and advise.

## 2021-08-24 ENCOUNTER — Telehealth: Payer: Self-pay | Admitting: Cardiovascular Disease

## 2021-08-24 NOTE — Telephone Encounter (Signed)
STAT if HR is under 50 or over 120 (normal HR is 60-100 beats per minute)  What is your heart rate? 60  Do you have a log of your heart rate readings (document readings)? 83,33,83,29,19(TYOM recent)  Do you have any other symptoms? [facility] states that his dialysis dr discontinued Carvedilol due to low heart rate, pt has been in the low 50's

## 2021-08-24 NOTE — Telephone Encounter (Signed)
Spoke to [place] Dr.O'Neal advised ok to stop coreg.

## 2021-08-24 NOTE — Telephone Encounter (Signed)
Spoke to [employer] with [facility] calling to let Dr.O'Neal know Dr.Lori Royce Macadamia at dialysis yesterday stopped coreg due to low heart rate.Stated if Dr.O'Neal wants to call her phone # [phone].Stated when she checks pt's pulse weekly it has been ranging 50 to 60's.He was told not to take coreg if pulse under 55.Today pulse 54 B/P 118/58.Patient has appointment with Roby Lofts PA 10/19 at 9:45 am.Advised to bring a list of B/P and pulse readings.I will send message to Dr.O'Neal.

## 2021-09-11 ENCOUNTER — Encounter (HOSPITAL_COMMUNITY): Payer: Self-pay | Admitting: *Deleted

## 2021-09-14 ENCOUNTER — Ambulatory Visit (INDEPENDENT_AMBULATORY_CARE_PROVIDER_SITE_OTHER): Payer: Medicare Other | Admitting: Podiatry

## 2021-09-14 ENCOUNTER — Other Ambulatory Visit: Payer: Self-pay

## 2021-09-14 DIAGNOSIS — M79675 Pain in left toe(s): Secondary | ICD-10-CM

## 2021-09-14 DIAGNOSIS — B351 Tinea unguium: Secondary | ICD-10-CM | POA: Diagnosis not present

## 2021-09-14 DIAGNOSIS — M79674 Pain in right toe(s): Secondary | ICD-10-CM

## 2021-09-19 NOTE — Progress Notes (Signed)
Cardiology Office Note   Date:  09/20/2021   ID:  Lonnie [name], DOB 1925/11/18, MRN [id number]  PCP:  Prince Solian, MD  Cardiologist:  Evalina Field, MD EP: None  Chief Complaint  Patient presents with   Hospitalization Follow-up    CHF      History of Present Illness: Lonnie [name] is a 87 y.o. male with a PMH of chronic combined/biventricular CHF, atrial fibrillation with SVR, HTN, COPD on home O2, ESRD on HD, and hypothyroidism, who presents for hospital follow-up.   He was seen by cardiology during an admission to the hospital 08/2021 for acute combined CHF after presenting with a mechanical fall and generalized weakness. Echocardiogram that admission showed EF 20%, global hypokinesis, G2DD, severely reduced RV systolic function, moderately elevated PA pressures, mild LAE, moderate MR, and moderate aortic stenosis. He was felt to be a poor candidate for aggressive cardiovascular care and was not interested in aggressive care. He was DNR/DNI. Management of his CHF was limited by ESRD and bradycardia. Give frequency of falls, ricks of anticoagulation outweighed risks. Palliative care consult was recommended as he was felt to be appropriate for hospice.   Home health RN called our office 08/10/21 to report bradycardia with HR down to 50, though without dizziness, lightheadedness, or syncope. He was advised to continue carvedilol as long as HR remained above 40 and patient was asymptomatic. Home health called again 08/24/21 to report ongoing bradycardia and recommendations to discontinue carvedilol from his nephrologist given ongoing bradycardia to the 50s.   He presents today with his nephew for hospital follow-up. Overall he has been doing fairly well. He is working with physical therapy to improve gait and stability. His main concern at this time if preventing falls. Thankfully no falls since discharge. He reports breathing is stable on home O2 2L - no increased O2 demand. He  denies chest pain, palpitations, orthopnea, PND, LE edema, dizziness, lightheadedness, or syncope. He has not missed any dialysis sessions. He understand limitations with management of his CHF/Afib. Family has no new concerns at today's visit.     Past Medical History:  Diagnosis Date   Adenomatous polyps    Bursitis    left hip   Chronic kidney disease    Stage 4   Frequent urination at night    GERD (gastroesophageal reflux disease)    Hypertension    Hypothyroidism    Pneumonia    Polyclonal gammopathy determined by serum protein electrophoresis 10/04/2015    Past Surgical History:  Procedure Laterality Date   AORTIC ARCH ANGIOGRAPHY N/A 08/01/2017   Procedure: Aortic Arch Angiography;  Surgeon: Conrad Sheffield, MD;  Location: [facility];  Service: Cardiovascular;  Laterality: N/A;   AV FISTULA PLACEMENT Left 07/10/2016   Procedure: LEFT BRACHIOCEPHALIC ARTERIOVENOUS (AV) FISTULA CREATION;  Surgeon: Conrad [place], MD;  Location: [facility];  Service: Vascular;  Laterality: Left;   COLONOSCOPY     RENAL BIOPSY Left 10/06/2015   REVISON OF ARTERIOVENOUS FISTULA Left 40/98/1191   Procedure: PLICATION OF LEFT BRACHIOCEPHALIC ARTERIOVENOUS FISTULA;  Surgeon: Conrad Brandt, MD;  Location: [facility];  Service: Vascular;  Laterality: Left;   SKIN SURGERY     back   UPPER EXTREMITY ANGIOGRAPHY Left 08/01/2017   Procedure: Upper Extremity Angiography;  Surgeon: Conrad , MD;  Location: [facility];  Service: Cardiovascular;  Laterality: Left;     Current Outpatient Medications  Medication Sig Dispense Refill   acetaminophen (  TYLENOL) 325 MG tablet Take 2 tablets (650 mg total) by mouth every 6 (six) hours as needed for mild pain. 30 tablet 1   aspirin 81 MG chewable tablet Chew 1 tablet (81 mg total) by mouth daily. 30 tablet 0   calcium acetate (PHOSLO) 667 MG capsule Take 1 capsule by mouth 3 (three) times daily with meals.   0   Cyanocobalamin (VITAMIN B-12 PO) Take 1 tablet by  mouth daily.     diclofenac Sodium (VOLTAREN) 1 % GEL Apply 4 g topically 4 (four) times daily. 150 g 0   diphenhydramine-acetaminophen (TYLENOL PM) 25-500 MG TABS tablet Take 1 tablet by mouth at bedtime as needed (sleep).     escitalopram (LEXAPRO) 10 MG tablet Take 10 mg by mouth at bedtime.      isosorbide mononitrate (IMDUR) 30 MG 24 hr tablet Take 0.5 tablets (15 mg total) by mouth daily. 30 tablet 0   levothyroxine (SYNTHROID) 100 MCG tablet Take 1 tablet (100 mcg total) by mouth daily at 6 (six) AM. 30 tablet 0   lidocaine-prilocaine (EMLA) cream Apply 1 application topically daily as needed (port access).   0   mirabegron ER (MYRBETRIQ) 50 MG TB24 tablet Take 50 mg by mouth at bedtime.      multivitamin (RENA-VIT) TABS tablet Take 1 tablet by mouth daily.     pantoprazole (PROTONIX) 20 MG tablet Take 1 tablet (20 mg total) by mouth daily. 30 tablet 0   polyethylene glycol (MIRALAX) packet Take 17 g by mouth daily. 14 each 0   polyvinyl alcohol (LIQUIFILM TEARS) 1.4 % ophthalmic solution Place 1-2 drops into both eyes as needed for dry eyes.     No current facility-administered medications for this visit.    Allergies:   Patient has no known allergies.    Social History:  The patient  reports that he quit smoking about 41 years ago. His smoking use included cigarettes. He has never used smokeless tobacco. He reports that he does not drink alcohol and does not use drugs.   Family History:  The patient's family history includes Diabetes in his brother and father; Heart disease in his brother.    ROS:  Please see the history of present illness.   Otherwise, review of systems are positive for none.   All other systems are reviewed and negative.    PHYSICAL EXAM: VS:  BP 114/60   Pulse 62   Wt 144 lb 12.8 oz (65.7 kg)   SpO2 96%   BMI 22.68 kg/m  , BMI Body mass index is 22.68 kg/m. GEN: Well nourished, well developed, in no acute distress HEENT: normal Neck: no JVD, carotid  bruits, or masses Cardiac: IRIR (bradycardic); + murmurs, no rubs or gallops, no edema  Respiratory:  clear to auscultation bilaterally, normal work of breathing GI: soft, nontender, nondistended, + BS MS: no deformity or atrophy Skin: warm and dry, no rash Neuro:  Strength and sensation are intact Psych: euthymic mood, full affect   EKG:  EKG is not ordered today.   Recent Labs: 08/02/2021: B Natriuretic Peptide >4,500.0 08/04/2021: Hemoglobin 10.8; Platelets 257; TSH 15.435 08/05/2021: ALT 19; BUN 36; Creatinine, Ser 3.86; Potassium 4.1; Sodium 134    Lipid Panel No results found for: CHOL, TRIG, HDL, CHOLHDL, VLDL, LDLCALC, LDLDIRECT    Wt Readings from Last 3 Encounters:  09/20/21 144 lb 12.8 oz (65.7 kg)  08/08/21 144 lb (65.3 kg)  08/05/21 143 lb 4.8 oz (65 kg)  Other studies Reviewed: Additional studies/ records that were reviewed today include:   TTE 08/03/2021  1. Left ventricular ejection fraction by 3D volume is 20 %. The left  ventricle has severely decreased function. The left ventricle demonstrates  global hypokinesis. Left ventricular diastolic parameters are consistent  with Grade II diastolic dysfunction  (pseudonormalization).   2. Right ventricular systolic function is severely reduced. The right  ventricular size is mildly enlarged. There is moderately elevated  pulmonary artery systolic pressure. The estimated right ventricular  systolic pressure is 10.2 mmHg.   3. Left atrial size was mildly dilated.   4. The mitral valve is normal in structure. Moderate mitral valve  regurgitation. No evidence of mitral stenosis.   5. Tricuspid valve regurgitation is moderate.   6. The aortic valve is tricuspid. Aortic valve regurgitation is not  visualized. Moderate aortic valve stenosis. Aortic valve area, by VTI  measures 1.21 cm. Aortic valve mean gradient measures 19.0 mmHg. Aortic  valve Vmax measures 2.76 m/s.   7. The inferior vena cava is normal in size  with greater than 50%  respiratory variability, suggesting right atrial pressure of 3 mmHg.     ASSESSMENT AND PLAN:  1. Chronic combined/biventricular CHF: EF 20% with G2DD and severely reduced RVSF on echo 08/2021. Patient is not a candidate for ischemic evaluation due to age/frailty/comorbidities. Recommended for medical management which has been limited by bradycardia/hypotension/ESRD. Volume status is stable today - Continue imdur - Continue volume management per nephrology with ongoing HD  2. Atrial fibrillation with SVR: also with RBBB/LAFB supporting further conduction disease. Not on anticoagulation per discussion with family during recent hospitalization given frequent falls. Now off Bblocker due to bradycardia in the 50s - Continue to monitor HR  3. Aortic stenosis: moderate-severe AS noted on recent echocardiogram/exam. Not a candidate for aggressive cardiovascular care.   - Continue symptomatic support - Avoid dehydration  4. HTN: BP 114/60 today.  - Continue imdur  5. Hypothyroidism: TSH 15.4 during recent admission. Levothyroixine increased - Encourage PCP follow-up for close monitoring / further med adjustments - Continue levothyroixine.    Current medicines are reviewed at length with the patient today.  The patient does not have concerns regarding medicines.  The following changes have been made:  As above  Labs/ tests ordered today include:  No orders of the defined types were placed in this encounter.    Disposition:   FU with Dr. Audie Box in 3 months  Signed, Abigail Butts, PA-C  09/20/2021 10:25 AM

## 2021-09-19 NOTE — Progress Notes (Signed)
  Subjective:  Patient ID: Lonnie [name], male    DOB: [birth year],  MRN: [id number]  Chief Complaint  Patient presents with   Nail Problem      (np) bil ingrown toenail    87 y.o. male presents with the above complaint. History confirmed with patient.  Both great toenails give him trouble  Objective:  Physical Exam: warm, good capillary refill, no trophic changes or ulcerative lesions, normal DP and PT pulses, normal sensory exam, and bilateral ingrowing incurvated toenails without paronychia appear to be mycotic with thickened and yellowed nail plates. Assessment:  No diagnosis found.   Plan:  Patient was evaluated and treated and all questions answered.  Discussed the etiology and treatment options for the condition in detail with the patient. Educated patient on the topical and oral treatment options for mycotic nails. Recommended debridement of the nails today. Sharp and mechanical debridement performed of all painful and mycotic nails today. Nails debrided in length and thickness using a nail nipper to level of comfort. Discussed treatment options including appropriate shoe gear. Follow up as needed for painful nails.    Return if symptoms worsen or fail to improve.

## 2021-09-20 ENCOUNTER — Ambulatory Visit (INDEPENDENT_AMBULATORY_CARE_PROVIDER_SITE_OTHER): Payer: Medicare Other | Admitting: Medical

## 2021-09-20 ENCOUNTER — Other Ambulatory Visit: Payer: Self-pay

## 2021-09-20 ENCOUNTER — Encounter: Payer: Self-pay | Admitting: Medical

## 2021-09-20 VITALS — BP 114/60 | HR 62 | Wt 144.8 lb

## 2021-09-20 DIAGNOSIS — I350 Nonrheumatic aortic (valve) stenosis: Secondary | ICD-10-CM | POA: Diagnosis not present

## 2021-09-20 DIAGNOSIS — N186 End stage renal disease: Secondary | ICD-10-CM

## 2021-09-20 DIAGNOSIS — I4891 Unspecified atrial fibrillation: Secondary | ICD-10-CM

## 2021-09-20 DIAGNOSIS — E039 Hypothyroidism, unspecified: Secondary | ICD-10-CM

## 2021-09-20 DIAGNOSIS — I5042 Chronic combined systolic (congestive) and diastolic (congestive) heart failure: Secondary | ICD-10-CM

## 2021-09-20 DIAGNOSIS — I10 Essential (primary) hypertension: Secondary | ICD-10-CM | POA: Diagnosis not present

## 2021-09-20 DIAGNOSIS — Z992 Dependence on renal dialysis: Secondary | ICD-10-CM

## 2021-09-20 NOTE — Patient Instructions (Signed)
Medication Instructions:  Your physician recommends that you continue on your current medications as directed. Please refer to the Current Medication list given to you today.  *If you need a refill on your cardiac medications before your next appointment, please call your pharmacy*  Lab Work: NONE ordered at this time of appointment   If you have labs (blood work) drawn today and your tests are completely normal, you will receive your results only by: [place] (if you have MyChart) OR A paper copy in the mail If you have any lab test that is abnormal or we need to change your treatment, we will call you to review the results.  Testing/Procedures: NONE ordered at this time of appointment   Follow-Up: At [facility], you and your health needs are our priority.  As part of our continuing mission to provide you with exceptional heart care, we have created designated Provider Care Teams.  These Care Teams include your primary Cardiologist (physician) and Advanced Practice Providers (APPs -  Physician Assistants and Nurse Practitioners) who all work together to provide you with the care you need, when you need it.   Your next appointment:   3 month(s)  The format for your next appointment:   In Person  Provider:   Eleonore Chiquito, MD  Other Instructions    Low-Sodium Eating Plan Sodium, which is an element that makes up salt, helps you maintain a healthy balance of fluids in your body. Too much sodium can increase your blood pressure and cause fluid and waste to be held in your body. Your health care provider or dietitian may recommend following this plan if you have high blood pressure (hypertension), kidney disease, liver disease, or heart failure. Eating less sodium can help lower your blood pressure, reduce swelling, and protect your heart, liver, and kidneys. What are tips for following this plan? Reading food labels The Nutrition Facts label lists the amount of sodium in  one serving of the food. If you eat more than one serving, you must multiply the listed amount of sodium by the number of servings. Choose foods with less than 140 mg of sodium per serving. Avoid foods with 300 mg of sodium or more per serving. Shopping  Look for lower-sodium products, often labeled as "low-sodium" or "no salt added." Always check the sodium content, even if foods are labeled as "unsalted" or "no salt added." Buy fresh foods. Avoid canned foods and pre-made or frozen meals. Avoid canned, cured, or processed meats. Buy breads that have less than 80 mg of sodium per slice. Cooking  Eat more home-cooked food and less restaurant, buffet, and fast food. Avoid adding salt when cooking. Use salt-free seasonings or herbs instead of table salt or sea salt. Check with your health care provider or pharmacist before using salt substitutes. Cook with plant-based oils, such as canola, sunflower, or olive oil. Meal planning When eating at a restaurant, ask that your food be prepared with less salt or no salt, if possible. Avoid dishes labeled as brined, pickled, cured, smoked, or made with soy sauce, miso, or teriyaki sauce. Avoid foods that contain MSG (monosodium glutamate). MSG is sometimes added to [place] food, bouillon, and some canned foods. Make meals that can be grilled, baked, poached, roasted, or steamed. These are generally made with less sodium. General information Most people on this plan should limit their sodium intake to 1,500-2,000 mg (milligrams) of sodium each day. What foods should I eat? Fruits Fresh, frozen, or canned fruit. Fruit  juice. Vegetables Fresh or frozen vegetables. "No salt added" canned vegetables. "No salt added" tomato sauce and paste. Low-sodium or reduced-sodium tomato and vegetable juice. Grains Low-sodium cereals, including oats, puffed wheat and rice, and shredded wheat. Low-sodium crackers. Unsalted rice. Unsalted pasta. Low-sodium bread.  Whole-grain breads and whole-grain pasta. Meats and other proteins Fresh or frozen (no salt added) meat, poultry, seafood, and fish. Low-sodium canned tuna and salmon. Unsalted nuts. Dried peas, beans, and lentils without added salt. Unsalted canned beans. Eggs. Unsalted nut butters. Dairy Milk. Soy milk. Cheese that is naturally low in sodium, such as ricotta cheese, fresh mozzarella, or Swiss cheese. Low-sodium or reduced-sodium cheese. Cream cheese. Yogurt. Seasonings and condiments Fresh and dried herbs and spices. Salt-free seasonings. Low-sodium mustard and ketchup. Sodium-free salad dressing. Sodium-free light mayonnaise. Fresh or refrigerated horseradish. Lemon juice. Vinegar. Other foods Homemade, reduced-sodium, or low-sodium soups. Unsalted popcorn and pretzels. Low-salt or salt-free chips. The items listed above may not be a complete list of foods and beverages you can eat. Contact a dietitian for more information. What foods should I avoid? Vegetables Sauerkraut, pickled vegetables, and relishes. Olives. [place] fries. Onion rings. Regular canned vegetables (not low-sodium or reduced-sodium). Regular canned tomato sauce and paste (not low-sodium or reduced-sodium). Regular tomato and vegetable juice (not low-sodium or reduced-sodium). Frozen vegetables in sauces. Grains Instant hot cereals. Bread stuffing, pancake, and biscuit mixes. Croutons. Seasoned rice or pasta mixes. Noodle soup cups. Boxed or frozen macaroni and cheese. Regular salted crackers. Self-rising flour. Meats and other proteins Meat or fish that is salted, canned, smoked, spiced, or pickled. Precooked or cured meat, such as sausages or meat loaves. Berniece Salines. Ham. Pepperoni. Hot dogs. Corned beef. Chipped beef. Salt pork. Jerky. Pickled herring. Anchovies and sardines. Regular canned tuna. Salted nuts. Dairy Processed cheese and cheese spreads. Hard cheeses. Cheese curds. Blue cheese. Feta cheese. String cheese. Regular  cottage cheese. Buttermilk. Canned milk. Fats and oils Salted butter. Regular margarine. Ghee. Bacon fat. Seasonings and condiments Onion salt, garlic salt, seasoned salt, table salt, and sea salt. Canned and packaged gravies. Worcestershire sauce. Tartar sauce. Barbecue sauce. Teriyaki sauce. Soy sauce, including reduced-sodium. Steak sauce. Fish sauce. Oyster sauce. Cocktail sauce. Horseradish that you find on the shelf. Regular ketchup and mustard. Meat flavorings and tenderizers. Bouillon cubes. Hot sauce. Pre-made or packaged marinades. Pre-made or packaged taco seasonings. Relishes. Regular salad dressings. Salsa. Other foods Salted popcorn and pretzels. Corn chips and puffs. Potato and tortilla chips. Canned or dried soups. Pizza. Frozen entrees and pot pies. The items listed above may not be a complete list of foods and beverages you should avoid. Contact a dietitian for more information. Summary Eating less sodium can help lower your blood pressure, reduce swelling, and protect your heart, liver, and kidneys. Most people on this plan should limit their sodium intake to 1,500-2,000 mg (milligrams) of sodium each day. Canned, boxed, and frozen foods are high in sodium. Restaurant foods, fast foods, and pizza are also very high in sodium. You also get sodium by adding salt to food. Try to cook at home, eat more fresh fruits and vegetables, and eat less fast food and canned, processed, or prepared foods. This information is not intended to replace advice given to you by your health care provider. Make sure you discuss any questions you have with your health care provider. Document Revised: 12/25/2019 Document Reviewed: 10/21/2019 Elsevier Patient Education  2022 [employer].

## 2021-11-13 ENCOUNTER — Encounter (HOSPITAL_COMMUNITY): Payer: Self-pay | Admitting: Emergency Medicine

## 2021-11-13 ENCOUNTER — Other Ambulatory Visit: Payer: Self-pay

## 2021-11-13 ENCOUNTER — Ambulatory Visit (INDEPENDENT_AMBULATORY_CARE_PROVIDER_SITE_OTHER): Payer: Medicare Other

## 2021-11-13 ENCOUNTER — Ambulatory Visit (HOSPITAL_COMMUNITY)
Admission: EM | Admit: 2021-11-13 | Discharge: 2021-11-13 | Disposition: A | Discharge status: Home / Self Care | Payer: Medicare Other | Attending: Emergency Medicine | Admitting: Emergency Medicine

## 2021-11-13 DIAGNOSIS — M79674 Pain in right toe(s): Secondary | ICD-10-CM | POA: Diagnosis not present

## 2021-11-13 MED ORDER — PREDNISONE 10 MG PO TABS: 20.0000 mg | ORAL_TABLET | Freq: Two times a day (BID) | ORAL | 0 refills | Status: AC

## 2021-11-13 NOTE — ED Triage Notes (Signed)
Pt c/o right big toe pain for about 10 days. Denies injury.

## 2021-11-13 NOTE — Discharge Instructions (Addendum)
No fracture on x-ray. Suspect gout - take prednisone as prescribed. Follow-up with PCP if no improvement in a few days or if worsening.

## 2021-11-13 NOTE — ED Provider Notes (Signed)
[place]    CSN: [id number] Arrival date & time: 11/13/21  1103      History   Chief Complaint Chief Complaint  Patient presents with   Toe Pain    HPI Lonnie [name] is a 87 y.o. male.   Patient presents with concerns of pain in his right big toe. He states he first noticed it about 10 days ago but worsened yesterday. He denies known injury but isn't sure if he hit it and didn't realize it. The patient has not taken or tried anything for it. He denies history of similar or gout.   The history is provided by the patient.  Toe Pain   Past Medical History:  Diagnosis Date   Adenomatous polyps    Bursitis    left hip   Chronic kidney disease    Stage 4   Frequent urination at night    GERD (gastroesophageal reflux disease)    Hypertension    Hypothyroidism    Pneumonia    Polyclonal gammopathy determined by serum protein electrophoresis 10/04/2015    Patient Active Problem List   Diagnosis Date Noted   Generalized weakness 08/02/2021   Hypothyroidism    Hypertension    Elevated troponin    ESRD on dialysis ([place]) 08/24/2016   Monoclonal gammopathy 10/06/2015   Gammopathy, monoclonal    Polyclonal gammopathy determined by serum protein electrophoresis 10/04/2015   Depression 03/29/2015   Intertrigo 05/30/2014   Proteinuria 05/05/2014    Past Surgical History:  Procedure Laterality Date   AORTIC ARCH ANGIOGRAPHY N/A 08/01/2017   Procedure: Aortic Arch Angiography;  Surgeon: Conrad Katy, MD;  Location: [facility];  Service: Cardiovascular;  Laterality: N/A;   AV FISTULA PLACEMENT Left 07/10/2016   Procedure: LEFT BRACHIOCEPHALIC ARTERIOVENOUS (AV) FISTULA CREATION;  Surgeon: Conrad Lima, MD;  Location: [facility];  Service: Vascular;  Laterality: Left;   COLONOSCOPY     RENAL BIOPSY Left 10/06/2015   REVISON OF ARTERIOVENOUS FISTULA Left 15/40/0867   Procedure: PLICATION OF LEFT BRACHIOCEPHALIC ARTERIOVENOUS FISTULA;  Surgeon: Conrad [place],  MD;  Location: [facility];  Service: Vascular;  Laterality: Left;   SKIN SURGERY     back   UPPER EXTREMITY ANGIOGRAPHY Left 08/01/2017   Procedure: Upper Extremity Angiography;  Surgeon: Conrad [place], MD;  Location: [facility];  Service: Cardiovascular;  Laterality: Left;       Home Medications    Prior to Admission medications   Medication Sig Start Date End Date Taking? Authorizing Provider  predniSONE (DELTASONE) 10 MG tablet Take 2 tablets (20 mg total) by mouth 2 (two) times daily with a meal for 3 days. 11/13/21 11/16/21 Yes Caydn Justen L, PA  acetaminophen (TYLENOL) 325 MG tablet Take 2 tablets (650 mg total) by mouth every 6 (six) hours as needed for mild pain. 01/26/21   Hans Eden, NP  aspirin 81 MG chewable tablet Chew 1 tablet (81 mg total) by mouth daily. 08/06/21   Thurnell Lose, MD  calcium acetate (PHOSLO) 667 MG capsule Take 1 capsule by mouth 3 (three) times daily with meals.  04/29/17   [provider]  Cyanocobalamin (VITAMIN B-12 PO) Take 1 tablet by mouth daily.    [provider]  diclofenac Sodium (VOLTAREN) 1 % GEL Apply 4 g topically 4 (four) times daily. 04/10/21   Lamptey, Myrene Galas, MD  diphenhydramine-acetaminophen (TYLENOL PM) 25-500 MG TABS tablet Take 1 tablet by mouth at bedtime as  needed (sleep).    [provider]  escitalopram (LEXAPRO) 10 MG tablet Take 10 mg by mouth at bedtime.  06/14/16   [provider]  isosorbide mononitrate (IMDUR) 30 MG 24 hr tablet Take 0.5 tablets (15 mg total) by mouth daily. 08/06/21   Thurnell Lose, MD  levothyroxine (SYNTHROID) 100 MCG tablet Take 1 tablet (100 mcg total) by mouth daily at 6 (six) AM. 08/06/21   Thurnell Lose, MD  lidocaine-prilocaine (EMLA) cream Apply 1 application topically daily as needed (port access).  02/06/17   [provider]  mirabegron ER (MYRBETRIQ) 50 MG TB24 tablet Take 50 mg by mouth at bedtime.     [provider]  multivitamin  (RENA-VIT) TABS tablet Take 1 tablet by mouth daily.    [provider]  pantoprazole (PROTONIX) 20 MG tablet Take 1 tablet (20 mg total) by mouth daily. 04/10/21   Lamptey, Myrene Galas, MD  polyethylene glycol [facility]) packet Take 17 g by mouth daily. 12/30/18   Robyn Haber, MD  polyvinyl alcohol (LIQUIFILM TEARS) 1.4 % ophthalmic solution Place 1-2 drops into both eyes as needed for dry eyes.    [provider]    Family History Family History  Problem Relation Age of Onset   Diabetes Father    Diabetes Brother    Heart disease Brother    Colon cancer Neg Hx    Rectal cancer Neg Hx     Social History Social History   Tobacco Use   Smoking status: Former    Types: Cigarettes    Quit date: 01/25/1980    Years since quitting: 41.8   Smokeless tobacco: Never  Vaping Use   Vaping Use: Never used  Substance Use Topics   Alcohol use: No    Comment: former alcoholic 32 years ago was last drink   Drug use: No     Allergies   Patient has no known allergies.   Review of Systems Review of Systems  Constitutional:  Negative for fatigue and fever.  Cardiovascular:  Negative for leg swelling.  Musculoskeletal:  Positive for arthralgias. Negative for joint swelling.  Skin:  Negative for color change and wound.  Neurological:  Negative for weakness and numbness.    Physical Exam Triage Vital Signs ED Triage Vitals  Enc Vitals Group     BP 11/13/21 1257 117/70     Pulse Rate 11/13/21 1257 64     Resp 11/13/21 1257 19     Temp 11/13/21 1257 98 F (36.7 C)     Temp Source 11/13/21 1257 Oral     SpO2 --      Weight --      Height --      Head Circumference --      Peak Flow --      Pain Score 11/13/21 1256 3     Pain Loc --      Pain Edu? --      Excl. in [place]? --    No data found.  Updated Vital Signs BP 117/70   Pulse 64   Temp 98 F (36.7 C) (Oral)   Resp 19   SpO2 93%   Visual Acuity Right Eye Distance:   Left Eye Distance:   Bilateral  Distance:    Right Eye Near:   Left Eye Near:    Bilateral Near:     Physical Exam Vitals and nursing note reviewed.  Constitutional:      General: He is not  in acute distress. HENT:     Head: Normocephalic.  Eyes:     Pupils: Pupils are equal, round, and reactive to light.  Cardiovascular:     Rate and Rhythm: Normal rate. Rhythm irregular.     Comments: Irregularly irregular - known A fib Pulmonary:     Effort: Pulmonary effort is normal.     Comments: On O2 via [place] Musculoskeletal:     Right foot: Normal capillary refill. Tenderness present.     Comments: Tenderness diffusely to R great toe - primarily IP and distal phalanx regions. Mild diffuse erythema and swelling. Normal ROM with some discomfort. Thickened hypertrophic nail, no notable purulence or increased swelling or erythema to nail folds.   Neurological:     Mental Status: He is alert.     Gait: Gait abnormal (using wheelchair (baseline)).  Psychiatric:        Mood and Affect: Mood normal.     UC Treatments / Results  Labs (all labs ordered are listed, but only abnormal results are displayed) Labs Reviewed - No data to display  EKG   Radiology DG Foot Complete Right  Result Date: 11/13/2021 CLINICAL DATA:  87 year old male with great toe pain EXAM: RIGHT FOOT COMPLETE - 3+ VIEW COMPARISON:  None. FINDINGS: No acute displaced fracture. No erosive changes. No subluxation/dislocation. Degenerative changes of the interphalangeal joints. Degenerative changes of the midfoot and hindfoot. No radiopaque foreign body. Medial calcinosis of the tibial arteries and pedal arteries. IMPRESSION: Negative for acute bony abnormality, with no evidence of osteomyelitis. Atherosclerosis with tibial and pedal disease. Electronically Signed   By: Corrie Mckusick D.O.   On: 11/13/2021 13:27    Procedures Procedures (including critical care time)  Medications Ordered in UC Medications - No data to display  Initial Impression /  Assessment and Plan / UC Course  I have reviewed the triage vital signs and the nursing notes.  Pertinent labs & imaging results that were available during my care of the patient were reviewed by me and considered in my medical decision making (see chart for details).     No fx on xray. S/s most consistent with gout given location, exam, and no injury. Will tx with short course of prednisone - avoiding NSAIDs due to renal disease, and discussed close f/u with PCP.   E/M: 1 acute uncomplicated illness, 1 data (xray), moderate risk due to prescription management  Final Clinical Impressions(s) / UC Diagnoses   Final diagnoses:  Pain of toe of right foot     Discharge Instructions      No fracture on x-ray. Suspect gout - take prednisone as prescribed. Follow-up with PCP if no improvement in a few days or if worsening.     ED Prescriptions     Medication Sig Dispense Auth. Provider   predniSONE (DELTASONE) 10 MG tablet Take 2 tablets (20 mg total) by mouth 2 (two) times daily with a meal for 3 days. 12 tablet Abner Greenspan, Shamera Yarberry L, PA      PDMP not reviewed this encounter.   Delsa Sale, [place] 11/13/21 1338

## 2021-11-30 ENCOUNTER — Ambulatory Visit (INDEPENDENT_AMBULATORY_CARE_PROVIDER_SITE_OTHER): Payer: Medicare Other | Admitting: Cardiovascular Disease

## 2021-11-30 ENCOUNTER — Other Ambulatory Visit: Payer: Self-pay

## 2021-11-30 ENCOUNTER — Encounter: Payer: Self-pay | Admitting: Cardiovascular Disease

## 2021-11-30 VITALS — BP 113/66 | HR 72 | Ht 69.0 in | Wt 137.0 lb

## 2021-11-30 DIAGNOSIS — I4510 Unspecified right bundle-branch block: Secondary | ICD-10-CM | POA: Diagnosis not present

## 2021-11-30 DIAGNOSIS — I350 Nonrheumatic aortic (valve) stenosis: Secondary | ICD-10-CM | POA: Diagnosis not present

## 2021-11-30 DIAGNOSIS — I4891 Unspecified atrial fibrillation: Secondary | ICD-10-CM

## 2021-11-30 DIAGNOSIS — I5042 Chronic combined systolic (congestive) and diastolic (congestive) heart failure: Secondary | ICD-10-CM

## 2021-11-30 DIAGNOSIS — I444 Left anterior fascicular block: Secondary | ICD-10-CM

## 2021-11-30 NOTE — Progress Notes (Signed)
Cardiology Office Note:   Date:  11/30/2021  NAME:  Lonnie [name]    MRN: [id number] DOB:  05-27-1925   PCP:  Prince Solian, MD  Cardiologist:  Evalina Field, MD  Electrophysiologist:  None   Referring MD: Prince Solian, MD   Chief Complaint  Patient presents with   Follow-up   History of Present Illness:   Lonnie [name] is a 87 y.o. male with a hx of ESRD, COPD on chronic home O2, systolic HF EF 62%, moderate to severe AS, Afib with SVR who presents for follow-up. Diagnosed with CHF in 08/2021 after mechanical fall. Found to have moderate to severe AS. Frequent falls and not candidate [facility] despite history of Afib.  She presents with his nephew.  Overall seems to be doing well.  No recent falls.  Denies any chest pain or trouble breathing.  Blood pressure 113/66.  Murmur today more consistent with moderate aortic stenosis.  He has been taken off his beta-blocker due to bradycardia.  No major limitations.  We discussed his diagnosis of congestive heart failure as well as moderate to severe aortic stenosis in the office today.  Given his advanced age and medical comorbidities medical management has been recommended.  They are in agreement.  Problem List ESRD on HD COPD/Chronic respiratory failure -4L [place] O2 3. Systolic HF -07/3661: EF 94% 4. Moderate to severe AS 5. Afib w/ SVR -beta blocker stopped due to bradycardia  -no AC due to falls 6. RBBB/LAFB  Past Medical History: Past Medical History:  Diagnosis Date   Adenomatous polyps    Bursitis    left hip   Chronic kidney disease    Stage 4   Frequent urination at night    GERD (gastroesophageal reflux disease)    Hypertension    Hypothyroidism    Pneumonia    Polyclonal gammopathy determined by serum protein electrophoresis 10/04/2015    Past Surgical History: Past Surgical History:  Procedure Laterality Date   AORTIC ARCH ANGIOGRAPHY N/A 08/01/2017   Procedure: Aortic Arch Angiography;  Surgeon: Conrad [place], MD;  Location: [facility];  Service: Cardiovascular;  Laterality: N/A;   AV FISTULA PLACEMENT Left 07/10/2016   Procedure: LEFT BRACHIOCEPHALIC ARTERIOVENOUS (AV) FISTULA CREATION;  Surgeon: Conrad [place], MD;  Location: [facility];  Service: Vascular;  Laterality: Left;   COLONOSCOPY     RENAL BIOPSY Left 10/06/2015   REVISON OF ARTERIOVENOUS FISTULA Left 76/54/6503   Procedure: PLICATION OF LEFT BRACHIOCEPHALIC ARTERIOVENOUS FISTULA;  Surgeon: Conrad [place], MD;  Location: [facility];  Service: Vascular;  Laterality: Left;   SKIN SURGERY     back   UPPER EXTREMITY ANGIOGRAPHY Left 08/01/2017   Procedure: Upper Extremity Angiography;  Surgeon: Conrad [place], MD;  Location: [facility];  Service: Cardiovascular;  Laterality: Left;    Current Medications: No outpatient medications have been marked as taking for the 11/30/21 encounter (Office Visit) with Geralynn Rile, MD.     Allergies:    Patient has no known allergies.   Social History: Social History   Socioeconomic History   Marital status: Widowed    Spouse name: Not on file   Number of children: Not on file   Years of education: Not on file   Highest education level: Not on file  Occupational History   Not on file  Tobacco Use   Smoking status: Former    Types: Cigarettes    Quit date: 01/25/1980  Years since quitting: 41.8   Smokeless tobacco: Never  Vaping Use   Vaping Use: Never used  Substance and Sexual Activity   Alcohol use: No    Comment: former alcoholic 32 years ago was last drink   Drug use: No   Sexual activity: Not on file  Other Topics Concern   Not on file  Social History Narrative   Not on file   Social Determinants of Health   Financial Resource Strain: Not on file  Food Insecurity: Not on file  Transportation Needs: Not on file  Physical Activity: Not on file  Stress: Not on file  Social Connections: Not on file     Family History: The patient's family history includes  Diabetes in his brother and father; Heart disease in his brother. There is no history of Colon cancer or Rectal cancer.  ROS:   All other ROS reviewed and negative. Pertinent positives noted in the HPI.     EKGs/Labs/Other Studies Reviewed:   The following studies were personally reviewed by me today:  EKG: EKG from August 02, 2021 reviewed.  This demonstrates A. fib with heart rate 72, right bundle branch block and left anterior fascicular block.  TTE 08/03/2021  1. Left ventricular ejection fraction by 3D volume is 20 %. The left  ventricle has severely decreased function. The left ventricle demonstrates  global hypokinesis. Left ventricular diastolic parameters are consistent  with Grade II diastolic dysfunction  (pseudonormalization).   2. Right ventricular systolic function is severely reduced. The right  ventricular size is mildly enlarged. There is moderately elevated  pulmonary artery systolic pressure. The estimated right ventricular  systolic pressure is 41.3 mmHg.   3. Left atrial size was mildly dilated.   4. The mitral valve is normal in structure. Moderate mitral valve  regurgitation. No evidence of mitral stenosis.   5. Tricuspid valve regurgitation is moderate.   6. The aortic valve is tricuspid. Aortic valve regurgitation is not  visualized. Moderate aortic valve stenosis. Aortic valve area, by VTI  measures 1.21 cm. Aortic valve mean gradient measures 19.0 mmHg. Aortic  valve Vmax measures 2.76 m/s.   7. The inferior vena cava is normal in size with greater than 50%  respiratory variability, suggesting right atrial pressure of 3 mmHg.   Recent Labs: 08/02/2021: B Natriuretic Peptide >4,500.0 08/04/2021: Hemoglobin 10.8; Platelets 257; TSH 15.435 08/05/2021: ALT 19; BUN 36; Creatinine, Ser 3.86; Potassium 4.1; Sodium 134   Recent Lipid Panel No results found for: CHOL, TRIG, HDL, CHOLHDL, VLDL, LDLCALC, LDLDIRECT  Physical Exam:   VS:  BP 113/66    Pulse 72    Ht 5'  9" (1.753 m)    Wt 137 lb (62.1 kg)    BMI 20.23 kg/m    Wt Readings from Last 3 Encounters:  11/30/21 137 lb (62.1 kg)  09/20/21 144 lb 12.8 oz (65.7 kg)  08/08/21 144 lb (65.3 kg)    General: Well nourished, well developed, in no acute distress Head: Atraumatic, normal size  Eyes: PEERLA, EOMI  Neck: Supple, no JVD Endocrine: No thryomegaly Cardiac: Normal S1, irregular rhythm, 3 out of 6 systolic ejection murmur, S2 is heard Lungs: Clear to auscultation bilaterally, no wheezing, rhonchi or rales  Abd: Soft, nontender, no hepatomegaly  Ext: No edema, pulses 2+ Musculoskeletal: No deformities, BUE and BLE strength normal and equal Skin: Warm and dry, no rashes   Neuro: Alert and oriented to person, place, time, and situation, CNII-XII grossly intact, no focal deficits  Psych: Normal mood and affect   ASSESSMENT:   Lonnie [name] is a 87 y.o. male who presents for the following: 1. Chronic combined systolic and diastolic CHF (congestive heart failure) ([place])   2. Atrial fibrillation with slow ventricular response ([place])   3. Aortic stenosis, moderate   4. RBBB   5. LAFB (left anterior fascicular block)     PLAN:   1. Chronic combined systolic and diastolic CHF (congestive heart failure) ([place]) -He was diagnosed with systolic heart failure after a fall and weakness in September 2022.  He has ESRD and is on dialysis.  He has chronic respiratory failure on 4 L of oxygen.  He is frail and quite debilitated.  He is not that active.  Medical management was recommended and I agree with this.  He is not a candidate for aggressive cardiovascular care. -Not on a beta-blocker due to A. fib with SVR. -No ACE/ARB/Arni/MRA given ESRD. -Blood pressure well controlled.  Continue Imdur 15 mg daily.  He is also on hydralazine at home. -Volume control by dialysis. -We overall discussed his prognosis.  It is poor.  I recommended DNR/DNI.  He is in agreement with this.  2. Atrial fibrillation with  slow ventricular response ([facility]) -A. fib with SVR on recent EKGs.  No symptoms of dizziness or lightheadedness.  Beta-blocker has been stopped. -We will continue to withhold AV nodal agents.  No indication for this. -Not on anticoagulation due to falls and bleeding risk.  I agree with this.  3. Aortic stenosis, moderate -Murmur today consistent with moderate aortic stenosis.  He may have moderate to severe aortic stenosis but given comorbidities he is not a candidate for aggressive treatments.  Medical management is recommended.  4. RBBB 5. LAFB (left anterior fascicular block) -He does have evidence of conduction disease.  He has A. fib with SVR.  Beta-blocker has been stopped.  No symptoms from this.  We will continue to monitor this  Disposition: Return in about 6 months (around 05/31/2022).  Medication Adjustments/Labs and Tests Ordered: Current medicines are reviewed at length with the patient today.  Concerns regarding medicines are outlined above.  No orders of the defined types were placed in this encounter.  No orders of the defined types were placed in this encounter.   Patient Instructions  Medication Instructions:  The current medical regimen is effective;  continue present plan and medications.  *If you need a refill on your cardiac medications before your next appointment, please call your pharmacy*  Follow-Up: At [facility], you and your health needs are our priority.  As part of our continuing mission to provide you with exceptional heart care, we have created designated Provider Care Teams.  These Care Teams include your primary Cardiologist (physician) and Advanced Practice Providers (APPs -  Physician Assistants and Nurse Practitioners) who all work together to provide you with the care you need, when you need it.  We recommend signing up for the patient portal called "MyChart".  Sign up information is provided on this After Visit Summary.  MyChart is used to  connect with patients for Virtual Visits (Telemedicine).  Patients are able to view lab/test results, encounter notes, upcoming appointments, etc.  Non-urgent messages can be sent to your provider as well.   To learn more about what you can do with MyChart, go to [email or web address].    Your next appointment:   6 month(s)  The format for your next appointment:   In Person  Provider:  Sande Rives, PA-C or Almyra Deforest, [place]  or Evalina Field, MD  }      Time Spent with Patient: I have spent a total of 25 minutes with patient reviewing hospital notes, telemetry, EKGs, labs and examining the patient as well as establishing an assessment and plan that was discussed with the patient.  > 50% of time was spent in direct patient care.  Signed, Addison Naegeli. Audie Box, MD, [place]  [address] [place], [place] [address] [phone]  11/30/2021 11:18 AM

## 2021-11-30 NOTE — Patient Instructions (Signed)
Medication Instructions:  The current medical regimen is effective;  continue present plan and medications.  *If you need a refill on your cardiac medications before your next appointment, please call your pharmacy*  Follow-Up: At [facility], you and your health needs are our priority.  As part of our continuing mission to provide you with exceptional heart care, we have created designated Provider Care Teams.  These Care Teams include your primary Cardiologist (physician) and Advanced Practice Providers (APPs -  Physician Assistants and Nurse Practitioners) who all work together to provide you with the care you need, when you need it.  We recommend signing up for the patient portal called "MyChart".  Sign up information is provided on this After Visit Summary.  MyChart is used to connect with patients for Virtual Visits (Telemedicine).  Patients are able to view lab/test results, encounter notes, upcoming appointments, etc.  Non-urgent messages can be sent to your provider as well.   To learn more about what you can do with MyChart, go to [email or web address].    Your next appointment:   6 month(s)  The format for your next appointment:   In Person  Provider:   Sande Rives, PA-C or Almyra Deforest, PA-C  or Evalina Field, MD  }

## 2021-12-25 ENCOUNTER — Other Ambulatory Visit: Payer: Self-pay | Admitting: Internal Medicine

## 2021-12-27 ENCOUNTER — Other Ambulatory Visit (HOSPITAL_COMMUNITY): Payer: Self-pay

## 2021-12-27 DIAGNOSIS — R1310 Dysphagia, unspecified: Secondary | ICD-10-CM

## 2021-12-28 ENCOUNTER — Other Ambulatory Visit: Payer: Self-pay

## 2021-12-28 ENCOUNTER — Emergency Department (HOSPITAL_BASED_OUTPATIENT_CLINIC_OR_DEPARTMENT_OTHER): Payer: Medicare Other

## 2021-12-28 ENCOUNTER — Encounter (HOSPITAL_BASED_OUTPATIENT_CLINIC_OR_DEPARTMENT_OTHER): Payer: Self-pay | Admitting: Radiology

## 2021-12-28 ENCOUNTER — Inpatient Hospital Stay (HOSPITAL_BASED_OUTPATIENT_CLINIC_OR_DEPARTMENT_OTHER)
Admission: EM | Admit: 2021-12-28 | Discharge: 2022-01-01 | DRG: 377 | Disposition: A | Discharge status: Home Health | Payer: Medicare Other | Attending: Internal Medicine | Admitting: Internal Medicine

## 2021-12-28 DIAGNOSIS — I5082 Biventricular heart failure: Secondary | ICD-10-CM | POA: Diagnosis present

## 2021-12-28 DIAGNOSIS — K922 Gastrointestinal hemorrhage, unspecified: Secondary | ICD-10-CM | POA: Diagnosis present

## 2021-12-28 DIAGNOSIS — J9611 Chronic respiratory failure with hypoxia: Secondary | ICD-10-CM | POA: Diagnosis present

## 2021-12-28 DIAGNOSIS — I959 Hypotension, unspecified: Secondary | ICD-10-CM | POA: Diagnosis not present

## 2021-12-28 DIAGNOSIS — Z833 Family history of diabetes mellitus: Secondary | ICD-10-CM

## 2021-12-28 DIAGNOSIS — D649 Anemia, unspecified: Secondary | ICD-10-CM | POA: Diagnosis present

## 2021-12-28 DIAGNOSIS — E875 Hyperkalemia: Secondary | ICD-10-CM | POA: Diagnosis present

## 2021-12-28 DIAGNOSIS — R54 Age-related physical debility: Secondary | ICD-10-CM | POA: Diagnosis present

## 2021-12-28 DIAGNOSIS — F419 Anxiety disorder, unspecified: Secondary | ICD-10-CM | POA: Diagnosis present

## 2021-12-28 DIAGNOSIS — D62 Acute posthemorrhagic anemia: Secondary | ICD-10-CM | POA: Diagnosis not present

## 2021-12-28 DIAGNOSIS — D631 Anemia in chronic kidney disease: Secondary | ICD-10-CM | POA: Diagnosis not present

## 2021-12-28 DIAGNOSIS — E039 Hypothyroidism, unspecified: Secondary | ICD-10-CM | POA: Diagnosis present

## 2021-12-28 DIAGNOSIS — Z79890 Hormone replacement therapy (postmenopausal): Secondary | ICD-10-CM

## 2021-12-28 DIAGNOSIS — D72829 Elevated white blood cell count, unspecified: Secondary | ICD-10-CM | POA: Diagnosis present

## 2021-12-28 DIAGNOSIS — Z66 Do not resuscitate: Secondary | ICD-10-CM | POA: Diagnosis present

## 2021-12-28 DIAGNOSIS — Z20822 Contact with and (suspected) exposure to covid-19: Secondary | ICD-10-CM | POA: Diagnosis present

## 2021-12-28 DIAGNOSIS — Z87891 Personal history of nicotine dependence: Secondary | ICD-10-CM | POA: Diagnosis not present

## 2021-12-28 DIAGNOSIS — I5042 Chronic combined systolic (congestive) and diastolic (congestive) heart failure: Secondary | ICD-10-CM | POA: Diagnosis present

## 2021-12-28 DIAGNOSIS — J449 Chronic obstructive pulmonary disease, unspecified: Secondary | ICD-10-CM | POA: Diagnosis present

## 2021-12-28 DIAGNOSIS — D500 Iron deficiency anemia secondary to blood loss (chronic): Secondary | ICD-10-CM | POA: Diagnosis not present

## 2021-12-28 DIAGNOSIS — N2581 Secondary hyperparathyroidism of renal origin: Secondary | ICD-10-CM | POA: Diagnosis present

## 2021-12-28 DIAGNOSIS — K921 Melena: Secondary | ICD-10-CM

## 2021-12-28 DIAGNOSIS — K219 Gastro-esophageal reflux disease without esophagitis: Secondary | ICD-10-CM | POA: Diagnosis present

## 2021-12-28 DIAGNOSIS — I132 Hypertensive heart and chronic kidney disease with heart failure and with stage 5 chronic kidney disease, or end stage renal disease: Secondary | ICD-10-CM | POA: Diagnosis not present

## 2021-12-28 DIAGNOSIS — F32A Depression, unspecified: Secondary | ICD-10-CM | POA: Diagnosis present

## 2021-12-28 DIAGNOSIS — Z8249 Family history of ischemic heart disease and other diseases of the circulatory system: Secondary | ICD-10-CM

## 2021-12-28 DIAGNOSIS — K5731 Diverticulosis of large intestine without perforation or abscess with bleeding: Principal | ICD-10-CM | POA: Diagnosis present

## 2021-12-28 DIAGNOSIS — Z992 Dependence on renal dialysis: Secondary | ICD-10-CM

## 2021-12-28 DIAGNOSIS — Z79899 Other long term (current) drug therapy: Secondary | ICD-10-CM

## 2021-12-28 DIAGNOSIS — Z86010 Personal history of colonic polyps: Secondary | ICD-10-CM

## 2021-12-28 DIAGNOSIS — Z7982 Long term (current) use of aspirin: Secondary | ICD-10-CM

## 2021-12-28 DIAGNOSIS — I452 Bifascicular block: Secondary | ICD-10-CM | POA: Diagnosis present

## 2021-12-28 DIAGNOSIS — M898X9 Other specified disorders of bone, unspecified site: Secondary | ICD-10-CM | POA: Diagnosis present

## 2021-12-28 DIAGNOSIS — I4891 Unspecified atrial fibrillation: Secondary | ICD-10-CM | POA: Diagnosis present

## 2021-12-28 DIAGNOSIS — N186 End stage renal disease: Secondary | ICD-10-CM | POA: Diagnosis present

## 2021-12-28 DIAGNOSIS — J309 Allergic rhinitis, unspecified: Secondary | ICD-10-CM | POA: Diagnosis present

## 2021-12-28 LAB — COMPREHENSIVE METABOLIC PANEL
ALT: 13 U/L (ref 0–44)
AST: 24 U/L (ref 15–41)
Albumin: 3.5 g/dL (ref 3.5–5.0)
Alkaline Phosphatase: 133 U/L — ABNORMAL HIGH (ref 38–126)
Anion gap: 16 — ABNORMAL HIGH (ref 5–15)
BUN: 123 mg/dL — ABNORMAL HIGH (ref 8–23)
CO2: 28 mmol/L (ref 22–32)
Calcium: 9.3 mg/dL (ref 8.9–10.3)
Chloride: 98 mmol/L (ref 98–111)
Creatinine, Ser: 5.84 mg/dL — ABNORMAL HIGH (ref 0.61–1.24)
GFR, Estimated: 8 mL/min — ABNORMAL LOW (ref >60)
Glucose, Bld: 96 mg/dL (ref 70–99)
Potassium: 5.5 mmol/L — ABNORMAL HIGH (ref 3.5–5.1)
Sodium: 142 mmol/L (ref 135–145)
Total Bilirubin: 0.5 mg/dL (ref 0.3–1.2)
Total Protein: 7.2 g/dL (ref 6.5–8.1)

## 2021-12-28 LAB — CBC
HCT: 28.9 % — ABNORMAL LOW (ref 39.0–52.0)
Hemoglobin: 9 g/dL — ABNORMAL LOW (ref 13.0–17.0)
MCH: 32.7 pg (ref 26.0–34.0)
MCHC: 31.1 g/dL (ref 30.0–36.0)
MCV: 105.1 fL — ABNORMAL HIGH (ref 80.0–100.0)
Platelets: 265 10*3/uL (ref 150–400)
RBC: 2.75 MIL/uL — ABNORMAL LOW (ref 4.22–5.81)
RDW: 15.8 % — ABNORMAL HIGH (ref 11.5–15.5)
WBC: 11.2 10*3/uL — ABNORMAL HIGH (ref 4.0–10.5)
nRBC: 0 % (ref 0.0–0.2)

## 2021-12-28 LAB — RETICULOCYTES
Immature Retic Fract: 16.1 % — ABNORMAL HIGH (ref 2.3–15.9)
RBC.: 2.78 MIL/uL — ABNORMAL LOW (ref 4.22–5.81)
Retic Count, Absolute: 50.5 10*3/uL (ref 19.0–186.0)
Retic Ct Pct: 1.8 % (ref 0.4–3.1)

## 2021-12-28 LAB — FOLATE: Folate: 42.9 ng/mL (ref >5.9)

## 2021-12-28 LAB — FERRITIN: Ferritin: 1083 ng/mL — ABNORMAL HIGH (ref 24–336)

## 2021-12-28 LAB — RESP PANEL BY RT-PCR (FLU A&B, COVID) ARPGX2
Influenza A by PCR: NEGATIVE
Influenza B by PCR: NEGATIVE
SARS Coronavirus 2 by RT PCR: NEGATIVE

## 2021-12-28 LAB — IRON AND TIBC
Iron: 63 ug/dL (ref 45–182)
Saturation Ratios: 31 % (ref 17.9–39.5)
TIBC: 206 ug/dL — ABNORMAL LOW (ref 250–450)
UIBC: 143 ug/dL

## 2021-12-28 LAB — OCCULT BLOOD X 1 CARD TO LAB, STOOL: Fecal Occult Bld: POSITIVE — AB

## 2021-12-28 LAB — PROTIME-INR
INR: 1.1 (ref 0.8–1.2)
Prothrombin Time: 14 seconds (ref 11.4–15.2)

## 2021-12-28 LAB — HEMOGLOBIN AND HEMATOCRIT, BLOOD
HCT: 25.4 % — ABNORMAL LOW (ref 39.0–52.0)
Hemoglobin: 8.2 g/dL — ABNORMAL LOW (ref 13.0–17.0)

## 2021-12-28 MED ORDER — SODIUM CHLORIDE 0.9 % IV BOLUS
500.0000 mL | Freq: Once | INTRAVENOUS | Status: AC
Start: 2021-12-28 — End: 2021-12-28
  Administered 2021-12-28: 500 mL via INTRAVENOUS

## 2021-12-28 MED ORDER — PANTOPRAZOLE SODIUM 40 MG IV SOLR
40.0000 mg | Freq: Two times a day (BID) | INTRAVENOUS | Status: DC
  Administered 2021-12-29 – 2022-01-01 (×7): 40 mg via INTRAVENOUS
  Filled 2021-12-28 (×8): qty 40

## 2021-12-28 MED ORDER — ACETAMINOPHEN 325 MG PO TABS
650.0000 mg | ORAL_TABLET | Freq: Four times a day (QID) | ORAL | Status: DC | PRN
  Administered 2022-01-01: 650 mg via ORAL
  Filled 2021-12-28: qty 2

## 2021-12-28 MED ORDER — PANTOPRAZOLE SODIUM 40 MG IV SOLR
INTRAVENOUS | Status: AC
  Administered 2021-12-28: 80 mg
  Filled 2021-12-28: qty 80

## 2021-12-28 MED ORDER — PANTOPRAZOLE 80MG IVPB - SIMPLE MED
80.0000 mg | Freq: Once | INTRAVENOUS | Status: AC
  Administered 2021-12-28: 80 mg via INTRAVENOUS
  Filled 2021-12-28: qty 100

## 2021-12-28 MED ORDER — IOHEXOL 350 MG/ML SOLN
75.0000 mL | Freq: Once | INTRAVENOUS | Status: AC | PRN
  Administered 2021-12-28: 75 mL via INTRAVENOUS

## 2021-12-28 MED ORDER — ACETAMINOPHEN 650 MG RE SUPP: 650.0000 mg | Freq: Four times a day (QID) | RECTAL | Status: DC | PRN

## 2021-12-28 NOTE — ED Triage Notes (Addendum)
Pt to ED c/o rectal bleeding that started yesterday morning. Caretaker noticed dark red blood in stool when she was getting him ready for a bath. Reports normal BM, No nausea/vomiting. Reports increased weakness, stayed in bed yesterday and missed dialysis. MWF Dialysis pt. Recently prescribed prednisone.  Home o2 user 2.5L. Dialysis patient.

## 2021-12-28 NOTE — ED Provider Notes (Signed)
Emergency Department Provider Note   I have reviewed the triage vital signs and the nursing notes.   HISTORY  Chief Complaint Rectal Bleeding   HPI Lonnie [name] is a 88 y.o. male with past medical history reviewed below including ESRD on dialysis presents with caregiver noticing blood in the bowel movements.  Symptoms were at their worst yesterday with several large volume, maroon to dark-colored stools.  That has continued today although the amount of blood appears less, according to caregiver.  The patient denies any symptoms.  He has some chronic shortness of breath and is on baseline home oxygen.  He denies any abdominal or rectal pain.  No fevers or chills.  He is not feeling fatigued or lightheaded with standing or movement.  Has seen [facility] in the past but has been many years.  He takes a baby aspirin daily but is not otherwise anticoagulated.   Past Medical History:  Diagnosis Date   Adenomatous polyps    Bursitis    left hip   Chronic kidney disease    Stage 4   Frequent urination at night    GERD (gastroesophageal reflux disease)    Hypertension    Hypothyroidism    Pneumonia    Polyclonal gammopathy determined by serum protein electrophoresis 10/04/2015    Review of Systems  Constitutional: No fever/chills Eyes: No visual changes. ENT: No sore throat. Cardiovascular: Denies chest pain. Respiratory: Chronic shortness of breath. Gastrointestinal: No abdominal pain.  No nausea, no vomiting.  No diarrhea.  No constipation. Positive GI bleeding.  Genitourinary: Negative for dysuria. Musculoskeletal: Negative for back pain. Skin: Negative for rash. Neurological: Negative for headaches, focal weakness or numbness.   ____________________________________________   PHYSICAL EXAM:  VITAL SIGNS: ED Triage Vitals  Enc Vitals Group     BP 12/28/21 1041 116/73     Pulse Rate 12/28/21 1041 75     Resp 12/28/21 1041 18     Temp 12/28/21 1041 97.8 F (36.6  C)     Temp src --      SpO2 12/28/21 1041 100 %     Weight 12/28/21 1038 143 lb (64.9 kg)     Height 12/28/21 1038 5\' 7"  (1.702 m)   Constitutional: Alert and oriented. Well appearing and in no acute distress. Eyes: Conjunctivae are normal.  Head: Atraumatic. Nose: No congestion/rhinnorhea. Mouth/Throat: Mucous membranes are moist.   Neck: No stridor.   Cardiovascular: Normal rate, regular rhythm. Good peripheral circulation. Grossly normal heart sounds.   Respiratory: Normal respiratory effort.  No retractions. Lungs CTAB. Gastrointestinal: Soft and nontender. No distention. Rectal exam performed with patient's verbal consent and nurse chaperone. Melena on exam with some maroon colored component to stool.  Musculoskeletal: No lower extremity tenderness nor edema. No gross deformities of extremities. Neurologic:  Normal speech and language. No gross focal neurologic deficits are appreciated.  Skin:  Skin is warm, dry and intact. No rash noted.  ____________________________________________   LABS (all labs ordered are listed, but only abnormal results are displayed)  Labs Reviewed  COMPREHENSIVE METABOLIC PANEL - Abnormal; Notable for the following components:      Result Value   Potassium 5.5 (*)    BUN 123 (*)    Creatinine, Ser 5.84 (*)    Alkaline Phosphatase 133 (*)    GFR, Estimated 8 (*)    Anion gap 16 (*)    All other components within normal limits  CBC - Abnormal; Notable for the following components:  WBC 11.2 (*)    RBC 2.75 (*)    Hemoglobin 9.0 (*)    HCT 28.9 (*)    MCV 105.1 (*)    RDW 15.8 (*)    All other components within normal limits  OCCULT BLOOD X 1 CARD TO LAB, STOOL - Abnormal; Notable for the following components:   Fecal Occult Bld POSITIVE (*)    All other components within normal limits  PROTIME-INR  HEMOGLOBIN AND HEMATOCRIT, BLOOD  IRON AND TIBC  FERRITIN  RETICULOCYTES      ____________________________________________  RADIOLOGY  CT ANGIO GI BLEED  Result Date: 12/28/2021 CLINICAL DATA:  Evaluate for mesenteric ischemia and diverticular bleeding. EXAM: CTA ABDOMEN AND PELVIS WITHOUT AND WITH CONTRAST TECHNIQUE: Multidetector CT imaging of the abdomen and pelvis was performed using the standard protocol during bolus administration of intravenous contrast. Multiplanar reconstructed images and MIPs were obtained and reviewed to evaluate the vascular anatomy. RADIATION DOSE REDUCTION: This exam was performed according to the departmental dose-optimization program which includes automated exposure control, adjustment of the mA and/or kV according to patient size and/or use of iterative reconstruction technique. CONTRAST:  30mL OMNIPAQUE IOHEXOL 350 MG/ML SOLN COMPARISON:  None. FINDINGS: VASCULAR Aorta: Diffuse atherosclerotic calcifications in the abdominal aorta without aneurysm, dissection or significant stenosis. Aorta at the level celiac trunk measures 2.8 cm. Celiac: Mild-to-moderate stenosis at the origin of the celiac trunk due to calcified plaque. There is ectasia or poststenotic dilatation of the celiac trunk measuring up to 1.1 cm. Splenic artery is heavily calcified. Common hepatic artery is patent. SMA: Calcified plaque at the origin of the SMA without significant stenosis. Areas of mild narrowing in the main SMA. Diffuse atherosclerotic disease in the SMA branches. No evidence for acute thrombus. Renals: Atherosclerotic disease involving bilateral renal arteries. Prominent noncalcified plaque in the mid left renal artery could be causing moderate to severe stenosis in this area. Early branching of the right renal artery with areas of stenosis and atherosclerotic disease beyond the branch point. No evidence for aneurysm or dissections involving the renal arteries. IMA: Patent Inflow: Common, internal and external iliac arteries are patent bilaterally with diffuse  atherosclerotic calcifications. Mild stenosis near the origin of the left internal iliac artery. Proximal Outflow: Proximal femoral arteries are patent bilaterally. Veins: Normal appearance the IVC and iliac veins. Main portal venous system is patent. Review of the MIP images confirms the above findings. NON-VASCULAR Lower chest: Bilateral pleural effusions. Pleural effusions are small in size. Compressive atelectasis or consolidation in the right lower lobe. Pleural-based densities in the right middle lobe are nonspecific. 4 mm nodule in the left lower lobe on sequence 17 image 17 is probably calcified and suggestive for a calcified granuloma. Suspect underlying emphysema. Enhancement or mild pleural thickening associated with these pleural effusions. Hepatobiliary: Normal appearance of the liver. Mild gallbladder distension without inflammatory changes. Pancreas: Round low-density structure at the pancreatic head on sequence 7 image 72 measures 1.2 cm. This is suggestive for a small cystic lesion. No evidence for pancreatic duct dilatation or pancreatic inflammation. Spleen: Normal in size without focal abnormality. Adrenals/Urinary Tract: Adrenal glands are within the normal limits. Both kidneys are very atrophic. Multiple bilateral renal cysts. Negative for left hydronephrosis. There is fullness of the right renal pelvis without significant right caliceal dilatation. Ureters are not dilated. Bladder is decompressed but there is a diffuse bladder wall thickening which could be associated with under distension. Stomach/Bowel: Diverticula in the sigmoid colon without acute inflammation. No evidence for  active GI bleeding. Normal appearance of the stomach. No bowel distention or obstruction. Lymphatic: No lymph node enlargement in the abdomen or pelvis. Reproductive: Prostate is unremarkable. Other: Negative for ascites or free air. Mild mesenteric stranding is nonspecific. Ventral hernia near the midline containing  fat. Mild subcutaneous edema. Musculoskeletal: Multilevel disc space narrowing in lumbar spine. Mild anterolisthesis of L4 on L5. IMPRESSION: VASCULAR 1. No evidence for active GI bleeding. 2. Diffuse atherosclerotic disease in the abdomen and pelvis. Stenosis at the origin of the celiac trunk and bilateral renal arteries. 3.  Aortic Atherosclerosis (ICD10-I70.0). NON-VASCULAR 1. Colonic diverticulosis without acute bowel inflammation. 2. Bilateral pleural effusions with mild pleural thickening bilaterally. Extensive volume loss in the right lower lobe of uncertain etiology or chronicity. In addition, there is irregular pleural-based thickening along the right middle lobe. These pleural effusions are small in size. There may be underlying emphysema. These chest findings could be better evaluated with a dedicated chest CT. 3. Bilateral renal atrophy with bilateral renal cysts. Dilatation of the right renal pelvis and suspect this is a chronic finding. 4. 1.2 cm low-density structure near the pancreatic head. This is suggestive for a small pancreatic cystic lesion. Consider follow-up imaging in 2 years. Electronically Signed   By: Markus Daft M.D.   On: 12/28/2021 13:41    ____________________________________________   PROCEDURES  Procedure(s) performed:   Procedures  CRITICAL CARE Performed by: Margette Fast Total critical care time: 35 minutes Critical care time was exclusive of separately billable procedures and treating other patients. Critical care was necessary to treat or prevent imminent or life-threatening deterioration. Critical care was time spent personally by me on the following activities: development of treatment plan with patient and/or surrogate as well as nursing, discussions with consultants, evaluation of patient's response to treatment, examination of patient, obtaining history from patient or surrogate, ordering and performing treatments and interventions, ordering and review of  laboratory studies, ordering and review of radiographic studies, pulse oximetry and re-evaluation of patient's condition.  Nanda Quinton, MD Emergency Medicine  ____________________________________________   INITIAL IMPRESSION / ASSESSMENT AND PLAN / ED COURSE  Pertinent labs & imaging results that were available during my care of the patient were reviewed by me and considered in my medical decision making (see chart for details).   This patient is Presenting for Evaluation of GI bleeding, which does require a range of treatment options, and is a complaint that involves a high risk of morbidity and mortality.  The Differential Diagnoses include PUD, gastritis, esophageal variceal bleed, diverticular bleeding, colitis, AVM.  Critical Interventions- IVF and Protonix   Medications  pantoprazole (PROTONIX) injection 40 mg (has no administration in time range)  sodium chloride 0.9 % bolus 500 mL (0 mLs Intravenous Stopped 12/28/21 1313)  pantoprazole (PROTONIX) 80 mg /NS 100 mL IVPB (0 mg Intravenous Stopped 12/28/21 1319)  pantoprazole (PROTONIX) 40 MG injection (80 mg  Given 12/28/21 1226)  iohexol (OMNIPAQUE) 350 MG/ML injection 75 mL (75 mLs Intravenous Contrast Given 12/28/21 1156)    Reassessment after intervention: Patient remains awake, alert, and HDS. No symptoms currently. On home O2.    I did obtain Additional Historical Information from caregiver and sob who confirm history and timeline.   I decided to review pertinent External Data, and in summary patient last seen by Dr. Henrene Pastor in 2013. No recent ED visits. No anticoagulation on med lest here.   Clinical Laboratory Tests Ordered, included hemoccult positive, Hb trending down from baseline around 10.5 now at  9.0. ESRD noted with mild hyperkalemia.   Radiologic Tests Ordered, included CTA bleed scan. I independently interpreted the images and agree with radiology interpretation.   Cardiac Monitor Tracing which shows NSR.     Social Determinants of Health Risk remote smoking history.   Reevaluation with update and discussion with patient and family at bedside. No acute bleed on CT. Chronic lung findings. Plan for admit for protonix, H/H trending, and GI evaluation.   Consult complete with [facility] and Hospitalist.   [facility] to consult once the patient arrives to [facility]. Primary team asked to make them aware.   Discussed patient's case with [facility], Dr. Roosevelt Locks to request admission. Patient and family (if present) updated with plan.   Medical Decision Making: Summary:  Patient presents to the ED with GI bleeding. Location of bleed is unclear. Patient is not anticoagulated. CTA bleed scan evaluated as above. No active hemorrhage. Discussed with GI as above. Plan for admit and they will consult. Patient due for HD tomorrow (Friday). Mild hyperkalemia here but no indication for emergent HD. If H/H continue to down-trend may need PRBC transfusion.   Disposition: admit  ____________________________________________  FINAL CLINICAL IMPRESSION(S) / ED DIAGNOSES  Final diagnoses:  Gastrointestinal hemorrhage with melena     Note:  This document was prepared using Dragon voice recognition software and may include unintentional dictation errors.  Nanda Quinton, MD, [facility] Emergency Medicine    Niambi Smoak, [facility], MD 12/28/21 1524

## 2021-12-28 NOTE — ED Notes (Signed)
Only 80 mg of protonix given in 100 mL bag ONCE. Consulted with Apolonio Schneiders in [place] before admin. Double checked with Lil Rn on [place]. Narrator makes it look like given twice due to how medication was  scanned in

## 2021-12-28 NOTE — ED Notes (Signed)
Report called to care link 

## 2021-12-28 NOTE — ED Notes (Signed)
Pt showed to to be in Vent arrhythmia. EKG shot MD notified and given print out

## 2021-12-28 NOTE — ED Notes (Signed)
Patient transported to CT 

## 2021-12-28 NOTE — ED Notes (Signed)
Attempted to call report. RN will call back. 

## 2021-12-28 NOTE — H&P (Signed)
History and Physical    PLEASE NOTE THAT [employer]AGON [employer] SOFTWARE WAS USED IN THE CONSTRUCTION OF THIS NOTE.   Lonnie [name] VOJ:[id number] DOB: 1925/06/30 DOA: 12/28/2021  PCP: Lonnie Solian, MD  Patient coming from: home   I have personally briefly reviewed patient's old medical records in [place]  Chief Complaint: Maroon-colored stool  HPI: Lonnie [name] is a 88 y.o. male with medical history significant for end-stage renal disease on hemodialysis on Monday, Wednesday, Friday, chronic anemia with baseline hemoglobin 10-11, GERD, acquired hypothyroidism, chronic systolic/diastolic heart failure, chronic hypoxic respiratory failure on 2 to 2.5 L continuous nasal cannula, who is admitted to [facility] on 12/28/2021 by way of transfer from [facility] emergency department with suspected acute upper gastrointestinal bleed after presenting from home to [facility] ED complaining of maroon-colored stool.   The patient reports 1 day of maroon-colored stool in the absence of any associated hematochezia.  He noted first episode of maroon-colored stools starting on 12/27/2021, with a total of 3 such episodes associated with well-formed stool over the course of 12/27/2021.  He conveys that the frequency of his maroon-colored stool is decreasing, noting only 1 episode of such over the course of 12/28/2021 thus far.  However, given the presence of this 1 additional episode maroon-colored stool after the 3 episodes that he experienced yesterday, he elected to present to [facility] emergency department for further evaluation and management thereof.   Not associated with abdominal pain, N/V, hematemesis, loose stool, and denies any preceding trauma.  He notes chronic shortness of breath for which she is on 2 to 2.5 L continuous nasal cannula, but denies any worsening of his shortness of breath relative to this baseline.  Denies any recent CP, palpitations, diaphoresis, dizziness, presyncope,  or syncope. Denies any recent travel or any associated subjective fever, chills, rigors, generalized myalgias, rash. No hemoptysis.   He does not believe that he has a prior history of gastrointestinal bleed, noting that he had followed with low-power gastroenterology for his routine colonoscopies, and, per chart review, it appears that he most recently saw Dr. Henrene Pastor of [facility] gastroenterology in 2013 for this purpose.    He denies any regular or recent consumption of alcohol, noting that his most recent alcohol consumption occurred approximately 60 years ago.  He also denies any known history of underlying chronic liver disease.  Confirms daily use of a baby aspirin, but otherwise on no blood thinners or formal anticoagulation as an outpatient.  No regular or recent use of NSAIDs.  Per chart review, it appears that the patient was recently on prednisone therapy, although the patient does not recall the specific indication for this at this time.  Acknowledges history of GERD and reports good compliance with outpatient Protonix.   Denies any personal history of malignancy, and denies any recent unintentional weight loss, dysphagia, or early satiety.  Per chart review, the patient has a history of chronic anemia it appears that his baseline hemoglobin is 10-11, with most recent prior hemoglobin data point noted to be 10.8 on 08/04/2021.  Medical history notable for end-stage renal disease on hemodialysis, Monday, Wednesday, Friday schedule, noting that most recent hemodialysis session occurred yesterday, on 12/27/2021, and confirms that he is due for his next routine scheduled hemodialysis session tomorrow, 12/29/2021.  In this context, he confirms that he is an uric at baseline.  Medical history also notable for chronic systolic/diastolic heart failure as well as chronic biventricular heart failure, with most recent echocardiogram in  September 2022 notable for LVEF 20% with global left ventricular  hypokinesis, grade 2 diastolic dysfunction, and right ventricular demonstrating systolic function severely reduced.      [facility] ED Course:  Vital signs in the ED were notable for the following: Afebrile; heart rate 67-70; blood pressure 110/53 - 123/52, respiratory rate 17-20, oxygen saturation 100% on baseline 2 L nasal cannula.  Labs were notable for the following: CMP notable for the following: Sodium 142, potassium 5.5, bicarbonate 28, glucose 96.  CBC notable for white blood cell count 11,200, relative to most recent prior value of 6.8 on 08/04/2021, hemoglobin 9.0 associated with MCV 107 as well as normochromic findings, platelet count 265.  INR 1.1.  DRE revealed fecal occult blood positive finding.  COVID-19/influenza PCR negative.  Imaging and additional notable ED work-up: EKG, in comparison to most recent prior from 08/02/2021 showed sinus rhythm, with PAC, heart rate 66, prolonged PR interval of 227, right bundle branch block, left anterior fascicular block, and, relative to most recent prior EKG showed nonspecific T wave inversion in V2, while demonstrating no evidence of ST changes, including no evidence of ST elevation.  CT angio abdomen/pelvis for GI bleed showed no evidence of active GI bleed, while demonstrating evidence of diverticulosis without evidence of diverticulitis, and incidentally noted small bilateral pleural effusions.   EDP discussed the patient's case with on-call [facility], Dr. Tarri Glenn, who will formally consult.   While in [place] ED, the following were administered: Protonix 80 mg IV x1, normal saline x500 cc bolus.  Socially, the patient was transferred for admission to med telemetry unit at [facility] for further evaluation management of suspected acute upper gastrointestinal bleed with associated acute on chronic anemia.      Review of Systems: As per HPI otherwise 10 point review of systems negative.   Past Medical History:  Diagnosis Date    Adenomatous polyps    Bursitis    left hip   Chronic kidney disease    Stage 4   Frequent urination at night    GERD (gastroesophageal reflux disease)    Hypertension    Hypothyroidism    Pneumonia    Polyclonal gammopathy determined by serum protein electrophoresis 10/04/2015    Past Surgical History:  Procedure Laterality Date   AORTIC ARCH ANGIOGRAPHY N/A 08/01/2017   Procedure: Aortic Arch Angiography;  Surgeon: Conrad Collier, MD;  Location: [facility];  Service: Cardiovascular;  Laterality: N/A;   AV FISTULA PLACEMENT Left 07/10/2016   Procedure: LEFT BRACHIOCEPHALIC ARTERIOVENOUS (AV) FISTULA CREATION;  Surgeon: Conrad Parcelas Nuevas, MD;  Location: [facility];  Service: Vascular;  Laterality: Left;   COLONOSCOPY     RENAL BIOPSY Left 10/06/2015   REVISON OF ARTERIOVENOUS FISTULA Left 00/76/2263   Procedure: PLICATION OF LEFT BRACHIOCEPHALIC ARTERIOVENOUS FISTULA;  Surgeon: Conrad Gray, MD;  Location: [facility];  Service: Vascular;  Laterality: Left;   SKIN SURGERY     back   UPPER EXTREMITY ANGIOGRAPHY Left 08/01/2017   Procedure: Upper Extremity Angiography;  Surgeon: Conrad Weimar, MD;  Location: [facility];  Service: Cardiovascular;  Laterality: Left;    Social History:  reports that he quit smoking about 41 years ago. His smoking use included cigarettes. He has never used smokeless tobacco. He reports that he does not drink alcohol and does not use drugs.   No Known Allergies  Family History  Problem Relation Age of Onset   Diabetes Father    Diabetes Brother  Heart disease Brother    Colon cancer Neg Hx    Rectal cancer Neg Hx     Family history reviewed and not pertinent    Prior to Admission medications   Medication Sig Start Date End Date Taking? Authorizing Provider  calcium acetate (PHOSLO) 667 MG capsule Take 1 capsule by mouth 3 (three) times daily with meals.  04/29/17  Yes [provider]  Cyanocobalamin (VITAMIN B-12 PO) Take 1 tablet by  mouth daily.   Yes [provider]  diphenhydramine-acetaminophen (TYLENOL PM) 25-500 MG TABS tablet Take 1 tablet by mouth at bedtime as needed (sleep).   Yes [provider]  donepezil (ARICEPT) 10 MG tablet Take 10 mg by mouth every evening. 11/09/21  Yes [provider]  escitalopram (LEXAPRO) 10 MG tablet Take 10 mg by mouth at bedtime.  06/14/16  Yes [provider]  levothyroxine (SYNTHROID) 112 MCG tablet Take 112 mcg by mouth every morning. 10/17/21  Yes [provider]  loratadine (CLARITIN) 10 MG tablet Take 10 mg by mouth daily.   Yes [provider]  mirtazapine (REMERON) 15 MG tablet Take 15 mg by mouth at bedtime. 09/27/21  Yes [provider]  multivitamin (RENA-VIT) TABS tablet Take 1 tablet by mouth daily. 10/18/21  Yes [provider]  predniSONE (DELTASONE) 20 MG tablet Take 20 mg by mouth daily. 12/20/21  Yes [provider]  RENVELA 800 MG tablet Take 800 mg by mouth 3 (three) times daily. 12/20/21  Yes [provider]  traMADol (ULTRAM) 50 MG tablet Take 50 mg by mouth every 12 (twelve) hours as needed.   Yes [provider]  acetaminophen (TYLENOL) 325 MG tablet Take 2 tablets (650 mg total) by mouth every 6 (six) hours as needed for mild pain. 01/26/21   Hans Eden, NP  aspirin 81 MG chewable tablet Chew 1 tablet (81 mg total) by mouth daily. 08/06/21   Thurnell Lose, MD  diclofenac Sodium (VOLTAREN) 1 % GEL Apply 4 g topically 4 (four) times daily. 04/10/21   Chase Picket, MD  isosorbide mononitrate (IMDUR) 30 MG 24 hr tablet Take 0.5 tablets (15 mg total) by mouth daily. Patient not taking: Reported on 12/28/2021 08/06/21   Thurnell Lose, MD  levothyroxine (SYNTHROID) 100 MCG tablet Take 1 tablet (100 mcg total) by mouth daily at 6 (six) AM. Patient not taking: Reported on 12/28/2021 08/06/21   Thurnell Lose, MD  lidocaine-prilocaine (EMLA) cream Apply 1 application  topically daily as needed (port access).  Patient not taking: Reported on 12/28/2021 02/06/17   [provider]  pantoprazole (PROTONIX) 20 MG tablet Take 1 tablet (20 mg total) by mouth daily. 04/10/21   Lamptey, Myrene Galas, MD  polyethylene glycol [facility]) packet Take 17 g by mouth daily. 12/30/18   Robyn Haber, MD  polyvinyl alcohol (LIQUIFILM TEARS) 1.4 % ophthalmic solution Place 1-2 drops into both eyes as needed for dry eyes.    [provider]     Objective    Physical Exam: Vitals:   12/28/21 1400 12/28/21 1630 12/28/21 1645 12/28/21 1700  BP: (!) 103/49 (!) 110/53 (!) 110/50 (!) 108/54  Pulse: 67 65 66 65  Resp: '16 18 20 17  ' Temp:      SpO2: 99% 100% 100% 99%  Weight:      Height:        General: appears to be stated age; alert, oriented Skin: warm, dry, no rash Head:  AT/[place]  Mouth:  Oral mucosa membranes appear moist, normal dentition Neck: supple; trachea midline Heart:  RRR; did not appreciate any M/R/G Lungs: Slightly diminished bibasilar breath sounds, but otherwise CTAB, did not appreciate any wheezes, rales, or rhonchi Abdomen: + BS; soft, ND, NT Vascular: 2+ pedal pulses b/l; 2+ radial pulses b/l Extremities: no peripheral edema, no muscle wasting Neuro: strength and sensation intact in upper and lower extremities b/l    Labs on Admission: I have personally reviewed following labs and imaging studies  CBC: Recent Labs  Lab 12/28/21 1041 12/28/21 2002  WBC 11.2*  --   HGB 9.0* 8.2*  HCT 28.9* 25.4*  MCV 105.1*  --   PLT 265  --    Basic Metabolic Panel: Recent Labs  Lab 12/28/21 1041  NA 142  K 5.5*  CL 98  CO2 28  GLUCOSE 96  BUN 123*  CREATININE 5.84*  CALCIUM 9.3   GFR: Estimated Creatinine Clearance: 7.1 mL/min (A) (by C-G formula based on SCr of 5.84 mg/dL (H)). Liver Function Tests: Recent Labs  Lab 12/28/21 1041  AST 24  ALT 13  ALKPHOS 133*  BILITOT 0.5  PROT 7.2  ALBUMIN 3.5   No results for input(s):  LIPASE, AMYLASE in the last 168 hours. No results for input(s): AMMONIA in the last 168 hours. Coagulation Profile: Recent Labs  Lab 12/28/21 1041  INR 1.1   Cardiac Enzymes: No results for input(s): CKTOTAL, CKMB, CKMBINDEX, TROPONINI in the last 168 hours. BNP (last 3 results) No results for input(s): PROBNP in the last 8760 hours. HbA1C: No results for input(s): HGBA1C in the last 72 hours. CBG: No results for input(s): GLUCAP in the last 168 hours. Lipid Profile: No results for input(s): CHOL, HDL, LDLCALC, TRIG, CHOLHDL, LDLDIRECT in the last 72 hours. Thyroid Function Tests: No results for input(s): TSH, T4TOTAL, FREET4, T3FREE, THYROIDAB in the last 72 hours. Anemia Panel: Recent Labs    12/28/21 1041  RETICCTPCT 1.8   Urine analysis:    Component Value Date/Time   COLORURINE YELLOW 03/19/2016 1827   APPEARANCEUR CLEAR 03/19/2016 1827   LABSPEC 1.020 11/02/2020 1826   PHURINE 8.5 (H) 11/02/2020 1826   GLUCOSEU NEGATIVE 11/02/2020 1826   HGBUR TRACE (A) 11/02/2020 1826   BILIRUBINUR NEGATIVE 11/02/2020 1826   KETONESUR NEGATIVE 11/02/2020 1826   PROTEINUR >=300 (A) 11/02/2020 1826   UROBILINOGEN 0.2 11/02/2020 1826   NITRITE NEGATIVE 11/02/2020 1826   LEUKOCYTESUR NEGATIVE 11/02/2020 1826    Radiological Exams on Admission: CT ANGIO GI BLEED  Result Date: 12/28/2021 CLINICAL DATA:  Evaluate for mesenteric ischemia and diverticular bleeding. EXAM: CTA ABDOMEN AND PELVIS WITHOUT AND WITH CONTRAST TECHNIQUE: Multidetector CT imaging of the abdomen and pelvis was performed using the standard protocol during bolus administration of intravenous contrast. Multiplanar reconstructed images and MIPs were obtained and reviewed to evaluate the vascular anatomy. RADIATION DOSE REDUCTION: This exam was performed according to the departmental dose-optimization program which includes automated exposure control, adjustment of the mA and/or kV according to patient size and/or use of  iterative reconstruction technique. CONTRAST:  6m OMNIPAQUE IOHEXOL 350 MG/ML SOLN COMPARISON:  None. FINDINGS: VASCULAR Aorta: Diffuse atherosclerotic calcifications in the abdominal aorta without aneurysm, dissection or significant stenosis. Aorta at the level celiac trunk measures 2.8 cm. Celiac: Mild-to-moderate stenosis at the origin of the celiac trunk due to calcified plaque. There is ectasia or poststenotic dilatation of the celiac trunk measuring up to 1.1 cm. Splenic artery is heavily calcified. Common hepatic artery is  patent. SMA: Calcified plaque at the origin of the SMA without significant stenosis. Areas of mild narrowing in the main SMA. Diffuse atherosclerotic disease in the SMA branches. No evidence for acute thrombus. Renals: Atherosclerotic disease involving bilateral renal arteries. Prominent noncalcified plaque in the mid left renal artery could be causing moderate to severe stenosis in this area. Early branching of the right renal artery with areas of stenosis and atherosclerotic disease beyond the branch point. No evidence for aneurysm or dissections involving the renal arteries. IMA: Patent Inflow: Common, internal and external iliac arteries are patent bilaterally with diffuse atherosclerotic calcifications. Mild stenosis near the origin of the left internal iliac artery. Proximal Outflow: Proximal femoral arteries are patent bilaterally. Veins: Normal appearance the IVC and iliac veins. Main portal venous system is patent. Review of the MIP images confirms the above findings. NON-VASCULAR Lower chest: Bilateral pleural effusions. Pleural effusions are small in size. Compressive atelectasis or consolidation in the right lower lobe. Pleural-based densities in the right middle lobe are nonspecific. 4 mm nodule in the left lower lobe on sequence 17 image 17 is probably calcified and suggestive for a calcified granuloma. Suspect underlying emphysema. Enhancement or mild pleural thickening  associated with these pleural effusions. Hepatobiliary: Normal appearance of the liver. Mild gallbladder distension without inflammatory changes. Pancreas: Round low-density structure at the pancreatic head on sequence 7 image 72 measures 1.2 cm. This is suggestive for a small cystic lesion. No evidence for pancreatic duct dilatation or pancreatic inflammation. Spleen: Normal in size without focal abnormality. Adrenals/Urinary Tract: Adrenal glands are within the normal limits. Both kidneys are very atrophic. Multiple bilateral renal cysts. Negative for left hydronephrosis. There is fullness of the right renal pelvis without significant right caliceal dilatation. Ureters are not dilated. Bladder is decompressed but there is a diffuse bladder wall thickening which could be associated with under distension. Stomach/Bowel: Diverticula in the sigmoid colon without acute inflammation. No evidence for active GI bleeding. Normal appearance of the stomach. No bowel distention or obstruction. Lymphatic: No lymph node enlargement in the abdomen or pelvis. Reproductive: Prostate is unremarkable. Other: Negative for ascites or free air. Mild mesenteric stranding is nonspecific. Ventral hernia near the midline containing fat. Mild subcutaneous edema. Musculoskeletal: Multilevel disc space narrowing in lumbar spine. Mild anterolisthesis of L4 on L5. IMPRESSION: VASCULAR 1. No evidence for active GI bleeding. 2. Diffuse atherosclerotic disease in the abdomen and pelvis. Stenosis at the origin of the celiac trunk and bilateral renal arteries. 3.  Aortic Atherosclerosis (ICD10-I70.0). NON-VASCULAR 1. Colonic diverticulosis without acute bowel inflammation. 2. Bilateral pleural effusions with mild pleural thickening bilaterally. Extensive volume loss in the right lower lobe of uncertain etiology or chronicity. In addition, there is irregular pleural-based thickening along the right middle lobe. These pleural effusions are small in  size. There may be underlying emphysema. These chest findings could be better evaluated with a dedicated chest CT. 3. Bilateral renal atrophy with bilateral renal cysts. Dilatation of the right renal pelvis and suspect this is a chronic finding. 4. 1.2 cm low-density structure near the pancreatic head. This is suggestive for a small pancreatic cystic lesion. Consider follow-up imaging in 2 years. Electronically Signed   By: Markus Daft M.D.   On: 12/28/2021 13:41     EKG: Independently reviewed, with result as described above.    Assessment/Plan   Principal Problem:   GI bleed Active Problems:   Depression   ESRD on dialysis [facility])   Hypothyroidism   Acute on chronic anemia  Hyperkalemia   Leukocytosis      #) Acute Upper GI Bleed: Suspected diagnosis on the basis of 1 day of melena, with decreasing frequency over that timeframe, and with DRE revealing Hemoccult positive stool. Also a/w evidence of acute blood loss anemia, with presenting Hgb noted to be 9.0 relative to baseline hemoglobin range of 10-11.  In the setting of report of maroon appearing stool, source of GI bleed is suspected to be upper in nature, although evaluation such as complicated by difficulty with interpretation of BUN in the setting of history of end-stage renal disease on hemodialysis.  It is possible that patient's acute GI bleed could also be lower in distribution, albeit slow, including the possibility of diverticular bleed given and CTA abdomen/pelvis showing evidence of diverticulosis in the absence of diverticulitis, also demonstrating evidence of active GI bleed.   On daily baby aspirin as outpatient.  Denies NSAID use. No known history of known underlying liver disease, and denies any history of alcohol abuse or recent alcohol consumption. Has history of ESRD, and acknowledges good compliance with outpatient Protonix.  Most recent endoscopic evaluation appears to have occurred in 2013 via [place], as further  detailed above.  In the absence of known liver disease, initiation of SBP prophylaxis does not appear to be warranted. Presentation and history are less suggestive of variceal bleed, and therefore there does not appear to be an indication for octreotide. Given suspected upper GI source, will continue IV Protonix. At this time ddx broadly includes, but is not limited to gastritis vs erosive esophagitis vs PUD (gastric versus duodenal distribution) versus congestive gastropathy in the setting of known biventricular heart failure vs Dieulafoy lesion vs AVM.  Additional staining of the patient was recently on prednisone, also increasing his risk for GI bleed.   At this time, the patient appears hemodynamically stable, with normotensive blood pressures in the absence of any associated tachycardia, will acknowledging that the patient is not on any AV nodal blocking agents at home. Asymptomatic, last episode of melena occurred earlier today.  EDP consulted [facility] on-call gastroenterologist, Dr. Tarri Glenn, who will formally consult, as further detailed above.  In general, will closely monitor serial H&H trend overnight, while clinically monitoring including that of trend vital signs, while awaiting additional recommendations from formal GI consult, as above.      Plan: NPO. Refraining from pharmacologic DVT prophylaxis. Monitor on telemetry. Monitor continuous pulse-ox. Maintain at least 2 large bore IV's. Check INR in the a.m. Q4H H&H's have been ordered through 9 AM on 12/29/2021. Will closely monitor these ensuing Hgb levels and correlate these data points with the patient's overall clinical picture including vital signs to determine need for transfusion.  Type and screen ordered.  On-call GI consulted, as above, with additional recs pending at this time. Further evaluation and management of corresponding acute blood loss anemia, including adding-on iron studies to pre-transfusion specimen, as further detailed  below. CMP in the AM. Protonix 40 mg IV twice daily.  Hold home aspirin.       #) Acute on chronic anemia: in the setting of known chronic anemia with baseline hemoglobin of 10-11 likely with contribution from anemia of chronic kidney disease in setting of end-stage renal disease in addition to potential element of B12 deficiency given macrocytic findings on chronic oral B12 therapy as an outpatient, there is suspected element of acute exacerbation from acute blood loss in the context of acute gastrointestinal bleed, as further detailed above, with presenting hemoglobin noted  9.0.  At this time, patient appears hemodynamically stable and asymptomatic, as further described above.  Will trend every 4 hours H&H's overnight, while acknowledging the next may be slightly lower purely on the basis of hemodilution all influences given interval administration of IV fluid bolus administered at [facility] ED prior to transfer.   Plan: work-up and management for presenting suspected acute upper GI bleed, as above, including close monitoring of Q4H H&H's, with clinical evaluation for determination of need for blood transfusion, as further described above. Monitor on telemetry. Monitor continuous pulse-ox. NPO. Refraining from pharmacologic DVT prophylaxis.  Repeat INR in the morning.  Add on the following to initial lab specimen collected in the ED today: total iron, TIBC, ferritin, MMA, folic acid level, reticulocyte count. Gastroenterology consulted, as above.  Type and screen ordered.  Holding home aspirin, as above.      #) hyperkalemia: Presenting with potassium of 5.5 without any associated EKG changes.  This is in the context of a known history of end-stage renal disease on hemodialysis, with patient due for next scheduled routine session tomorrow, denying any recently missed scheduled HD sessions.  No evidence of concomitant anion gap metabolic acidosis, acute volume overload, or clinical evidence to  suggest uremia.  Consequently, there does not appear to be any indications for urgent/emergent overnight hemodialysis.  We will repeat BMP at this time to monitor interval trend, as well as response to a 500 cc normal saline bolus administered prior to transfer at [facility] ED, but otherwise we will plan for routine scheduled hemodialysis tomorrow unless significant will increase in serum potassium level to warrant additional interval medical management thereof.  Plan: Monitor strict I's and O's Daily weights.  On oximetry.  Repeat BMP.  CMP ordered for the morning.  Add on serum magnesium level.       #) Leukocytosis: Mildly elevated presenting with blood cell count of 11,200.  Potentially inflammatory in the setting of suspected presenting acute gastrointestinal bleed, as above versus pharmacologic consequence of recent prednisone course, as above.  No clinical evidence of suggest underlying infectious process, including COVID-19/influenza PCR negative.  Of note, patient anuric at baseline.  No acute respiratory symptoms to warrant further imaging of the chest at this time.  Aside from elevated white blood cell count, no additional SIRS criteria met at this time.  Overall, criteria for sepsis not met at this time.  Plan: Repeat CBC with differential in the morning.  Further evaluation and management of presenting acute gastrointestinal bleed, as above.  Monitor strict I's and O's.        #) ESRD: on HD (schedule:  M,W,F). Next due for routine HD on Friday, 12/28/21. No clinical evidence for urgent overnight HD or to expedite HD relative to this timeframe. Of note, will repeat BMP at this time to monitor mildly elevated initial serum potassium level, as further detailed above.   Plan: monitor strict I's/O's, daily weights. CMP in the AM. Check mag and phos levels. Anticipate consultation of nephrology in the AM to arrange for routine scheduled hemodialysis.        #) acquired  hypothyroidism: documented h/o such, on Synthroid as outpatient.   Plan: Holding home Synthroid in the setting of current n.p.o. status.       #) Chronic combined systolic and diastolic heart failure: documented history of such, including chronic biventricular failure with most recent echocardiogram performed in September 2022 notable for LVEF 93%, grade 2 diastolic dysfunction, and severely reduced right ventricular systolic function,  with the latter likely as a consequence of prolonged left-sided heart failure.  No clinical evidence to suggest acutely decompensated heart failure at this time.  An uric at baseline in setting of end-stage renal disease on hemodialysis.   Plan: monitor strict I's & O's and daily weights. Repeat BMP in the morning. Check serum magnesium level.       #) Depression: On Lexapro as an outpatient.  Plan: In the setting of current n.p.o. status, hold home Lexapro for now.       #) Allergic rhinitis: Documented history of such, on loratadine at home.  Plan: Hold home antihistamine for now in the setting of current n.p.o. status.       #) Chronic hypoxic respiratory failure: Documented history of such, on continuous 2 to 2.5 L continuous nasal cannula.  Per chart review, I have not encountered a specific etiology identified as a source of his chronic hypoxic respiratory failure.  However, in the setting of being a former smoker, as well as evidence of distal emphysematous changes on presenting CT abdomen/pelvis, suspect an element of COPD contributing to this.  Maintaining oxygen saturations in the high 90s on baseline supplemental oxygen at this time.   Plan: Monitor continuous pulse oximetry.       DVT prophylaxis: SCD's   Code Status: DNR (per dicussion w/ patient this evening). Family Communication: none Disposition Plan: Per Rounding Team Consults called: on-call [facility], Dr Tarri Glenn, consulted, as further detailed above;  Admission  status: inpt; med-tele.    PLEASE NOTE THAT DRAGON DICTATION SOFTWARE WAS USED IN THE CONSTRUCTION OF THIS NOTE.   Stanley DO [facility]  From [place]   12/28/2021, 8:59 PM

## 2021-12-29 DIAGNOSIS — K921 Melena: Secondary | ICD-10-CM

## 2021-12-29 LAB — COMPREHENSIVE METABOLIC PANEL
ALT: 15 U/L (ref 0–44)
AST: 21 U/L (ref 15–41)
Albumin: 2.8 g/dL — ABNORMAL LOW (ref 3.5–5.0)
Alkaline Phosphatase: 119 U/L (ref 38–126)
Anion gap: 13 (ref 5–15)
BUN: 135 mg/dL — ABNORMAL HIGH (ref 8–23)
CO2: 27 mmol/L (ref 22–32)
Calcium: 8.8 mg/dL — ABNORMAL LOW (ref 8.9–10.3)
Chloride: 100 mmol/L (ref 98–111)
Creatinine, Ser: 6.45 mg/dL — ABNORMAL HIGH (ref 0.61–1.24)
GFR, Estimated: 7 mL/min — ABNORMAL LOW (ref >60)
Glucose, Bld: 87 mg/dL (ref 70–99)
Potassium: 6.5 mmol/L (ref 3.5–5.1)
Sodium: 140 mmol/L (ref 135–145)
Total Bilirubin: 0.8 mg/dL (ref 0.3–1.2)
Total Protein: 6.4 g/dL — ABNORMAL LOW (ref 6.5–8.1)

## 2021-12-29 LAB — HEPATITIS B SURFACE ANTIBODY,QUALITATIVE: Hep B S Ab: REACTIVE — AB

## 2021-12-29 LAB — CBC
HCT: 24 % — ABNORMAL LOW (ref 39.0–52.0)
Hemoglobin: 7.5 g/dL — ABNORMAL LOW (ref 13.0–17.0)
MCH: 32.6 pg (ref 26.0–34.0)
MCHC: 31.3 g/dL (ref 30.0–36.0)
MCV: 104.3 fL — ABNORMAL HIGH (ref 80.0–100.0)
Platelets: 235 10*3/uL (ref 150–400)
RBC: 2.3 MIL/uL — ABNORMAL LOW (ref 4.22–5.81)
RDW: 15.8 % — ABNORMAL HIGH (ref 11.5–15.5)
WBC: 9.2 10*3/uL (ref 4.0–10.5)
nRBC: 0 % (ref 0.0–0.2)

## 2021-12-29 LAB — PHOSPHORUS: Phosphorus: 5.5 mg/dL — ABNORMAL HIGH (ref 2.5–4.6)

## 2021-12-29 LAB — POTASSIUM
Potassium: 6.2 mmol/L — ABNORMAL HIGH (ref 3.5–5.1)
Potassium: 6 mmol/L — ABNORMAL HIGH (ref 3.5–5.1)
Potassium: 3.9 mmol/L (ref 3.5–5.1)
Potassium: 4.1 mmol/L (ref 3.5–5.1)
Potassium: 4.1 mmol/L (ref 3.5–5.1)

## 2021-12-29 LAB — HEPATITIS B SURFACE ANTIGEN: Hepatitis B Surface Ag: NONREACTIVE

## 2021-12-29 LAB — HEMOGLOBIN AND HEMATOCRIT, BLOOD
HCT: 29 % — ABNORMAL LOW (ref 39.0–52.0)
Hemoglobin: 9.4 g/dL — ABNORMAL LOW (ref 13.0–17.0)

## 2021-12-29 LAB — PREPARE RBC (CROSSMATCH)

## 2021-12-29 LAB — ABO/RH: ABO/RH(D): AB POS

## 2021-12-29 LAB — PROTIME-INR
INR: 1.2 (ref 0.8–1.2)
Prothrombin Time: 15 seconds (ref 11.4–15.2)

## 2021-12-29 LAB — MAGNESIUM: Magnesium: 2.7 mg/dL — ABNORMAL HIGH (ref 1.7–2.4)

## 2021-12-29 MED ORDER — HEPARIN SODIUM (PORCINE) 1000 UNIT/ML DIALYSIS: 1000.0000 [IU] | INTRAMUSCULAR | Status: DC | PRN

## 2021-12-29 MED ORDER — LIDOCAINE-PRILOCAINE 2.5-2.5 % EX CREA: 1.0000 "application " | TOPICAL_CREAM | CUTANEOUS | Status: DC | PRN

## 2021-12-29 MED ORDER — CHLORHEXIDINE GLUCONATE CLOTH 2 % EX PADS
6.0000 | MEDICATED_PAD | Freq: Every day | CUTANEOUS | Status: DC
  Administered 2021-12-29 – 2022-01-01 (×4): 6 via TOPICAL

## 2021-12-29 MED ORDER — SODIUM CHLORIDE 0.9 % IV SOLN
125.0000 mg | INTRAVENOUS | Status: DC
  Administered 2021-12-29 – 2022-01-01 (×3): 125 mg via INTRAVENOUS
  Filled 2021-12-29 (×2): qty 10

## 2021-12-29 MED ORDER — SODIUM CHLORIDE 0.9 % IV SOLN: 100.0000 mL | INTRAVENOUS | Status: DC | PRN

## 2021-12-29 MED ORDER — SODIUM CHLORIDE 0.9% IV SOLUTION: Freq: Once | INTRAVENOUS | Status: AC

## 2021-12-29 MED ORDER — SODIUM ZIRCONIUM CYCLOSILICATE 10 G PO PACK
10.0000 g | PACK | Freq: Once | ORAL | Status: AC
  Administered 2021-12-29: 10 g via ORAL
  Filled 2021-12-29: qty 1

## 2021-12-29 MED ORDER — LIDOCAINE HCL (PF) 1 % IJ SOLN: 5.0000 mL | INTRAMUSCULAR | Status: DC | PRN

## 2021-12-29 MED ORDER — PENTAFLUOROPROP-TETRAFLUOROETH EX AERO: 1.0000 "application " | INHALATION_SPRAY | CUTANEOUS | Status: DC | PRN

## 2021-12-29 NOTE — Progress Notes (Signed)
New Pt arrived from [facility] via stretcher. Pt was oriented to his room, call bell placed within reach, bed placed at lowest position with bed alarm on. Admitting MD was contacted via secure message.

## 2021-12-29 NOTE — Procedures (Signed)
° °  I was present at this dialysis session, have reviewed the session itself and made  appropriate changes Kelly Splinter MD [place] pager [phone]   12/29/2021, 4:05 PM

## 2021-12-29 NOTE — Consult Note (Signed)
[facility] Renal Consultation Note    Indication for Consultation:  Management of ESRD/hemodialysis; anemia, hypertension/volume and secondary hyperparathyroidism PCP:  HPI: Lonnie [name] is a 88 y.o. male with ESRD on hemodialysis MWF at [facility]. PMH: HFrEF EF 20%, AFIB, G2DD, Polyclonal gammopathy, COPD, hypothyroidism, SHPT, Anemia of ESRD.Patient is extremely compliant with HD.   Patient presented to ED with C/O maroon stools since 12/27/2021. He presented to  [facility] ED 12/28/2021 with C/O of dark red blood in stools. Last OP HGB 11 12/20/2021. HGB 9.0 on admission, down to 7.5 this AM. K+ 5.5 SCr 5.84 CO2 28 Ca 9.3 Alb 3.5 PO4 2.7. Iron panel unremarkable. CT of abd/pelvis Colonic diverticulosis without acute bowel inflammation. Bilateral pleural effusions present.  He has been admitted for work up for GIB. We will manage HD.   Seen on HD, K+ 6.5 this AM. He has no complaints. He is unable to tell me if he has had more bloody stools this AM, says he doesn't know. Denies abdominal pain, N, V, F, C, chest pain or SOB.   Past Medical History:  Diagnosis Date   Adenomatous polyps    Bursitis    left hip   Chronic kidney disease    Stage 4   Frequent urination at night    GERD (gastroesophageal reflux disease)    Hypertension    Hypothyroidism    Pneumonia    Polyclonal gammopathy determined by serum protein electrophoresis 10/04/2015   Past Surgical History:  Procedure Laterality Date   AORTIC ARCH ANGIOGRAPHY N/A 08/01/2017   Procedure: Aortic Arch Angiography;  Surgeon: Conrad Singac, MD;  Location: [facility];  Service: Cardiovascular;  Laterality: N/A;   AV FISTULA PLACEMENT Left 07/10/2016   Procedure: LEFT BRACHIOCEPHALIC ARTERIOVENOUS (AV) FISTULA CREATION;  Surgeon: Conrad Mamers, MD;  Location: [facility];  Service: Vascular;  Laterality: Left;   COLONOSCOPY     RENAL BIOPSY Left 10/06/2015   REVISON OF ARTERIOVENOUS FISTULA Left  73/71/0626   Procedure: PLICATION OF LEFT BRACHIOCEPHALIC ARTERIOVENOUS FISTULA;  Surgeon: Conrad Tesuque, MD;  Location: [facility];  Service: Vascular;  Laterality: Left;   SKIN SURGERY     back   UPPER EXTREMITY ANGIOGRAPHY Left 08/01/2017   Procedure: Upper Extremity Angiography;  Surgeon: Conrad Madison Lake, MD;  Location: [facility];  Service: Cardiovascular;  Laterality: Left;   Family History  Problem Relation Age of Onset   Diabetes Father    Diabetes Brother    Heart disease Brother    Colon cancer Neg Hx    Rectal cancer Neg Hx    Social History:  reports that he quit smoking about 41 years ago. His smoking use included cigarettes. He has never used smokeless tobacco. He reports that he does not drink alcohol and does not use drugs. No Known Allergies Prior to Admission medications   Medication Sig Start Date End Date Taking? Authorizing Provider  calcium acetate (PHOSLO) 667 MG capsule Take 1 capsule by mouth 3 (three) times daily with meals.  04/29/17  Yes [provider]  Cyanocobalamin (VITAMIN B-12 PO) Take 1 tablet by mouth daily.   Yes [provider]  diphenhydramine-acetaminophen (TYLENOL PM) 25-500 MG TABS tablet Take 1 tablet by mouth at bedtime as needed (sleep).   Yes [provider]  donepezil (ARICEPT) 10 MG tablet Take 10 mg by mouth every evening. 11/09/21  Yes [provider]  escitalopram (LEXAPRO) 10 MG tablet Take 10  mg by mouth at bedtime.  06/14/16  Yes [provider]  levothyroxine (SYNTHROID) 112 MCG tablet Take 112 mcg by mouth every morning. 10/17/21  Yes [provider]  loratadine (CLARITIN) 10 MG tablet Take 10 mg by mouth daily.   Yes [provider]  mirtazapine (REMERON) 15 MG tablet Take 15 mg by mouth at bedtime. 09/27/21  Yes [provider]  multivitamin (RENA-VIT) TABS tablet Take 1 tablet by mouth daily. 10/18/21  Yes [provider]  predniSONE (DELTASONE) 20 MG  tablet Take 20 mg by mouth daily. 12/20/21  Yes [provider]  RENVELA 800 MG tablet Take 800 mg by mouth 3 (three) times daily. 12/20/21  Yes [provider]  traMADol (ULTRAM) 50 MG tablet Take 50 mg by mouth every 12 (twelve) hours as needed.   Yes [provider]  acetaminophen (TYLENOL) 325 MG tablet Take 2 tablets (650 mg total) by mouth every 6 (six) hours as needed for mild pain. 01/26/21   Hans Eden, NP  aspirin 81 MG chewable tablet Chew 1 tablet (81 mg total) by mouth daily. 08/06/21   Thurnell Lose, MD  diclofenac Sodium (VOLTAREN) 1 % GEL Apply 4 g topically 4 (four) times daily. 04/10/21   Chase Picket, MD  isosorbide mononitrate (IMDUR) 30 MG 24 hr tablet Take 0.5 tablets (15 mg total) by mouth daily. Patient not taking: Reported on 12/28/2021 08/06/21   Thurnell Lose, MD  levothyroxine (SYNTHROID) 100 MCG tablet Take 1 tablet (100 mcg total) by mouth daily at 6 (six) AM. Patient not taking: Reported on 12/28/2021 08/06/21   Thurnell Lose, MD  lidocaine-prilocaine (EMLA) cream Apply 1 application topically daily as needed (port access).  Patient not taking: Reported on 12/28/2021 02/06/17   [provider]  pantoprazole (PROTONIX) 20 MG tablet Take 1 tablet (20 mg total) by mouth daily. 04/10/21   Lamptey, Myrene Galas, MD  polyethylene glycol [facility]) packet Take 17 g by mouth daily. 12/30/18   Robyn Haber, MD  polyvinyl alcohol (LIQUIFILM TEARS) 1.4 % ophthalmic solution Place 1-2 drops into both eyes as needed for dry eyes.    [provider]   Current Facility-Administered Medications  Medication Dose Route Frequency Provider Last Rate Last Admin   0.9 %  sodium chloride infusion  100 mL Intravenous PRN Reesa Chew, MD       0.9 %  sodium chloride infusion  100 mL Intravenous PRN Reesa Chew, MD       acetaminophen (TYLENOL) tablet 650 mg  650 mg Oral Q6H PRN Howerter, Justin B, DO       Or   acetaminophen  (TYLENOL) suppository 650 mg  650 mg Rectal Q6H PRN Howerter, Justin B, DO       Chlorhexidine Gluconate Cloth 2 % PADS 6 each  6 each Topical Q0600 Reesa Chew, MD       heparin injection 1,000 Units  1,000 Units Dialysis PRN Reesa Chew, MD       lidocaine (PF) (XYLOCAINE) 1 % injection 5 mL  5 mL Intradermal PRN Reesa Chew, MD       lidocaine-prilocaine (EMLA) cream 1 application  1 application Topical PRN Reesa Chew, MD       pantoprazole (PROTONIX) injection 40 mg  40 mg Intravenous Q12H Wynetta Fines T, MD   80 mg at 12/28/21 1226   pentafluoroprop-tetrafluoroeth (GEBAUERS) aerosol 1 application  1 application Topical PRN Santiago Bumpers  J, MD       Labs: Basic Metabolic Panel: Recent Labs  Lab 12/28/21 1041 12/28/21 2019 12/29/21 0050 12/29/21 0317  NA 142  --  140  --   K 5.5* 6.2* 6.5* 6.0*  CL 98  --  100  --   CO2 28  --  27  --   GLUCOSE 96  --  87  --   BUN 123*  --  135*  --   CREATININE 5.84*  --  6.45*  --   CALCIUM 9.3  --  8.8*  --   PHOS  --   --  5.5*  --    Liver Function Tests: Recent Labs  Lab 12/28/21 1041 12/29/21 0050  AST 24 21  ALT 13 15  ALKPHOS 133* 119  BILITOT 0.5 0.8  PROT 7.2 6.4*  ALBUMIN 3.5 2.8*   No results for input(s): LIPASE, AMYLASE in the last 168 hours. No results for input(s): AMMONIA in the last 168 hours. CBC: Recent Labs  Lab 12/28/21 1041 12/28/21 2002 12/29/21 0050  WBC 11.2*  --  9.2  HGB 9.0* 8.2* 7.5*  HCT 28.9* 25.4* 24.0*  MCV 105.1*  --  104.3*  PLT 265  --  235   Cardiac Enzymes: No results for input(s): CKTOTAL, CKMB, CKMBINDEX, TROPONINI in the last 168 hours. CBG: No results for input(s): GLUCAP in the last 168 hours. Iron Studies:  Recent Labs    12/28/21 2002  IRON 63  TIBC 206*  FERRITIN 1,083*   Studies/Results: CT ANGIO GI BLEED  Result Date: 12/28/2021 CLINICAL DATA:  Evaluate for mesenteric ischemia and diverticular bleeding. EXAM: CTA ABDOMEN AND PELVIS  WITHOUT AND WITH CONTRAST TECHNIQUE: Multidetector CT imaging of the abdomen and pelvis was performed using the standard protocol during bolus administration of intravenous contrast. Multiplanar reconstructed images and MIPs were obtained and reviewed to evaluate the vascular anatomy. RADIATION DOSE REDUCTION: This exam was performed according to the departmental dose-optimization program which includes automated exposure control, adjustment of the mA and/or kV according to patient size and/or use of iterative reconstruction technique. CONTRAST:  96mL OMNIPAQUE IOHEXOL 350 MG/ML SOLN COMPARISON:  None. FINDINGS: VASCULAR Aorta: Diffuse atherosclerotic calcifications in the abdominal aorta without aneurysm, dissection or significant stenosis. Aorta at the level celiac trunk measures 2.8 cm. Celiac: Mild-to-moderate stenosis at the origin of the celiac trunk due to calcified plaque. There is ectasia or poststenotic dilatation of the celiac trunk measuring up to 1.1 cm. Splenic artery is heavily calcified. Common hepatic artery is patent. SMA: Calcified plaque at the origin of the SMA without significant stenosis. Areas of mild narrowing in the main SMA. Diffuse atherosclerotic disease in the SMA branches. No evidence for acute thrombus. Renals: Atherosclerotic disease involving bilateral renal arteries. Prominent noncalcified plaque in the mid left renal artery could be causing moderate to severe stenosis in this area. Early branching of the right renal artery with areas of stenosis and atherosclerotic disease beyond the branch point. No evidence for aneurysm or dissections involving the renal arteries. IMA: Patent Inflow: Common, internal and external iliac arteries are patent bilaterally with diffuse atherosclerotic calcifications. Mild stenosis near the origin of the left internal iliac artery. Proximal Outflow: Proximal femoral arteries are patent bilaterally. Veins: Normal appearance the IVC and iliac veins. Main  portal venous system is patent. Review of the MIP images confirms the above findings. NON-VASCULAR Lower chest: Bilateral pleural effusions. Pleural effusions are small in size. Compressive atelectasis or consolidation in the right  lower lobe. Pleural-based densities in the right middle lobe are nonspecific. 4 mm nodule in the left lower lobe on sequence 17 image 17 is probably calcified and suggestive for a calcified granuloma. Suspect underlying emphysema. Enhancement or mild pleural thickening associated with these pleural effusions. Hepatobiliary: Normal appearance of the liver. Mild gallbladder distension without inflammatory changes. Pancreas: Round low-density structure at the pancreatic head on sequence 7 image 72 measures 1.2 cm. This is suggestive for a small cystic lesion. No evidence for pancreatic duct dilatation or pancreatic inflammation. Spleen: Normal in size without focal abnormality. Adrenals/Urinary Tract: Adrenal glands are within the normal limits. Both kidneys are very atrophic. Multiple bilateral renal cysts. Negative for left hydronephrosis. There is fullness of the right renal pelvis without significant right caliceal dilatation. Ureters are not dilated. Bladder is decompressed but there is a diffuse bladder wall thickening which could be associated with under distension. Stomach/Bowel: Diverticula in the sigmoid colon without acute inflammation. No evidence for active GI bleeding. Normal appearance of the stomach. No bowel distention or obstruction. Lymphatic: No lymph node enlargement in the abdomen or pelvis. Reproductive: Prostate is unremarkable. Other: Negative for ascites or free air. Mild mesenteric stranding is nonspecific. Ventral hernia near the midline containing fat. Mild subcutaneous edema. Musculoskeletal: Multilevel disc space narrowing in lumbar spine. Mild anterolisthesis of L4 on L5. IMPRESSION: VASCULAR 1. No evidence for active GI bleeding. 2. Diffuse atherosclerotic  disease in the abdomen and pelvis. Stenosis at the origin of the celiac trunk and bilateral renal arteries. 3.  Aortic Atherosclerosis (ICD10-I70.0). NON-VASCULAR 1. Colonic diverticulosis without acute bowel inflammation. 2. Bilateral pleural effusions with mild pleural thickening bilaterally. Extensive volume loss in the right lower lobe of uncertain etiology or chronicity. In addition, there is irregular pleural-based thickening along the right middle lobe. These pleural effusions are small in size. There may be underlying emphysema. These chest findings could be better evaluated with a dedicated chest CT. 3. Bilateral renal atrophy with bilateral renal cysts. Dilatation of the right renal pelvis and suspect this is a chronic finding. 4. 1.2 cm low-density structure near the pancreatic head. This is suggestive for a small pancreatic cystic lesion. Consider follow-up imaging in 2 years. Electronically Signed   By: Markus Daft M.D.   On: 12/28/2021 13:41    ROS: As per HPI otherwise negative.   Physical Exam: Vitals:   12/29/21 0510 12/29/21 0816 12/29/21 0841 12/29/21 0857  BP: (!) 107/42 (!) 101/38 (!) 117/56 (!) 112/46  Pulse: 65 74 73 75  Resp: 20 17 (!) 21   Temp: 98 F (36.7 C) 97.7 F (36.5 C) (!) 96.9 F (36.1 C)   TempSrc: Axillary Axillary Temporal   SpO2: 100% 99% 100%   Weight:   57.4 kg   Height:         General: Frail appearing very elderly male in NAD Head: Normocephalic, atraumatic, sclera non-icteric, mucus membranes are moist Neck: Supple. JVD not elevated. Lungs: Clear bilaterally to auscultation without wheezes, rales, or rhonchi. Breathing is unlabored. Heart: irregular  with S1 S2 3/6 systolic M. AFib on monitor. Rate controlled.  Abdomen: Soft, non-tender, non-distended with normoactive bowel sounds. No rebound/guarding. No obvious abdominal masses. Lower extremities:without edema or ischemic changes, no open wounds  Neuro: Alert and oriented X 3. Moves all  extremities spontaneously. Psych:  Responds to questions appropriately with a normal affect. Dialysis Access: L AVF Cannulated  Dialysis Orders: [facility] MWF 3.5 hrs 180NRe 400/800 2.0 K/2.0 Ca AVF -No heparin -  Mircera 30 mcg IV q 4 week (last dose 12/13/2021) -Venofer 100 mg IV X 5 doses (Not started yet) -Calcitril 0.25 mcg PO TIW   Assessment/Plan:  Acute GIB: HGB 7.5. Will transfuse 2 units of PRBCS today. GI Consulted. Per primary Hyperkalemia: K+ 6.5. Use 2.0 K bath. Follow daily labs.   ESRD - MWF. HD today on schedule.   Hypertension/volume  - Appears euvolemic by exam but developed hypotension early into HD. Will run even. Giving volume via transfusion. No antihypertensive meds on OP Med list.   Anemia  - See # 1. Will continue OP Fe load here. Follow HGB.   Metabolic bone disease - PO4 2.7 here. Decrease binder dose. Continue VDRA.   Nutrition - NPO at present. Renal diet with fld restrictions when eating HFrEF-no evidence of volume overload by exam but has bilateral pleural effusion. Will address lowering EDW is possible when he is more hemodynamically stable.  Afib-rate controlled, thankfully not on anticoagulation.  COPD-per primary  Jimmye Norman. Owens Shark, NP-C 12/29/2021, 9:18 AM  [employer] [phone]

## 2021-12-29 NOTE — Plan of Care (Signed)
  Problem: Education: Goal: Knowledge of General Education information will improve Description: Including pain rating scale, medication(s)/side effects and non-pharmacologic comfort measures Outcome: Progressing   Problem: Health Behavior/Discharge Planning: Goal: Ability to manage health-related needs will improve Outcome: Progressing   Problem: Clinical Measurements: Goal: Ability to maintain clinical measurements within normal limits will improve Outcome: Progressing Goal: Will remain free from infection Outcome: Progressing Goal: Respiratory complications will improve Outcome: Progressing Goal: Cardiovascular complication will be avoided Outcome: Progressing   Problem: Activity: Goal: Risk for activity intolerance will decrease Outcome: Progressing   Problem: Nutrition: Goal: Adequate nutrition will be maintained Outcome: Progressing   Problem: Coping: Goal: Level of anxiety will decrease Outcome: Progressing   Problem: Elimination: Goal: Will not experience complications related to bowel motility Outcome: Progressing   Problem: Pain Managment: Goal: General experience of comfort will improve Outcome: Progressing   Problem: Safety: Goal: Ability to remain free from injury will improve Outcome: Progressing   

## 2021-12-29 NOTE — Plan of Care (Signed)

## 2021-12-29 NOTE — Consult Note (Signed)
Referring Provider: Dr. Laverta Baltimore, EDP Primary Care Physician:  Prince Solian, MD Primary Gastroenterologist:  Dr. Henrene Pastor  Reason for Consultation:  GI bleed  HPI: Lonnie [name] is a 88 y.o. male with medical history significant for end-stage renal disease on hemodialysis on MWF, chronic anemia with baseline hemoglobin 10-11 grams, GERD, acquired hypothyroidism, chronic systolic/diastolic heart failure, chronic hypoxic respiratory failure on 2 to 2.5 L continuous nasal cannula who was admitted to [facility] on 12/28/2021 by way of transfer from [facility] emergency department with suspected acute upper gastrointestinal bleed after presenting from home to [facility] ED complaining of maroon-colored stool.   The patient could not recall much of the history of what occurred at the time of my visit so this is what I read in the chart:  "The patient reports 1 day of maroon-colored stool in the absence of any associated hematochezia.  He noted first episode of maroon-colored stools starting on 12/27/2021, with a total of 3 such episodes associated with well-formed stool over the course of 12/27/2021.  He conveys that the frequency of his maroon-colored stool is decreasing, noting only 1 episode of such over the course of 12/28/2021 thus far. No associated with abdominal pain, N/V, hematemesis."  No NSAIDs.  Apparently compliant with his outpatient pantoprazole for GERD.  Hgb 7.5 grams early this AM, was 9.0 grams on presentation.  Baseline 10-11 gram range.  He received 2 units of PRBCs.  CT angio of the abdomen and pelvis showed the following:  IMPRESSION: VASCULAR   1. No evidence for active GI bleeding. 2. Diffuse atherosclerotic disease in the abdomen and pelvis. Stenosis at the origin of the celiac trunk and bilateral renal arteries. 3.  Aortic Atherosclerosis (ICD10-I70.0).   NON-VASCULAR   1. Colonic diverticulosis without acute bowel inflammation. 2. Bilateral pleural effusions  with mild pleural thickening bilaterally. Extensive volume loss in the right lower lobe of uncertain etiology or chronicity. In addition, there is irregular pleural-based thickening along the right middle lobe. These pleural effusions are small in size. There may be underlying emphysema. These chest findings could be better evaluated with a dedicated chest CT. 3. Bilateral renal atrophy with bilateral renal cysts. Dilatation of the right renal pelvis and suspect this is a chronic finding. 4. 1.2 cm low-density structure near the pancreatic head. This is suggestive for a small pancreatic cystic lesion. Consider follow-up imaging in 2 years.  His nurse reports that he had a blood-tinged stool overnight but no BM so far today.  He just returned from dialysis.   Past Medical History:  Diagnosis Date   Adenomatous polyps    Bursitis    left hip   Chronic kidney disease    Stage 4   Frequent urination at night    GERD (gastroesophageal reflux disease)    Hypertension    Hypothyroidism    Pneumonia    Polyclonal gammopathy determined by serum protein electrophoresis 10/04/2015    Past Surgical History:  Procedure Laterality Date   AORTIC ARCH ANGIOGRAPHY N/A 08/01/2017   Procedure: Aortic Arch Angiography;  Surgeon: Conrad Riesel, MD;  Location: [place] [facility];  Service: Cardiovascular;  Laterality: N/A;   AV FISTULA PLACEMENT Left 07/10/2016   Procedure: LEFT BRACHIOCEPHALIC ARTERIOVENOUS (AV) FISTULA CREATION;  Surgeon: Conrad [place], MD;  Location: [place];  Service: Vascular;  Laterality: Left;   COLONOSCOPY     RENAL BIOPSY Left 10/06/2015   REVISON OF ARTERIOVENOUS FISTULA Left 17/51/0258   Procedure: PLICATION OF LEFT  BRACHIOCEPHALIC ARTERIOVENOUS FISTULA;  Surgeon: Conrad Durand, MD;  Location: [place];  Service: Vascular;  Laterality: Left;   SKIN SURGERY     back   UPPER EXTREMITY ANGIOGRAPHY Left 08/01/2017   Procedure: Upper Extremity Angiography;  Surgeon: Conrad [place],  MD;  Location: [place] [facility];  Service: Cardiovascular;  Laterality: Left;    Prior to Admission medications   Medication Sig Start Date End Date Taking? Authorizing Provider  calcium acetate (PHOSLO) 667 MG capsule Take 1 capsule by mouth 3 (three) times daily with meals.  04/29/17  Yes [provider]  Cyanocobalamin (VITAMIN B-12 PO) Take 1 tablet by mouth daily.   Yes [provider]  diphenhydramine-acetaminophen (TYLENOL PM) 25-500 MG TABS tablet Take 1 tablet by mouth at bedtime as needed (sleep).   Yes [provider]  donepezil (ARICEPT) 10 MG tablet Take 10 mg by mouth every evening. 11/09/21  Yes [provider]  escitalopram (LEXAPRO) 10 MG tablet Take 10 mg by mouth at bedtime.  06/14/16  Yes [provider]  levothyroxine (SYNTHROID) 112 MCG tablet Take 112 mcg by mouth every morning. 10/17/21  Yes [provider]  loratadine (CLARITIN) 10 MG tablet Take 10 mg by mouth daily.   Yes [provider]  mirtazapine (REMERON) 15 MG tablet Take 15 mg by mouth at bedtime. 09/27/21  Yes [provider]  multivitamin (RENA-VIT) TABS tablet Take 1 tablet by mouth daily. 10/18/21  Yes [provider]  predniSONE (DELTASONE) 20 MG tablet Take 20 mg by mouth daily. 12/20/21  Yes [provider]  RENVELA 800 MG tablet Take 800 mg by mouth 3 (three) times daily. 12/20/21  Yes [provider]  traMADol (ULTRAM) 50 MG tablet Take 50 mg by mouth every 12 (twelve) hours as needed.   Yes [provider]  acetaminophen (TYLENOL) 325 MG tablet Take 2 tablets (650 mg total) by mouth every 6 (six) hours as needed for mild pain. 01/26/21   Hans Eden, NP  aspirin 81 MG chewable tablet Chew 1 tablet (81 mg total) by mouth daily. 08/06/21   Thurnell Lose, MD  diclofenac Sodium (VOLTAREN) 1 % GEL Apply 4 g topically 4 (four) times daily. 04/10/21   Chase Picket, MD  isosorbide mononitrate  (IMDUR) 30 MG 24 hr tablet Take 0.5 tablets (15 mg total) by mouth daily. Patient not taking: Reported on 12/28/2021 08/06/21   Thurnell Lose, MD  levothyroxine (SYNTHROID) 100 MCG tablet Take 1 tablet (100 mcg total) by mouth daily at 6 (six) AM. Patient not taking: Reported on 12/28/2021 08/06/21   Thurnell Lose, MD  lidocaine-prilocaine (EMLA) cream Apply 1 application topically daily as needed (port access).  Patient not taking: Reported on 12/28/2021 02/06/17   [provider]  pantoprazole (PROTONIX) 20 MG tablet Take 1 tablet (20 mg total) by mouth daily. 04/10/21   Lamptey, Myrene Galas, MD  polyethylene glycol [facility]) packet Take 17 g by mouth daily. 12/30/18   Robyn Haber, MD  polyvinyl alcohol (LIQUIFILM TEARS) 1.4 % ophthalmic solution Place 1-2 drops into both eyes as needed for dry eyes.    [provider]    Current Facility-Administered Medications  Medication Dose Route Frequency Provider Last Rate Last Admin   0.9 %  sodium chloride infusion  100 mL Intravenous PRN Reesa Chew, MD       0.9 %  sodium chloride infusion  100 mL Intravenous PRN Joylene Grapes Shaune Pollack, MD  acetaminophen (TYLENOL) tablet 650 mg  650 mg Oral Q6H PRN Howerter, Justin B, DO       Or   acetaminophen (TYLENOL) suppository 650 mg  650 mg Rectal Q6H PRN Howerter, Justin B, DO       Chlorhexidine Gluconate Cloth 2 % PADS 6 each  6 each Topical Q0600 Reesa Chew, MD       heparin injection 1,000 Units  1,000 Units Dialysis PRN Reesa Chew, MD       lidocaine (PF) (XYLOCAINE) 1 % injection 5 mL  5 mL Intradermal PRN Reesa Chew, MD       lidocaine-prilocaine (EMLA) cream 1 application  1 application Topical PRN Reesa Chew, MD       pantoprazole (PROTONIX) injection 40 mg  40 mg Intravenous Q12H Wynetta Fines T, MD   80 mg at 12/28/21 1226   pentafluoroprop-tetrafluoroeth (GEBAUERS) aerosol 1 application  1 application Topical PRN Reesa Chew, MD         Allergies as of 12/28/2021   (No Known Allergies)    Family History  Problem Relation Age of Onset   Diabetes Father    Diabetes Brother    Heart disease Brother    Colon cancer Neg Hx    Rectal cancer Neg Hx     Social History   Socioeconomic History   Marital status: Widowed    Spouse name: Not on file   Number of children: Not on file   Years of education: Not on file   Highest education level: Not on file  Occupational History   Not on file  Tobacco Use   Smoking status: Former    Types: Cigarettes    Quit date: 01/25/1980    Years since quitting: 41.9   Smokeless tobacco: Never  Vaping Use   Vaping Use: Never used  Substance and Sexual Activity   Alcohol use: No    Comment: former alcoholic 32 years ago was last drink   Drug use: No   Sexual activity: Not on file  Other Topics Concern   Not on file  Social History Narrative   Not on file   Social Determinants of Health   Financial Resource Strain: Not on file  Food Insecurity: Not on file  Transportation Needs: Not on file  Physical Activity: Not on file  Stress: Not on file  Social Connections: Not on file  Intimate Partner Violence: Not on file    Review of Systems: ROS is O/W negative except as mentioned in HPI.  Physical Exam: Vital signs in last 24 hours: Temp:  [96.9 F (36.1 C)-98.2 F (36.8 C)] 96.9 F (36.1 C) (01/27 0841) Pulse Rate:  [65-75] 75 (01/27 0857) Resp:  [16-24] 21 (01/27 0841) BP: (101-123)/(38-73) 112/46 (01/27 0857) SpO2:  [96 %-100 %] 100 % (01/27 0841) Weight:  [57.4 kg-64.9 kg] 57.4 kg (01/27 0841) Last BM Date: 12/29/21 General:  Alert, frail, pleasant and cooperative in NAD Head:  Normocephalic and atraumatic. Eyes:  Sclera clear, no icterus.  Conjunctiva pink. Ears:  Normal auditory acuity. Mouth:  No deformity or lesions.   Lungs:  Clear throughout to auscultation.  No wheezes, crackles, or rhonchi.  Heart:  Regular rate and rhythm; no murmurs, clicks,  rubs, or gallops. Abdomen:  Soft, non-distended.  BS present.  Non-tender. Msk:  Symmetrical without gross deformities. Pulses:  Normal pulses noted. Extremities:  Without clubbing or edema. Neurologic:  Alert and oriented;  grossly normal neurologically. Skin:  Intact  without significant lesions or rashes. Psych:  Alert and cooperative. Normal mood and affect.  Intake/Output from previous day: 01/26 0701 - 01/27 0700 In: 600.3 [IV Piggyback:600.3] Out: -   Lab Results: Recent Labs    12/28/21 1041 12/28/21 2002 12/29/21 0050  WBC 11.2*  --  9.2  HGB 9.0* 8.2* 7.5*  HCT 28.9* 25.4* 24.0*  PLT 265  --  235   BMET Recent Labs    12/28/21 1041 12/28/21 2019 12/29/21 0050 12/29/21 0317  NA 142  --  140  --   K 5.5* 6.2* 6.5* 6.0*  CL 98  --  100  --   CO2 28  --  27  --   GLUCOSE 96  --  87  --   BUN 123*  --  135*  --   CREATININE 5.84*  --  6.45*  --   CALCIUM 9.3  --  8.8*  --    LFT Recent Labs    12/29/21 0050  PROT 6.4*  ALBUMIN 2.8*  AST 21  ALT 15  ALKPHOS 119  BILITOT 0.8   PT/INR Recent Labs    12/28/21 1041 12/29/21 0050  LABPROT 14.0 15.0  INR 1.1 1.2   Studies/Results: CT ANGIO GI BLEED  Result Date: 12/28/2021 CLINICAL DATA:  Evaluate for mesenteric ischemia and diverticular bleeding. EXAM: CTA ABDOMEN AND PELVIS WITHOUT AND WITH CONTRAST TECHNIQUE: Multidetector CT imaging of the abdomen and pelvis was performed using the standard protocol during bolus administration of intravenous contrast. Multiplanar reconstructed images and MIPs were obtained and reviewed to evaluate the vascular anatomy. RADIATION DOSE REDUCTION: This exam was performed according to the departmental dose-optimization program which includes automated exposure control, adjustment of the mA and/or kV according to patient size and/or use of iterative reconstruction technique. CONTRAST:  42mL OMNIPAQUE IOHEXOL 350 MG/ML SOLN COMPARISON:  None. FINDINGS: VASCULAR Aorta: Diffuse  atherosclerotic calcifications in the abdominal aorta without aneurysm, dissection or significant stenosis. Aorta at the level celiac trunk measures 2.8 cm. Celiac: Mild-to-moderate stenosis at the origin of the celiac trunk due to calcified plaque. There is ectasia or poststenotic dilatation of the celiac trunk measuring up to 1.1 cm. Splenic artery is heavily calcified. Common hepatic artery is patent. SMA: Calcified plaque at the origin of the SMA without significant stenosis. Areas of mild narrowing in the main SMA. Diffuse atherosclerotic disease in the SMA branches. No evidence for acute thrombus. Renals: Atherosclerotic disease involving bilateral renal arteries. Prominent noncalcified plaque in the mid left renal artery could be causing moderate to severe stenosis in this area. Early branching of the right renal artery with areas of stenosis and atherosclerotic disease beyond the branch point. No evidence for aneurysm or dissections involving the renal arteries. IMA: Patent Inflow: Common, internal and external iliac arteries are patent bilaterally with diffuse atherosclerotic calcifications. Mild stenosis near the origin of the left internal iliac artery. Proximal Outflow: Proximal femoral arteries are patent bilaterally. Veins: Normal appearance the IVC and iliac veins. Main portal venous system is patent. Review of the MIP images confirms the above findings. NON-VASCULAR Lower chest: Bilateral pleural effusions. Pleural effusions are small in size. Compressive atelectasis or consolidation in the right lower lobe. Pleural-based densities in the right middle lobe are nonspecific. 4 mm nodule in the left lower lobe on sequence 17 image 17 is probably calcified and suggestive for a calcified granuloma. Suspect underlying emphysema. Enhancement or mild pleural thickening associated with these pleural effusions. Hepatobiliary: Normal appearance of the liver. Mild  gallbladder distension without inflammatory  changes. Pancreas: Round low-density structure at the pancreatic head on sequence 7 image 72 measures 1.2 cm. This is suggestive for a small cystic lesion. No evidence for pancreatic duct dilatation or pancreatic inflammation. Spleen: Normal in size without focal abnormality. Adrenals/Urinary Tract: Adrenal glands are within the normal limits. Both kidneys are very atrophic. Multiple bilateral renal cysts. Negative for left hydronephrosis. There is fullness of the right renal pelvis without significant right caliceal dilatation. Ureters are not dilated. Bladder is decompressed but there is a diffuse bladder wall thickening which could be associated with under distension. Stomach/Bowel: Diverticula in the sigmoid colon without acute inflammation. No evidence for active GI bleeding. Normal appearance of the stomach. No bowel distention or obstruction. Lymphatic: No lymph node enlargement in the abdomen or pelvis. Reproductive: Prostate is unremarkable. Other: Negative for ascites or free air. Mild mesenteric stranding is nonspecific. Ventral hernia near the midline containing fat. Mild subcutaneous edema. Musculoskeletal: Multilevel disc space narrowing in lumbar spine. Mild anterolisthesis of L4 on L5. IMPRESSION: VASCULAR 1. No evidence for active GI bleeding. 2. Diffuse atherosclerotic disease in the abdomen and pelvis. Stenosis at the origin of the celiac trunk and bilateral renal arteries. 3.  Aortic Atherosclerosis (ICD10-I70.0). NON-VASCULAR 1. Colonic diverticulosis without acute bowel inflammation. 2. Bilateral pleural effusions with mild pleural thickening bilaterally. Extensive volume loss in the right lower lobe of uncertain etiology or chronicity. In addition, there is irregular pleural-based thickening along the right middle lobe. These pleural effusions are small in size. There may be underlying emphysema. These chest findings could be better evaluated with a dedicated chest CT. 3. Bilateral renal  atrophy with bilateral renal cysts. Dilatation of the right renal pelvis and suspect this is a chronic finding. 4. 1.2 cm low-density structure near the pancreatic head. This is suggestive for a small pancreatic cystic lesion. Consider follow-up imaging in 2 years. Electronically Signed   By: Markus Daft M.D.   On: 12/28/2021 13:41    IMPRESSION:  *GI bleed:  Upper vs lower.  Maroon colored stools.  No other symptoms.  BUN unreliable due to renal disease.  No NSAID use.  CTA negative for active bleeding or worrisome findings.  Nurse reports a blood-tinged stool overnight, but no BM today so far. *Acute on chronic anemia:  Baseline appears to be between 10-11 grams.  Was 7.5 grams at midnight.  2 units PRBCs transfused.  No repeat CBC yet. *ESRD on HD MWF  PLAN: -Continue pantoprazole 40 mg IV BID for now. -Monitor Hgb. -Will continue to observe for now due to patient's advanced age, high risk for procedure due to co-morbidities, and non-worrisome CTA.   Laban Emperor. Copeland Neisen  12/29/2021, 9:21 AM

## 2021-12-29 NOTE — Progress Notes (Addendum)
PROGRESS NOTE  Lonnie [name] QIO:[id number] DOB: 1925-10-05 DOA: 12/28/2021 PCP: Prince Solian, MD  HPI/Recap of past 24 hours:  Lonnie [name] is a 88 y.o. male with medical history significant for end-stage renal disease on hemodialysis on Monday, Wednesday, Friday, chronic anemia with baseline hemoglobin 10-11, GERD, acquired hypothyroidism, chronic systolic/diastolic heart failure, chronic hypoxic respiratory failure on 2 to 2.5 L continuous nasal cannula, who is admitted to [facility] on 12/28/2021 by way of transfer from [facility] emergency department with suspected acute upper gastrointestinal bleed after presenting from home to [facility] ED complaining of maroon-colored stool.  Work-up revealed symptomatic anemia with hemoglobin of 7.5 with positive FOBT.  He received 2 units PRBC on admission.  GI and nephrology consulted.  12/29/2021: Patient was seen and examined at bedside.  He denies any abdominal pain.  No bowel movements yet at the time of the visit.  HD completed today.  Assessment/Plan: Principal Problem:   Acute upper GI bleed Active Problems:   Depression   ESRD on dialysis (HCC)   Hypothyroidism   GI bleed   Acute on chronic anemia   Hyperkalemia   Leukocytosis  GI bleed, upper or lower. Presented with maroon-colored stool. Symptomatic anemia hemoglobin 7.5 with positive FOBT. Started on IV Protonix 40 mg twice daily. Received 2 unit PRBC transfusion for hemoglobin of 7.5 Maintain MAP greater than 65 Seen by GI, plan to manage conservatively. Continue to monitor for now.  Acute blood loss anemia in the setting of GI bleed. Post PRBC transfusion as stated above Continue to monitor H&H  ESRD on HD MWF History completed on 12/29/2021. Patient nephrology's assistance.  Hyperkalemia Serum potassium 6.5 Repeat potassium level after hemodialysis.  Chronic hypoxic respiratory failure. On 2 L nasal cannula continuously at baseline. Maintain O2  saturation greater than 90%.  Physical debility PT OT to assess At baseline he ambulates with a walker Fall precautions  Anemia of chronic disease in the setting of ESRD and iron deficiency Continue home iron supplementation Obtain iron studies in the morning.  Critical care time: 65 minutes   Code Status: DNR  Family Communication: None at bedside  Disposition Plan: Likely will discharge to home once GI signs off.   Consultants: GI Nephrology  Procedures: None  Antimicrobials: None  DVT prophylaxis: SCDs  Status is: Inpatient  Patient requires at least 2 midnights for further evaluation and treatment of present condition.      Objective: Vitals:   12/29/21 1130 12/29/21 1157 12/29/21 1215 12/29/21 1234  BP: (!) 97/47 (!) 93/48 (!) 112/46 (!) 106/40  Pulse: 71 75 74 70  Resp: (!) 21 (!) 25 (!) 22 19  Temp: 98.3 F (36.8 C) 98.2 F (36.8 C) 98.3 F (36.8 C) 98.5 F (36.9 C)  TempSrc: Axillary Oral  Oral  SpO2: 99% 99% 99%   Weight:      Height:        Intake/Output Summary (Last 24 hours) at 12/29/2021 1517 Last data filed at 12/29/2021 1202 Gross per 24 hour  Intake 630 ml  Output 31 ml  Net 599 ml   Filed Weights   12/28/21 1038 12/29/21 0841  Weight: 64.9 kg 57.4 kg    Exam:  General: 88 y.o. year-old male frail-appearing in no acute distress.  Alert and oriented x3. Cardiovascular: Regular rate and rhythm with no rubs or gallops.  No thyromegaly or JVD noted.   Respiratory: Clear to auscultation with no wheezes or rales. Good inspiratory effort. Abdomen: Soft  nontender nondistended with normal bowel sounds x4 quadrants. Musculoskeletal: No lower extremity edema bilaterally. Skin: No ulcerative lesions noted or rashes, Psychiatry: Mood is appropriate for condition and setting Neuro: Moves all 4 extremities, nonfocal exam.   Data Reviewed: CBC: Recent Labs  Lab 12/28/21 1041 12/28/21 2002 12/29/21 0050 12/29/21 1458  WBC 11.2*   --  9.2  --   HGB 9.0* 8.2* 7.5* 9.4*  HCT 28.9* 25.4* 24.0* 29.0*  MCV 105.1*  --  104.3*  --   PLT 265  --  235  --    Basic Metabolic Panel: Recent Labs  Lab 12/28/21 1041 12/28/21 2019 12/29/21 0050 12/29/21 0317  NA 142  --  140  --   K 5.5* 6.2* 6.5* 6.0*  CL 98  --  100  --   CO2 28  --  27  --   GLUCOSE 96  --  87  --   BUN 123*  --  135*  --   CREATININE 5.84*  --  6.45*  --   CALCIUM 9.3  --  8.8*  --   MG  --   --  2.7*  --   PHOS  --   --  5.5*  --    GFR: Estimated Creatinine Clearance: 5.7 mL/min (A) (by C-G formula based on SCr of 6.45 mg/dL (H)). Liver Function Tests: Recent Labs  Lab 12/28/21 1041 12/29/21 0050  AST 24 21  ALT 13 15  ALKPHOS 133* 119  BILITOT 0.5 0.8  PROT 7.2 6.4*  ALBUMIN 3.5 2.8*   No results for input(s): LIPASE, AMYLASE in the last 168 hours. No results for input(s): AMMONIA in the last 168 hours. Coagulation Profile: Recent Labs  Lab 12/28/21 1041 12/29/21 0050  INR 1.1 1.2   Cardiac Enzymes: No results for input(s): CKTOTAL, CKMB, CKMBINDEX, TROPONINI in the last 168 hours. BNP (last 3 results) No results for input(s): PROBNP in the last 8760 hours. HbA1C: No results for input(s): HGBA1C in the last 72 hours. CBG: No results for input(s): GLUCAP in the last 168 hours. Lipid Profile: No results for input(s): CHOL, HDL, LDLCALC, TRIG, CHOLHDL, LDLDIRECT in the last 72 hours. Thyroid Function Tests: No results for input(s): TSH, T4TOTAL, FREET4, T3FREE, THYROIDAB in the last 72 hours. Anemia Panel: Recent Labs    12/28/21 1041 12/28/21 2002 12/28/21 2019  FOLATE  --   --  42.9  FERRITIN  --  1,083*  --   TIBC  --  206*  --   IRON  --  63  --   RETICCTPCT 1.8  --   --    Urine analysis:    Component Value Date/Time   COLORURINE YELLOW 03/19/2016 1827   APPEARANCEUR CLEAR 03/19/2016 1827   LABSPEC 1.020 11/02/2020 1826   PHURINE 8.5 (H) 11/02/2020 1826   GLUCOSEU NEGATIVE 11/02/2020 1826   HGBUR TRACE  (A) 11/02/2020 1826   BILIRUBINUR NEGATIVE 11/02/2020 1826   KETONESUR NEGATIVE 11/02/2020 1826   PROTEINUR >=300 (A) 11/02/2020 1826   UROBILINOGEN 0.2 11/02/2020 1826   NITRITE NEGATIVE 11/02/2020 1826   LEUKOCYTESUR NEGATIVE 11/02/2020 1826   Sepsis Labs: @LABRCNTIP (procalcitonin:4,lacticidven:4)  ) Recent Results (from the past 240 hour(s))  Resp Panel by RT-PCR (Flu A&B, Covid) Nasopharyngeal Swab     Status: None   Collection Time: 12/28/21  4:23 PM   Specimen: Nasopharyngeal Swab; Nasopharyngeal(NP) swabs in vial transport medium  Result Value Ref Range Status   SARS Coronavirus 2 by RT PCR NEGATIVE NEGATIVE  Final    Comment: (NOTE) SARS-CoV-2 target nucleic acids are NOT DETECTED.  The SARS-CoV-2 RNA is generally detectable in upper respiratory specimens during the acute phase of infection. The lowest concentration of SARS-CoV-2 viral copies this assay can detect is 138 copies/mL. A negative result does not preclude SARS-Cov-2 infection and should not be used as the sole basis for treatment or other patient management decisions. A negative result may occur with  improper specimen collection/handling, submission of specimen other than nasopharyngeal swab, presence of viral mutation(s) within the areas targeted by this assay, and inadequate number of viral copies(<138 copies/mL). A negative result must be combined with clinical observations, patient history, and epidemiological information. The expected result is Negative.  Fact Sheet for Patients:  EntrepreneurPulse.com.au  Fact Sheet for Healthcare Providers:  IncredibleEmployment.be  This test is no t yet approved or cleared by the [place] FDA and  has been authorized for detection and/or diagnosis of SARS-CoV-2 by FDA under an Emergency Use Authorization (EUA). This EUA will remain  in effect (meaning this test can be used) for the duration of the COVID-19 declaration  under Section 564(b)(1) of the Act, 21 U.S.C.section 360bbb-3(b)(1), unless the authorization is terminated  or revoked sooner.       Influenza A by PCR NEGATIVE NEGATIVE Final   Influenza B by PCR NEGATIVE NEGATIVE Final    Comment: (NOTE) The Xpert Xpress SARS-CoV-2/FLU/RSV plus assay is intended as an aid in the diagnosis of influenza from Nasopharyngeal swab specimens and should not be used as a sole basis for treatment. Nasal washings and aspirates are unacceptable for Xpert Xpress SARS-CoV-2/FLU/RSV testing.  Fact Sheet for Patients: EntrepreneurPulse.com.au  Fact Sheet for Healthcare Providers: IncredibleEmployment.be  This test is not yet approved or cleared by the [place] FDA and has been authorized for detection and/or diagnosis of SARS-CoV-2 by FDA under an Emergency Use Authorization (EUA). This EUA will remain in effect (meaning this test can be used) for the duration of the COVID-19 declaration under Section 564(b)(1) of the Act, 21 U.S.C. section 360bbb-3(b)(1), unless the authorization is terminated or revoked.  Performed at [facility], [address] [place], [place] [address]       Studies: No results found.  Scheduled Meds:  Chlorhexidine Gluconate Cloth  6 each Topical Q0600   pantoprazole (PROTONIX) IV  40 mg Intravenous Q12H    Continuous Infusions:  ferric gluconate (FERRLECIT) IVPB 125 mg (12/29/21 1319)     LOS: 1 day     Kayleen Memos, MD [facility] Pager [phone]  If 7PM-7AM, please contact night-coverage [email or web address] Password [facility] 12/29/2021, 3:17 PM

## 2021-12-30 DIAGNOSIS — K922 Gastrointestinal hemorrhage, unspecified: Secondary | ICD-10-CM

## 2021-12-30 DIAGNOSIS — D500 Iron deficiency anemia secondary to blood loss (chronic): Secondary | ICD-10-CM

## 2021-12-30 LAB — TYPE AND SCREEN
ABO/RH(D): AB POS
Antibody Screen: NEGATIVE
Unit division: 0
Unit division: 0

## 2021-12-30 LAB — HEPATITIS B SURFACE ANTIBODY, QUANTITATIVE: Hep B S AB Quant (Post): 139.9 m[IU]/mL (ref >9.9)

## 2021-12-30 LAB — BPAM RBC
Blood Product Expiration Date: 202302272359
Blood Product Expiration Date: 202302282359
ISSUE DATE / TIME: 202301271018
ISSUE DATE / TIME: 202301271018
Unit Type and Rh: 8400
Unit Type and Rh: 8400

## 2021-12-30 LAB — CBC
HCT: 30.2 % — ABNORMAL LOW (ref 39.0–52.0)
Hemoglobin: 10 g/dL — ABNORMAL LOW (ref 13.0–17.0)
MCH: 32.9 pg (ref 26.0–34.0)
MCHC: 33.1 g/dL (ref 30.0–36.0)
MCV: 99.3 fL (ref 80.0–100.0)
Platelets: 210 10*3/uL (ref 150–400)
RBC: 3.04 MIL/uL — ABNORMAL LOW (ref 4.22–5.81)
RDW: 16.6 % — ABNORMAL HIGH (ref 11.5–15.5)
WBC: 6.8 10*3/uL (ref 4.0–10.5)
nRBC: 0 % (ref 0.0–0.2)

## 2021-12-30 LAB — POTASSIUM: Potassium: 4.3 mmol/L (ref 3.5–5.1)

## 2021-12-30 MED ORDER — CALCIUM ACETATE (PHOS BINDER) 667 MG PO CAPS
667.0000 mg | ORAL_CAPSULE | Freq: Three times a day (TID) | ORAL | Status: DC
  Administered 2021-12-30 – 2022-01-01 (×6): 667 mg via ORAL
  Filled 2021-12-30 (×7): qty 1

## 2021-12-30 MED ORDER — MIRTAZAPINE 15 MG PO TABS
15.0000 mg | ORAL_TABLET | Freq: Every day | ORAL | Status: DC
  Administered 2021-12-30 – 2021-12-31 (×2): 15 mg via ORAL
  Filled 2021-12-30 (×2): qty 1

## 2021-12-30 MED ORDER — VITAMIN B-12 100 MCG PO TABS
50.0000 ug | ORAL_TABLET | Freq: Every day | ORAL | Status: DC
  Administered 2021-12-30 – 2022-01-01 (×3): 50 ug via ORAL
  Filled 2021-12-30 (×4): qty 1

## 2021-12-30 MED ORDER — SEVELAMER CARBONATE 800 MG PO TABS
800.0000 mg | ORAL_TABLET | Freq: Three times a day (TID) | ORAL | Status: DC
  Administered 2021-12-30 – 2022-01-01 (×6): 800 mg via ORAL
  Filled 2021-12-30 (×7): qty 1

## 2021-12-30 MED ORDER — POLYETHYLENE GLYCOL 3350 17 G PO PACK: 17.0000 g | PACK | Freq: Every day | ORAL | Status: DC | PRN

## 2021-12-30 MED ORDER — DONEPEZIL HCL 10 MG PO TABS
10.0000 mg | ORAL_TABLET | Freq: Every day | ORAL | Status: DC
  Administered 2021-12-30 – 2021-12-31 (×2): 10 mg via ORAL
  Filled 2021-12-30 (×2): qty 1

## 2021-12-30 MED ORDER — LORATADINE 10 MG PO TABS
10.0000 mg | ORAL_TABLET | Freq: Every day | ORAL | Status: DC
  Administered 2021-12-30 – 2022-01-01 (×3): 10 mg via ORAL
  Filled 2021-12-30 (×3): qty 1

## 2021-12-30 MED ORDER — LEVOTHYROXINE SODIUM 112 MCG PO TABS
112.0000 ug | ORAL_TABLET | Freq: Every morning | ORAL | Status: DC
  Administered 2021-12-31 – 2022-01-01 (×2): 112 ug via ORAL
  Filled 2021-12-30 (×2): qty 1

## 2021-12-30 MED ORDER — RENA-VITE PO TABS
1.0000 | ORAL_TABLET | Freq: Every day | ORAL | Status: DC
  Administered 2021-12-30 – 2021-12-31 (×2): 1 via ORAL
  Filled 2021-12-30 (×2): qty 1

## 2021-12-30 NOTE — Plan of Care (Signed)
  Problem: Pain Managment: Goal: General experience of comfort will improve Outcome: Progressing   Problem: Safety: Goal: Ability to remain free from injury will improve Outcome: Progressing   Problem: Skin Integrity: Goal: Risk for impaired skin integrity will decrease Outcome: Progressing   

## 2021-12-30 NOTE — Progress Notes (Signed)
PROGRESS NOTE  Lonnie [name] MEQ:[id number] DOB: 03-12-1925 DOA: 12/28/2021 PCP: Prince Solian, MD  HPI/Recap of past 24 hours:  Lonnie [name] is a 88 y.o. male with medical history significant for end-stage renal disease on hemodialysis on Monday, Wednesday, Friday, chronic anemia with baseline hemoglobin 10-11, GERD, acquired hypothyroidism, chronic systolic/diastolic heart failure, chronic hypoxic respiratory failure on 2 to 2.5 L continuous nasal cannula, who is admitted to [facility] on 12/28/2021 by way of transfer from [facility] emergency department with suspected acute upper gastrointestinal bleed after presenting from home to [facility] ED complaining of maroon-colored stool.  Work-up revealed symptomatic anemia with hemoglobin of 7.5 with positive FOBT.  He received 2 units PRBC on admission.  GI and nephrology consulted.  HD on 12/29/2021.  GI will manage his GI bleed conservatively, continue IV Protonix twice daily for now as recommended by GI.  12/30/2021: Patient was seen and examined at his bedside this morning.  He states he feels better this morning.  He had a bowel movement did not look to see the color.  He is alert and oriented x3.  Denies any abdominal pain or nausea.  He wants a regular diet.  Assessment/Plan: Principal Problem:   Acute upper GI bleed Active Problems:   Depression   ESRD on dialysis (HCC)   Hypothyroidism   GI bleed   Acute on chronic anemia   Hyperkalemia   Leukocytosis   Hematochezia  GI bleed, upper or lower. Presented with maroon-colored stool. Symptomatic anemia hemoglobin 7.5 with positive FOBT. Started on IV Protonix 40 mg twice daily. Received 2 unit PRBC transfusion for hemoglobin of 7.5 Maintain MAP greater than 65 Seen by GI, plan to manage conservatively. Continue to monitor for now. Hemoglobin stable 10.0 from 9.4, uptrending.  Resolved acute blood loss anemia in the setting of GI bleed, suspected diverticular  bleed. Post PRBC transfusion as stated above H&H is now stable, no reported recurrent GI bleed Continue to monitor H&H  ESRD on HD MWF History completed on 12/29/2021. Patient nephrology's assistance.  Hyperkalemia Serum potassium 6.5 Repeat potassium level after hemodialysis.  Chronic hypoxic respiratory failure. On 2 L nasal cannula continuously at baseline. Maintain O2 saturation greater than 90%.  Physical debility PT OT to assess, pending assessment. At baseline he ambulates with a walker Fall precautions  Anemia of chronic disease in the setting of ESRD and iron deficiency Continue home iron supplementation Obtain iron studies in the morning.   Code Status: DNR  Family Communication: None at bedside  Disposition Plan: Likely will discharge to home once GI and nephrology sign off.   Consultants: GI Nephrology  Procedures: None  Antimicrobials: None  DVT prophylaxis: SCDs  Status is: Inpatient  Patient requires at least 2 midnights for further evaluation and treatment of present condition.      Objective: Vitals:   12/29/21 1234 12/29/21 2112 12/30/21 0500 12/30/21 0730  BP: (!) 106/40 (!) 126/57 (!) 107/51 (!) 126/52  Pulse: 70 69 70 69  Resp: 19 18  18   Temp: 98.5 F (36.9 C) 98.8 F (37.1 C)  98.6 F (37 C)  TempSrc: Oral Oral  Oral  SpO2:  93% 97% 96%  Weight:   63 kg   Height:        Intake/Output Summary (Last 24 hours) at 12/30/2021 1143 Last data filed at 12/30/2021 0646 Gross per 24 hour  Intake 850 ml  Output 31 ml  Net 819 ml   Filed Weights   12/28/21  1038 12/29/21 0841 12/30/21 0500  Weight: 64.9 kg 57.4 kg 63 kg    Exam:  General: 88 y.o. year-old male frail-appearing no acute distress.  He is alert and oriented x3.   Cardiovascular: Regular rate and rhythm no rubs or gallops. Respiratory: Clear to auscultation no wheezes or rales.   Abdomen: Soft nontender no bowel sounds present. Musculoskeletal: Lower extremity  edema bilaterally. Skin: No ulcerative lesions noted. Psychiatry: Mood is appropriate to condition for now. Neuro: Moves all 4 extremities nonfocal exam   Data Reviewed: CBC: Recent Labs  Lab 12/28/21 1041 12/28/21 2002 12/29/21 0050 12/29/21 1458 12/30/21 0707  WBC 11.2*  --  9.2  --  6.8  HGB 9.0* 8.2* 7.5* 9.4* 10.0*  HCT 28.9* 25.4* 24.0* 29.0* 30.2*  MCV 105.1*  --  104.3*  --  99.3  PLT 265  --  235  --  742   Basic Metabolic Panel: Recent Labs  Lab 12/28/21 1041 12/28/21 2019 12/29/21 0050 12/29/21 0317 12/29/21 1458 12/29/21 1817 12/29/21 2202 12/30/21 0223  NA 142  --  140  --   --   --   --   --   K 5.5*   < > 6.5* 6.0* 3.9 4.1 4.1 4.3  CL 98  --  100  --   --   --   --   --   CO2 28  --  27  --   --   --   --   --   GLUCOSE 96  --  87  --   --   --   --   --   BUN 123*  --  135*  --   --   --   --   --   CREATININE 5.84*  --  6.45*  --   --   --   --   --   CALCIUM 9.3  --  8.8*  --   --   --   --   --   MG  --   --  2.7*  --   --   --   --   --   PHOS  --   --  5.5*  --   --   --   --   --    < > = values in this interval not displayed.   GFR: Estimated Creatinine Clearance: 6.2 mL/min (A) (by C-G formula based on SCr of 6.45 mg/dL (H)). Liver Function Tests: Recent Labs  Lab 12/28/21 1041 12/29/21 0050  AST 24 21  ALT 13 15  ALKPHOS 133* 119  BILITOT 0.5 0.8  PROT 7.2 6.4*  ALBUMIN 3.5 2.8*   No results for input(s): LIPASE, AMYLASE in the last 168 hours. No results for input(s): AMMONIA in the last 168 hours. Coagulation Profile: Recent Labs  Lab 12/28/21 1041 12/29/21 0050  INR 1.1 1.2   Cardiac Enzymes: No results for input(s): CKTOTAL, CKMB, CKMBINDEX, TROPONINI in the last 168 hours. BNP (last 3 results) No results for input(s): PROBNP in the last 8760 hours. HbA1C: No results for input(s): HGBA1C in the last 72 hours. CBG: No results for input(s): GLUCAP in the last 168 hours. Lipid Profile: No results for input(s): CHOL,  HDL, LDLCALC, TRIG, CHOLHDL, LDLDIRECT in the last 72 hours. Thyroid Function Tests: No results for input(s): TSH, T4TOTAL, FREET4, T3FREE, THYROIDAB in the last 72 hours. Anemia Panel: Recent Labs    12/28/21 1041 12/28/21 2002 12/28/21 2019  FOLATE  --   --  42.9  FERRITIN  --  1,083*  --   TIBC  --  206*  --   IRON  --  63  --   RETICCTPCT 1.8  --   --    Urine analysis:    Component Value Date/Time   COLORURINE YELLOW 03/19/2016 1827   APPEARANCEUR CLEAR 03/19/2016 1827   LABSPEC 1.020 11/02/2020 1826   PHURINE 8.5 (H) 11/02/2020 1826   GLUCOSEU NEGATIVE 11/02/2020 1826   HGBUR TRACE (A) 11/02/2020 1826   BILIRUBINUR NEGATIVE 11/02/2020 1826   KETONESUR NEGATIVE 11/02/2020 1826   PROTEINUR >=300 (A) 11/02/2020 1826   UROBILINOGEN 0.2 11/02/2020 1826   NITRITE NEGATIVE 11/02/2020 1826   LEUKOCYTESUR NEGATIVE 11/02/2020 1826   Sepsis Labs: @LABRCNTIP (procalcitonin:4,lacticidven:4)  ) Recent Results (from the past 240 hour(s))  Resp Panel by RT-PCR (Flu A&B, Covid) Nasopharyngeal Swab     Status: None   Collection Time: 12/28/21  4:23 PM   Specimen: Nasopharyngeal Swab; Nasopharyngeal(NP) swabs in vial transport medium  Result Value Ref Range Status   SARS Coronavirus 2 by RT PCR NEGATIVE NEGATIVE Final    Comment: (NOTE) SARS-CoV-2 target nucleic acids are NOT DETECTED.  The SARS-CoV-2 RNA is generally detectable in upper respiratory specimens during the acute phase of infection. The lowest concentration of SARS-CoV-2 viral copies this assay can detect is 138 copies/mL. A negative result does not preclude SARS-Cov-2 infection and should not be used as the sole basis for treatment or other patient management decisions. A negative result may occur with  improper specimen collection/handling, submission of specimen other than nasopharyngeal swab, presence of viral mutation(s) within the areas targeted by this assay, and inadequate number of viral copies(<138  copies/mL). A negative result must be combined with clinical observations, patient history, and epidemiological information. The expected result is Negative.  Fact Sheet for Patients:  EntrepreneurPulse.com.au  Fact Sheet for Healthcare Providers:  IncredibleEmployment.be  This test is no t yet approved or cleared by the [place] FDA and  has been authorized for detection and/or diagnosis of SARS-CoV-2 by FDA under an Emergency Use Authorization (EUA). This EUA will remain  in effect (meaning this test can be used) for the duration of the COVID-19 declaration under Section 564(b)(1) of the Act, 21 U.S.C.section 360bbb-3(b)(1), unless the authorization is terminated  or revoked sooner.       Influenza A by PCR NEGATIVE NEGATIVE Final   Influenza B by PCR NEGATIVE NEGATIVE Final    Comment: (NOTE) The Xpert Xpress SARS-CoV-2/FLU/RSV plus assay is intended as an aid in the diagnosis of influenza from Nasopharyngeal swab specimens and should not be used as a sole basis for treatment. Nasal washings and aspirates are unacceptable for Xpert Xpress SARS-CoV-2/FLU/RSV testing.  Fact Sheet for Patients: EntrepreneurPulse.com.au  Fact Sheet for Healthcare Providers: IncredibleEmployment.be  This test is not yet approved or cleared by the [place] FDA and has been authorized for detection and/or diagnosis of SARS-CoV-2 by FDA under an Emergency Use Authorization (EUA). This EUA will remain in effect (meaning this test can be used) for the duration of the COVID-19 declaration under Section 564(b)(1) of the Act, 21 U.S.C. section 360bbb-3(b)(1), unless the authorization is terminated or revoked.  Performed at [facility], [address], [place], [place] [address]       Studies: No results found.  Scheduled Meds:  Chlorhexidine Gluconate Cloth  6 each Topical Q0600    pantoprazole (PROTONIX) IV  40 mg Intravenous Q12H    Continuous Infusions:  ferric  gluconate (FERRLECIT) IVPB 125 mg (12/29/21 1319)     LOS: 2 days     Kayleen Memos, MD [facility] Pager [phone]  If 7PM-7AM, please contact night-coverage [email or web address] Password [facility] 12/30/2021, 11:43 AM

## 2021-12-30 NOTE — Evaluation (Signed)
Physical Therapy Evaluation Patient Details Name: Lonnie [name] MRN: [id number] DOB: October 24, 1925 Today's Date: 12/30/2021  History of Present Illness  Pt is a 88 y.o. M who presents with suspected acute upper GI bleed. Work up revealed sympatomica anemia with hemoglob of 7.5 with positive FOBT. Seen by GI, plan to manage conservatively. Significant PMH: ESRD on MWF dialysis, chronic anemia, chronic systolic/diastolic heart failure, chronic hypoxic respiratory failure on 2-2.5 L continuous nasal cannula.  Clinical Impression  PTA, pt lives with his family, uses a Rollator for limited household ambulation and requires assist for ADL's. Pt sleepy throughout evaluation; denies pain. Requiring min-mod assist for functional mobility (+2 safety). Pt transferring to and from bedside commode with a walker. Had small amount of dark colored stool and RN notified. Suspect steady progress with improved alertness. Recommend HHPT at discharge.      Recommendations for follow up therapy are one component of a multi-disciplinary discharge planning process, led by the attending physician.  Recommendations may be updated based on patient status, additional functional criteria and insurance authorization.  Follow Up Recommendations Home health PT    Assistance Recommended at Discharge Frequent or constant Supervision/Assistance  Patient can return home with the following  A lot of help with walking and/or transfers;A lot of help with bathing/dressing/bathroom;Assistance with cooking/housework;Help with stairs or ramp for entrance    Equipment Recommendations None recommended by PT (pt has needed DME)  Recommendations for Other Services       Functional Status Assessment Patient has had a recent decline in their functional status and demonstrates the ability to make significant improvements in function in a reasonable and predictable amount of time.     Precautions / Restrictions Precautions Precautions:  Fall Restrictions Weight Bearing Restrictions: No      Mobility  Bed Mobility Overal bed mobility: Needs Assistance Bed Mobility: Supine to Sit, Rolling Rolling: Min assist   Supine to sit: Mod assist, +2 for physical assistance     General bed mobility comments: Assist for BLE initiation, use of bed pad to scoot hips forward, trunk to upright    Transfers Overall transfer level: Needs assistance Equipment used: Rolling walker (2 wheels) Transfers: Sit to/from Stand, Bed to chair/wheelchair/BSC Sit to Stand: Mod assist   Step pivot transfers: Min assist, +2 safety/equipment       General transfer comment: Pt requiring modA to rise from edge of bed and BSC, minA for pivotal steps from [facility] to recliner with cues for stepping initiation and sequencing    Ambulation/Gait                  Stairs            Wheelchair Mobility    Modified Rankin (Stroke Patients Only)       Balance Overall balance assessment: Needs assistance Sitting-balance support: Feet supported Sitting balance-Leahy Scale: Fair     Standing balance support: During functional activity, Reliant on assistive device for balance Standing balance-Leahy Scale: Poor                               Pertinent Vitals/Pain Pain Assessment Pain Assessment: No/denies pain    Home Living Family/patient expects to be discharged to:: Private residence Living Arrangements: Other relatives;Other (Comment) (2 nephews that split the week) Available Help at Discharge: Family;Personal care attendant (aide 1 hr/day x7 days) Type of Home: House Home Access: Stairs to enter Entrance Stairs-Rails: Psychiatric nurse  of Steps: 4-5   Home Layout: One level Home Equipment: Rollator (4 wheels);Cane - single point;BSC/3in1;Shower seat;Electric scooter      Prior Function Prior Level of Function : Needs assist       Physical Assist : Mobility (physical);ADLs (physical)    ADLs (physical): Bathing;Dressing Mobility Comments: using Rollator       Hand Dominance   Dominant Hand: Right    Extremity/Trunk Assessment   Upper Extremity Assessment Upper Extremity Assessment: Defer to OT evaluation    Lower Extremity Assessment Lower Extremity Assessment: Generalized weakness    Cervical / Trunk Assessment Cervical / Trunk Assessment: Kyphotic  Communication   Communication: HOH  Cognition Arousal/Alertness: Lethargic Behavior During Therapy: WFL for tasks assessed/performed Overall Cognitive Status: No family/caregiver present to determine baseline cognitive functioning                                 General Comments: Pt very drowsy, able to follow 1 step commands with increased time and repetition (also very HOH and did not have hearing aids in).        General Comments General comments (skin integrity, edema, etc.): VSS on 2.5 L O2    Exercises     Assessment/Plan    PT Assessment Patient needs continued PT services  PT Problem List Decreased strength;Decreased activity tolerance;Decreased balance;Decreased mobility       PT Treatment Interventions DME instruction;Gait training;Functional mobility training;Therapeutic activities;Therapeutic exercise;Balance training;Patient/family education    PT Goals (Current goals can be found in the Care Plan section)  Acute Rehab PT Goals Patient Stated Goal: did not state PT Goal Formulation: With patient Time For Goal Achievement: 01/13/22 Potential to Achieve Goals: Fair    Frequency Min 3X/week     Co-evaluation PT/OT/SLP Co-Evaluation/Treatment: Yes Reason for Co-Treatment: For patient/therapist safety;To address functional/ADL transfers PT goals addressed during session: Mobility/safety with mobility         AM-PAC PT "6 Clicks" Mobility  Outcome Measure Help needed turning from your back to your side while in a flat bed without using bedrails?: A Little Help  needed moving from lying on your back to sitting on the side of a flat bed without using bedrails?: A Lot Help needed moving to and from a bed to a chair (including a wheelchair)?: A Lot Help needed standing up from a chair using your arms (e.g., wheelchair or bedside chair)?: A Lot Help needed to walk in hospital room?: Total Help needed climbing 3-5 steps with a railing? : Total 6 Click Score: 11    End of Session Equipment Utilized During Treatment: Gait belt;Oxygen Activity Tolerance: Patient tolerated treatment well Patient left: in chair;with call bell/phone within reach;with chair alarm set Nurse Communication: Mobility status;Other (comment) (pt had BM) PT Visit Diagnosis: Unsteadiness on feet (R26.81);Muscle weakness (generalized) (M62.81);Difficulty in walking, not elsewhere classified (R26.2)    Time: 0938-1829 PT Time Calculation (min) (ACUTE ONLY): 44 min   Charges:   PT Evaluation $PT Eval Moderate Complexity: 1 Mod PT Treatments $Therapeutic Activity: 8-22 mins        Wyona Almas, PT, DPT Acute Rehabilitation Services Pager [phone] Office [phone]   Deno Etienne 12/30/2021, 1:24 PM

## 2021-12-30 NOTE — Evaluation (Signed)
Occupational Therapy Evaluation Patient Details Name: Lonnie [name] MRN: [id number] DOB: 1925-03-13 Today's Date: 12/30/2021   History of Present Illness Pt is a 88 y.o. M who presents with suspected acute upper GI bleed. Work up revealed sympatomica anemia with hemoglob of 7.5 with positive FOBT. Seen by GI, plan to manage conservatively. Significant PMH: ESRD on MWF dialysis, chronic anemia, chronic systolic/diastolic heart failure, chronic hypoxic respiratory failure on 2-2.5 L continuous nasal cannula.   Clinical Impression   Pt has caregiver at baseline who comes 1 hr/day x7 days, requires assist for bathing and dressing ADLs, uses rollator for mobility. Pt's nephews stay with him all other times. Pt currently mod-modA +2 for ADLs, transfers, and bed mobility. Pt benefits from repetition and increased cuing during session for safety/hand placement. Pt presenting with impairments listed below, will follow acutely. Pt will need 24/7 supervision if d/cing home with continued assistance for ADLs, if unable will need SNF.     Recommendations for follow up therapy are one component of a multi-disciplinary discharge planning process, led by the attending physician.  Recommendations may be updated based on patient status, additional functional criteria and insurance authorization.   Follow Up Recommendations  Home health OT    Assistance Recommended at Discharge Intermittent Supervision/Assistance  Patient can return home with the following A little help with walking and/or transfers;A lot of help with bathing/dressing/bathroom;Assistance with cooking/housework;Help with stairs or ramp for entrance;Assist for transportation    Functional Status Assessment  Patient has had a recent decline in their functional status and demonstrates the ability to make significant improvements in function in a reasonable and predictable amount of time.  Equipment Recommendations  Other (comment);None  recommended by OT (pt has all needed DME)    Recommendations for Other Services PT consult     Precautions / Restrictions Precautions Precautions: Fall Restrictions Weight Bearing Restrictions: No      Mobility Bed Mobility Overal bed mobility: Needs Assistance Bed Mobility: Supine to Sit     Supine to sit: Mod assist, +2 for physical assistance          Transfers Overall transfer level: Needs assistance Equipment used: Rolling walker (2 wheels) Transfers: Sit to/from Stand, Bed to chair/wheelchair/BSC Sit to Stand: Mod assist                  Balance Overall balance assessment: Needs assistance Sitting-balance support: Feet supported Sitting balance-Leahy Scale: Fair     Standing balance support: During functional activity, Reliant on assistive device for balance Standing balance-Leahy Scale: Poor                             ADL either performed or assessed with clinical judgement   ADL Overall ADL's : Needs assistance/impaired Eating/Feeding: Set up;Sitting   Grooming: Set up;Sitting   Upper Body Bathing: Moderate assistance;Sitting   Lower Body Bathing: Moderate assistance;Maximal assistance;Sitting/lateral leans   Upper Body Dressing : Moderate assistance;Sitting   Lower Body Dressing: Moderate assistance;Maximal assistance;Sitting/lateral leans   Toilet Transfer: Moderate assistance;Rolling walker (2 wheels);BSC/3in1;Stand-pivot Toilet Transfer Details (indicate cue type and reason): to [place] Manipulation and Hygiene: Total assistance;Sitting/lateral lean Toileting - Clothing Manipulation Details (indicate cue type and reason): total A for pericare     Functional mobility during ADLs: Moderate assistance       Vision   Vision Assessment?: No apparent visual deficits     Perception     Praxis  Pertinent Vitals/Pain Pain Assessment Pain Assessment: No/denies pain     Hand Dominance Right    Extremity/Trunk Assessment Upper Extremity Assessment Upper Extremity Assessment: Generalized weakness   Lower Extremity Assessment Lower Extremity Assessment: Defer to PT evaluation   Cervical / Trunk Assessment Cervical / Trunk Assessment: Kyphotic   Communication Communication Communication: HOH   Cognition Arousal/Alertness: Lethargic Behavior During Therapy: WFL for tasks assessed/performed Overall Cognitive Status: No family/caregiver present to determine baseline cognitive functioning                                       General Comments  VSS on 2.5L O2    Exercises     Shoulder Instructions      Home Living Family/patient expects to be discharged to:: Private residence Living Arrangements: Other relatives;Other (Comment) (2 nephews that split the week) Available Help at Discharge: Family;Personal care attendant (aide 1 hr/day x7 days) Type of Home: House Home Access: Stairs to enter [employer] of Steps: 4-5 Entrance Stairs-Rails: Right;Left Home Layout: One level     Bathroom Shower/Tub: Sponge bathes at baseline   Bathroom Toilet: Standard Bathroom Accessibility: Yes   Home Equipment: Rollator (4 wheels);Cane - single point;BSC/3in1;Shower seat;Electric scooter          Prior Functioning/Environment Prior Level of Function : Needs assist       Physical Assist : Mobility (physical);ADLs (physical)   ADLs (physical): Bathing;Dressing Mobility Comments: using Rollator          OT Problem List: Decreased strength;Decreased range of motion;Decreased activity tolerance;Impaired balance (sitting and/or standing)      OT Treatment/Interventions: Self-care/ADL training;Therapeutic exercise;Cognitive remediation/compensation;Therapeutic activities;Patient/family education;Balance training    OT Goals(Current goals can be found in the care plan section) Acute Rehab OT Goals Patient Stated Goal: none stated OT Goal  Formulation: With patient Time For Goal Achievement: 01/06/22 Potential to Achieve Goals: Fair ADL Goals Pt Will Perform Upper Body Dressing: with min assist;sitting Pt Will Perform Lower Body Dressing: with min assist;with caregiver independent in assisting;sit to/from stand Pt Will Transfer to Toilet: with min assist;bedside commode;stand pivot transfer Pt Will Perform Tub/Shower Transfer: with mod assist;ambulating;shower seat;rolling walker  OT Frequency: Min 2X/week    Co-evaluation PT/OT/SLP Co-Evaluation/Treatment: Yes Reason for Co-Treatment: Complexity of the patient's  Impairments (multi-system involvement);For Administrator; To address functional/ADL transfers OT goals addressed during session: ADL's and self-care;Proper use of  Adaptive equipment and DME         \    AM-PAC OT "6 Clicks" Daily Activity     Outcome Measure Help from another person eating meals?: None Help from another person taking care of personal grooming?: A Little Help from another person toileting, which includes using toliet, bedpan, or urinal?: A Lot Help from another person bathing (including washing, rinsing, drying)?: A Lot Help from another person to put on and taking off regular upper body clothing?: A Lot Help from another person to put on and taking off regular lower body clothing?: A Lot 6 Click Score: 15   End of Session Equipment Utilized During Treatment: Gait belt;Rolling walker (2 wheels) Nurse Communication: Mobility status;Other (comment) (pt had BM during session)  Activity Tolerance: Patient tolerated treatment well Patient left: in chair;with call bell/phone within reach;with chair alarm set  OT Visit Diagnosis: Unsteadiness on feet (R26.81);Other abnormalities of gait and mobility (R26.89);Muscle weakness (generalized) (M62.81)  Time: 8948-3475 OT Time Calculation (min): 22 min Charges:  OT General Charges $OT Visit: 1 Visit OT Evaluation $OT  Eval Moderate Complexity: 1 [address], OTD, OTR/L Acute Rehab [phone] - [place] 12/30/2021, 12:37 PM

## 2021-12-30 NOTE — Progress Notes (Signed)
[facility] Progress Note  CC:  GI bleed  Subjective:  He denies any nausea or abdominal pain, but says that he "just doesn't feel good."  Nursing reports no further sign of blood in stool over the past 24 hours.  Objective:  Vital signs in last 24 hours: Temp:  [98.2 F (36.8 C)-98.8 F (37.1 C)] 98.6 F (37 C) (01/28 0730) Pulse Rate:  [69-75] 69 (01/28 0730) Resp:  [18-25] 18 (01/28 0730) BP: (93-126)/(40-57) 126/52 (01/28 0730) SpO2:  [93 %-99 %] 96 % (01/28 0730) Weight:  [01 kg] 63 kg (01/28 0500) Last BM Date: 12/30/21 General:  Alert, frail, in NAD Heart:  Regular rate and rhythm; no murmurs Pulm:  CTAB.  No W/R/R. Abdomen:  Soft, non-distended.  BS present.  Non-tender. Extremities:  Without edema.  Intake/Output from previous day: 01/27 0701 - 01/28 0700 In: 850 [Blood:630; IV Piggyback:220] Out: 31   Lab Results: Recent Labs    12/28/21 1041 12/28/21 2002 12/29/21 0050 12/29/21 1458 12/30/21 0707  WBC 11.2*  --  9.2  --  6.8  HGB 9.0*   < > 7.5* 9.4* 10.0*  HCT 28.9*   < > 24.0* 29.0* 30.2*  PLT 265  --  235  --  210   < > = values in this interval not displayed.   BMET Recent Labs    12/28/21 1041 12/28/21 2019 12/29/21 0050 12/29/21 0317 12/29/21 1817 12/29/21 2202 12/30/21 0223  NA 142  --  140  --   --   --   --   K 5.5*   < > 6.5*   < > 4.1 4.1 4.3  CL 98  --  100  --   --   --   --   CO2 28  --  27  --   --   --   --   GLUCOSE 96  --  87  --   --   --   --   BUN 123*  --  135*  --   --   --   --   CREATININE 5.84*  --  6.45*  --   --   --   --   CALCIUM 9.3  --  8.8*  --   --   --   --    < > = values in this interval not displayed.   LFT Recent Labs    12/29/21 0050  PROT 6.4*  ALBUMIN 2.8*  AST 21  ALT 15  ALKPHOS 119  BILITOT 0.8   PT/INR Recent Labs    12/28/21 1041 12/29/21 0050  LABPROT 14.0 15.0  INR 1.1 1.2   Hepatitis Panel Recent Labs    12/28/21 2019  HEPBSAG NON REACTIVE    CT  ANGIO GI BLEED  Result Date: 12/28/2021 CLINICAL DATA:  Evaluate for mesenteric ischemia and diverticular bleeding. EXAM: CTA ABDOMEN AND PELVIS WITHOUT AND WITH CONTRAST TECHNIQUE: Multidetector CT imaging of the abdomen and pelvis was performed using the standard protocol during bolus administration of intravenous contrast. Multiplanar reconstructed images and MIPs were obtained and reviewed to evaluate the vascular anatomy. RADIATION DOSE REDUCTION: This exam was performed according to the departmental dose-optimization program which includes automated exposure control, adjustment of the mA and/or kV according to patient size and/or use of iterative reconstruction technique. CONTRAST:  32mL OMNIPAQUE IOHEXOL 350 MG/ML SOLN COMPARISON:  None. FINDINGS: VASCULAR Aorta: Diffuse atherosclerotic calcifications in the abdominal aorta without aneurysm, dissection or significant  stenosis. Aorta at the level celiac trunk measures 2.8 cm. Celiac: Mild-to-moderate stenosis at the origin of the celiac trunk due to calcified plaque. There is ectasia or poststenotic dilatation of the celiac trunk measuring up to 1.1 cm. Splenic artery is heavily calcified. Common hepatic artery is patent. SMA: Calcified plaque at the origin of the SMA without significant stenosis. Areas of mild narrowing in the main SMA. Diffuse atherosclerotic disease in the SMA branches. No evidence for acute thrombus. Renals: Atherosclerotic disease involving bilateral renal arteries. Prominent noncalcified plaque in the mid left renal artery could be causing moderate to severe stenosis in this area. Early branching of the right renal artery with areas of stenosis and atherosclerotic disease beyond the branch point. No evidence for aneurysm or dissections involving the renal arteries. IMA: Patent Inflow: Common, internal and external iliac arteries are patent bilaterally with diffuse atherosclerotic calcifications. Mild stenosis near the origin of the  left internal iliac artery. Proximal Outflow: Proximal femoral arteries are patent bilaterally. Veins: Normal appearance the IVC and iliac veins. Main portal venous system is patent. Review of the MIP images confirms the above findings. NON-VASCULAR Lower chest: Bilateral pleural effusions. Pleural effusions are small in size. Compressive atelectasis or consolidation in the right lower lobe. Pleural-based densities in the right middle lobe are nonspecific. 4 mm nodule in the left lower lobe on sequence 17 image 17 is probably calcified and suggestive for a calcified granuloma. Suspect underlying emphysema. Enhancement or mild pleural thickening associated with these pleural effusions. Hepatobiliary: Normal appearance of the liver. Mild gallbladder distension without inflammatory changes. Pancreas: Round low-density structure at the pancreatic head on sequence 7 image 72 measures 1.2 cm. This is suggestive for a small cystic lesion. No evidence for pancreatic duct dilatation or pancreatic inflammation. Spleen: Normal in size without focal abnormality. Adrenals/Urinary Tract: Adrenal glands are within the normal limits. Both kidneys are very atrophic. Multiple bilateral renal cysts. Negative for left hydronephrosis. There is fullness of the right renal pelvis without significant right caliceal dilatation. Ureters are not dilated. Bladder is decompressed but there is a diffuse bladder wall thickening which could be associated with under distension. Stomach/Bowel: Diverticula in the sigmoid colon without acute inflammation. No evidence for active GI bleeding. Normal appearance of the stomach. No bowel distention or obstruction. Lymphatic: No lymph node enlargement in the abdomen or pelvis. Reproductive: Prostate is unremarkable. Other: Negative for ascites or free air. Mild mesenteric stranding is nonspecific. Ventral hernia near the midline containing fat. Mild subcutaneous edema. Musculoskeletal: Multilevel disc space  narrowing in lumbar spine. Mild anterolisthesis of L4 on L5. IMPRESSION: VASCULAR 1. No evidence for active GI bleeding. 2. Diffuse atherosclerotic disease in the abdomen and pelvis. Stenosis at the origin of the celiac trunk and bilateral renal arteries. 3.  Aortic Atherosclerosis (ICD10-I70.0). NON-VASCULAR 1. Colonic diverticulosis without acute bowel inflammation. 2. Bilateral pleural effusions with mild pleural thickening bilaterally. Extensive volume loss in the right lower lobe of uncertain etiology or chronicity. In addition, there is irregular pleural-based thickening along the right middle lobe. These pleural effusions are small in size. There may be underlying emphysema. These chest findings could be better evaluated with a dedicated chest CT. 3. Bilateral renal atrophy with bilateral renal cysts. Dilatation of the right renal pelvis and suspect this is a chronic finding. 4. 1.2 cm low-density structure near the pancreatic head. This is suggestive for a small pancreatic cystic lesion. Consider follow-up imaging in 2 years. Electronically Signed   By: Scherrie Gerlach.D.  On: 12/28/2021 13:41    Assessment / Plan: *GI bleed:  Upper vs lower.  Maroon colored stools.  No other symptoms.  BUN unreliable due to renal disease.  No NSAID use.  CTA negative for active bleeding or worrisome findings.  Nursing reports no sign of blood in stools over the past 24 hours. *Acute on chronic anemia:  Baseline appears to be between 10-11 grams.  Was 7.5 grams at midnight.  2 units PRBCs transfused.  Hgb 10 grams this AM. *ESRD on HD MWF  -Continue pantoprazole 40 mg BID for now. -Monitor Hgb. -Ok to advance diet. -Would continue to observe for now due to patient's advanced age, high risk for procedure due to co-morbidities, and non-worrisome CTA. -GI signing off.  Please feel free to call with questions.   LOS: 2 days   Lonnie [name]. Lonnie [name]  12/30/2021, 11:34 AM

## 2021-12-30 NOTE — TOC Initial Note (Signed)
Transition of Care [facility]) - Initial/Assessment Note    Patient Details  Name: Lonnie [name] MRN: [id number] Date of Birth: 1925-01-27  Transition of Care [facility]) CM/SW Contact:    Bartholomew Crews, RN Phone Number: [phone] 12/30/2021, 3:43 PM  Clinical Narrative:                  Spoke with patient at the bedside to discuss post acute transition. Patient very hard of hearing. Demographics verified. Follow up call to patient's niece, Baird Kay. PTA home with 2 nephews who provide transportation for dialysis. Agreeable to [facility] PT/OT. Offered choice. [facility] can accept with delayed SOC vs [facility] pending authorization. Patient will need [facility] PT/OT order with Face to Face. No DME needs. Family to provide transportation at discharge. TOC following for transition needs.   Expected Discharge Plan: [place] Barriers to Discharge: Continued Medical Work up   Patient Goals and CMS Choice Patient states their goals for this hospitalization and ongoing recovery are:: return home with nephews CMS Medicare.gov Compare Post Acute Care list provided to:: Patient Choice offered to / list presented to : Patient  Expected Discharge Plan and Services Expected Discharge Plan: [place] In-house Referral: NA Discharge Planning Services: CM Consult Post Acute Care Choice: [place] arrangements for the past 2 months: Single Family Home                 DME Arranged: N/A DME Agency: NA       HH Arranged: PT, OT          Prior Living Arrangements/Services Living arrangements for the past 2 months: Single Family Home Lives with:: Self, Relatives Patient language and need for interpreter reviewed:: Yes Do you feel safe going back to the place where you live?: Yes      Need for Family Participation in Patient Care: Yes (Comment) Care giver support system in place?: Yes (comment) Current home services: DME Criminal Activity/Legal Involvement Pertinent to  Current Situation/Hospitalization: No - Comment as needed  Activities of Daily Living Home Assistive Devices/Equipment: Wheelchair ADL Screening (condition at time of admission) Patient's cognitive ability adequate to safely complete daily activities?: Yes Is the patient deaf or have difficulty hearing?: Yes Does the patient have difficulty seeing, even when wearing glasses/contacts?: Yes Does the patient have difficulty concentrating, remembering, or making decisions?: No Patient able to express need for assistance with ADLs?: Yes Does the patient have difficulty dressing or bathing?: Yes Independently performs ADLs?: Yes (appropriate for developmental age) Does the patient have difficulty walking or climbing stairs?: Yes Weakness of Legs: Both Weakness of Arms/Hands: None  Permission Sought/Granted Permission sought to share information with : Family Supports Permission granted to share information with : Yes, Verbal Permission Granted  Share Information with NAME: Baird Kay     Permission granted to share info w Relationship: niece  Permission granted to share info w Contact Information: [phone]  Emotional Assessment Appearance:: Appears stated age Attitude/Demeanor/Rapport: Engaged Affect (typically observed): Accepting Orientation: : Oriented to Self, Oriented to Place, Oriented to  Time, Oriented to Situation Alcohol / Substance Use: Not Applicable Psych Involvement: No (comment)  Admission diagnosis:  GI bleed [K92.2] Gastrointestinal hemorrhage with melena [K92.1] Patient Active Problem List   Diagnosis Date Noted   Anemia due to GI blood loss    Hematochezia    GI bleed 12/28/2021   Acute on chronic anemia 12/28/2021   Hyperkalemia 12/28/2021   Leukocytosis 12/28/2021  Acute upper GI bleed 12/28/2021   Generalized weakness 08/02/2021   Hypothyroidism    Hypertension    Elevated troponin    ESRD on dialysis ([place]) 08/24/2016   Monoclonal gammopathy  10/06/2015   Gammopathy, monoclonal    Polyclonal gammopathy determined by serum protein electrophoresis 10/04/2015   Depression 03/29/2015   Intertrigo 05/30/2014   Proteinuria 05/05/2014   PCP:  Prince Solian, MD Pharmacy:   [facility] [place], [address] [place] [address] Phone: [phone] Fax: [phone]  [facility] [place], [place] [address] [place] [address] Phone: [phone] Fax: [phone]  [facility] [place], [place] [address] Phone: [phone] Fax: [phone]     Social Determinants of Health (SDOH) Interventions    Readmission Risk Interventions No flowsheet data found.

## 2021-12-30 NOTE — Progress Notes (Signed)
[facility] Progress Note   Subjective: HGB ^ 10 post 2 units of PRBCs. Seen by GI, will manage conservatively.    Objective Vitals:   12/29/21 1234 12/29/21 2112 12/30/21 0500 12/30/21 0730  BP: (!) 106/40 (!) 126/57 (!) 107/51 (!) 126/52  Pulse: 70 69 70 69  Resp: 19 18  18   Temp: 98.5 F (36.9 C) 98.8 F (37.1 C)  98.6 F (37 C)  TempSrc: Oral Oral  Oral  SpO2:  93% 97% 96%  Weight:   63 kg   Height:       General: Frail appearing very elderly male in NAD Lungs: Clear bilaterally to auscultation without wheezes, rales, or rhonchi. Breathing is unlabored. Heart: irregular  with S1 S2 3/6 systolic M. AFib on monitor. Rate controlled.  Abdomen: Soft, non-tender, non-distended with normoactive bowel sounds. No rebound/guarding. No obvious abdominal masses. Lower extremities:without edema or ischemic changes, no open wounds  Neuro: Alert and oriented X 3. Moves all extremities spontaneously. Dialysis Access: L AVF +T/B   Additional Objective Labs: Basic Metabolic Panel: Recent Labs  Lab 12/28/21 1041 12/28/21 2019 12/29/21 0050 12/29/21 0317 12/29/21 1817 12/29/21 2202 12/30/21 0223  NA 142  --  140  --   --   --   --   K 5.5*   < > 6.5*   < > 4.1 4.1 4.3  CL 98  --  100  --   --   --   --   CO2 28  --  27  --   --   --   --   GLUCOSE 96  --  87  --   --   --   --   BUN 123*  --  135*  --   --   --   --   CREATININE 5.84*  --  6.45*  --   --   --   --   CALCIUM 9.3  --  8.8*  --   --   --   --   PHOS  --   --  5.5*  --   --   --   --    < > = values in this interval not displayed.   Liver Function Tests: Recent Labs  Lab 12/28/21 1041 12/29/21 0050  AST 24 21  ALT 13 15  ALKPHOS 133* 119  BILITOT 0.5 0.8  PROT 7.2 6.4*  ALBUMIN 3.5 2.8*   No results for input(s): LIPASE, AMYLASE in the last 168 hours. CBC: Recent Labs  Lab 12/28/21 1041 12/28/21 2002 12/29/21 0050 12/29/21 1458 12/30/21 0707  WBC 11.2*  --  9.2  --  6.8  HGB  9.0*   < > 7.5* 9.4* 10.0*  HCT 28.9*   < > 24.0* 29.0* 30.2*  MCV 105.1*  --  104.3*  --  99.3  PLT 265  --  235  --  210   < > = values in this interval not displayed.   Blood Culture No results found for: SDES, SPECREQUEST, CULT, REPTSTATUS  Cardiac Enzymes: No results for input(s): CKTOTAL, CKMB, CKMBINDEX, TROPONINI in the last 168 hours. CBG: No results for input(s): GLUCAP in the last 168 hours. Iron Studies:  Recent Labs    12/28/21 2002  IRON 63  TIBC 206*  FERRITIN 1,083*   @lablastinr3 @ Studies/Results: CT ANGIO GI BLEED  Result Date: 12/28/2021 CLINICAL DATA:  Evaluate for mesenteric ischemia and diverticular bleeding. EXAM: CTA ABDOMEN AND PELVIS WITHOUT AND WITH  CONTRAST TECHNIQUE: Multidetector CT imaging of the abdomen and pelvis was performed using the standard protocol during bolus administration of intravenous contrast. Multiplanar reconstructed images and MIPs were obtained and reviewed to evaluate the vascular anatomy. RADIATION DOSE REDUCTION: This exam was performed according to the departmental dose-optimization program which includes automated exposure control, adjustment of the mA and/or kV according to patient size and/or use of iterative reconstruction technique. CONTRAST:  12mL OMNIPAQUE IOHEXOL 350 MG/ML SOLN COMPARISON:  None. FINDINGS: VASCULAR Aorta: Diffuse atherosclerotic calcifications in the abdominal aorta without aneurysm, dissection or significant stenosis. Aorta at the level celiac trunk measures 2.8 cm. Celiac: Mild-to-moderate stenosis at the origin of the celiac trunk due to calcified plaque. There is ectasia or poststenotic dilatation of the celiac trunk measuring up to 1.1 cm. Splenic artery is heavily calcified. Common hepatic artery is patent. SMA: Calcified plaque at the origin of the SMA without significant stenosis. Areas of mild narrowing in the main SMA. Diffuse atherosclerotic disease in the SMA branches. No evidence for acute thrombus.  Renals: Atherosclerotic disease involving bilateral renal arteries. Prominent noncalcified plaque in the mid left renal artery could be causing moderate to severe stenosis in this area. Early branching of the right renal artery with areas of stenosis and atherosclerotic disease beyond the branch point. No evidence for aneurysm or dissections involving the renal arteries. IMA: Patent Inflow: Common, internal and external iliac arteries are patent bilaterally with diffuse atherosclerotic calcifications. Mild stenosis near the origin of the left internal iliac artery. Proximal Outflow: Proximal femoral arteries are patent bilaterally. Veins: Normal appearance the IVC and iliac veins. Main portal venous system is patent. Review of the MIP images confirms the above findings. NON-VASCULAR Lower chest: Bilateral pleural effusions. Pleural effusions are small in size. Compressive atelectasis or consolidation in the right lower lobe. Pleural-based densities in the right middle lobe are nonspecific. 4 mm nodule in the left lower lobe on sequence 17 image 17 is probably calcified and suggestive for a calcified granuloma. Suspect underlying emphysema. Enhancement or mild pleural thickening associated with these pleural effusions. Hepatobiliary: Normal appearance of the liver. Mild gallbladder distension without inflammatory changes. Pancreas: Round low-density structure at the pancreatic head on sequence 7 image 72 measures 1.2 cm. This is suggestive for a small cystic lesion. No evidence for pancreatic duct dilatation or pancreatic inflammation. Spleen: Normal in size without focal abnormality. Adrenals/Urinary Tract: Adrenal glands are within the normal limits. Both kidneys are very atrophic. Multiple bilateral renal cysts. Negative for left hydronephrosis. There is fullness of the right renal pelvis without significant right caliceal dilatation. Ureters are not dilated. Bladder is decompressed but there is a diffuse bladder  wall thickening which could be associated with under distension. Stomach/Bowel: Diverticula in the sigmoid colon without acute inflammation. No evidence for active GI bleeding. Normal appearance of the stomach. No bowel distention or obstruction. Lymphatic: No lymph node enlargement in the abdomen or pelvis. Reproductive: Prostate is unremarkable. Other: Negative for ascites or free air. Mild mesenteric stranding is nonspecific. Ventral hernia near the midline containing fat. Mild subcutaneous edema. Musculoskeletal: Multilevel disc space narrowing in lumbar spine. Mild anterolisthesis of L4 on L5. IMPRESSION: VASCULAR 1. No evidence for active GI bleeding. 2. Diffuse atherosclerotic disease in the abdomen and pelvis. Stenosis at the origin of the celiac trunk and bilateral renal arteries. 3.  Aortic Atherosclerosis (ICD10-I70.0). NON-VASCULAR 1. Colonic diverticulosis without acute bowel inflammation. 2. Bilateral pleural effusions with mild pleural thickening bilaterally. Extensive volume loss in the right lower lobe  of uncertain etiology or chronicity. In addition, there is irregular pleural-based thickening along the right middle lobe. These pleural effusions are small in size. There may be underlying emphysema. These chest findings could be better evaluated with a dedicated chest CT. 3. Bilateral renal atrophy with bilateral renal cysts. Dilatation of the right renal pelvis and suspect this is a chronic finding. 4. 1.2 cm low-density structure near the pancreatic head. This is suggestive for a small pancreatic cystic lesion. Consider follow-up imaging in 2 years. Electronically Signed   By: Markus Daft M.D.   On: 12/28/2021 13:41   Medications:  ferric gluconate (FERRLECIT) IVPB 125 mg (12/29/21 1319)    Chlorhexidine Gluconate Cloth  6 each Topical Q0600   pantoprazole (PROTONIX) IV  40 mg Intravenous Q12H     Dialysis Orders: [facility] M[facility] 3.5 hrs 180NRe 400/800 2.0 K/2.0 Ca AVF -No  heparin -Mircera 30 mcg IV q 4 week (last dose 12/13/2021) -Venofer 100 mg IV X 5 doses (Not started yet) -Calcitril 0.25 mcg PO TIW     Assessment/Plan:  Acute GIB: HGB 10 after 2 units of PRBCS 12/29/2021. GI Consulted. Will manage conservatively. Per primary Hyperkalemia: K+ 6.5 on admission. Resolved with HD.   ESRD - MWF. Next HD 01/01/2022.   Hypertension/volume  - Appears euvolemic by exam but developed hypotension early into HD 12/29/2021 Net UF +31cc. BP still soft. UF as possible.  No antihypertensive meds on OP Med list.   Anemia  - See # 1. Will continue OP Fe load here. Follow HGB.   Metabolic bone disease - PO4 2.7 here. Decrease binder dose. Continue VDRA.   Nutrition - Clear liquids today. Renal diet with fld restrictions when eating HFrEF-no evidence of volume overload by exam but has bilateral pleural effusion. Will address lowering EDW if possible when he is more hemodynamically stable.  Afib-rate controlled, thankfully not on anticoagulation.  COPD-per primary  Jimmye Norman. Deandrea Rion NP-C 12/30/2021, 11:09 AM  [employer] [phone]

## 2021-12-30 NOTE — Plan of Care (Signed)

## 2021-12-31 LAB — CBC
HCT: 31.6 % — ABNORMAL LOW (ref 39.0–52.0)
Hemoglobin: 10.6 g/dL — ABNORMAL LOW (ref 13.0–17.0)
MCH: 33.3 pg (ref 26.0–34.0)
MCHC: 33.5 g/dL (ref 30.0–36.0)
MCV: 99.4 fL (ref 80.0–100.0)
Platelets: 234 10*3/uL (ref 150–400)
RBC: 3.18 MIL/uL — ABNORMAL LOW (ref 4.22–5.81)
RDW: 15.9 % — ABNORMAL HIGH (ref 11.5–15.5)
WBC: 7.6 10*3/uL (ref 4.0–10.5)
nRBC: 0.3 % — ABNORMAL HIGH (ref 0.0–0.2)

## 2021-12-31 MED ORDER — CALCITRIOL 0.25 MCG PO CAPS
0.2500 ug | ORAL_CAPSULE | ORAL | Status: DC
  Administered 2022-01-01: 0.25 ug via ORAL
  Filled 2021-12-31 (×2): qty 1

## 2021-12-31 NOTE — Progress Notes (Signed)
[facility] Progress Note   Subjective: No issues reported overnight. HGB 10.6 this AM. HD tomorrow on schedule.   Objective Vitals:   12/30/21 0730 12/30/21 1413 12/30/21 2103 12/31/21 0853  BP: (!) 126/52 115/63 (!) 124/56 123/63  Pulse: 69 69 69   Resp: 18 19 18 17   Temp: 98.6 F (37 C) 98.4 F (36.9 C) 97.6 F (36.4 C)   TempSrc: Oral Oral Oral   SpO2: 96% 100% 100%   Weight:      Height:       General: Frail appearing very elderly male in NAD Lungs: Clear bilaterally to auscultation without wheezes, rales, or rhonchi. Breathing is unlabored. Heart: irregular  with S1 S2 3/6 systolic M. AFib on monitor. Rate controlled.  Abdomen: Soft, non-tender, non-distended with normoactive bowel sounds. No rebound/guarding. No obvious abdominal masses. Lower extremities:without edema or ischemic changes, no open wounds  Neuro: Alert and oriented X 3. Moves all extremities spontaneously. Dialysis Access: L AVF +T/B  Dialysis Orders:  Additional Objective Labs: Basic Metabolic Panel: Recent Labs  Lab 12/28/21 1041 12/28/21 2019 12/29/21 0050 12/29/21 0317 12/29/21 1817 12/29/21 2202 12/30/21 0223  NA 142  --  140  --   --   --   --   K 5.5*   < > 6.5*   < > 4.1 4.1 4.3  CL 98  --  100  --   --   --   --   CO2 28  --  27  --   --   --   --   GLUCOSE 96  --  87  --   --   --   --   BUN 123*  --  135*  --   --   --   --   CREATININE 5.84*  --  6.45*  --   --   --   --   CALCIUM 9.3  --  8.8*  --   --   --   --   PHOS  --   --  5.5*  --   --   --   --    < > = values in this interval not displayed.   Liver Function Tests: Recent Labs  Lab 12/28/21 1041 12/29/21 0050  AST 24 21  ALT 13 15  ALKPHOS 133* 119  BILITOT 0.5 0.8  PROT 7.2 6.4*  ALBUMIN 3.5 2.8*   No results for input(s): LIPASE, AMYLASE in the last 168 hours. CBC: Recent Labs  Lab 12/28/21 1041 12/28/21 2002 12/29/21 0050 12/29/21 1458 12/30/21 0707 12/31/21 0626  WBC 11.2*  --   9.2  --  6.8 7.6  HGB 9.0*   < > 7.5* 9.4* 10.0* 10.6*  HCT 28.9*   < > 24.0* 29.0* 30.2* 31.6*  MCV 105.1*  --  104.3*  --  99.3 99.4  PLT 265  --  235  --  210 234   < > = values in this interval not displayed.   Blood Culture No results found for: SDES, SPECREQUEST, CULT, REPTSTATUS  Cardiac Enzymes: No results for input(s): CKTOTAL, CKMB, CKMBINDEX, TROPONINI in the last 168 hours. CBG: No results for input(s): GLUCAP in the last 168 hours. Iron Studies:  Recent Labs    12/28/21 2002  IRON 63  TIBC 206*  FERRITIN 1,083*   @lablastinr3 @ Studies/Results: No results found. Medications:  ferric gluconate (FERRLECIT) IVPB 125 mg (12/29/21 1319)    calcium acetate  667 mg Oral TID  WC   Chlorhexidine Gluconate Cloth  6 each Topical Q0600   donepezil  10 mg Oral QHS   levothyroxine  112 mcg Oral q morning   loratadine  10 mg Oral Daily   mirtazapine  15 mg Oral QHS   multivitamin  1 tablet Oral QHS   pantoprazole (PROTONIX) IV  40 mg Intravenous Q12H   sevelamer carbonate  800 mg Oral TID WC   vitamin B-12  50 mcg Oral Daily     Dialysis Orders: [facility] MWF 3.5 hrs 180NRe 400/800 2.0 K/2.0 Ca AVF -No heparin -Mircera 30 mcg IV q 4 week (last dose 12/13/2021) -Venofer 100 mg IV X 5 doses (Not started yet) -Calcitril 0.25 mcg PO TIW     Assessment/Plan:  Acute GIB: HGB 10 after 2 units of PRBCS 12/29/2021. GI Consulted. Will manage conservatively. Per primary Hyperkalemia: K+ 6.5 on admission. Resolved with HD.   ESRD - MWF. Next HD 01/01/2022.   Hypertension/volume  - Appears euvolemic by exam but developed hypotension early into HD 12/29/2021 Net UF +31cc. BP still soft. UF as possible.  No antihypertensive meds on OP Med list.   Anemia  - See # 1. Will continue OP Fe load here. Follow HGB.   Metabolic bone disease - PO4 2.7 here. Decrease binder dose. Continue VDRA.   Nutrition -Regular diet with fld restrictions when eating. Watch K+! HFrEF-no  evidence of volume overload by exam but has bilateral pleural effusion. Will address lowering EDW if possible when he is more hemodynamically stable.  Afib-rate controlled, thankfully not on anticoagulation.  COPD-per primary    Jimmye Norman. Veona Bittman NP-C 12/31/2021, 11:17 AM  [employer] [phone]

## 2021-12-31 NOTE — Plan of Care (Signed)
  Problem: Activity: Goal: Risk for activity intolerance will decrease Outcome: Progressing   Problem: Nutrition: Goal: Adequate nutrition will be maintained Outcome: Progressing   Problem: Coping: Goal: Level of anxiety will decrease Outcome: Progressing   

## 2021-12-31 NOTE — Progress Notes (Signed)
PROGRESS NOTE  Lonnie [name] OBS:[id number] DOB: 1925-04-19 DOA: 12/28/2021 PCP: Prince Solian, MD  HPI/Recap of past 24 hours:  Lonnie [name] is a 88 y.o. male with medical history significant for end-stage renal disease on hemodialysis on Monday, Wednesday, Friday, chronic anemia with baseline hemoglobin 10-11, GERD, acquired hypothyroidism, chronic systolic/diastolic heart failure, chronic hypoxic respiratory failure on 2 to 2.5 L continuous nasal cannula, who is admitted to [facility] on 12/28/2021 by way of transfer from [facility] emergency department with suspected acute upper gastrointestinal bleed after presenting from home to [facility] ED complaining of maroon-colored stool.  Work-up revealed symptomatic anemia with hemoglobin of 7.5 with positive FOBT.  He received 2 units PRBC on admission.  GI and nephrology consulted.  HD on 12/29/2021.  GI will manage his GI bleed conservatively, continue IV Protonix twice daily for now as recommended by GI.  12/31/2021: Seen at bedside>  He has no new complaints.  Planned HD tomorrow.  Assessment/Plan: Principal Problem:   Acute upper GI bleed Active Problems:   Depression   ESRD on dialysis ([place])   Hypothyroidism   GI bleed   Acute on chronic anemia   Hyperkalemia   Leukocytosis   Hematochezia   Anemia due to GI blood loss  GI bleed, upper or lower. Presented with maroon-colored stool. Symptomatic anemia hemoglobin 7.5 with positive FOBT. Started on IV Protonix 40 mg twice daily. Received 2 unit PRBC transfusion for hemoglobin of 7.5 Maintain MAP greater than 65 Seen by GI, plan to manage conservatively. Continue to monitor for now. Hemoglobin stable 10.6 from 10.0 from 9.4, uptrending.  Resolved acute blood loss anemia in the setting of GI bleed, suspected diverticular bleed. Post PRBC transfusion as stated above H&H is now stable, no reported recurrent GI bleed Continue to monitor H&H  ESRD on HD MWF History  completed on 12/29/2021. Patient nephrology's assistance. HD tomorrow  Resolved Hyperkalemia Serum potassium 6.5> 4.3 Repeat potassium level after hemodialysis.  Chronic hypoxic respiratory failure. On 2 L nasal cannula continuously at baseline. Maintain O2 saturation greater than 90%.  Physical debility PT OT to assess, pending assessment. At baseline he ambulates with a walker Fall precautions  Anemia of chronic disease in the setting of ESRD and iron deficiency Continue home iron supplementation Obtain iron studies in the morning.   Code Status: DNR  Family Communication: None at bedside  Disposition Plan: Likely will discharge to home once GI and nephrology sign off.   Consultants: GI Nephrology  Procedures: None  Antimicrobials: None  DVT prophylaxis: SCDs  Status is: Inpatient  Patient requires at least 2 midnights for further evaluation and treatment of present condition.      Objective: Vitals:   12/30/21 1413 12/30/21 2103 12/31/21 0853 12/31/21 1253  BP: 115/63 (!) 124/56 123/63 118/60  Pulse: 69 69    Resp: 19 18 17 18   Temp: 98.4 F (36.9 C) 97.6 F (36.4 C)    TempSrc: Oral Oral    SpO2: 100% 100%  100%  Weight:      Height:        Intake/Output Summary (Last 24 hours) at 12/31/2021 1410 Last data filed at 12/31/2021 0659 Gross per 24 hour  Intake --  Output 50 ml  Net -50 ml   Filed Weights   12/28/21 1038 12/29/21 0841 12/30/21 0500  Weight: 64.9 kg 57.4 kg 63 kg    Exam:  General: 88 y.o. year-old male Frail no acute distress. A&O x3 Cardiovascular: RRR no  rubs or gallops. Respiratory: CTA no wheezes or rales  Abdomen: Soft NT ND Musculoskeletal: No LE edema Skin: No ulcerative lesions Psychiatry: Mood is appropriate Neuro: Moves all 4 extremities nonfocal exam   Data Reviewed: CBC: Recent Labs  Lab 12/28/21 1041 12/28/21 2002 12/29/21 0050 12/29/21 1458 12/30/21 0707 12/31/21 0626  WBC 11.2*  --  9.2  --   6.8 7.6  HGB 9.0* 8.2* 7.5* 9.4* 10.0* 10.6*  HCT 28.9* 25.4* 24.0* 29.0* 30.2* 31.6*  MCV 105.1*  --  104.3*  --  99.3 99.4  PLT 265  --  235  --  210 308   Basic Metabolic Panel: Recent Labs  Lab 12/28/21 1041 12/28/21 2019 12/29/21 0050 12/29/21 0317 12/29/21 1458 12/29/21 1817 12/29/21 2202 12/30/21 0223  NA 142  --  140  --   --   --   --   --   K 5.5*   < > 6.5* 6.0* 3.9 4.1 4.1 4.3  CL 98  --  100  --   --   --   --   --   CO2 28  --  27  --   --   --   --   --   GLUCOSE 96  --  87  --   --   --   --   --   BUN 123*  --  135*  --   --   --   --   --   CREATININE 5.84*  --  6.45*  --   --   --   --   --   CALCIUM 9.3  --  8.8*  --   --   --   --   --   MG  --   --  2.7*  --   --   --   --   --   PHOS  --   --  5.5*  --   --   --   --   --    < > = values in this interval not displayed.   GFR: Estimated Creatinine Clearance: 6.2 mL/min (A) (by C-G formula based on SCr of 6.45 mg/dL (H)). Liver Function Tests: Recent Labs  Lab 12/28/21 1041 12/29/21 0050  AST 24 21  ALT 13 15  ALKPHOS 133* 119  BILITOT 0.5 0.8  PROT 7.2 6.4*  ALBUMIN 3.5 2.8*   No results for input(s): LIPASE, AMYLASE in the last 168 hours. No results for input(s): AMMONIA in the last 168 hours. Coagulation Profile: Recent Labs  Lab 12/28/21 1041 12/29/21 0050  INR 1.1 1.2   Cardiac Enzymes: No results for input(s): CKTOTAL, CKMB, CKMBINDEX, TROPONINI in the last 168 hours. BNP (last 3 results) No results for input(s): PROBNP in the last 8760 hours. HbA1C: No results for input(s): HGBA1C in the last 72 hours. CBG: No results for input(s): GLUCAP in the last 168 hours. Lipid Profile: No results for input(s): CHOL, HDL, LDLCALC, TRIG, CHOLHDL, LDLDIRECT in the last 72 hours. Thyroid Function Tests: No results for input(s): TSH, T4TOTAL, FREET4, T3FREE, THYROIDAB in the last 72 hours. Anemia Panel: Recent Labs    12/28/21 2002 12/28/21 2019  FOLATE  --  42.9  FERRITIN 1,083*  --    TIBC 206*  --   IRON 63  --    Urine analysis:    Component Value Date/Time   COLORURINE YELLOW 03/19/2016 1827   APPEARANCEUR CLEAR 03/19/2016 1827   LABSPEC 1.020 11/02/2020 1826  PHURINE 8.5 (H) 11/02/2020 1826   GLUCOSEU NEGATIVE 11/02/2020 1826   HGBUR TRACE (A) 11/02/2020 1826   BILIRUBINUR NEGATIVE 11/02/2020 1826   KETONESUR NEGATIVE 11/02/2020 1826   PROTEINUR >=300 (A) 11/02/2020 1826   UROBILINOGEN 0.2 11/02/2020 1826   NITRITE NEGATIVE 11/02/2020 1826   LEUKOCYTESUR NEGATIVE 11/02/2020 1826   Sepsis Labs: @LABRCNTIP (procalcitonin:4,lacticidven:4)  ) Recent Results (from the past 240 hour(s))  Resp Panel by RT-PCR (Flu A&B, Covid) Nasopharyngeal Swab     Status: None   Collection Time: 12/28/21  4:23 PM   Specimen: Nasopharyngeal Swab; Nasopharyngeal(NP) swabs in vial transport medium  Result Value Ref Range Status   SARS Coronavirus 2 by RT PCR NEGATIVE NEGATIVE Final    Comment: (NOTE) SARS-CoV-2 target nucleic acids are NOT DETECTED.  The SARS-CoV-2 RNA is generally detectable in upper respiratory specimens during the acute phase of infection. The lowest concentration of SARS-CoV-2 viral copies this assay can detect is 138 copies/mL. A negative result does not preclude SARS-Cov-2 infection and should not be used as the sole basis for treatment or other patient management decisions. A negative result may occur with  improper specimen collection/handling, submission of specimen other than nasopharyngeal swab, presence of viral mutation(s) within the areas targeted by this assay, and inadequate number of viral copies(<138 copies/mL). A negative result must be combined with clinical observations, patient history, and epidemiological information. The expected result is Negative.  Fact Sheet for Patients:  EntrepreneurPulse.com.au  Fact Sheet for Healthcare Providers:  IncredibleEmployment.be  This test is no t yet  approved or cleared by the [place] FDA and  has been authorized for detection and/or diagnosis of SARS-CoV-2 by FDA under an Emergency Use Authorization (EUA). This EUA will remain  in effect (meaning this test can be used) for the duration of the COVID-19 declaration under Section 564(b)(1) of the Act, 21 U.S.C.section 360bbb-3(b)(1), unless the authorization is terminated  or revoked sooner.       Influenza A by PCR NEGATIVE NEGATIVE Final   Influenza B by PCR NEGATIVE NEGATIVE Final    Comment: (NOTE) The Xpert Xpress SARS-CoV-2/FLU/RSV plus assay is intended as an aid in the diagnosis of influenza from Nasopharyngeal swab specimens and should not be used as a sole basis for treatment. Nasal washings and aspirates are unacceptable for Xpert Xpress SARS-CoV-2/FLU/RSV testing.  Fact Sheet for Patients: EntrepreneurPulse.com.au  Fact Sheet for Healthcare Providers: IncredibleEmployment.be  This test is not yet approved or cleared by the [place] FDA and has been authorized for detection and/or diagnosis of SARS-CoV-2 by FDA under an Emergency Use Authorization (EUA). This EUA will remain in effect (meaning this test can be used) for the duration of the COVID-19 declaration under Section 564(b)(1) of the Act, 21 U.S.C. section 360bbb-3(b)(1), unless the authorization is terminated or revoked.  Performed at [facility], [address], [place], [place] [address]       Studies: No results found.  Scheduled Meds:  [START ON 01/01/2022] calcitRIOL  0.25 mcg Oral Q M,W,F-HD   calcium acetate  667 mg Oral TID WC   Chlorhexidine Gluconate Cloth  6 each Topical Q0600   donepezil  10 mg Oral QHS   levothyroxine  112 mcg Oral q morning   loratadine  10 mg Oral Daily   mirtazapine  15 mg Oral QHS   multivitamin  1 tablet Oral QHS   pantoprazole (PROTONIX) IV  40 mg Intravenous Q12H   sevelamer carbonate  800 mg  Oral TID WC  vitamin B-12  50 mcg Oral Daily    Continuous Infusions:  ferric gluconate (FERRLECIT) IVPB 125 mg (12/29/21 1319)     LOS: 3 days     Kayleen Memos, MD [facility] Pager [phone]  If 7PM-7AM, please contact night-coverage [email or web address] Password [email or web address] 12/31/2021, 2:10 PM

## 2022-01-01 LAB — METHYLMALONIC ACID, SERUM: Methylmalonic Acid, Quantitative: 987 nmol/L — ABNORMAL HIGH (ref 0–378)

## 2022-01-01 MED ORDER — PANTOPRAZOLE SODIUM 20 MG PO TBEC: 20.0000 mg | DELAYED_RELEASE_TABLET | Freq: Every day | ORAL | 0 refills | Status: AC

## 2022-01-01 MED ORDER — LEVOTHYROXINE SODIUM 112 MCG PO TABS: 112.0000 ug | ORAL_TABLET | Freq: Every morning | ORAL | 0 refills | Status: AC

## 2022-01-01 NOTE — Progress Notes (Signed)
[name] Progress Note   Subjective:   Eating breakfast. No new concerns. Denies SOB, CP, palpitations, abdominal pain and nausea. On O2 3L- appears baseline is 2L.   Objective Vitals:   12/31/21 1607 12/31/21 2240 01/01/22 0402 01/01/22 0735  BP: 116/61 119/62 (!) 112/51 (!) 124/54  Pulse: 69 70 69 68  Resp: 18 20 20 19   Temp: (!) 97.5 F (36.4 C) 98.3 F (36.8 C) 97.9 F (36.6 C) 97.6 F (36.4 C)  TempSrc: Oral Oral Oral Oral  SpO2: 100% 100% 98% 100%  Weight:      Height:       Physical Exam General: Elderly male, alert and in NAD Heart: RRR, 3/6 systolic murmur Lungs: CTA bilaterally without wheezing, rhonchi or rales Abdomen: Soft, non-distended, +BS Extremities: No edema b/l lower extremities Dialysis Access:  LUE AVF + thrill and bruit  Additional Objective Labs: Basic Metabolic Panel: Recent Labs  Lab 12/28/21 1041 12/28/21 2019 12/29/21 0050 12/29/21 0317 12/29/21 1817 12/29/21 2202 12/30/21 0223  NA 142  --  140  --   --   --   --   K 5.5*   < > 6.5*   < > 4.1 4.1 4.3  CL 98  --  100  --   --   --   --   CO2 28  --  27  --   --   --   --   GLUCOSE 96  --  87  --   --   --   --   BUN 123*  --  135*  --   --   --   --   CREATININE 5.84*  --  6.45*  --   --   --   --   CALCIUM 9.3  --  8.8*  --   --   --   --   PHOS  --   --  5.5*  --   --   --   --    < > = values in this interval not displayed.   Liver Function Tests: Recent Labs  Lab 12/28/21 1041 12/29/21 0050  AST 24 21  ALT 13 15  ALKPHOS 133* 119  BILITOT 0.5 0.8  PROT 7.2 6.4*  ALBUMIN 3.5 2.8*   No results for input(s): LIPASE, AMYLASE in the last 168 hours. CBC: Recent Labs  Lab 12/28/21 1041 12/28/21 2002 12/29/21 0050 12/29/21 1458 12/30/21 0707 12/31/21 0626  WBC 11.2*  --  9.2  --  6.8 7.6  HGB 9.0*   < > 7.5* 9.4* 10.0* 10.6*  HCT 28.9*   < > 24.0* 29.0* 30.2* 31.6*  MCV 105.1*  --  104.3*  --  99.3 99.4  PLT 265  --  235  --  210 234   < > =  values in this interval not displayed.    Medications:  ferric gluconate (FERRLECIT) IVPB 125 mg (12/29/21 1319)    calcitRIOL  0.25 mcg Oral Q M,W,F-HD   calcium acetate  667 mg Oral TID WC   Chlorhexidine Gluconate Cloth  6 each Topical Q0600   donepezil  10 mg Oral QHS   levothyroxine  112 mcg Oral q morning   loratadine  10 mg Oral Daily   mirtazapine  15 mg Oral QHS   multivitamin  1 tablet Oral QHS   pantoprazole (PROTONIX) IV  40 mg Intravenous Q12H   sevelamer carbonate  800 mg Oral TID WC   vitamin B-12  50 mcg Oral Daily    Dialysis Orders: [facility]: [facility] MWF 3.5 hrs 180NRe 400/800 2.0 K/2.0 Ca AVF -No heparin -Mircera 30 mcg IV q 4 week (last dose 12/13/2021) -Venofer 100 mg IV X 5 doses (Not started yet) -Calcitril 0.25 mcg PO TIW  Assessment/Plan:  Acute GIB: HGB 9.0. S/p 2 units of PRBCS 12/29/2021. GI Consulted and planning conservative management.  Hyperkalemia: K+ 6.5 on admission. Resolved with HD. Needs updated labs, will check with HD today.   ESRD - MWF. Next HD 01/01/2022.   Hypertension/volume  - Appears euvolemic by exam but developed hypotension early into HD 12/29/2021 Net UF +31cc. BP still soft. UF as possible.  No antihypertensive meds on OP Med list.   Anemia  - See # 1. Will continue OP Fe load here. Follow HGB.   Metabolic bone disease - PO4 low on admission, binder dose decreased. Last calcium level within goal. Continue VDRA.   Nutrition -Regular diet with fluid restrictions. Will re-educate regarding low potassium diet.  HFrEF-no evidence of volume overload by exam but has bilateral pleural effusion. Not tolerating much UF with HD so far. Will continue to titrate volume down as tolerated.  Afib-rate controlled, thankfully not on anticoagulation.  COPD-per primary    Anice Paganini, PA-C 01/01/2022, 8:27 AM  [facility] Pager: [phone]

## 2022-01-01 NOTE — Progress Notes (Signed)
Pt seen in room alert/oriented in no apparent distress post HD tx. No complaints. VSS. Denies any pain/sob, and discomfort at this time.Per HD nurse removed 56ml,

## 2022-01-01 NOTE — Progress Notes (Signed)
PT Cancellation Note  Patient Details Name: Lonnie [name] MRN: [id number] DOB: 10/24/1925   Cancelled Treatment:    Reason Eval/Treat Not Completed: Patient declined, no reason specified (Pt refuses, states he has dialysis today.  PT offers to work with him on bed level therex or standing EOB while he waits for dialysis but states he does not want to do PT on his dialysis days.  Wants PT to f/u tomorrow.  No PT rendered at this time)  Nathan Moctezuma A. Emit Kuenzel, PT, DPT Acute Rehabilitation Services Office: [place] 01/01/2022, 10:16 AM

## 2022-01-01 NOTE — Discharge Summary (Addendum)
Discharge Summary  Lonnie [name] JQB:[id number] DOB: Aug 07, 1925  PCP: Prince Solian, MD  Admit date: 12/28/2021 Discharge date: 01/01/2022  Time spent: 35 minutes.  Recommendations for Outpatient Follow-up:  Follow-up with your primary care provider within a week. Keep your hemodialysis appointments. Follow-up with GI. Take your medications as prescribed. Continue PT OT with assistance and fall precautions.  Discharge Diagnoses:  Active Hospital Problems   Diagnosis Date Noted   Acute upper GI bleed 12/28/2021   Anemia due to GI blood loss    Hematochezia    GI bleed 12/28/2021   Acute on chronic anemia 12/28/2021   Hyperkalemia 12/28/2021   Leukocytosis 12/28/2021   Hypothyroidism    ESRD on dialysis [facility]) 08/24/2016   Depression 03/29/2015    Resolved Hospital Problems  No resolved problems to display.    Discharge Condition: Stable.  Diet recommendation: Resume previous diet.  Vitals:   01/01/22 1330 01/01/22 1430  BP: (!) 100/43 (!) 95/35  Pulse: (!) 53 90  Resp:    Temp:    SpO2:      History of present illness:   Lonnie [name] is a 88 y.o. male with medical history significant for end-stage renal disease on hemodialysis on Monday, Wednesday, Friday, chronic anemia with baseline hemoglobin 10-11, GERD, acquired hypothyroidism, chronic systolic/diastolic heart failure, chronic hypoxic respiratory failure on 2 to 2.5 L continuous nasal cannula, who is admitted to [facility] on 12/28/2021 by way of transfer from [facility] emergency department with suspected acute upper gastrointestinal bleed after presenting from home to [facility] ED complaining of maroon-colored stool.  Work-up revealed symptomatic anemia with hemoglobin of 7.5 with positive FOBT.  He received 2 units PRBC on admission.  GI and nephrology consulted.  HD on 12/29/2021 and 12/22/2021.  No recurrent GI bleed.  Hemoglobin stable.  Was managed conservatively.  No plan for endoscopy.    01/01/2022: Patient was seen at his bedside.  There were no acute events overnight.  He has no new complaints.    Hospital Course:  Principal Problem:   Acute upper GI bleed Active Problems:   Depression   ESRD on dialysis (HCC)   Hypothyroidism   GI bleed   Acute on chronic anemia   Hyperkalemia   Leukocytosis   Hematochezia   Anemia due to GI blood loss  Resolved GI bleed, upper or lower, suspected diverticular bleed by GI. Presented with maroon-colored stool, symptomatic anemia hemoglobin 7.5K with positive FOBT. Received IV Protonix 40 mg twice daily as recommended by GI. Received 2 unit PRBC transfusion for hemoglobin of 7.5K Seen by GI, managed conservatively, no plan for for endoscopy. Hemoglobin stable 10.6 from 10.0 from 9.4, uptrending. No recurrent GI bleed.  No overt bleeding. Resume home PPI, Protonix 20 mg daily.   Resolved acute blood loss anemia in the setting of GI bleed, suspected diverticular bleed. Post PRBC transfusion as stated above H&H is now stable, no reported recurrent GI bleed Continue to monitor H&H   ESRD on HD MWF HD completed on 12/29/2021 and 01/01/2019. Keep your outpatient hemodialysis appointments.   Resolved Hyperkalemia Serum potassium 6.5> 4.3   Chronic hypoxic respiratory failure. On 2 L nasal cannula continuously at baseline. Currently at his baseline oxygen requirement.   Physical debility PT OT recommended home health PT OT. Continue fall precautions   Anemia of chronic disease in the setting of ESRD and iron deficiency Continue home iron supplementation Managed by nephrology.  Chronic anxiety/depression Continue home Remeron Held home escitalopram due  to risk of bleeding Follow-up with your primary care provider within a week.     Code Status: DNR      Consultants: GI Nephrology   Procedures: None   Antimicrobials: None     Discharge Exam: BP (!) 95/35    Pulse 90    Temp 97.9 F (36.6 C) (Oral)     Resp 18    Ht 5\' 7"  (1.702 m)    Wt 59.9 kg    SpO2 100%    BMI 20.68 kg/m  General: 88 y.o. year-old male well developed well nourished in no acute distress.  Alert and oriented x3. Cardiovascular: Regular rate and rhythm with no rubs or gallops.  No thyromegaly or JVD noted.   Respiratory: Clear to auscultation with no wheezes or rales.  Poor inspiratory effort. Abdomen: Soft nontender nondistended with normal bowel sounds x4 quadrants. Musculoskeletal: No lower extremity edema bilaterally.   Skin: No ulcerative lesions noted or rashes, Psychiatry: Mood is appropriate for condition and setting  Discharge Instructions You were cared for by a hospitalist during your hospital stay. If you have any questions about your discharge medications or the care you received while you were in the hospital after you are discharged, you can call the unit and asked to speak with the hospitalist on call if the hospitalist that took care of you is not available. Once you are discharged, your primary care physician will handle any further medical issues. Please note that NO REFILLS for any discharge medications will be authorized once you are discharged, as it is imperative that you return to your primary care physician (or establish a relationship with a primary care physician if you do not have one) for your aftercare needs so that they can reassess your need for medications and monitor your lab values.   Allergies as of 01/01/2022   No Known Allergies      Medication List     STOP taking these medications    aspirin 81 MG chewable tablet   escitalopram 10 MG tablet Commonly known as: LEXAPRO   isosorbide mononitrate 30 MG 24 hr tablet Commonly known as: IMDUR   lidocaine-prilocaine cream Commonly known as: EMLA   predniSONE 20 MG tablet Commonly known as: DELTASONE       TAKE these medications    acetaminophen 325 MG tablet Commonly known as: Tylenol Take 2 tablets (650 mg total) by  mouth every 6 (six) hours as needed for mild pain.   calcium acetate 667 MG capsule Commonly known as: PHOSLO Take 1 capsule by mouth 3 (three) times daily with meals.   diclofenac Sodium 1 % Gel Commonly known as: Voltaren Apply 4 g topically 4 (four) times daily.   diphenhydramine-acetaminophen 25-500 MG Tabs tablet Commonly known as: TYLENOL PM Take 1 tablet by mouth at bedtime as needed (sleep).   donepezil 10 MG tablet Commonly known as: ARICEPT Take 10 mg by mouth every evening.   levothyroxine 112 MCG tablet Commonly known as: SYNTHROID Take 1 tablet (112 mcg total) by mouth every morning. What changed: Another medication with the same name was removed. Continue taking this medication, and follow the directions you see here.   loratadine 10 MG tablet Commonly known as: CLARITIN Take 10 mg by mouth daily.   mirtazapine 15 MG tablet Commonly known as: REMERON Take 15 mg by mouth at bedtime.   multivitamin Tabs tablet Take 1 tablet by mouth daily.   pantoprazole 20 MG tablet Commonly known as: PROTONIX Take  1 tablet (20 mg total) by mouth daily.   polyethylene glycol 17 g packet Commonly known as: MiraLax Take 17 g by mouth daily.   polyvinyl alcohol 1.4 % ophthalmic solution Commonly known as: LIQUIFILM TEARS Place 1-2 drops into both eyes as needed for dry eyes.   Renvela 800 MG tablet Generic drug: sevelamer carbonate Take 800 mg by mouth 3 (three) times daily.   traMADol 50 MG tablet Commonly known as: ULTRAM Take 50 mg by mouth every 12 (twelve) hours as needed.   VITAMIN B-12 PO Take 1 tablet by mouth daily.       No Known Allergies  Follow-up Information     Avva, Ravisankar, MD. Call today.   Specialty: Internal Medicine Why: Please call for a posthospital follow-up appointment. Contact information: [place] [address] [place], Cassie Freer, MD .   Specialties: Cardiology, Internal Medicine,  Radiology Contact information: [place] [address] [place] Follow up.   Why: the office will call to schedule home health visits Contact information: [place],  [place], [place] [address] Phone: [phone]                 The results of significant diagnostics from this hospitalization (including imaging, microbiology, ancillary and laboratory) are listed below for reference.    Significant Diagnostic Studies: CT ANGIO GI BLEED  Result Date: 12/28/2021 CLINICAL DATA:  Evaluate for mesenteric ischemia and diverticular bleeding. EXAM: CTA ABDOMEN AND PELVIS WITHOUT AND WITH CONTRAST TECHNIQUE: Multidetector CT imaging of the abdomen and pelvis was performed using the standard protocol during bolus administration of intravenous contrast. Multiplanar reconstructed images and MIPs were obtained and reviewed to evaluate the vascular anatomy. RADIATION DOSE REDUCTION: This exam was performed according to the departmental dose-optimization program which includes automated exposure control, adjustment of the mA and/or kV according to patient size and/or use of iterative reconstruction technique. CONTRAST:  58mL OMNIPAQUE IOHEXOL 350 MG/ML SOLN COMPARISON:  None. FINDINGS: VASCULAR Aorta: Diffuse atherosclerotic calcifications in the abdominal aorta without aneurysm, dissection or significant stenosis. Aorta at the level celiac trunk measures 2.8 cm. Celiac: Mild-to-moderate stenosis at the origin of the celiac trunk due to calcified plaque. There is ectasia or poststenotic dilatation of the celiac trunk measuring up to 1.1 cm. Splenic artery is heavily calcified. Common hepatic artery is patent. SMA: Calcified plaque at the origin of the SMA without significant stenosis. Areas of mild narrowing in the main SMA. Diffuse atherosclerotic disease in the SMA branches. No evidence for acute thrombus. Renals: Atherosclerotic disease involving  bilateral renal arteries. Prominent noncalcified plaque in the mid left renal artery could be causing moderate to severe stenosis in this area. Early branching of the right renal artery with areas of stenosis and atherosclerotic disease beyond the branch point. No evidence for aneurysm or dissections involving the renal arteries. IMA: Patent Inflow: Common, internal and external iliac arteries are patent bilaterally with diffuse atherosclerotic calcifications. Mild stenosis near the origin of the left internal iliac artery. Proximal Outflow: Proximal femoral arteries are patent bilaterally. Veins: Normal appearance the IVC and iliac veins. Main portal venous system is patent. Review of the MIP images confirms the above findings. NON-VASCULAR Lower chest: Bilateral pleural effusions. Pleural effusions are small in size. Compressive atelectasis or consolidation in the right lower lobe. Pleural-based densities in the right middle lobe are nonspecific. 4 mm  nodule in the left lower lobe on sequence 17 image 17 is probably calcified and suggestive for a calcified granuloma. Suspect underlying emphysema. Enhancement or mild pleural thickening associated with these pleural effusions. Hepatobiliary: Normal appearance of the liver. Mild gallbladder distension without inflammatory changes. Pancreas: Round low-density structure at the pancreatic head on sequence 7 image 72 measures 1.2 cm. This is suggestive for a small cystic lesion. No evidence for pancreatic duct dilatation or pancreatic inflammation. Spleen: Normal in size without focal abnormality. Adrenals/Urinary Tract: Adrenal glands are within the normal limits. Both kidneys are very atrophic. Multiple bilateral renal cysts. Negative for left hydronephrosis. There is fullness of the right renal pelvis without significant right caliceal dilatation. Ureters are not dilated. Bladder is decompressed but there is a diffuse bladder wall thickening which could be associated  with under distension. Stomach/Bowel: Diverticula in the sigmoid colon without acute inflammation. No evidence for active GI bleeding. Normal appearance of the stomach. No bowel distention or obstruction. Lymphatic: No lymph node enlargement in the abdomen or pelvis. Reproductive: Prostate is unremarkable. Other: Negative for ascites or free air. Mild mesenteric stranding is nonspecific. Ventral hernia near the midline containing fat. Mild subcutaneous edema. Musculoskeletal: Multilevel disc space narrowing in lumbar spine. Mild anterolisthesis of L4 on L5. IMPRESSION: VASCULAR 1. No evidence for active GI bleeding. 2. Diffuse atherosclerotic disease in the abdomen and pelvis. Stenosis at the origin of the celiac trunk and bilateral renal arteries. 3.  Aortic Atherosclerosis (ICD10-I70.0). NON-VASCULAR 1. Colonic diverticulosis without acute bowel inflammation. 2. Bilateral pleural effusions with mild pleural thickening bilaterally. Extensive volume loss in the right lower lobe of uncertain etiology or chronicity. In addition, there is irregular pleural-based thickening along the right middle lobe. These pleural effusions are small in size. There may be underlying emphysema. These chest findings could be better evaluated with a dedicated chest CT. 3. Bilateral renal atrophy with bilateral renal cysts. Dilatation of the right renal pelvis and suspect this is a chronic finding. 4. 1.2 cm low-density structure near the pancreatic head. This is suggestive for a small pancreatic cystic lesion. Consider follow-up imaging in 2 years. Electronically Signed   By: Markus Daft M.D.   On: 12/28/2021 13:41    Microbiology: Recent Results (from the past 240 hour(s))  Resp Panel by RT-PCR (Flu A&B, Covid) Nasopharyngeal Swab     Status: None   Collection Time: 12/28/21  4:23 PM   Specimen: Nasopharyngeal Swab; Nasopharyngeal(NP) swabs in vial transport medium  Result Value Ref Range Status   SARS Coronavirus 2 by RT PCR  NEGATIVE NEGATIVE Final    Comment: (NOTE) SARS-CoV-2 target nucleic acids are NOT DETECTED.  The SARS-CoV-2 RNA is generally detectable in upper respiratory specimens during the acute phase of infection. The lowest concentration of SARS-CoV-2 viral copies this assay can detect is 138 copies/mL. A negative result does not preclude SARS-Cov-2 infection and should not be used as the sole basis for treatment or other patient management decisions. A negative result may occur with  improper specimen collection/handling, submission of specimen other than nasopharyngeal swab, presence of viral mutation(s) within the areas targeted by this assay, and inadequate number of viral copies(<138 copies/mL). A negative result must be combined with clinical observations, patient history, and epidemiological information. The expected result is Negative.  Fact Sheet for Patients:  EntrepreneurPulse.com.au  Fact Sheet for Healthcare Providers:  IncredibleEmployment.be  This test is no t yet approved or cleared by the [place] FDA and  has been authorized for detection  and/or diagnosis of SARS-CoV-2 by FDA under an Emergency Use Authorization (EUA). This EUA will remain  in effect (meaning this test can be used) for the duration of the COVID-19 declaration under Section 564(b)(1) of the Act, 21 U.S.C.section 360bbb-3(b)(1), unless the authorization is terminated  or revoked sooner.       Influenza A by PCR NEGATIVE NEGATIVE Final   Influenza B by PCR NEGATIVE NEGATIVE Final    Comment: (NOTE) The Xpert Xpress SARS-CoV-2/FLU/RSV plus assay is intended as an aid in the diagnosis of influenza from Nasopharyngeal swab specimens and should not be used as a sole basis for treatment. Nasal washings and aspirates are unacceptable for Xpert Xpress SARS-CoV-2/FLU/RSV testing.  Fact Sheet for Patients: EntrepreneurPulse.com.au  Fact Sheet for  Healthcare Providers: IncredibleEmployment.be  This test is not yet approved or cleared by the [place] FDA and has been authorized for detection and/or diagnosis of SARS-CoV-2 by FDA under an Emergency Use Authorization (EUA). This EUA will remain in effect (meaning this test can be used) for the duration of the COVID-19 declaration under Section 564(b)(1) of the Act, 21 U.S.C. section 360bbb-3(b)(1), unless the authorization is terminated or revoked.  Performed at [facility], [address], [place], [place] [address]      Labs: Basic Metabolic Panel: Recent Labs  Lab 12/28/21 1041 12/28/21 2019 12/29/21 0050 12/29/21 0317 12/29/21 1458 12/29/21 1817 12/29/21 2202 12/30/21 0223  NA 142  --  140  --   --   --   --   --   K 5.5*   < > 6.5* 6.0* 3.9 4.1 4.1 4.3  CL 98  --  100  --   --   --   --   --   CO2 28  --  27  --   --   --   --   --   GLUCOSE 96  --  87  --   --   --   --   --   BUN 123*  --  135*  --   --   --   --   --   CREATININE 5.84*  --  6.45*  --   --   --   --   --   CALCIUM 9.3  --  8.8*  --   --   --   --   --   MG  --   --  2.7*  --   --   --   --   --   PHOS  --   --  5.5*  --   --   --   --   --    < > = values in this interval not displayed.   Liver Function Tests: Recent Labs  Lab 12/28/21 1041 12/29/21 0050  AST 24 21  ALT 13 15  ALKPHOS 133* 119  BILITOT 0.5 0.8  PROT 7.2 6.4*  ALBUMIN 3.5 2.8*   No results for input(s): LIPASE, AMYLASE in the last 168 hours. No results for input(s): AMMONIA in the last 168 hours. CBC: Recent Labs  Lab 12/28/21 1041 12/28/21 2002 12/29/21 0050 12/29/21 1458 12/30/21 0707 12/31/21 0626  WBC 11.2*  --  9.2  --  6.8 7.6  HGB 9.0* 8.2* 7.5* 9.4* 10.0* 10.6*  HCT 28.9* 25.4* 24.0* 29.0* 30.2* 31.6*  MCV 105.1*  --  104.3*  --  99.3 99.4  PLT 265  --  235  --  210 234  Cardiac Enzymes: No results for input(s): CKTOTAL, CKMB, CKMBINDEX, TROPONINI in  the last 168 hours. BNP: BNP (last 3 results) Recent Labs    08/02/21 1134  BNP >4,500.0*    ProBNP (last 3 results) No results for input(s): PROBNP in the last 8760 hours.  CBG: No results for input(s): GLUCAP in the last 168 hours.     Signed:  Kayleen Memos, MD [facility] 01/01/2022, 2:42 PM

## 2022-01-01 NOTE — Care Management Important Message (Signed)
Important Message  Patient Details  Name: Lonnie [name] MRN: [id number] Date of Birth: 1925/07/02   Medicare Important Message Given:  Yes     Hannah Beat 01/01/2022, 12:04 PM

## 2022-01-01 NOTE — Progress Notes (Signed)
Discharge summary packet provided to pt with instructions. Pt verbalized understanding of instructions. Pt d/c to home with [facility] services as ordered. Per pt his nephew will be responsible for his ride.and pt would like to eat his dinner prior to d/c.

## 2022-01-01 NOTE — TOC Transition Note (Signed)
Transition of Care [facility]) - CM/SW Discharge Note   Patient Details  Name: Lonnie [name] MRN: [id number] Date of Birth: 1925-06-19  [facility] CM/SW Contact:  Bartholomew Crews, RN Phone Number: [phone] 01/01/2022, 2:07 PM   Clinical Narrative:     Patient to transition home today following his hemodialysis treatment. [facility] accepted referral for PT/OT. Referral to [facility] because patient had previously received services, but d/t staffing would be a delayed start of care. Message left for niece requesting call back to inform of accepting home health agency. No further TOC needs identified.   Final next level of care: Home w Home Health Services Barriers to Discharge: No Barriers Identified   Patient Goals and CMS Choice Patient states their goals for this hospitalization and ongoing recovery are:: return home with nephews CMS Medicare.gov Compare Post Acute Care list provided to:: Patient Choice offered to / list presented to : Patient  Discharge Placement                       Discharge Plan and Services In-house Referral: NA Discharge Planning Services: CM Consult Post Acute Care Choice: Home Health          DME Arranged: N/A DME Agency: NA       HH Arranged: PT, OT [place] Agency: [place] (now known as [employer]) Date [place]: 01/01/22 Time [place]: 1406 Representative spoke with at [place]: [place] ([place]) Interventions     Readmission Risk Interventions No flowsheet data found.

## 2022-01-02 ENCOUNTER — Telehealth: Payer: Self-pay | Admitting: Nephrology

## 2022-01-02 NOTE — Telephone Encounter (Addendum)
Transition of care contact from inpatient facility  Date of discharge: 01/01/22 Date of contact: 01/02/22 Method: Phone Spoke to: Patient  Patient contacted to discuss transition of care from recent inpatient hospitalization. Patient was admitted to [facility] from .12/28/21 to01/30/23. with discharge diagnosis of Acute upper gi Bleed / hyperkalemia   Medication changes were reviewed.Noted Dc list with Calcium acetate and Renvela  as Phosphate binders and only needs Renvela which he was on at kidney center . Will leave note for care giver to stop Calcium acetate she was not available per phone today per Iona Beard , he has not question  fells better  Patient will follow up with his/her outpatient HD unit on: tomorrow 01/03/22 with written instructions on stopping calcium acetate binder  and continue Renvela as binder

## 2022-01-09 ENCOUNTER — Ambulatory Visit (HOSPITAL_COMMUNITY)
Admission: RE | Admit: 2022-01-09 | Discharge: 2022-01-09 | Disposition: A | Discharge status: Home / Self Care | Payer: Medicare Other | Source: Ambulatory Visit | Attending: Internal Medicine | Admitting: Internal Medicine

## 2022-01-09 ENCOUNTER — Other Ambulatory Visit: Payer: Self-pay

## 2022-01-09 DIAGNOSIS — R1310 Dysphagia, unspecified: Secondary | ICD-10-CM | POA: Diagnosis present

## 2022-01-09 NOTE — Therapy (Signed)
Modified Barium Swallow Progress Note  Patient Details  Name: Lonnie [name] MRN: [id number] Date of Birth: 10/30/1925  Today's Date: 01/09/2022  Modified Barium Swallow completed.  Full report located under Chart Review in the Imaging Section.  Brief recommendations include the following:  Clinical Impression  Patient presents with a mild oropharyngeal dysphagia and a moderate cervical esophageal dysphagia with appearance of prominent cricopharyngeal bar as well as suspected Zenker's Diverticulum above cricopharyngeal bar. With all tested consistencies (thin liquids, puree solids, regular solids) patient exhibited mild vallecular residuals, trace posterior pharyngeal wall residuals, mild-moderate retention of barium in diverticulum/outpouching and trace to mild residuals in pyriform sinus. Vallecular and pharyngeal residuals would clear with subsequent swallows. One instance of flash penetration (PAS 2) with cup sip of thin liquids but no instances of aspiration. View of diverticulum was obstructed by shoulders but during swallow, was able to be visualized better. There did appear to be some clearing of diverticulum but it remained with mild-moderate amount of barium contrast. SLP is recommending patient continue with current PO diet and follow precautions for GERD.   Swallow Evaluation Recommendations       SLP Diet Recommendations: Dysphagia 3 (Mech soft) solids;Thin liquid   Liquid Administration via: Cup;Straw   Medication Administration: Crushed with puree       Compensations: Follow solids with liquid;Small sips/bites              Sonia Baller, MA, CCC-SLP Speech Therapy

## 2022-01-15 ENCOUNTER — Emergency Department (HOSPITAL_COMMUNITY): Payer: Medicare Other

## 2022-01-15 ENCOUNTER — Inpatient Hospital Stay (HOSPITAL_COMMUNITY)
Admission: EM | Admit: 2022-01-15 | Discharge: 2022-01-17 | DRG: 177 | Disposition: A | Discharge status: Home Health | Payer: Medicare Other | Attending: Family Medicine | Admitting: Family Medicine

## 2022-01-15 DIAGNOSIS — Z992 Dependence on renal dialysis: Secondary | ICD-10-CM | POA: Diagnosis not present

## 2022-01-15 DIAGNOSIS — E039 Hypothyroidism, unspecified: Secondary | ICD-10-CM | POA: Diagnosis present

## 2022-01-15 DIAGNOSIS — N186 End stage renal disease: Secondary | ICD-10-CM

## 2022-01-15 DIAGNOSIS — K219 Gastro-esophageal reflux disease without esophagitis: Secondary | ICD-10-CM | POA: Diagnosis present

## 2022-01-15 DIAGNOSIS — W1830XA Fall on same level, unspecified, initial encounter: Secondary | ICD-10-CM | POA: Diagnosis present

## 2022-01-15 DIAGNOSIS — J9601 Acute respiratory failure with hypoxia: Secondary | ICD-10-CM | POA: Diagnosis not present

## 2022-01-15 DIAGNOSIS — I5042 Chronic combined systolic (congestive) and diastolic (congestive) heart failure: Secondary | ICD-10-CM | POA: Diagnosis present

## 2022-01-15 DIAGNOSIS — Z79891 Long term (current) use of opiate analgesic: Secondary | ICD-10-CM | POA: Diagnosis not present

## 2022-01-15 DIAGNOSIS — R296 Repeated falls: Secondary | ICD-10-CM | POA: Diagnosis present

## 2022-01-15 DIAGNOSIS — N2581 Secondary hyperparathyroidism of renal origin: Secondary | ICD-10-CM | POA: Diagnosis present

## 2022-01-15 DIAGNOSIS — Z87891 Personal history of nicotine dependence: Secondary | ICD-10-CM

## 2022-01-15 DIAGNOSIS — I739 Peripheral vascular disease, unspecified: Secondary | ICD-10-CM | POA: Diagnosis present

## 2022-01-15 DIAGNOSIS — H9190 Unspecified hearing loss, unspecified ear: Secondary | ICD-10-CM | POA: Diagnosis present

## 2022-01-15 DIAGNOSIS — D890 Polyclonal hypergammaglobulinemia: Secondary | ICD-10-CM | POA: Diagnosis present

## 2022-01-15 DIAGNOSIS — J690 Pneumonitis due to inhalation of food and vomit: Secondary | ICD-10-CM | POA: Diagnosis present

## 2022-01-15 DIAGNOSIS — A419 Sepsis, unspecified organism: Secondary | ICD-10-CM | POA: Diagnosis present

## 2022-01-15 DIAGNOSIS — Z20822 Contact with and (suspected) exposure to covid-19: Secondary | ICD-10-CM | POA: Diagnosis present

## 2022-01-15 DIAGNOSIS — J189 Pneumonia, unspecified organism: Secondary | ICD-10-CM | POA: Diagnosis not present

## 2022-01-15 DIAGNOSIS — Z79890 Hormone replacement therapy (postmenopausal): Secondary | ICD-10-CM | POA: Diagnosis not present

## 2022-01-15 DIAGNOSIS — I132 Hypertensive heart and chronic kidney disease with heart failure and with stage 5 chronic kidney disease, or end stage renal disease: Secondary | ICD-10-CM | POA: Diagnosis present

## 2022-01-15 DIAGNOSIS — J9621 Acute and chronic respiratory failure with hypoxia: Secondary | ICD-10-CM | POA: Diagnosis present

## 2022-01-15 DIAGNOSIS — I5043 Acute on chronic combined systolic (congestive) and diastolic (congestive) heart failure: Secondary | ICD-10-CM | POA: Diagnosis not present

## 2022-01-15 DIAGNOSIS — I959 Hypotension, unspecified: Secondary | ICD-10-CM | POA: Diagnosis present

## 2022-01-15 DIAGNOSIS — Z9981 Dependence on supplemental oxygen: Secondary | ICD-10-CM | POA: Diagnosis not present

## 2022-01-15 DIAGNOSIS — D631 Anemia in chronic kidney disease: Secondary | ICD-10-CM | POA: Diagnosis present

## 2022-01-15 DIAGNOSIS — E876 Hypokalemia: Secondary | ICD-10-CM | POA: Diagnosis present

## 2022-01-15 DIAGNOSIS — Z79899 Other long term (current) drug therapy: Secondary | ICD-10-CM

## 2022-01-15 DIAGNOSIS — R6520 Severe sepsis without septic shock: Secondary | ICD-10-CM | POA: Diagnosis not present

## 2022-01-15 LAB — CBC WITH DIFFERENTIAL/PLATELET
Abs Immature Granulocytes: 0.04 10*3/uL (ref 0.00–0.07)
Basophils Absolute: 0 10*3/uL (ref 0.0–0.1)
Basophils Relative: 0 %
Eosinophils Absolute: 0.5 10*3/uL (ref 0.0–0.5)
Eosinophils Relative: 4 %
HCT: 35.6 % — ABNORMAL LOW (ref 39.0–52.0)
Hemoglobin: 10.8 g/dL — ABNORMAL LOW (ref 13.0–17.0)
Immature Granulocytes: 0 %
Lymphocytes Relative: 6 %
Lymphs Abs: 0.6 10*3/uL — ABNORMAL LOW (ref 0.7–4.0)
MCH: 33 pg (ref 26.0–34.0)
MCHC: 30.3 g/dL (ref 30.0–36.0)
MCV: 108.9 fL — ABNORMAL HIGH (ref 80.0–100.0)
Monocytes Absolute: 0.7 10*3/uL (ref 0.1–1.0)
Monocytes Relative: 7 %
Neutro Abs: 8.8 10*3/uL — ABNORMAL HIGH (ref 1.7–7.7)
Neutrophils Relative %: 83 %
Platelets: 164 10*3/uL (ref 150–400)
RBC: 3.27 MIL/uL — ABNORMAL LOW (ref 4.22–5.81)
RDW: 15.5 % (ref 11.5–15.5)
WBC: 10.6 10*3/uL — ABNORMAL HIGH (ref 4.0–10.5)
nRBC: 0 % (ref 0.0–0.2)

## 2022-01-15 LAB — BASIC METABOLIC PANEL
Anion gap: 11 (ref 5–15)
BUN: 17 mg/dL (ref 8–23)
CO2: 31 mmol/L (ref 22–32)
Calcium: 7.7 mg/dL — ABNORMAL LOW (ref 8.9–10.3)
Chloride: 96 mmol/L — ABNORMAL LOW (ref 98–111)
Creatinine, Ser: 2.39 mg/dL — ABNORMAL HIGH (ref 0.61–1.24)
GFR, Estimated: 25 mL/min — ABNORMAL LOW (ref >60)
Glucose, Bld: 178 mg/dL — ABNORMAL HIGH (ref 70–99)
Potassium: 3.1 mmol/L — ABNORMAL LOW (ref 3.5–5.1)
Sodium: 138 mmol/L (ref 135–145)

## 2022-01-15 LAB — RESP PANEL BY RT-PCR (FLU A&B, COVID) ARPGX2
Influenza A by PCR: NEGATIVE
Influenza B by PCR: NEGATIVE
SARS Coronavirus 2 by RT PCR: NEGATIVE

## 2022-01-15 LAB — BRAIN NATRIURETIC PEPTIDE: B Natriuretic Peptide: 3798.4 pg/mL — ABNORMAL HIGH (ref 0.0–100.0)

## 2022-01-15 LAB — TROPONIN I (HIGH SENSITIVITY): Troponin I (High Sensitivity): 39 ng/L — ABNORMAL HIGH (ref <18)

## 2022-01-15 MED ORDER — SODIUM CHLORIDE 0.9 % IV SOLN
1.0000 g | Freq: Once | INTRAVENOUS | Status: AC
  Administered 2022-01-16: 1 g via INTRAVENOUS
  Filled 2022-01-15: qty 10

## 2022-01-15 MED ORDER — SODIUM CHLORIDE 0.9 % IV SOLN
500.0000 mg | Freq: Once | INTRAVENOUS | Status: AC
  Administered 2022-01-16: 500 mg via INTRAVENOUS
  Filled 2022-01-15: qty 5

## 2022-01-15 MED ORDER — IOHEXOL 350 MG/ML SOLN
80.0000 mL | Freq: Once | INTRAVENOUS | Status: AC | PRN
  Administered 2022-01-15: 80 mL via INTRAVENOUS

## 2022-01-15 NOTE — ED Triage Notes (Signed)
Pt BIB [facility] from dialysis after receiving complete treatment and reporting weakness afterwards. EMS reports initial sats of 60% on 2L [place], increased to upper 80s with NRB at 15L. Pt A&O x4, restricted on left side d/t dialysis access.

## 2022-01-15 NOTE — ED Provider Notes (Signed)
[facility] EMERGENCY DEPARTMENT Provider Note   CSN: [id number] Arrival date & time: 01/15/22  1629     History  Chief Complaint  Patient presents with   Shortness of Breath    Lonnie [name] is a 88 y.o. male.  88 yo M with a chief complaints of shortness of breath.  The patient states that he has been a bit short of breath for the past few days.  He denies significant cough denies fever denies chest pain or pressure.  He had dialysis completed today and then afterwards his feeling of shortness of breath got much worse.  He was found by EMS to have a pulse ox in the 60s.  He was started on nonrebreather and then sent to the hospital for evaluation.   Shortness of Breath     Home Medications Prior to Admission medications   Medication Sig Start Date End Date Taking? Authorizing Provider  acetaminophen (TYLENOL) 325 MG tablet Take 2 tablets (650 mg total) by mouth every 6 (six) hours as needed for mild pain. 01/26/21   Hans Eden, NP  calcium acetate (PHOSLO) 667 MG capsule Take 1 capsule by mouth 3 (three) times daily with meals.  04/29/17   [provider]  Cyanocobalamin (VITAMIN B-12 PO) Take 1 tablet by mouth daily.    [provider]  diclofenac Sodium (VOLTAREN) 1 % GEL Apply 4 g topically 4 (four) times daily. 04/10/21   Lamptey, Myrene Galas, MD  diphenhydramine-acetaminophen (TYLENOL PM) 25-500 MG TABS tablet Take 1 tablet by mouth at bedtime as needed (sleep).    [provider]  donepezil (ARICEPT) 10 MG tablet Take 10 mg by mouth every evening. 11/09/21   [provider]  levothyroxine (SYNTHROID) 112 MCG tablet Take 1 tablet (112 mcg total) by mouth every morning. 01/01/22 01/31/22  Kayleen Memos, DO  loratadine (CLARITIN) 10 MG tablet Take 10 mg by mouth daily.    [provider]  mirtazapine (REMERON) 15 MG tablet Take 15 mg by mouth at bedtime. 09/27/21   [provider]  multivitamin (RENA-VIT)  TABS tablet Take 1 tablet by mouth daily. 10/18/21   [provider]  pantoprazole (PROTONIX) 20 MG tablet Take 1 tablet (20 mg total) by mouth daily. 01/01/22 01/31/22  Kayleen Memos, DO  polyethylene glycol [facility]) packet Take 17 g by mouth daily. 12/30/18   Robyn Haber, MD  polyvinyl alcohol (LIQUIFILM TEARS) 1.4 % ophthalmic solution Place 1-2 drops into both eyes as needed for dry eyes.    [provider]  RENVELA 800 MG tablet Take 800 mg by mouth 3 (three) times daily. 12/20/21   [provider]  traMADol (ULTRAM) 50 MG tablet Take 50 mg by mouth every 12 (twelve) hours as needed.    [provider]      Allergies    Patient has no known allergies.    Review of Systems   Review of Systems  Respiratory:  Positive for shortness of breath.    Physical Exam Updated Vital Signs BP (!) 107/50 (BP Location: Right Arm)    Pulse 76    Temp 97.8 F (36.6 C) (Axillary)    Resp (!) 26    SpO2 100%  Physical Exam Vitals and nursing note reviewed.  Constitutional:      Appearance: He is well-developed.  HENT:     Head: Normocephalic and atraumatic.  Eyes:     Pupils: Pupils are equal, round, and reactive to  light.  Neck:     Vascular: No JVD.  Cardiovascular:     Rate and Rhythm: Normal rate and regular rhythm.     Heart sounds: No murmur heard.   No friction rub. No gallop.  Pulmonary:     Effort: No respiratory distress.     Breath sounds: Decreased breath sounds (mild in all fields) present. No wheezing.  Abdominal:     General: There is no distension.     Tenderness: There is no abdominal tenderness. There is no guarding or rebound.  Musculoskeletal:        General: Normal range of motion.     Cervical back: Normal range of motion and neck supple.  Skin:    Coloration: Skin is not pale.     Findings: No rash.  Neurological:     Mental Status: He is alert and oriented to person, place, and time.  Psychiatric:        Behavior: Behavior  normal.    ED Results / Procedures / Treatments   Labs (all labs ordered are listed, but only abnormal results are displayed) Labs Reviewed  RESP PANEL BY RT-PCR (FLU A&B, COVID) [id number]  CBC WITH DIFFERENTIAL/PLATELET  BASIC METABOLIC PANEL  BRAIN NATRIURETIC PEPTIDE  TROPONIN I (HIGH SENSITIVITY)    EKG None  Radiology No results found.  Procedures Procedures    Medications Ordered in ED Medications - No data to display  ED Course/ Medical Decision Making/ A&P                           Medical Decision Making Amount and/or Complexity of Data Reviewed Labs: ordered. Radiology: ordered.  Risk Prescription drug management.   Patient is a 88 y.o. male with a cc of shortness of breath.  This been going on for a few days and then worsened acutely after having dialysis done today.  Found to have a pulse ox in the 60s by EMS.  Placed on nonrebreather with improvement.  On my exam no obvious lung involvement of the patient is coughing quite a bit.  We will obtain a chest x-ray blood work COVID test reassess.  I reviewed the patients chart and was admitted about 2 weeks ago for GI bleed.  I independently interpreted the patients labs and imaging chest x-ray no focal infiltrate or pneumothorax.  Hemoglobin appears to be at baseline.  Potassium is mildly low right after dialysis.  Troponin is minimally elevated.  Will obtain a CT angiogram of the chest with sudden onset of worsening shortness of breath.  Still requiring more oxygen than baseline.  CT scan has resulted with no obvious PE.  He does have bilateral pleural effusions and on the right side there was concern for an infiltrate versus mass.  I discussed the results with the patient.  He told me that he has been coughing up some more junk than normal.  I will treat him for possible pneumonia.  He is on 6 L instead of his normal 2.5.  Will discuss with medicine.  CRITICAL CARE Performed by: Cecilio Asper   Total  critical care time: 35 minutes  Critical care time was exclusive of separately billable procedures and treating other patients.  Critical care was necessary to treat or prevent imminent or life-threatening deterioration.  Critical care was time spent personally by me on the following activities: development of treatment plan with patient and/or surrogate as well as nursing, discussions with consultants, evaluation  of patient's response to treatment, examination of patient, obtaining history from patient or surrogate, ordering and performing treatments and interventions, ordering and review of laboratory studies, ordering and review of radiographic studies, pulse oximetry and re-evaluation of patient's condition.  The patients results and plan were reviewed and discussed.   Any x-rays performed were independently reviewed by myself.   Differential diagnosis were considered with the presenting HPI.  Medications  cefTRIAXone (ROCEPHIN) 1 g in sodium chloride 0.9 % 100 mL IVPB (has no administration in time range)  azithromycin (ZITHROMAX) 500 mg in sodium chloride 0.9 % 250 mL IVPB (has no administration in time range)  iohexol (OMNIPAQUE) 350 MG/ML injection 80 mL (80 mLs Intravenous Contrast Given 01/15/22 1938)    Vitals:   01/15/22 1736 01/15/22 1845 01/15/22 1915 01/15/22 2052  BP: (!) 89/59 109/63 (!) 113/59 (!) 113/55  Pulse: 81 (!) 37 73 73  Resp: 18 (!) 25 (!) 24 (!) 30  Temp:      TempSrc:      SpO2: 92% 100% 100% 98%    Final diagnoses:  Community acquired pneumonia of right lower lobe of lung    Admission/ observation were discussed with the admitting physician, patient and/or family and they are comfortable with the plan.         Final Clinical Impression(s) / ED Diagnoses Final diagnoses:  None    Rx / DC Orders ED Discharge Orders     None         Deno Etienne, DO 01/15/22 2119

## 2022-01-15 NOTE — ED Notes (Signed)
X-ray at bedside

## 2022-01-16 ENCOUNTER — Encounter (HOSPITAL_COMMUNITY): Payer: Self-pay | Admitting: Family Medicine

## 2022-01-16 ENCOUNTER — Other Ambulatory Visit: Payer: Self-pay

## 2022-01-16 DIAGNOSIS — J9601 Acute respiratory failure with hypoxia: Secondary | ICD-10-CM

## 2022-01-16 DIAGNOSIS — E039 Hypothyroidism, unspecified: Secondary | ICD-10-CM

## 2022-01-16 DIAGNOSIS — A419 Sepsis, unspecified organism: Secondary | ICD-10-CM

## 2022-01-16 DIAGNOSIS — J189 Pneumonia, unspecified organism: Secondary | ICD-10-CM

## 2022-01-16 DIAGNOSIS — E876 Hypokalemia: Secondary | ICD-10-CM | POA: Diagnosis present

## 2022-01-16 DIAGNOSIS — Z992 Dependence on renal dialysis: Secondary | ICD-10-CM

## 2022-01-16 DIAGNOSIS — I5043 Acute on chronic combined systolic (congestive) and diastolic (congestive) heart failure: Secondary | ICD-10-CM

## 2022-01-16 DIAGNOSIS — N186 End stage renal disease: Secondary | ICD-10-CM

## 2022-01-16 DIAGNOSIS — R6520 Severe sepsis without septic shock: Secondary | ICD-10-CM

## 2022-01-16 LAB — CBC
HCT: 31.3 % — ABNORMAL LOW (ref 39.0–52.0)
Hemoglobin: 9.6 g/dL — ABNORMAL LOW (ref 13.0–17.0)
MCH: 33.6 pg (ref 26.0–34.0)
MCHC: 30.7 g/dL (ref 30.0–36.0)
MCV: 109.4 fL — ABNORMAL HIGH (ref 80.0–100.0)
Platelets: 147 10*3/uL — ABNORMAL LOW (ref 150–400)
RBC: 2.86 MIL/uL — ABNORMAL LOW (ref 4.22–5.81)
RDW: 15.6 % — ABNORMAL HIGH (ref 11.5–15.5)
WBC: 9.6 10*3/uL (ref 4.0–10.5)
nRBC: 0 % (ref 0.0–0.2)

## 2022-01-16 LAB — BASIC METABOLIC PANEL
Anion gap: 12 (ref 5–15)
BUN: 22 mg/dL (ref 8–23)
CO2: 28 mmol/L (ref 22–32)
Calcium: 7.7 mg/dL — ABNORMAL LOW (ref 8.9–10.3)
Chloride: 96 mmol/L — ABNORMAL LOW (ref 98–111)
Creatinine, Ser: 2.71 mg/dL — ABNORMAL HIGH (ref 0.61–1.24)
GFR, Estimated: 21 mL/min — ABNORMAL LOW (ref >60)
Glucose, Bld: 93 mg/dL (ref 70–99)
Potassium: 3.3 mmol/L — ABNORMAL LOW (ref 3.5–5.1)
Sodium: 136 mmol/L (ref 135–145)

## 2022-01-16 LAB — LACTIC ACID, PLASMA: Lactic Acid, Venous: 1.3 mmol/L (ref 0.5–1.9)

## 2022-01-16 LAB — TROPONIN I (HIGH SENSITIVITY): Troponin I (High Sensitivity): 56 ng/L — ABNORMAL HIGH (ref <18)

## 2022-01-16 MED ORDER — ACETAMINOPHEN 325 MG PO TABS: 650.0000 mg | ORAL_TABLET | Freq: Four times a day (QID) | ORAL | Status: DC | PRN

## 2022-01-16 MED ORDER — POLYETHYLENE GLYCOL 3350 17 G PO PACK
17.0000 g | PACK | Freq: Every day | ORAL | Status: DC
  Administered 2022-01-16: 17 g via ORAL
  Filled 2022-01-16: qty 1

## 2022-01-16 MED ORDER — ALBUMIN HUMAN 25 % IV SOLN
25.0000 g | Freq: Once | INTRAVENOUS | Status: AC
  Administered 2022-01-16: 25 g via INTRAVENOUS
  Filled 2022-01-16: qty 100

## 2022-01-16 MED ORDER — HEPARIN SODIUM (PORCINE) 5000 UNIT/ML IJ SOLN
5000.0000 [IU] | Freq: Three times a day (TID) | INTRAMUSCULAR | Status: DC
  Administered 2022-01-16 – 2022-01-17 (×3): 5000 [IU] via SUBCUTANEOUS
  Filled 2022-01-16 (×3): qty 1

## 2022-01-16 MED ORDER — SODIUM CHLORIDE 0.9 % IV SOLN: 500.0000 mg | INTRAVENOUS | Status: DC

## 2022-01-16 MED ORDER — MIDODRINE HCL 5 MG PO TABS
5.0000 mg | ORAL_TABLET | Freq: Three times a day (TID) | ORAL | Status: DC
  Administered 2022-01-16 – 2022-01-17 (×2): 5 mg via ORAL
  Filled 2022-01-16 (×2): qty 1

## 2022-01-16 MED ORDER — LEVOTHYROXINE SODIUM 112 MCG PO TABS
112.0000 ug | ORAL_TABLET | Freq: Every morning | ORAL | Status: DC
  Administered 2022-01-16: 112 ug via ORAL
  Filled 2022-01-16: qty 1

## 2022-01-16 MED ORDER — POTASSIUM CHLORIDE CRYS ER 20 MEQ PO TBCR
20.0000 meq | EXTENDED_RELEASE_TABLET | Freq: Once | ORAL | Status: AC
  Administered 2022-01-16: 20 meq via ORAL
  Filled 2022-01-16: qty 1

## 2022-01-16 MED ORDER — POTASSIUM CHLORIDE CRYS ER 20 MEQ PO TBCR
40.0000 meq | EXTENDED_RELEASE_TABLET | Freq: Once | ORAL | Status: AC
  Administered 2022-01-16: 40 meq via ORAL
  Filled 2022-01-16: qty 2

## 2022-01-16 MED ORDER — PANTOPRAZOLE SODIUM 20 MG PO TBEC
20.0000 mg | DELAYED_RELEASE_TABLET | Freq: Every day | ORAL | Status: DC
  Administered 2022-01-16: 20 mg via ORAL
  Filled 2022-01-16 (×2): qty 1

## 2022-01-16 MED ORDER — SODIUM CHLORIDE 0.9 % IV SOLN: 1.0000 g | INTRAVENOUS | Status: DC

## 2022-01-16 MED ORDER — MIRTAZAPINE 15 MG PO TABS
15.0000 mg | ORAL_TABLET | Freq: Every day | ORAL | Status: DC
  Administered 2022-01-16: 15 mg via ORAL
  Filled 2022-01-16 (×2): qty 1

## 2022-01-16 MED ORDER — SODIUM CHLORIDE 0.9 % IV SOLN
1.5000 g | Freq: Two times a day (BID) | INTRAVENOUS | Status: DC
  Administered 2022-01-16 – 2022-01-17 (×4): 1.5 g via INTRAVENOUS
  Filled 2022-01-16 (×5): qty 4

## 2022-01-16 MED ORDER — DONEPEZIL HCL 10 MG PO TABS
10.0000 mg | ORAL_TABLET | Freq: Every evening | ORAL | Status: DC
  Administered 2022-01-16 – 2022-01-17 (×2): 10 mg via ORAL
  Filled 2022-01-16 (×2): qty 1

## 2022-01-16 MED ORDER — CHLORHEXIDINE GLUCONATE CLOTH 2 % EX PADS: 6.0000 | MEDICATED_PAD | Freq: Every day | CUTANEOUS | Status: DC

## 2022-01-16 NOTE — Progress Notes (Signed)
Patient swallowed well with PO liquids and ice cream, and was reported to eat well with lunch food in ED, however, when he tried to eat dinner (meat loaf and carrots), he had significant choking, suction already set up in room and Dr Doristine Bosworth was sent text page that patient would be NPO due to choking episode, would need to have swallow eval soon

## 2022-01-16 NOTE — Consult Note (Signed)
[facility] Renal Consultation Note    Indication for Consultation:  Management of ESRD/hemodialysis; anemia, hypertension/volume and secondary hyperparathyroidism  DTO:IZTI, Ravisankar, MD  HPI: Lonnie [name] is a 88 y.o. male with ESRD on HD MWF at [facility].  Past medical history significant for chronic hypoxemia on 2 L [place], HF w/EF 45%, diastolic HF, HTN, PVD, Hx GI bleed, and hypothyroidism.  Patient is compliant with prescribed dialysis regimen. Seen by Dr. Royce Macadamia yesterday who started patient on midodrine 5mg  TID due to weakness and recent falls.   Patient presented to the ED from dialysis due to shortness of breath.  Seen and examined at bedside.  Reports when he completed dialysis yesterday, the nurse told him he looked poorly and insisted he go to the ED for evaluation.  He reports he was resistant at first but then agreed, which he was glad because shortness of breath continued to worsen.  Denies CP, fever, chill, n/v/d and fatigue.  Admits to weakness and intermittent SOB for several months.  Reports fall on Saturday.  Denies syncope.  Reports mechanical fall that occurred while using his walking and trying to pull up his pants he fell backwards.  Denies hitting his head.  Continues to have pain in R hip and R elbow.  Admits to coughing spells associated with drinking liquids and some solids.  States he had a swallow study last week but has not heard about the results.  No other specific complaints.   Pertinent findings in the ED include hypoxia (now on 4L O2 via [place]), K 3.3, CXR with cardiomegaly, mild vascular congestion and CTA with no PE, small b/l pleural effusions and large consolidation of RLL atelectasis vs infiltrate vs mass, occulusion of bronchus intermedius w/impacted mucus secretions vs aspiration and dilated esophagus w/ingested content.  Patient has been admitted for further evaluation and management.   Past Medical History:  Diagnosis Date   Adenomatous  polyps    Bursitis    left hip   Chronic kidney disease    Stage 4   Frequent urination at night    GERD (gastroesophageal reflux disease)    Hypertension    Hypothyroidism    Pneumonia    Polyclonal gammopathy determined by serum protein electrophoresis 10/04/2015   Past Surgical History:  Procedure Laterality Date   AORTIC ARCH ANGIOGRAPHY N/A 08/01/2017   Procedure: Aortic Arch Angiography;  Surgeon: Conrad Terminous, MD;  Location: [place] [facility];  Service: Cardiovascular;  Laterality: N/A;   AV FISTULA PLACEMENT Left 07/10/2016   Procedure: LEFT BRACHIOCEPHALIC ARTERIOVENOUS (AV) FISTULA CREATION;  Surgeon: Conrad Sparks, MD;  Location: [place];  Service: Vascular;  Laterality: Left;   COLONOSCOPY     RENAL BIOPSY Left 10/06/2015   REVISON OF ARTERIOVENOUS FISTULA Left 80/99/8338   Procedure: PLICATION OF LEFT BRACHIOCEPHALIC ARTERIOVENOUS FISTULA;  Surgeon: Conrad [place], MD;  Location: [place];  Service: Vascular;  Laterality: Left;   SKIN SURGERY     back   UPPER EXTREMITY ANGIOGRAPHY Left 08/01/2017   Procedure: Upper Extremity Angiography;  Surgeon: Conrad , MD;  Location: [place] [facility];  Service: Cardiovascular;  Laterality: Left;   Family History  Problem Relation Age of Onset   Diabetes Father    Diabetes Brother    Heart disease Brother    Colon cancer Neg Hx    Rectal cancer Neg Hx    Social History:  reports that he quit smoking about 42 years ago. His smoking use  included cigarettes. He has never used smokeless tobacco. He reports that he does not drink alcohol and does not use drugs. No Known Allergies Prior to Admission medications   Medication Sig Start Date End Date Taking? Authorizing Provider  Cyanocobalamin (VITAMIN B-12 PO) Take 1 tablet by mouth daily.   Yes [provider]  donepezil (ARICEPT) 10 MG tablet Take 10 mg by mouth every evening. 11/09/21  Yes [provider]  levothyroxine (SYNTHROID) 112 MCG tablet Take 1 tablet (112  mcg total) by mouth every morning. 01/01/22 01/31/22 Yes Hall, Carole N, DO  loratadine (CLARITIN) 10 MG tablet Take 10 mg by mouth daily.   Yes [provider]  mirtazapine (REMERON) 15 MG tablet Take 15 mg by mouth at bedtime. 09/27/21  Yes [provider]  multivitamin (RENA-VIT) TABS tablet Take 1 tablet by mouth daily.   Yes [provider]  pantoprazole (PROTONIX) 20 MG tablet Take 1 tablet (20 mg total) by mouth daily. 01/01/22 01/31/22 Yes Kayleen Memos, DO  acetaminophen (TYLENOL) 325 MG tablet Take 2 tablets (650 mg total) by mouth every 6 (six) hours as needed for mild pain. 01/26/21   Hans Eden, NP  calcium acetate (PHOSLO) 667 MG capsule Take 1 capsule by mouth 3 (three) times daily with meals.  04/29/17   [provider]  diclofenac Sodium (VOLTAREN) 1 % GEL Apply 4 g topically 4 (four) times daily. 04/10/21   Lamptey, Myrene Galas, MD  diphenhydramine-acetaminophen (TYLENOL PM) 25-500 MG TABS tablet Take 1 tablet by mouth at bedtime as needed (sleep).    [provider]  multivitamin (RENA-VIT) TABS tablet Take 1 tablet by mouth daily. 10/18/21   [provider]  polyethylene glycol (MIRALAX) packet Take 17 g by mouth daily. 12/30/18   Robyn Haber, MD  polyvinyl alcohol (LIQUIFILM TEARS) 1.4 % ophthalmic solution Place 1-2 drops into both eyes as needed for dry eyes.    [provider]  traMADol (ULTRAM) 50 MG tablet Take 50 mg by mouth every 12 (twelve) hours as needed.    [provider]   Current Facility-Administered Medications  Medication Dose Route Frequency Provider Last Rate Last Admin   acetaminophen (TYLENOL) tablet 650 mg  650 mg Oral Q6H PRN Pahwani, Ravi, MD       ampicillin-sulbactam (UNASYN) 1.5 g in sodium chloride 0.9 % 100 mL IVPB  1.5 g Intravenous Q12H Erenest Blank, RPH   Stopped at 01/16/22 1104   donepezil (ARICEPT) tablet 10 mg  10 mg Oral QPM Pahwani, Einar Grad, MD       heparin injection  5,000 Units  5,000 Units Subcutaneous Q8H Tu, Ching T, DO   5,000 Units at 01/16/22 1350   levothyroxine (SYNTHROID) tablet 112 mcg  112 mcg Oral q morning Pahwani, Einar Grad, MD   112 mcg at 01/16/22 1105   midodrine (PROAMATINE) tablet 5 mg  5 mg Oral TID WC Chistine Dematteo, Ria Comment, PA       mirtazapine (REMERON) tablet 15 mg  15 mg Oral QHS Pahwani, Ravi, MD       pantoprazole (PROTONIX) EC tablet 20 mg  20 mg Oral Daily Pahwani, Ravi, MD   20 mg at 01/16/22 1105   polyethylene glycol (MIRALAX / GLYCOLAX) packet 17 g  17 g Oral Daily Darliss Cheney, MD   17 g at 01/16/22 1106   Labs: Basic Metabolic Panel: Recent Labs  Lab 01/15/22 1712 01/16/22 0146  NA 138 136  K 3.1* 3.3*  CL 96*  96*  CO2 31 28  GLUCOSE 178* 93  BUN 17 22  CREATININE 2.39* 2.71*  CALCIUM 7.7* 7.7*   CBC: Recent Labs  Lab 01/15/22 1712 01/16/22 0146  WBC 10.6* 9.6  NEUTROABS 8.8*  --   HGB 10.8* 9.6*  HCT 35.6* 31.3*  MCV 108.9* 109.4*  PLT 164 147*   Studies/Results: CT Angio Chest PE W and/or Wo Contrast  Result Date: 01/15/2022 CLINICAL DATA:  Concern for pulmonary embolism. EXAM: CT ANGIOGRAPHY CHEST WITH CONTRAST TECHNIQUE: Multidetector CT imaging of the chest was performed using the standard protocol during bolus administration of intravenous contrast. Multiplanar CT image reconstructions and MIPs were obtained to evaluate the vascular anatomy. RADIATION DOSE REDUCTION: This exam was performed according to the departmental dose-optimization program which includes automated exposure control, adjustment of the mA and/or kV according to patient size and/or use of iterative reconstruction technique. CONTRAST:  60mL OMNIPAQUE IOHEXOL 350 MG/ML SOLN COMPARISON:  Chest radiograph dated 01/15/2022. FINDINGS: Evaluation is limited due to streak artifact caused by patient's arms. Cardiovascular: Mild cardiomegaly. There is retrograde flow of contrast from the right atrium into the IVC suggestive of right heart  dysfunction. No pericardial effusion. Advanced 3 vessel coronary vascular calcification. There is moderate atherosclerotic calcification of the thoracic aorta. No aneurysmal dilatation or dissection. There is dilatation of the main pulmonary trunk suggestive of pulmonary hypertension. Evaluation of the pulmonary arteries is limited due to respiratory motion artifact. No central pulmonary artery embolus identified. Faint low attenuating density in the subsegmental right lower lobe pulmonary artery branch (182/6) may be artifactual. A nonocclusive thrombus or scarring is not excluded. Mediastinum/Nodes: No definite mediastinal adenopathy. Evaluation of the hilar lymph nodes is limited due to consolidative changes of the adjacent lungs. There is dilatation of the esophagus with ingested content which may represent delayed clearance or reflux. This can predispose to aspiration. No mediastinal fluid collection. Lungs/Pleura: Small bilateral pleural effusions. There is a large area of consolidation involving the right lower lobe which may represent atelectasis or infiltrate. Underlying mass is not excluded clinical correlation and follow-up recommended. There is partial compressive atelectasis of the right middle lobe and left lower lobe versus pneumonia. There is background of emphysema. No pneumothorax. There is occlusion of the bronchus intermedius with impacted mucus secretions versus aspiration content. Upper Abdomen: No acute abnormality. Musculoskeletal: Osteopenia degenerative changes of the spine. No acute osseous pathology. Bilateral gynecomastia. Review of the MIP images confirms the above findings. IMPRESSION: 1. No central pulmonary artery embolus identified. Faint low attenuating density in the subsegmental right lower lobe pulmonary artery branch may be artifactual. A nonocclusive thrombus or scarring is not excluded. 2. Small bilateral pleural effusions with a large area of consolidation involving the  right lower lobe which may represent atelectasis or infiltrate. Underlying mass is not excluded. 3. Occlusion of the bronchus intermedius with impacted mucus secretions versus aspiration content. 4. Dilated esophagus with ingested content which may represent delayed clearance or reflux. This can predispose to aspiration. 5. Aortic Atherosclerosis (ICD10-I70.0) and Emphysema (ICD10-J43.9). Electronically Signed   By: Anner Crete M.D.   On: 01/15/2022 20:06   DG Chest Port 1 View  Result Date: 01/15/2022 CLINICAL DATA:  Shortness of breath EXAM: PORTABLE CHEST 1 VIEW COMPARISON:  08/02/2021 FINDINGS: AP portable radiograph. Chin overlies the apices. Midline trachea. Mild cardiomegaly. Small left pleural effusion is unchanged. No pneumothorax. Mild pulmonary venous congestion. Left greater than right base airspace disease. IMPRESSION: 1. Cardiomegaly with mild pulmonary venous congestion. 2. Similar small  left pleural effusion with adjacent airspace disease. 3. Probable atelectasis at the right lung base. Electronically Signed   By: Abigail Miyamoto M.D.   On: 01/15/2022 17:07    ROS: All others negative except those listed in HPI.  Physical Exam: Vitals:   01/16/22 1000 01/16/22 1100 01/16/22 1200 01/16/22 1217  BP: (!) 99/45 (!) 111/56 (!) 110/54   Pulse: (!) 55 69 69   Resp: 18 (!) 22 (!) 22   Temp:    (!) 97.5 F (36.4 C)  TempSrc:    Oral  SpO2: 100% 100% 94%      General: WD, frail, elderly male in NAD Head: NCAT sclera not icteric MMM Neck: Supple. No lymphadenopathy Lungs: +crackles in bases, breath sounds decreased, nml WOB on 4L O2 via [place] Heart: RRR. No murmur, rubs or gallops.  Abdomen: soft, nontender, +BS, no guarding, no rebound tenderness  Lower extremities:1-2+ edema, no ischemic changes, or open wounds  Neuro: AAOx3. Moves all extremities spontaneously. Psych:  Responds to questions appropriately with a normal affect. Dialysis Access: LU AVF +b/t  Dialysis Orders:  MWF -  [facility]  3.5hrs, BFR 400, DFR 800,  EDW 61kg, 2K/ 2Ca  Access: LU AVF   Heparin none Mircera 50 mcg q2wks - last 2/10 Calcitriol 0.60mcg PO qHD  Assessment/Plan:  Acute on chronic hypoxic respiratory failure 2/2 aspiration PNA - on 4L O2 via [place].  CTA with RLL consolidation thought to be impacted mucus vs aspiration.   Dysphagia - swallow study completed last week.  Dysphagia 3 diet recommended. Per PMD. Hypokalemia - K low following dialysis.  Repleted.  Follow labs.  ESRD -  On HD MWF.  Orders written for HD tomorrow per regular schedule.   Hypotension/volume  - Blood pressure soft.  Midodrine started at outpatient HD clinic yesterday.  Will start here 5mg  TID.  CXR with vascular congestion and LE edema on exam.  UF as tolerated with HD tomorrow.  Outpatient EDW recently raised.   Anemia of CKD - Hgb 10.8. no indication for ESA at this time.  Secondary Hyperparathyroidism -  Ca 7.7 following dialysis.  Check albumin and phos.  Continue calcitriol.  Nutrition - Dysphagia 3 diet w/fluid restrictions.  Chronic combined CHF - per PMD  Jen Mow, PA-C [facility] 01/16/2022, 2:02 PM

## 2022-01-16 NOTE — Progress Notes (Signed)
Pharmacy Antibiotic Note  Lonnie [name] is a 88 y.o. male admitted on 01/15/2022 with pneumonia.  Pharmacy has been consulted for Unasyn dosing for possible aspiration pneumonia. WBC ok. ESRD on HD.   Plan: Unasyn 1.5g IV q12h Trend WBC, temp, HD schedule F/U infectious work-up      Temp (24hrs), Avg:98.4 F (36.9 C), Min:97.8 F (36.6 C), Max:98.9 F (37.2 C)  Recent Labs  Lab 01/15/22 1712 01/16/22 0146  WBC 10.6* 9.6  CREATININE 2.39* 2.71*    CrCl cannot be calculated (Unknown ideal weight.).    No Known Allergies  Narda Bonds, PharmD, BCPS Clinical Pharmacist Phone: [phone]

## 2022-01-16 NOTE — Progress Notes (Signed)
Cross-coverage note:   Called regarding hypotension, BP 87/43.   Patient is asymptomatic and has ESRD and EF 20%.   Plan to give some albumin, check lactate, and increase level of care.

## 2022-01-16 NOTE — H&P (Signed)
History and Physical    Patient: Lonnie [name] JXB:[id number] DOB: 07-17-1925 DOA: 01/15/2022 DOS: the patient was seen and examined on 01/16/2022 PCP: Prince Solian, MD  Patient coming from: Home  Chief Complaint:  Chief Complaint  Patient presents with   Shortness of Breath    HPI: Lonnie [name] is a 88 y.o. male with medical history significant of ESRD on HD MWF, chronic hypoxemia on 2 L [place], combined diastolic and systolic CHF, hypertension, peripheral vascular disease, GI bleed, hypertension and hypothyroidism who presents with concerns of increasing shortness of breath.  Patient completed a session of dialysis today reported increasing weakness.  Per EMS report, he was found to be hypoxic down to 60% on his 2 L.  Initially placed on nonrebreather 15 L and was able to wean down to 6 L in the ED.  He reports feeling more weak and short of breath with exertion.  Has been coughing with productive green sputum for the past 2 to 3 weeks.  Denies any fever.  No chest pain.  No current tobacco use.  CBC with leukocytosis of 10.6, hemoglobin 10.8.  Sodium of 138, K of 3.1, creatinine of 2.39 from prior around 6.  BG of 178. Troponin of 39 and a BNP of 3780.  CTA chest showed no central pulmonary artery embolism.  Nonocclusive thrombus versus scarring nonoccluded.  Small bilateral pleural effusion with large area of consolidation to the right lower lobe possible infiltrate versus atelectasis.  Underlying mass not excluded.  There is also occlusion of the bronchus intermedius with impacted mucus secretion versus aspiration content.  Dilated esophagus with ingested content which may predispose to aspiration.  He was started on IV Rocephin and azithromycin in the ED  Review of Systems: As mentioned in the history of present illness. All other systems reviewed and are negative. Past Medical History:  Diagnosis Date   Adenomatous polyps    Bursitis    left hip   Chronic kidney disease     Stage 4   Frequent urination at night    GERD (gastroesophageal reflux disease)    Hypertension    Hypothyroidism    Pneumonia    Polyclonal gammopathy determined by serum protein electrophoresis 10/04/2015   Past Surgical History:  Procedure Laterality Date   AORTIC ARCH ANGIOGRAPHY N/A 08/01/2017   Procedure: Aortic Arch Angiography;  Surgeon: Conrad Woodhaven, MD;  Location: [facility];  Service: Cardiovascular;  Laterality: N/A;   AV FISTULA PLACEMENT Left 07/10/2016   Procedure: LEFT BRACHIOCEPHALIC ARTERIOVENOUS (AV) FISTULA CREATION;  Surgeon: Conrad Bauxite, MD;  Location: [facility];  Service: Vascular;  Laterality: Left;   COLONOSCOPY     RENAL BIOPSY Left 10/06/2015   REVISON OF ARTERIOVENOUS FISTULA Left 13/07/6577   Procedure: PLICATION OF LEFT BRACHIOCEPHALIC ARTERIOVENOUS FISTULA;  Surgeon: Conrad Westbury, MD;  Location: [facility];  Service: Vascular;  Laterality: Left;   SKIN SURGERY     back   UPPER EXTREMITY ANGIOGRAPHY Left 08/01/2017   Procedure: Upper Extremity Angiography;  Surgeon: Conrad Mount Repose, MD;  Location: [facility];  Service: Cardiovascular;  Laterality: Left;   Social History:  reports that he quit smoking about 42 years ago. His smoking use included cigarettes. He has never used smokeless tobacco. He reports that he does not drink alcohol and does not use drugs.  No Known Allergies  Family History  Problem Relation Age of Onset   Diabetes Father    Diabetes Brother  Heart disease Brother    Colon cancer Neg Hx    Rectal cancer Neg Hx     Prior to Admission medications   Medication Sig Start Date End Date Taking? Authorizing Provider  Cyanocobalamin (VITAMIN B-12 PO) Take 1 tablet by mouth daily.   Yes [provider]  donepezil (ARICEPT) 10 MG tablet Take 10 mg by mouth every evening. 11/09/21  Yes [provider]  levothyroxine (SYNTHROID) 112 MCG tablet Take 1 tablet (112 mcg total) by mouth every morning. 01/01/22 01/31/22 Yes  Hall, Carole N, DO  loratadine (CLARITIN) 10 MG tablet Take 10 mg by mouth daily.   Yes [provider]  mirtazapine (REMERON) 15 MG tablet Take 15 mg by mouth at bedtime. 09/27/21  Yes [provider]  multivitamin (RENA-VIT) TABS tablet Take 1 tablet by mouth daily.   Yes [provider]  pantoprazole (PROTONIX) 20 MG tablet Take 1 tablet (20 mg total) by mouth daily. 01/01/22 01/31/22 Yes Kayleen Memos, DO  acetaminophen (TYLENOL) 325 MG tablet Take 2 tablets (650 mg total) by mouth every 6 (six) hours as needed for mild pain. 01/26/21   Hans Eden, NP  calcium acetate (PHOSLO) 667 MG capsule Take 1 capsule by mouth 3 (three) times daily with meals.  04/29/17   [provider]  diclofenac Sodium (VOLTAREN) 1 % GEL Apply 4 g topically 4 (four) times daily. 04/10/21   Lamptey, Myrene Galas, MD  diphenhydramine-acetaminophen (TYLENOL PM) 25-500 MG TABS tablet Take 1 tablet by mouth at bedtime as needed (sleep).    [provider]  multivitamin (RENA-VIT) TABS tablet Take 1 tablet by mouth daily. 10/18/21   [provider]  polyethylene glycol (MIRALAX) packet Take 17 g by mouth daily. 12/30/18   Robyn Haber, MD  polyvinyl alcohol (LIQUIFILM TEARS) 1.4 % ophthalmic solution Place 1-2 drops into both eyes as needed for dry eyes.    [provider]  traMADol (ULTRAM) 50 MG tablet Take 50 mg by mouth every 12 (twelve) hours as needed.    [provider]    Physical Exam: Vitals:   01/15/22 2052 01/16/22 0028 01/16/22 0030 01/16/22 0045  BP: (!) 113/55  (!) 95/42 (!) 92/44  Pulse: 73  62 64  Resp: (!) 30  (!) 26 (!) 26  Temp:  98.9 F (37.2 C)    TempSrc:  Oral    SpO2: 98%  97% 100%   Constitutional: NAD, calm, comfortable, chronically ill-appearing elderly male laying flat in bed Eyes: lids and conjunctivae normal ENMT: Mucous membranes are moist.  Neck: normal, supple, Respiratory: Bibasilar crackles.  Normal  respiratory effort on 6 L. No accessory muscle use.  Able to speak in full sentences. Cardiovascular: Regular rate and rhythm, no murmurs / rubs / gallops. No extremity edema.  Left upper extremity AV fistula Abdomen: no tenderness,  Bowel sounds positive.  Musculoskeletal: no clubbing / cyanosis. No joint deformity upper and lower extremities.  Skin: no rashes, lesions, ulcers.  Neurologic: CN 2-12 grossly intact.  Strength 5/5 in all 4.  Psychiatric: Normal judgment and insight. Alert and oriented x 3. Normal mood.  Data Reviewed:  See HPI  Assessment and Plan:  Acute on chronic hypoxemic respiratory failure secondary to aspiration pneumonia -Baseline on 2 L now requiring 6 L on admitted -CTA chest showing right lower lobe consolidation, impacted mucus versus aspiration content -On IV Rocephin and azithromycin in the ED.  Will switch to Unasyn.  Sepsis secondary to aspiration pneumonia -  unable to give 30cc/kg bolus due to ESRD  -continue IV antibiotics above   ESRD on HD MWF -Completed full session today.  Creatinine is stable. - need nephrology consult for scheduled dialysis  Chronic combined diastolic and systolic heart failure -Troponin and BNP are elevated but likely his baseline.  He just received for dialysis session today with minimal pleural effusion on CXR   Hypokalemia - K of 3.1 -oral K supplement 40meq  Hypothyroidism -resume levothyroxine once med rec verified    Advance Care Planning:   Code Status: Full Code   Consults:   Family Communication: No family at bedside  Severity of Illness: The appropriate patient status for this patient is INPATIENT. Inpatient status is judged to be reasonable and necessary in order to provide the required intensity of service to ensure the patient's safety. The patient's presenting symptoms, physical exam findings, and initial radiographic and laboratory data in the context of their chronic comorbidities is felt to place  them at high risk for further clinical deterioration. Furthermore, it is not anticipated that the patient will be medically stable for discharge from the hospital within 2 midnights of admission.   * I certify that at the point of admission it is my clinical judgment that the patient will require inpatient hospital care spanning beyond 2 midnights from the point of admission due to high intensity of service, high risk for further deterioration and high frequency of surveillance required.*  Author: Orene Desanctis, DO 01/16/2022 1:33 AM  For on call review [email or web address].

## 2022-01-16 NOTE — Evaluation (Signed)
Occupational Therapy Evaluation Patient Details Name: Lonnie [name] MRN: [id number] DOB: [birth year] Today's Date: 01/16/2022   History of Present Illness Pt is a 88 y/o male admitted secondary to weakness and hypoxia. PMH includes ESRD on HD, CHF, HTN, PVD.   Clinical Impression   PTA patient reports having assist for most ADLs but able to ambulate using RW into restroom, typically uses scooter for mobility. Admitted for above and presents with problem list below, including weakness, decreased activity tolerance and impaired balance. He requires +2 min assist for transfers and side stepping at EOB, up to total assist for LB ADLs and min assist for UB ADLs.  He reports having 24/7 support from aide/nephews, receiving HHOT/PT prior to admission. VSS on 4L [place].  Educated on safety and assist with mobility, transfers and ADLs at dc. Recommend continued OT services acutely and resumption of OT services after dc home.      Recommendations for follow up therapy are one component of a multi-disciplinary discharge planning process, led by the attending physician.  Recommendations may be updated based on patient status, additional functional criteria and insurance authorization.   Follow Up Recommendations  Home health OT    Assistance Recommended at Discharge Frequent or constant Supervision/Assistance  Patient can return home with the following A little help with walking and/or transfers;A lot of help with bathing/dressing/bathroom;Assistance with cooking/housework;Direct supervision/assist for medications management;Direct supervision/assist for financial management;Assist for transportation;Help with stairs or ramp for entrance    Functional Status Assessment  Patient has had a recent decline in their functional status and demonstrates the ability to make significant improvements in function in a reasonable and predictable amount of time.  Equipment Recommendations  None recommended by OT     Recommendations for Other Services       Precautions / Restrictions Precautions Precautions: Fall Restrictions Weight Bearing Restrictions: No      Mobility Bed Mobility Overal bed mobility: Needs Assistance Bed Mobility: Supine to Sit, Sit to Supine     Supine to sit: Min assist Sit to supine: Min guard   General bed mobility comments: Required assist for LE initiation. Min A for trunk assist    Transfers Overall transfer level: Needs assistance Equipment used: 2 person hand held assist Transfers: Sit to/from Stand Sit to Stand: Min assist, +2 physical assistance           General transfer comment: Min A +2 for lift assist and steadying. Initially with posterior lean, but was able to correct with assist. Able to take side steps at EOB with min A +2 for steadying.      Balance Overall balance assessment: Needs assistance Sitting-balance support: Feet supported Sitting balance-Leahy Scale: Fair     Standing balance support: Bilateral upper extremity supported, During functional activity Standing balance-Leahy Scale: Poor Standing balance comment: Reliant on BUE and external support                           ADL either performed or assessed with clinical judgement   ADL Overall ADL's : Needs assistance/impaired                 Upper Body Dressing : Sitting;Minimal assistance   Lower Body Dressing: Total assistance;Sit to/from stand   Toilet Transfer: Minimal assistance;+2 for physical assistance;+2 for safety/equipment Toilet Transfer Details (indicate cue type and reason): side stepping to [facility]         Functional mobility during ADLs: Minimal assistance;+2 for  physical assistance;+2 for safety/equipment       Vision   Vision Assessment?: No apparent visual deficits     Perception     Praxis      Pertinent Vitals/Pain Pain Assessment Pain Assessment: No/denies pain     Hand Dominance Right   Extremity/Trunk Assessment  Upper Extremity Assessment Upper Extremity Assessment: Generalized weakness (Limited functional use of R shoulder due to arthritis)   Lower Extremity Assessment Lower Extremity Assessment: Defer to PT evaluation   Cervical / Trunk Assessment Cervical / Trunk Assessment: Kyphotic   Communication Communication Communication: HOH   Cognition Arousal/Alertness: Awake/alert Behavior During Therapy: WFL for tasks assessed/performed Overall Cognitive Status: No family/caregiver present to determine baseline cognitive functioning                                 General Comments: Pt tends to be repetitive throughout session and reports he feels like he is in jail and that he has not had food. He is able to follow simple commands with increased time.     General Comments  VSS on 4L [place] via [place]    Exercises     Shoulder Instructions      Home Living Family/patient expects to be discharged to:: Private residence Living Arrangements: Other relatives (2 nephews throughout the week) Available Help at Discharge: Family;Personal care attendant (aide 7 days/week 1 hour) Type of Home: House Home Access: Ramped entrance     Home Layout: One level     Bathroom Shower/Tub: Sponge bathes at [place]: Rollator (4 wheels);Cane - single point;BSC/3in1;Shower seat;Electric scooter   Additional Comments: Pt reports nephews are present throughout the day      Prior Functioning/Environment Prior Level of Function : Needs assist             Mobility Comments: Uses scooter mainly for mobility. Able to ambulate short distance using rollator to bathroom ADLs Comments: Aide assists with meals and ADL tasks. Nephews assist with IADLs.        OT Problem List: Decreased strength;Decreased activity tolerance;Impaired balance (sitting and/or standing);Decreased cognition;Decreased safety awareness;Decreased knowledge of use of DME or  AE;Decreased knowledge of precautions;Cardiopulmonary status limiting activity      OT Treatment/Interventions: Self-care/ADL training;Therapeutic exercise;DME and/or AE instruction;Therapeutic activities;Patient/family education;Balance training    OT Goals(Current goals can be found in the care plan section) Acute Rehab OT Goals Patient Stated Goal: to get a room OT Goal Formulation: With patient Time For Goal Achievement: 01/30/22 Potential to Achieve Goals: Fair  OT Frequency: Min 2X/week    Co-evaluation PT/OT/SLP Co-Evaluation/Treatment: Yes Reason for Co-Treatment: For patient/therapist safety;To address functional/ADL transfers PT goals addressed during session: Mobility/safety with mobility;Balance OT goals addressed during session: ADL's and self-care      AM-PAC OT "6 Clicks" Daily Activity     Outcome Measure Help from another person eating meals?: A Little Help from another person taking care of personal grooming?: A Little Help from another person toileting, which includes using toliet, bedpan, or urinal?: Total Help from another person bathing (including washing, rinsing, drying)?: A Lot Help from another person to put on and taking off regular upper body clothing?: A Little Help from another person to put on and taking off regular lower body clothing?: Total 6 Click Score: 13   End of Session Equipment Utilized During Treatment: Oxygen Nurse Communication: Mobility status;Other (comment)  Activity Tolerance: Patient tolerated treatment well Patient left: in bed;with call bell/phone within reach  OT Visit Diagnosis: Unsteadiness on feet (R26.81);Muscle weakness (generalized) (M62.81)                Time: 1749-4496 OT Time Calculation (min): 17 min Charges:  OT General Charges $OT Visit: 1 Visit OT Evaluation $OT Eval Moderate Complexity: 1 Mod  Jolaine Artist, OT Acute Rehabilitation Services Pager [phone] Office [phone]   Delight Stare 01/16/2022, 11:33 AM

## 2022-01-16 NOTE — Evaluation (Signed)
Physical Therapy Evaluation Patient Details Name: Lonnie [name] MRN: [id number] DOB: 31-Mar-1925 Today's Date: 01/16/2022  History of Present Illness  Pt is a 88 y/o male admitted secondary to weakness and hypoxia. PMH includes ESRD on HD, CHF, HTN, PVD.  Clinical Impression  Pt admitted secondary to problem above with deficits below. Pt requiring min A +2 to stand and take side steps at EOB. Cues for sequencing throughout. Pt reports nephew and aide are able to assist as needed. REports he was receiving HHPT/OT prior to admission, so recommend resumption at d/c. Also educated about need for assist initially with transfers and short distance gait. Will continue to follow acutely.        Recommendations for follow up therapy are one component of a multi-disciplinary discharge planning process, led by the attending physician.  Recommendations may be updated based on patient status, additional functional criteria and insurance authorization.  Follow Up Recommendations Home health PT    Assistance Recommended at Discharge Frequent or constant Supervision/Assistance  Patient can return home with the following  A little help with walking and/or transfers;Help with stairs or ramp for entrance;Assist for transportation;Direct supervision/assist for financial management;Direct supervision/assist for medications management;Assistance with cooking/housework;A lot of help with bathing/dressing/bathroom    Equipment Recommendations None recommended by PT  Recommendations for Other Services       Functional Status Assessment Patient has had a recent decline in their functional status and demonstrates the ability to make significant improvements in function in a reasonable and predictable amount of time.     Precautions / Restrictions Precautions Precautions: Fall Restrictions Weight Bearing Restrictions: No      Mobility  Bed Mobility Overal bed mobility: Needs Assistance Bed Mobility: Supine  to Sit, Sit to Supine     Supine to sit: Min assist Sit to supine: Min guard   General bed mobility comments: Required assist for LE initiation. Min A for trunk assist    Transfers Overall transfer level: Needs assistance Equipment used: 2 person hand held assist Transfers: Sit to/from Stand Sit to Stand: Min assist, +2 physical assistance           General transfer comment: Min A +2 for lift assist and steadying. Initially with posterior lean, but was able to correct with assist. Able to take side steps at EOB with min A +2 for steadying.    Ambulation/Gait                  Stairs            Wheelchair Mobility    Modified Rankin (Stroke Patients Only)       Balance Overall balance assessment: Needs assistance Sitting-balance support: Feet supported Sitting balance-Leahy Scale: Fair     Standing balance support: Bilateral upper extremity supported, During functional activity Standing balance-Leahy Scale: Poor Standing balance comment: Reliant on BUE and external support                             Pertinent Vitals/Pain Pain Assessment Pain Assessment: No/denies pain    Home Living Family/patient expects to be discharged to:: Private residence Living Arrangements: Other relatives (2 nephews throughout the week) Available Help at Discharge: Family;Personal care attendant (aide 7 days/week 1 hour) Type of Home: House Home Access: Ramped entrance       Home Layout: One level Home Equipment: Rollator (4 wheels);Cane - single point;BSC/3in1;Shower seat;Electric scooter Additional Comments: Pt reports nephews are present throughout  the day    Prior Function Prior Level of Function : Needs assist             Mobility Comments: Uses scooter mainly for mobility. Able to ambulate short distance using rollator to bathroom ADLs Comments: Aide assists with meals and ADL tasks. Nephews assist with IADLs.     Hand Dominance    Dominant Hand: Right    Extremity/Trunk Assessment   Upper Extremity Assessment Upper Extremity Assessment: Defer to OT evaluation    Lower Extremity Assessment Lower Extremity Assessment: Generalized weakness    Cervical / Trunk Assessment Cervical / Trunk Assessment: Kyphotic  Communication   Communication: HOH  Cognition Arousal/Alertness: Awake/alert Behavior During Therapy: WFL for tasks assessed/performed Overall Cognitive Status: No family/caregiver present to determine baseline cognitive functioning                                 General Comments: Pt tends to be repetitive throughout session and reports he feels like he is in jail and that he has not had food.        General Comments      Exercises     Assessment/Plan    PT Assessment Patient needs continued PT services  PT Problem List Decreased strength;Decreased activity tolerance;Decreased balance;Decreased mobility;Decreased cognition;Decreased safety awareness       PT Treatment Interventions DME instruction;Gait training;Functional mobility training;Therapeutic activities;Therapeutic exercise;Balance training;Patient/family education    PT Goals (Current goals can be found in the Care Plan section)  Acute Rehab PT Goals Patient Stated Goal: to go home PT Goal Formulation: With patient Time For Goal Achievement: 01/30/22 Potential to Achieve Goals: Fair    Frequency Min 3X/week     Co-evaluation PT/OT/SLP Co-Evaluation/Treatment: Yes Reason for Co-Treatment: For patient/therapist safety;To address functional/ADL transfers PT goals addressed during session: Mobility/safety with mobility;Balance         AM-PAC PT "6 Clicks" Mobility  Outcome Measure Help needed turning from your back to your side while in a flat bed without using bedrails?: A Little Help needed moving from lying on your back to sitting on the side of a flat bed without using bedrails?: A Little Help needed  moving to and from a bed to a chair (including a wheelchair)?: A Lot Help needed standing up from a chair using your arms (e.g., wheelchair or bedside chair)?: A Lot Help needed to walk in hospital room?: Total Help needed climbing 3-5 steps with a railing? : Total 6 Click Score: 12    End of Session Equipment Utilized During Treatment: Gait belt;Oxygen Activity Tolerance: Patient tolerated treatment well Patient left: in bed;with call bell/phone within reach (on stretcher in ED) Nurse Communication: Mobility status;Other (comment) PT Visit Diagnosis: Unsteadiness on feet (R26.81);Muscle weakness (generalized) (M62.81)    Time: 8309-4076 PT Time Calculation (min) (ACUTE ONLY): 17 min   Charges:   PT Evaluation $PT Eval Moderate Complexity: 1 Mod          Reuel Derby, PT, DPT  Acute Rehabilitation Services  Pager: [phone] Office: [phone]   Rudean Hitt 01/16/2022, 11:13 AM

## 2022-01-16 NOTE — ED Notes (Signed)
First set of blood cultures obtained prior to antibiotics starting

## 2022-01-16 NOTE — Progress Notes (Addendum)
PROGRESS NOTE    Lonnie [name]  DPO:[id number] DOB: 1925-07-24 DOA: 01/15/2022 PCP: Prince Solian, MD   Brief Narrative:  HPI: Lonnie [name] is a 88 y.o. male with medical history significant of ESRD on HD MWF, chronic hypoxemia on 2 L [place], combined diastolic and systolic CHF, hypertension, peripheral vascular disease, GI bleed, hypertension and hypothyroidism who presents with concerns of increasing shortness of breath.   Patient completed a session of dialysis today reported increasing weakness.  Per EMS report, he was found to be hypoxic down to 60% on his 2 L.  Initially placed on nonrebreather 15 L and was able to wean down to 6 L in the ED.   He reports feeling more weak and short of breath with exertion.  Has been coughing with productive green sputum for the past 2 to 3 weeks.  Denies any fever.  No chest pain.  No current tobacco use.   CBC with leukocytosis of 10.6, hemoglobin 10.8.  Sodium of 138, K of 3.1, creatinine of 2.39 from prior around 6.  BG of 178. Troponin of 39 and a BNP of 3780.   CTA chest showed no central pulmonary artery embolism.  Nonocclusive thrombus versus scarring nonoccluded.  Small bilateral pleural effusion with large area of consolidation to the right lower lobe possible infiltrate versus atelectasis.  Underlying mass not excluded.  There is also occlusion of the bronchus intermedius with impacted mucus secretion versus aspiration content.  Dilated esophagus with ingested content which may predispose to aspiration.   He was started on IV Rocephin and azithromycin in the ED  Assessment & Plan:   Principal Problem:   Sepsis ([place]) Active Problems:   ESRD on dialysis [facility])   Hypothyroidism   Acute on chronic combined systolic and diastolic CHF (congestive heart failure) (HCC)   Hypokalemia  Acute on chronic hypoxemic respiratory failure secondary to aspiration pneumonia: Baseline on 2 L now requiring 4 L on admitted.  At the time of admission, he  was on 15 L. -CTA chest showing right lower lobe consolidation, impacted mucus versus aspiration content.  Patient has improved.  Continue Unasyn.  Follow cultures.  Please note that patient does not meet sepsis criteria as mentioned in HPI.  The only SIRS criteria he had was tachypnea, he does not have tachycardia, leukopenia, leukocytosis or fever.  ESRD on HD MWF: Nephrology on board.  Hypotension: Still hypotensive but patient is totally asymptomatic.  Perhaps this could be his baseline due to ESRD patient.  Monitor.  Chronic combined diastolic and systolic heart failure: Volume management by dialysis per  Hypokalemia: Low again.  Will replace.  Hypothyroidism: Continue Synthroid.   DVT prophylaxis: heparin injection 5,000 Units Start: 01/16/22 1400   Code Status: Full Code  Family Communication:  None present at bedside.  Plan of care discussed with patient in length and he/she verbalized understanding and agreed with it.  Status is: Inpatient Remains inpatient appropriate because: Still hypoxic.           Estimated body mass index is 20.54 kg/m as calculated from the following:   Height as of 12/28/21: 5\' 7"  (1.702 m).   Weight as of 01/01/22: 59.5 kg.    Nutritional Assessment: There is no height or weight on file to calculate BMI.. Seen by dietician.  I agree with the assessment and plan as outlined below: Nutrition Status:        . Skin Assessment: I have examined the patient's skin and I agree with the  wound assessment as performed by the wound care RN as outlined below:    Consultants:  Nephrology  Procedures:  None  Antimicrobials:  Anti-infectives (From admission, onward)    Start     Dose/Rate Route Frequency Ordered Stop   01/16/22 2200  cefTRIAXone (ROCEPHIN) 1 g in sodium chloride 0.9 % 100 mL IVPB  Status:  Discontinued        1 g 200 mL/hr over 30 Minutes Intravenous Every 24 hours 01/16/22 0134 01/16/22 0141   01/16/22 2200  azithromycin  (ZITHROMAX) 500 mg in sodium chloride 0.9 % 250 mL IVPB  Status:  Discontinued        500 mg 250 mL/hr over 60 Minutes Intravenous Every 24 hours 01/16/22 0134 01/16/22 0141   01/16/22 0300  ampicillin-sulbactam (UNASYN) 1.5 g in sodium chloride 0.9 % 100 mL IVPB        1.5 g 200 mL/hr over 30 Minutes Intravenous Every 12 hours 01/16/22 0212     01/15/22 2130  cefTRIAXone (ROCEPHIN) 1 g in sodium chloride 0.9 % 100 mL IVPB        1 g 200 mL/hr over 30 Minutes Intravenous  Once 01/15/22 2117 01/16/22 0115   01/15/22 2130  azithromycin (ZITHROMAX) 500 mg in sodium chloride 0.9 % 250 mL IVPB        500 mg 250 mL/hr over 60 Minutes Intravenous  Once 01/15/22 2117 01/16/22 0408         Subjective: Seen and examined, still in the ED.  He states that he feels better than before, breathing is improving.  He is fully alert and oriented.  Hard of hearing.  I confirmed with him and he wants to be full code.  Objective: Vitals:   01/16/22 0800 01/16/22 0845 01/16/22 0900 01/16/22 1000  BP: (!) 102/52  (!) 101/50 (!) 99/45  Pulse: 70 70 70 (!) 55  Resp: (!) 25 (!) 23 (!) 29 18  Temp:      TempSrc:      SpO2: 99% 97% 97% 100%   No intake or output data in the 24 hours ending 01/16/22 1009 There were no vitals filed for this visit.  Examination:  General exam: Appears calm and comfortable  Respiratory system: Diminished breath sounds with poor inspiratory effort.   Cardiovascular system: S1 & S2 heard, RRR. No JVD, murmurs, rubs, gallops or clicks. No pedal edema. Gastrointestinal system: Abdomen is nondistended, soft and nontender. No organomegaly or masses felt. Normal bowel sounds heard. Central nervous system: Alert and oriented. No focal neurological deficits. Extremities: Symmetric 5 x 5 power. Skin: No rashes, lesions or ulcers Psychiatry: Judgement and insight appear normal. Mood & affect appropriate.    Data Reviewed: I have personally reviewed following labs and imaging  studies  CBC: Recent Labs  Lab 01/15/22 1712 01/16/22 0146  WBC 10.6* 9.6  NEUTROABS 8.8*  --   HGB 10.8* 9.6*  HCT 35.6* 31.3*  MCV 108.9* 109.4*  PLT 164 277*   Basic Metabolic Panel: Recent Labs  Lab 01/15/22 1712 01/16/22 0146  NA 138 136  K 3.1* 3.3*  CL 96* 96*  CO2 31 28  GLUCOSE 178* 93  BUN 17 22  CREATININE 2.39* 2.71*  CALCIUM 7.7* 7.7*   GFR: CrCl cannot be calculated (Unknown ideal weight.). Liver Function Tests: No results for input(s): AST, ALT, ALKPHOS, BILITOT, PROT, ALBUMIN in the last 168 hours. No results for input(s): LIPASE, AMYLASE in the last 168 hours. No results for input(s): AMMONIA  in the last 168 hours. Coagulation Profile: No results for input(s): INR, PROTIME in the last 168 hours. Cardiac Enzymes: No results for input(s): CKTOTAL, CKMB, CKMBINDEX, TROPONINI in the last 168 hours. BNP (last 3 results) No results for input(s): PROBNP in the last 8760 hours. HbA1C: No results for input(s): HGBA1C in the last 72 hours. CBG: No results for input(s): GLUCAP in the last 168 hours. Lipid Profile: No results for input(s): CHOL, HDL, LDLCALC, TRIG, CHOLHDL, LDLDIRECT in the last 72 hours. Thyroid Function Tests: No results for input(s): TSH, T4TOTAL, FREET4, T3FREE, THYROIDAB in the last 72 hours. Anemia Panel: No results for input(s): VITAMINB12, FOLATE, FERRITIN, TIBC, IRON, RETICCTPCT in the last 72 hours. Sepsis Labs: Recent Labs  Lab 01/16/22 0332  LATICACIDVEN 1.3    Recent Results (from the past 240 hour(s))  Resp Panel by RT-PCR (Flu A&B, Covid) Nasopharyngeal Swab     Status: None   Collection Time: 01/15/22  4:36 PM   Specimen: Nasopharyngeal Swab; Nasopharyngeal(NP) swabs in vial transport medium  Result Value Ref Range Status   SARS Coronavirus 2 by RT PCR NEGATIVE NEGATIVE Final    Comment: (NOTE) SARS-CoV-2 target nucleic acids are NOT DETECTED.  The SARS-CoV-2 RNA is generally detectable in upper  respiratory specimens during the acute phase of infection. The lowest concentration of SARS-CoV-2 viral copies this assay can detect is 138 copies/mL. A negative result does not preclude SARS-Cov-2 infection and should not be used as the sole basis for treatment or other patient management decisions. A negative result may occur with  improper specimen collection/handling, submission of specimen other than nasopharyngeal swab, presence of viral mutation(s) within the areas targeted by this assay, and inadequate number of viral copies(<138 copies/mL). A negative result must be combined with clinical observations, patient history, and epidemiological information. The expected result is Negative.  Fact Sheet for Patients:  EntrepreneurPulse.com.au  Fact Sheet for Healthcare Providers:  IncredibleEmployment.be  This test is no t yet approved or cleared by the [place] FDA and  has been authorized for detection and/or diagnosis of SARS-CoV-2 by FDA under an Emergency Use Authorization (EUA). This EUA will remain  in effect (meaning this test can be used) for the duration of the COVID-19 declaration under Section 564(b)(1) of the Act, 21 U.S.C.section 360bbb-3(b)(1), unless the authorization is terminated  or revoked sooner.       Influenza A by PCR NEGATIVE NEGATIVE Final   Influenza B by PCR NEGATIVE NEGATIVE Final    Comment: (NOTE) The Xpert Xpress SARS-CoV-2/FLU/RSV plus assay is intended as an aid in the diagnosis of influenza from Nasopharyngeal swab specimens and should not be used as a sole basis for treatment. Nasal washings and aspirates are unacceptable for Xpert Xpress SARS-CoV-2/FLU/RSV testing.  Fact Sheet for Patients: EntrepreneurPulse.com.au  Fact Sheet for Healthcare Providers: IncredibleEmployment.be  This test is not yet approved or cleared by the [place] FDA and has been  authorized for detection and/or diagnosis of SARS-CoV-2 by FDA under an Emergency Use Authorization (EUA). This EUA will remain in effect (meaning this test can be used) for the duration of the COVID-19 declaration under Section 564(b)(1) of the Act, 21 U.S.C. section 360bbb-3(b)(1), unless the authorization is terminated or revoked.  Performed at [facility], [place] [address], [place], [place] [address]      Radiology Studies: CT Angio Chest PE W and/or Wo Contrast  Result Date: 01/15/2022 CLINICAL DATA:  Concern for pulmonary embolism. EXAM: CT ANGIOGRAPHY CHEST WITH CONTRAST TECHNIQUE:  Multidetector CT imaging of the chest was performed using the standard protocol during bolus administration of intravenous contrast. Multiplanar CT image reconstructions and MIPs were obtained to evaluate the vascular anatomy. RADIATION DOSE REDUCTION: This exam was performed according to the departmental dose-optimization program which includes automated exposure control, adjustment of the mA and/or kV according to patient size and/or use of iterative reconstruction technique. CONTRAST:  41mL OMNIPAQUE IOHEXOL 350 MG/ML SOLN COMPARISON:  Chest radiograph dated 01/15/2022. FINDINGS: Evaluation is limited due to streak artifact caused by patient's arms. Cardiovascular: Mild cardiomegaly. There is retrograde flow of contrast from the right atrium into the IVC suggestive of right heart dysfunction. No pericardial effusion. Advanced 3 vessel coronary vascular calcification. There is moderate atherosclerotic calcification of the thoracic aorta. No aneurysmal dilatation or dissection. There is dilatation of the main pulmonary trunk suggestive of pulmonary hypertension. Evaluation of the pulmonary arteries is limited due to respiratory motion artifact. No central pulmonary artery embolus identified. Faint low attenuating density in the subsegmental right lower lobe pulmonary artery branch (182/6) may be artifactual. A  nonocclusive thrombus or scarring is not excluded. Mediastinum/Nodes: No definite mediastinal adenopathy. Evaluation of the hilar lymph nodes is limited due to consolidative changes of the adjacent lungs. There is dilatation of the esophagus with ingested content which may represent delayed clearance or reflux. This can predispose to aspiration. No mediastinal fluid collection. Lungs/Pleura: Small bilateral pleural effusions. There is a large area of consolidation involving the right lower lobe which may represent atelectasis or infiltrate. Underlying mass is not excluded clinical correlation and follow-up recommended. There is partial compressive atelectasis of the right middle lobe and left lower lobe versus pneumonia. There is background of emphysema. No pneumothorax. There is occlusion of the bronchus intermedius with impacted mucus secretions versus aspiration content. Upper Abdomen: No acute abnormality. Musculoskeletal: Osteopenia degenerative changes of the spine. No acute osseous pathology. Bilateral gynecomastia. Review of the MIP images confirms the above findings. IMPRESSION: 1. No central pulmonary artery embolus identified. Faint low attenuating density in the subsegmental right lower lobe pulmonary artery branch may be artifactual. A nonocclusive thrombus or scarring is not excluded. 2. Small bilateral pleural effusions with a large area of consolidation involving the right lower lobe which may represent atelectasis or infiltrate. Underlying mass is not excluded. 3. Occlusion of the bronchus intermedius with impacted mucus secretions versus aspiration content. 4. Dilated esophagus with ingested content which may represent delayed clearance or reflux. This can predispose to aspiration. 5. Aortic Atherosclerosis (ICD10-I70.0) and Emphysema (ICD10-J43.9). Electronically Signed   By: Anner Crete M.D.   On: 01/15/2022 20:06   DG Chest Port 1 View  Result Date: 01/15/2022 CLINICAL DATA:  Shortness  of breath EXAM: PORTABLE CHEST 1 VIEW COMPARISON:  08/02/2021 FINDINGS: AP portable radiograph. Chin overlies the apices. Midline trachea. Mild cardiomegaly. Small left pleural effusion is unchanged. No pneumothorax. Mild pulmonary venous congestion. Left greater than right base airspace disease. IMPRESSION: 1. Cardiomegaly with mild pulmonary venous congestion. 2. Similar small left pleural effusion with adjacent airspace disease. 3. Probable atelectasis at the right lung base. Electronically Signed   By: Abigail Miyamoto M.D.   On: 01/15/2022 17:07    Scheduled Meds:  heparin  5,000 Units Subcutaneous Q8H   potassium chloride  20 mEq Oral Once   Continuous Infusions:  ampicillin-sulbactam (UNASYN) IV Stopped (01/16/22 0517)     LOS: 1 day   Time spent: 32 minutes  Darliss Cheney, MD [facility]  01/16/2022, 10:09 AM  Please page  via Amion and do not message via secure chat for urgent patient care matters. Secure chat can be used for non urgent patient care matters.  How to contact the [facility] Attending or Consulting provider [place] or covering provider during after hours [place], for this patient?  Check the care team in [facility] and look for a) attending/consulting TR[facility] provider listed and b) the [facility] team listed. Page or secure chat [phone]. Log into [email or web address] and use [place]'s universal password to access. If you do not have the password, please contact the hospital operator. Locate the [facility] provider you are looking for under [facility] and page to a number that you can be directly reached. If you still have difficulty reaching the provider, please page the [facility] (Director on Call) for the Hospitalists listed on amion for assistance.

## 2022-01-17 DIAGNOSIS — J9621 Acute and chronic respiratory failure with hypoxia: Secondary | ICD-10-CM

## 2022-01-17 DIAGNOSIS — J690 Pneumonitis due to inhalation of food and vomit: Secondary | ICD-10-CM

## 2022-01-17 LAB — CBC WITH DIFFERENTIAL/PLATELET
Abs Immature Granulocytes: 0.02 10*3/uL (ref 0.00–0.07)
Basophils Absolute: 0 10*3/uL (ref 0.0–0.1)
Basophils Relative: 1 %
Eosinophils Absolute: 0.7 10*3/uL — ABNORMAL HIGH (ref 0.0–0.5)
Eosinophils Relative: 9 %
HCT: 32.2 % — ABNORMAL LOW (ref 39.0–52.0)
Hemoglobin: 9.8 g/dL — ABNORMAL LOW (ref 13.0–17.0)
Immature Granulocytes: 0 %
Lymphocytes Relative: 9 %
Lymphs Abs: 0.7 10*3/uL (ref 0.7–4.0)
MCH: 32.7 pg (ref 26.0–34.0)
MCHC: 30.4 g/dL (ref 30.0–36.0)
MCV: 107.3 fL — ABNORMAL HIGH (ref 80.0–100.0)
Monocytes Absolute: 0.9 10*3/uL (ref 0.1–1.0)
Monocytes Relative: 12 %
Neutro Abs: 5.2 10*3/uL (ref 1.7–7.7)
Neutrophils Relative %: 69 %
Platelets: 148 10*3/uL — ABNORMAL LOW (ref 150–400)
RBC: 3 MIL/uL — ABNORMAL LOW (ref 4.22–5.81)
RDW: 15.9 % — ABNORMAL HIGH (ref 11.5–15.5)
WBC: 7.5 10*3/uL (ref 4.0–10.5)
nRBC: 0 % (ref 0.0–0.2)

## 2022-01-17 LAB — IRON AND TIBC
Iron: 40 ug/dL — ABNORMAL LOW (ref 45–182)
Saturation Ratios: 29 % (ref 17.9–39.5)
TIBC: 137 ug/dL — ABNORMAL LOW (ref 250–450)
UIBC: 97 ug/dL

## 2022-01-17 LAB — FERRITIN: Ferritin: 1336 ng/mL — ABNORMAL HIGH (ref 24–336)

## 2022-01-17 LAB — BASIC METABOLIC PANEL
Anion gap: 12 (ref 5–15)
BUN: 23 mg/dL (ref 8–23)
CO2: 28 mmol/L (ref 22–32)
Calcium: 8.5 mg/dL — ABNORMAL LOW (ref 8.9–10.3)
Chloride: 100 mmol/L (ref 98–111)
Creatinine, Ser: 3.77 mg/dL — ABNORMAL HIGH (ref 0.61–1.24)
GFR, Estimated: 14 mL/min — ABNORMAL LOW (ref >60)
Glucose, Bld: 82 mg/dL (ref 70–99)
Potassium: 4.7 mmol/L (ref 3.5–5.1)
Sodium: 140 mmol/L (ref 135–145)

## 2022-01-17 MED ORDER — MIDODRINE HCL 5 MG PO TABS: 5.0000 mg | ORAL_TABLET | Freq: Three times a day (TID) | ORAL | 0 refills | Status: AC

## 2022-01-17 MED ORDER — AMOXICILLIN-POT CLAVULANATE 500-125 MG PO TABS: 1.0000 | ORAL_TABLET | Freq: Two times a day (BID) | ORAL | 0 refills | Status: AC

## 2022-01-17 MED ORDER — PANTOPRAZOLE SODIUM 40 MG IV SOLR
40.0000 mg | Freq: Every day | INTRAVENOUS | Status: DC
  Administered 2022-01-17: 40 mg via INTRAVENOUS
  Filled 2022-01-17: qty 10

## 2022-01-17 NOTE — TOC Progression Note (Signed)
Transition of Care [facility]) - Progression Note    Patient Details  Name: Lonnie [name] MRN: [id number] Date of Birth: 14-Oct-1925  Transition of Care [facility]) CM/SW [place], RN Phone [phone]  01/17/2022, 9:24 AM  Clinical Narrative:    TOC following for patient with high risk for re admission. Patient has PT/OT home health recommendations. Patient is active with [facility] formally known [facility]. Cm spoke with Hoyle Sauer with [facility] to verify that patient is active and services will resume upon discharge. MD will need to enter [facility] orders.         Expected Discharge Plan and Services                                                 Social Determinants of Health (SDOH) Interventions    Readmission Risk Interventions No flowsheet data found.

## 2022-01-17 NOTE — Discharge Summary (Addendum)
PatientPhysician Discharge Summary  Lonnie [name] QBH:[id number] DOB: 1925/08/15 DOA: 01/15/2022  PCP: Prince Solian, MD  Admit date: 01/15/2022 Discharge date: 01/17/2022 30 Day Unplanned Readmission Risk Score    Flowsheet Row ED to Hosp-Admission (Current) from 01/15/2022 in [facility]  30 Day Unplanned Readmission Risk Score (%) 24.59 Filed at 01/17/2022 1200       This score is the patient's risk of an unplanned readmission within 30 days of being discharged (0 -100%). The score is based on dignosis, age, lab data, medications, orders, and past utilization.   Low:  0-14.9   Medium: 15-21.9   High: 22-29.9   Extreme: 30 and above          Admitted From: Home Disposition: Home  Recommendations for Outpatient Follow-up:  Follow up with PCP in 1-2 weeks Please obtain BMP/CBC in one week Please follow up with your PCP on the following pending results: Unresulted Labs (From admission, onward)     Start     Ordered   01/17/22 0806  Ferritin  Once,   R       Question:  Specimen collection method  Answer:  Lab=Lab collect   01/17/22 0805   01/16/22 1014  Strep pneumoniae urinary antigen  Once,   R        01/16/22 1013   01/16/22 1014  Expectorated Sputum Assessment w Gram Stain, Rflx to Resp Cult  Once,   R        01/16/22 1013   01/16/22 1013  Legionella Pneumophila Serogp 1 Ur Ag  Once,   R        01/16/22 1013   01/15/22 2118  Blood culture (routine x 2)  BLOOD CULTURE X 2,   STAT      01/15/22 2117   Signed and Held  Renal function panel  Once,   R       Question:  Specimen collection method  Answer:  Lab=Lab collect   Signed and Held   Signed and Held  CBC  Once,   R       Question:  Specimen collection method  Answer:  Lab=Lab collect   Signed and Held   Signed and Held  Hepatitis B surface antigen  (New Admission [facility] (Hepatitis B))  Once,   R       Question:  Specimen collection method  Answer:  Lab=Lab collect   Signed and Held    Signed and Held  Hepatitis B surface antibody  (New Admission [facility] Labs (Hepatitis B))  Once,   R       Question:  Specimen collection method  Answer:  Lab=Lab collect   Signed and Held   Signed and Held  Hepatitis B surface antibody,quantitative  (New Admission [facility] (Hepatitis B))  Once,   R       Question:  Specimen collection method  Answer:  Lab=Lab collect   Signed and Held              Home Health: Yes Equipment/Devices: None  Discharge Condition: Stable CODE STATUS: Full code Diet recommendation: Cardiac/regular with thin liquid.  Subjective: Seen and examined.  He is fully alert and oriented and he feels good and he is agreeable with the discharge plan today.  Brief/Interim Summary: Lonnie [name] is a 88 y.o. male with medical history significant of ESRD on HD MWF, chronic hypoxemia on 2 L [place], combined diastolic and systolic CHF, hypertension, peripheral vascular disease,  GI bleed, hypertension and hypothyroidism who presented with concerns of increasing shortness of breath.   Patient completed a session of dialysis on the day of admission, reported increasing weakness.  Per EMS report, he was found to be hypoxic down to 60% on his 2 L.  Initially placed on nonrebreather 15 L and was able to wean down to 6 L in the ED.   CBC with leukocytosis of 10.6, hemoglobin 10.8.  Sodium of 138, K of 3.1, creatinine of 2.39 from prior around 6.  BG of 178. Troponin of 39 and a BNP of 3780.   CTA chest showed no central pulmonary artery embolism.  Nonocclusive thrombus versus scarring nonoccluded.  Small bilateral pleural effusion with large area of consolidation to the right lower lobe possible infiltrate versus atelectasis.  Underlying mass not excluded.  There is also occlusion of the bronchus intermedius with impacted mucus secretion versus aspiration content.  Dilated esophagus with ingested content which may predispose to aspiration. He was started on IV Rocephin and  azithromycin in the ED, admitted under hospitalist service with following conditions and management in detail.   Acute on chronic hypoxemic respiratory failure secondary to aspiration pneumonia: Baseline on 2 L.  At the time of admission, he was on 15 L.  He is now weaned down to 2 L, his baseline.  He has been continued on IV Unasyn.  He was assessed by PT OT who recommended home with home health.  Patient is fully alert and oriented and he agrees with the plan of discharge today.  I am discharging him on 5 more days of oral Augmentin.  He was assessed by SLP.  He is considered to be mild aspiration risk.  They recommended regular with thin liquid.  Per SLP, patient had MBS a week ago and was found to have a prominent cricopharyngeus muscle and suspected Zenkers diverticulum which is going to put him at somewhat of a chronic risk for aspiration please note that patient does not meet sepsis criteria as mentioned in HPI.  The only SIRS criteria he had was tachypnea, he does not have tachycardia, leukopenia, leukocytosis or fever.  Sepsis ruled out.  ESRD on HD MWF: He is going to get his dialysis today in the afternoon before discharge.   Hypotension: Was hypotensive upon admission.  Despite of this, patient remained asymptomatic.  It was later found out by nephrology that he was started on midodrine as outpatient which was not resumed here.  After this discovery, he was started on midodrine and his blood pressure is fairly normal now.   Chronic combined diastolic and systolic heart failure: Appears euvolemic.  Volume management by dialysis per   Hypokalemia: Resolved\   Hypothyroidism: Continue Synthroid.  Discharge plan was discussed with patient and/or family member and they verbalized understanding and agreed with it.  Discharge Diagnoses:  Principal Problem:   Sepsis ([place]) Active Problems:   ESRD on dialysis [facility])   Hypothyroidism   Hypokalemia   Acute on chronic respiratory failure with  hypoxia (HCC)   Aspiration pneumonia ([place])    Discharge Instructions   Allergies as of 01/17/2022   No Known Allergies      Medication List     TAKE these medications    acetaminophen 325 MG tablet Commonly known as: Tylenol Take 2 tablets (650 mg total) by mouth every 6 (six) hours as needed for mild pain.   amoxicillin-clavulanate 500-125 MG tablet Commonly known as: Augmentin Take 1 tablet (500 mg total) by  mouth 2 (two) times daily for 5 days.   calcium acetate 667 MG capsule Commonly known as: PHOSLO Take 1 capsule by mouth 3 (three) times daily with meals.   diclofenac Sodium 1 % Gel Commonly known as: Voltaren Apply 4 g topically 4 (four) times daily.   diphenhydramine-acetaminophen 25-500 MG Tabs tablet Commonly known as: TYLENOL PM Take 1 tablet by mouth at bedtime as needed (sleep).   donepezil 10 MG tablet Commonly known as: ARICEPT Take 10 mg by mouth every evening.   levothyroxine 112 MCG tablet Commonly known as: SYNTHROID Take 1 tablet (112 mcg total) by mouth every morning.   loratadine 10 MG tablet Commonly known as: CLARITIN Take 10 mg by mouth daily.   mirtazapine 15 MG tablet Commonly known as: REMERON Take 15 mg by mouth at bedtime.   multivitamin Tabs tablet Take 1 tablet by mouth daily.   multivitamin Tabs tablet Take 1 tablet by mouth daily.   pantoprazole 20 MG tablet Commonly known as: PROTONIX Take 1 tablet (20 mg total) by mouth daily.   polyethylene glycol 17 g packet Commonly known as: MiraLax Take 17 g by mouth daily.   polyvinyl alcohol 1.4 % ophthalmic solution Commonly known as: LIQUIFILM TEARS Place 1-2 drops into both eyes as needed for dry eyes.   traMADol 50 MG tablet Commonly known as: ULTRAM Take 50 mg by mouth every 12 (twelve) hours as needed.   VITAMIN B-12 PO Take 1 tablet by mouth daily.        Follow-up Information     Lonnie [name], Ravisankar, MD Follow up in 1 week(s).   Specialty: Internal  Medicine Contact information: [place] [address] [phone]         Lonnie Rile, MD .   Specialties: Cardiology, Internal Medicine, Radiology Contact information: [address] [place] [address] [phone]                No Known Allergies  Consultations: None   Procedures/Studies: CT Angio Chest PE W and/or Wo Contrast  Result Date: 01/15/2022 CLINICAL DATA:  Concern for pulmonary embolism. EXAM: CT ANGIOGRAPHY CHEST WITH CONTRAST TECHNIQUE: Multidetector CT imaging of the chest was performed using the standard protocol during bolus administration of intravenous contrast. Multiplanar CT image reconstructions and MIPs were obtained to evaluate the vascular anatomy. RADIATION DOSE REDUCTION: This exam was performed according to the departmental dose-optimization program which includes automated exposure control, adjustment of the mA and/or kV according to patient size and/or use of iterative reconstruction technique. CONTRAST:  45mL OMNIPAQUE IOHEXOL 350 MG/ML SOLN COMPARISON:  Chest radiograph dated 01/15/2022. FINDINGS: Evaluation is limited due to streak artifact caused by patient's arms. Cardiovascular: Mild cardiomegaly. There is retrograde flow of contrast from the right atrium into the IVC suggestive of right heart dysfunction. No pericardial effusion. Advanced 3 vessel coronary vascular calcification. There is moderate atherosclerotic calcification of the thoracic aorta. No aneurysmal dilatation or dissection. There is dilatation of the main pulmonary trunk suggestive of pulmonary hypertension. Evaluation of the pulmonary arteries is limited due to respiratory motion artifact. No central pulmonary artery embolus identified. Faint low attenuating density in the subsegmental right lower lobe pulmonary artery branch (182/6) may be artifactual. A nonocclusive thrombus or scarring is not excluded. Mediastinum/Nodes: No definite mediastinal  adenopathy. Evaluation of the hilar lymph nodes is limited due to consolidative changes of the adjacent lungs. There is dilatation of the esophagus with ingested content which may represent delayed clearance or reflux. This can  predispose to aspiration. No mediastinal fluid collection. Lungs/Pleura: Small bilateral pleural effusions. There is a large area of consolidation involving the right lower lobe which may represent atelectasis or infiltrate. Underlying mass is not excluded clinical correlation and follow-up recommended. There is partial compressive atelectasis of the right middle lobe and left lower lobe versus pneumonia. There is background of emphysema. No pneumothorax. There is occlusion of the bronchus intermedius with impacted mucus secretions versus aspiration content. Upper Abdomen: No acute abnormality. Musculoskeletal: Osteopenia degenerative changes of the spine. No acute osseous pathology. Bilateral gynecomastia. Review of the MIP images confirms the above findings. IMPRESSION: 1. No central pulmonary artery embolus identified. Faint low attenuating density in the subsegmental right lower lobe pulmonary artery branch may be artifactual. A nonocclusive thrombus or scarring is not excluded. 2. Small bilateral pleural effusions with a large area of consolidation involving the right lower lobe which may represent atelectasis or infiltrate. Underlying mass is not excluded. 3. Occlusion of the bronchus intermedius with impacted mucus secretions versus aspiration content. 4. Dilated esophagus with ingested content which may represent delayed clearance or reflux. This can predispose to aspiration. 5. Aortic Atherosclerosis (ICD10-I70.0) and Emphysema (ICD10-J43.9). Electronically Signed   By: Anner Crete M.D.   On: 01/15/2022 20:06   DG SWALLOW FUNC OP MEDICARE SPEECH PATH  Result Date: 01/09/2022 Table formatting from the original result was not included. Objective Swallowing Evaluation: Type of  Study: MBS-Modified Barium Swallow Study  Patient Details Name: BERNELL SIGAL MRN: [id number] Date of Birth: [birth year] Today's Date: 01/09/2022 Time: SLP Start Time (ACUTE ONLY): 48 -SLP Stop Time (ACUTE ONLY): 1340 SLP Time Calculation (min) (ACUTE ONLY): 20 min Past Medical History: Past Medical History: Diagnosis Date  Adenomatous polyps   Bursitis   left hip  Chronic kidney disease   Stage 4  Frequent urination at night   GERD (gastroesophageal reflux disease)   Hypertension   Hypothyroidism   Pneumonia   Polyclonal gammopathy determined by serum protein electrophoresis 10/04/2015 Past Surgical History: Past Surgical History: Procedure Laterality Date  AORTIC ARCH ANGIOGRAPHY N/A 08/01/2017  Procedure: Aortic Arch Angiography;  Surgeon: Conrad [place], MD;  Location: [place] [facility];  Service: Cardiovascular;  Laterality: N/A;  AV FISTULA PLACEMENT Left 07/10/2016  Procedure: LEFT BRACHIOCEPHALIC ARTERIOVENOUS (AV) FISTULA CREATION;  Surgeon: Conrad [place], MD;  Location: [facility];  Service: Vascular;  Laterality: Left;  COLONOSCOPY    RENAL BIOPSY Left 10/06/2015  REVISON OF ARTERIOVENOUS FISTULA Left 05/26/7627  Procedure: PLICATION OF LEFT BRACHIOCEPHALIC ARTERIOVENOUS FISTULA;  Surgeon: Conrad Gifford, MD;  Location: [facility];  Service: Vascular;  Laterality: Left;  SKIN SURGERY    back  UPPER EXTREMITY ANGIOGRAPHY Left 08/01/2017  Procedure: Upper Extremity Angiography;  Surgeon: Conrad [place], MD;  Location: [place] [facility];  Service: Cardiovascular;  Laterality: Left; HPI: Ugo Thoma is a 88 y.o. male who was recently admitted at [facility] (12/28/21-01/02/22) for suspected acute upper GI bleed after presenting from home to [facility] ED with maroon colored stool. GI consulted and did not find a recurrent GI bleed. PMH: GERD, ESRD on HD, HTN, PNA, CKD. He presented to this outpatient MBS for what he said was "getting strangled sometimes when eating".  Subjective: pleasant, HOH  Recommendations for follow up therapy are  one component of a multi-disciplinary discharge planning process, led by the attending physician.  Recommendations may be updated based on patient status, additional functional criteria and insurance authorization. Assessment / Plan / Recommendation Clinical Impressions 01/09/2022 Clinical  Impression Patient presents with a mild oropharyngeal dysphagia and a moderate cervical esophageal dysphagia with appearance of prominent cricopharyngeal bar as well as suspected Zenker's Diverticulum above cricopharyngeal bar. With all tested consistencies (thin liquids, puree solids, regular solids) patient exhibited mild vallecular residuals, trace posterior pharyngeal wall residuals, mild-moderate retention of barium in diverticulum/outpouching and trace to mild residuals in pyriform sinus. Vallecular and pharyngeal residuals would clear with subsequent swallows. One instance of flash penetration (PAS 2) with cup sip of thin liquids but no instances of aspiration. View of diverticulum was obstructed by shoulders but during swallow, was able to be visualized better. There did appear to be some clearing of diverticulum but it remained with mild-moderate amount of barium contrast. SLP is recommending patient continue with current PO diet and follow precautions for GERD. SLP Visit Diagnosis -- Attention and concentration deficit following -- Frontal lobe and executive function deficit following -- Impact on safety and function --   No flowsheet data found.  No flowsheet data found. Diet Recommendations 01/09/2022 SLP Diet Recommendations Dysphagia 3 (Mech soft) solids;Thin liquid Liquid Administration via Cup;Straw Medication Administration Crushed with puree Compensations Follow solids with liquid;Small sips/bites Postural Changes --   Other Recommendations 01/09/2022 Recommended Consults -- Oral Care Recommendations -- Other Recommendations -- Follow Up Recommendations No SLP follow up Assistance recommended at discharge -- Functional  Status Assessment -- No flowsheet data found.  Oral Phase 01/09/2022 Oral Phase Impaired Oral - Pudding Teaspoon -- Oral - Pudding Cup -- Oral - Honey Teaspoon -- Oral - Honey Cup -- Oral - Nectar Teaspoon -- Oral - Nectar Cup -- Oral - Nectar Straw -- Oral - Thin Teaspoon -- Oral - Thin Cup Weak lingual manipulation;Premature spillage Oral - Thin Straw Premature spillage;Weak lingual manipulation Oral - Puree Weak lingual manipulation;Delayed oral transit Oral - Mech Soft -- Oral - Regular Impaired mastication;Weak lingual manipulation;Delayed oral transit Oral - Multi-Consistency -- Oral - Pill -- Oral Phase - Comment --  Pharyngeal Phase 01/09/2022 Pharyngeal Phase Impaired Pharyngeal- Pudding Teaspoon -- Pharyngeal -- Pharyngeal- Pudding Cup -- Pharyngeal -- Pharyngeal- Honey Teaspoon -- Pharyngeal -- Pharyngeal- Honey Cup -- Pharyngeal -- Pharyngeal- Nectar Teaspoon -- Pharyngeal -- Pharyngeal- Nectar Cup -- Pharyngeal -- Pharyngeal- Nectar Straw -- Pharyngeal -- Pharyngeal- Thin Teaspoon -- Pharyngeal -- Pharyngeal- Thin Cup Pharyngeal residue - valleculae;Pharyngeal residue - pyriform;Pharyngeal residue - posterior pharnyx;Pharyngeal residue - cp segment;Reduced airway/laryngeal closure;Penetration/Aspiration during swallow Pharyngeal Material enters airway, remains ABOVE vocal cords then ejected out Pharyngeal- Thin Straw Pharyngeal residue - pyriform;Pharyngeal residue - valleculae;Pharyngeal residue - posterior pharnyx;Pharyngeal residue - cp segment Pharyngeal -- Pharyngeal- Puree Pharyngeal residue - valleculae;Reduced pharyngeal peristalsis;Pharyngeal residue - pyriform;Pharyngeal residue - posterior pharnyx;Pharyngeal residue - cp segment Pharyngeal -- Pharyngeal- Mechanical Soft -- Pharyngeal -- Pharyngeal- Regular Reduced pharyngeal peristalsis;Pharyngeal residue - valleculae;Pharyngeal residue - pyriform;Pharyngeal residue - posterior pharnyx;Pharyngeal residue - cp segment Pharyngeal -- Pharyngeal-  Multi-consistency -- Pharyngeal -- Pharyngeal- Pill -- Pharyngeal -- Pharyngeal Comment --  Cervical Esophageal Phase  01/09/2022 Cervical Esophageal Phase Impaired Pudding Teaspoon -- Pudding Cup -- Honey Teaspoon -- Honey Cup -- Nectar Teaspoon -- Nectar Cup -- Nectar Straw -- Thin Teaspoon -- Thin Cup Prominent cricopharyngeal segment Thin Straw Prominent cricopharyngeal segment Puree Prominent cricopharyngeal segment Mechanical Soft -- Regular Prominent cricopharyngeal segment Multi-consistency -- Pill -- Cervical Esophageal Comment presence of what appears to be Zenker's diverticulum on top of cricopharyngeal bar resulting in retention of all boluses with eventual clearing of some but not all. Difficult to visualize fully  secondary to patient's shoulder's obscuring view. Sonia Baller, MA, CCC-SLP Speech Therapy            CLINICAL DATA:  Dysphagia EXAM: MODIFIED BARIUM SWALLOW TECHNIQUE: Different consistencies of barium were administered orally to the patient by the Speech Pathologist. Imaging of the pharynx was performed in the lateral projection. The radiologist was present in the fluoroscopy room for this study, providing personal supervision. FLUOROSCOPY: Fluoroscopy Time:  2 minutes 24 seconds Radiation Exposure Index (if provided by the fluoroscopic device): 8.2 mGy Number of Acquired Spot Images: 0 COMPARISON:  None FINDINGS: Spinal degenerative changes noted on lateral view. Osteopenia. No signs of penetration or aspiration with some stasis in the valleculae and Piriforms. Small CP bar also with small Zenker's diverticulum incidentally noted. Zenker's diverticulum up to 8 mm. Remainder of the esophagus not assessed. IMPRESSION: No signs of penetration or aspiration with small Zenker's diverticulum. This exam was performed by Narda Rutherford, and was supervised and interpreted by Zetta Bills Please refer to the Speech Pathologists report for complete details and recommendations. Electronically Signed    By: Zetta Bills M.D.   On: 01/09/2022 14:26   DG Chest Port 1 View  Result Date: 01/15/2022 CLINICAL DATA:  Shortness of breath EXAM: PORTABLE CHEST 1 VIEW COMPARISON:  08/02/2021 FINDINGS: AP portable radiograph. Chin overlies the apices. Midline trachea. Mild cardiomegaly. Small left pleural effusion is unchanged. No pneumothorax. Mild pulmonary venous congestion. Left greater than right base airspace disease. IMPRESSION: 1. Cardiomegaly with mild pulmonary venous congestion. 2. Similar small left pleural effusion with adjacent airspace disease. 3. Probable atelectasis at the right lung base. Electronically Signed   By: Abigail Miyamoto M.D.   On: 01/15/2022 17:07   CT ANGIO GI BLEED  Result Date: 12/28/2021 CLINICAL DATA:  Evaluate for mesenteric ischemia and diverticular bleeding. EXAM: CTA ABDOMEN AND PELVIS WITHOUT AND WITH CONTRAST TECHNIQUE: Multidetector CT imaging of the abdomen and pelvis was performed using the standard protocol during bolus administration of intravenous contrast. Multiplanar reconstructed images and MIPs were obtained and reviewed to evaluate the vascular anatomy. RADIATION DOSE REDUCTION: This exam was performed according to the departmental dose-optimization program which includes automated exposure control, adjustment of the mA and/or kV according to patient size and/or use of iterative reconstruction technique. CONTRAST:  67mL OMNIPAQUE IOHEXOL 350 MG/ML SOLN COMPARISON:  None. FINDINGS: VASCULAR Aorta: Diffuse atherosclerotic calcifications in the abdominal aorta without aneurysm, dissection or significant stenosis. Aorta at the level celiac trunk measures 2.8 cm. Celiac: Mild-to-moderate stenosis at the origin of the celiac trunk due to calcified plaque. There is ectasia or poststenotic dilatation of the celiac trunk measuring up to 1.1 cm. Splenic artery is heavily calcified. Common hepatic artery is patent. SMA: Calcified plaque at the origin of the SMA without  significant stenosis. Areas of mild narrowing in the main SMA. Diffuse atherosclerotic disease in the SMA branches. No evidence for acute thrombus. Renals: Atherosclerotic disease involving bilateral renal arteries. Prominent noncalcified plaque in the mid left renal artery could be causing moderate to severe stenosis in this area. Early branching of the right renal artery with areas of stenosis and atherosclerotic disease beyond the branch point. No evidence for aneurysm or dissections involving the renal arteries. IMA: Patent Inflow: Common, internal and external iliac arteries are patent bilaterally with diffuse atherosclerotic calcifications. Mild stenosis near the origin of the left internal iliac artery. Proximal Outflow: Proximal femoral arteries are patent bilaterally. Veins: Normal appearance the IVC and iliac veins. Main portal venous system  is patent. Review of the MIP images confirms the above findings. NON-VASCULAR Lower chest: Bilateral pleural effusions. Pleural effusions are small in size. Compressive atelectasis or consolidation in the right lower lobe. Pleural-based densities in the right middle lobe are nonspecific. 4 mm nodule in the left lower lobe on sequence 17 image 17 is probably calcified and suggestive for a calcified granuloma. Suspect underlying emphysema. Enhancement or mild pleural thickening associated with these pleural effusions. Hepatobiliary: Normal appearance of the liver. Mild gallbladder distension without inflammatory changes. Pancreas: Round low-density structure at the pancreatic head on sequence 7 image 72 measures 1.2 cm. This is suggestive for a small cystic lesion. No evidence for pancreatic duct dilatation or pancreatic inflammation. Spleen: Normal in size without focal abnormality. Adrenals/Urinary Tract: Adrenal glands are within the normal limits. Both kidneys are very atrophic. Multiple bilateral renal cysts. Negative for left hydronephrosis. There is fullness of  the right renal pelvis without significant right caliceal dilatation. Ureters are not dilated. Bladder is decompressed but there is a diffuse bladder wall thickening which could be associated with under distension. Stomach/Bowel: Diverticula in the sigmoid colon without acute inflammation. No evidence for active GI bleeding. Normal appearance of the stomach. No bowel distention or obstruction. Lymphatic: No lymph node enlargement in the abdomen or pelvis. Reproductive: Prostate is unremarkable. Other: Negative for ascites or free air. Mild mesenteric stranding is nonspecific. Ventral hernia near the midline containing fat. Mild subcutaneous edema. Musculoskeletal: Multilevel disc space narrowing in lumbar spine. Mild anterolisthesis of L4 on L5. IMPRESSION: VASCULAR 1. No evidence for active GI bleeding. 2. Diffuse atherosclerotic disease in the abdomen and pelvis. Stenosis at the origin of the celiac trunk and bilateral renal arteries. 3.  Aortic Atherosclerosis (ICD10-I70.0). NON-VASCULAR 1. Colonic diverticulosis without acute bowel inflammation. 2. Bilateral pleural effusions with mild pleural thickening bilaterally. Extensive volume loss in the right lower lobe of uncertain etiology or chronicity. In addition, there is irregular pleural-based thickening along the right middle lobe. These pleural effusions are small in size. There may be underlying emphysema. These chest findings could be better evaluated with a dedicated chest CT. 3. Bilateral renal atrophy with bilateral renal cysts. Dilatation of the right renal pelvis and suspect this is a chronic finding. 4. 1.2 cm low-density structure near the pancreatic head. This is suggestive for a small pancreatic cystic lesion. Consider follow-up imaging in 2 years. Electronically Signed   By: Markus Daft M.D.   On: 12/28/2021 13:41     Discharge Exam: Vitals:   01/17/22 0732 01/17/22 1106  BP: (!) 114/53 (!) 114/53  Pulse: 68 67  Resp: 20 (!) 26  Temp: 97.6  F (36.4 C) 98.4 F (36.9 C)  SpO2: 100% 97%   Vitals:   01/16/22 2255 01/17/22 0310 01/17/22 0732 01/17/22 1106  BP: (!) 113/54 (!) 102/52 (!) 114/53 (!) 114/53  Pulse: 67 67 68 67  Resp: 17 20 20  (!) 26  Temp: (!) 97.5 F (36.4 C) 97.7 F (36.5 C) 97.6 F (36.4 C) 98.4 F (36.9 C)  TempSrc: Oral Oral Oral Axillary  SpO2: 100% 100% 100% 97%    General: Pt is alert, awake, not in acute distress Cardiovascular: RRR, S1/S2 +, no rubs, no gallops Respiratory: CTA bilaterally, no wheezing, no rhonchi Abdominal: Soft, NT, ND, bowel sounds + Extremities: no edema, no cyanosis    The results of significant diagnostics from this hospitalization (including imaging, microbiology, ancillary and laboratory) are listed below for reference.     Microbiology: Recent Results (from  the past 240 hour(s))  Resp Panel by RT-PCR (Flu A&B, Covid) Nasopharyngeal Swab     Status: None   Collection Time: 01/15/22  4:36 PM   Specimen: Nasopharyngeal Swab; Nasopharyngeal(NP) swabs in vial transport medium  Result Value Ref Range Status   SARS Coronavirus 2 by RT PCR NEGATIVE NEGATIVE Final    Comment: (NOTE) SARS-CoV-2 target nucleic acids are NOT DETECTED.  The SARS-CoV-2 RNA is generally detectable in upper respiratory specimens during the acute phase of infection. The lowest concentration of SARS-CoV-2 viral copies this assay can detect is 138 copies/mL. A negative result does not preclude SARS-Cov-2 infection and should not be used as the sole basis for treatment or other patient management decisions. A negative result may occur with  improper specimen collection/handling, submission of specimen other than nasopharyngeal swab, presence of viral mutation(s) within the areas targeted by this assay, and inadequate number of viral copies(<138 copies/mL). A negative result must be combined with clinical observations, patient history, and epidemiological information. The expected result is  Negative.  Fact Sheet for Patients:  EntrepreneurPulse.com.au  Fact Sheet for Healthcare Providers:  IncredibleEmployment.be  This test is no t yet approved or cleared by the [place] FDA and  has been authorized for detection and/or diagnosis of SARS-CoV-2 by FDA under an Emergency Use Authorization (EUA). This EUA will remain  in effect (meaning this test can be used) for the duration of the COVID-19 declaration under Section 564(b)(1) of the Act, 21 U.S.C.section 360bbb-3(b)(1), unless the authorization is terminated  or revoked sooner.       Influenza A by PCR NEGATIVE NEGATIVE Final   Influenza B by PCR NEGATIVE NEGATIVE Final    Comment: (NOTE) The Xpert Xpress SARS-CoV-2/FLU/RSV plus assay is intended as an aid in the diagnosis of influenza from Nasopharyngeal swab specimens and should not be used as a sole basis for treatment. Nasal washings and aspirates are unacceptable for Xpert Xpress SARS-CoV-2/FLU/RSV testing.  Fact Sheet for Patients: EntrepreneurPulse.com.au  Fact Sheet for Healthcare Providers: IncredibleEmployment.be  This test is not yet approved or cleared by the [place] FDA and has been authorized for detection and/or diagnosis of SARS-CoV-2 by FDA under an Emergency Use Authorization (EUA). This EUA will remain in effect (meaning this test can be used) for the duration of the COVID-19 declaration under Section 564(b)(1) of the Act, 21 U.S.C. section 360bbb-3(b)(1), unless the authorization is terminated or revoked.  Performed at [facility], [place] [address], [place], [place] [address]   Blood culture (routine x 2)     Status: None (Preliminary result)   Collection Time: 01/16/22 12:20 AM   Specimen: BLOOD  Result Value Ref Range Status   Specimen Description BLOOD SITE NOT SPECIFIED  Final   Special Requests   Final    BOTTLES DRAWN AEROBIC AND ANAEROBIC Blood  Culture results may not be optimal due to an inadequate volume of blood received in culture bottles   Culture   Final    NO GROWTH 1 DAY Performed at [facility], [place] [address], [place], [place] [address]    Report Status PENDING  Incomplete     Labs: BNP (last 3 results) Recent Labs    08/02/21 1134 01/15/22 1712  BNP >4,500.0* 1,517.6*   Basic Metabolic Panel: Recent Labs  Lab 01/15/22 1712 01/16/22 0146 01/17/22 0112  NA 138 136 140  K 3.1* 3.3* 4.7  CL 96* 96* 100  CO2 31 28 28   GLUCOSE 178* 93  82  BUN 17 22 23   CREATININE 2.39* 2.71* 3.77*  CALCIUM 7.7* 7.7* 8.5*   Liver Function Tests: No results for input(s): AST, ALT, ALKPHOS, BILITOT, PROT, ALBUMIN in the last 168 hours. No results for input(s): LIPASE, AMYLASE in the last 168 hours. No results for input(s): AMMONIA in the last 168 hours. CBC: Recent Labs  Lab 01/15/22 1712 01/16/22 0146 01/17/22 0112  WBC 10.6* 9.6 7.5  NEUTROABS 8.8*  --  5.2  HGB 10.8* 9.6* 9.8*  HCT 35.6* 31.3* 32.2*  MCV 108.9* 109.4* 107.3*  PLT 164 147* 148*   Cardiac Enzymes: No results for input(s): CKTOTAL, CKMB, CKMBINDEX, TROPONINI in the last 168 hours. BNP: Invalid input(s): POCBNP CBG: No results for input(s): GLUCAP in the last 168 hours. D-Dimer No results for input(s): DDIMER in the last 72 hours. Hgb A1c No results for input(s): HGBA1C in the last 72 hours. Lipid Profile No results for input(s): CHOL, HDL, LDLCALC, TRIG, CHOLHDL, LDLDIRECT in the last 72 hours. Thyroid function studies No results for input(s): TSH, T4TOTAL, T3FREE, THYROIDAB in the last 72 hours.  Invalid input(s): FREET3 Anemia work up Recent Labs    01/17/22 0831  TIBC 137*  IRON 40*   Urinalysis    Component Value Date/Time   COLORURINE YELLOW 03/19/2016 1827   APPEARANCEUR CLEAR 03/19/2016 1827   LABSPEC 1.020 11/02/2020 1826   PHURINE 8.5 (H) 11/02/2020 1826   GLUCOSEU NEGATIVE 11/02/2020 1826   HGBUR TRACE (A)  11/02/2020 1826   BILIRUBINUR NEGATIVE 11/02/2020 1826   KETONESUR NEGATIVE 11/02/2020 1826   PROTEINUR >=300 (A) 11/02/2020 1826   UROBILINOGEN 0.2 11/02/2020 1826   NITRITE NEGATIVE 11/02/2020 1826   LEUKOCYTESUR NEGATIVE 11/02/2020 1826   Sepsis Labs Invalid input(s): PROCALCITONIN,  WBC,  LACTICIDVEN Microbiology Recent Results (from the past 240 hour(s))  Resp Panel by RT-PCR (Flu A&B, Covid) Nasopharyngeal Swab     Status: None   Collection Time: 01/15/22  4:36 PM   Specimen: Nasopharyngeal Swab; Nasopharyngeal(NP) swabs in vial transport medium  Result Value Ref Range Status   SARS Coronavirus 2 by RT PCR NEGATIVE NEGATIVE Final    Comment: (NOTE) SARS-CoV-2 target nucleic acids are NOT DETECTED.  The SARS-CoV-2 RNA is generally detectable in upper respiratory specimens during the acute phase of infection. The lowest concentration of SARS-CoV-2 viral copies this assay can detect is 138 copies/mL. A negative result does not preclude SARS-Cov-2 infection and should not be used as the sole basis for treatment or other patient management decisions. A negative result may occur with  improper specimen collection/handling, submission of specimen other than nasopharyngeal swab, presence of viral mutation(s) within the areas targeted by this assay, and inadequate number of viral copies(<138 copies/mL). A negative result must be combined with clinical observations, patient history, and epidemiological information. The expected result is Negative.  Fact Sheet for Patients:  EntrepreneurPulse.com.au  Fact Sheet for Healthcare Providers:  IncredibleEmployment.be  This test is no t yet approved or cleared by the [place] FDA and  has been authorized for detection and/or diagnosis of SARS-CoV-2 by FDA under an Emergency Use Authorization (EUA). This EUA will remain  in effect (meaning this test can be used) for the duration of the COVID-19  declaration under Section 564(b)(1) of the Act, 21 U.S.C.section 360bbb-3(b)(1), unless the authorization is terminated  or revoked sooner.       Influenza A by PCR NEGATIVE NEGATIVE Final   Influenza B by PCR NEGATIVE NEGATIVE Final    Comment: (NOTE)  The Xpert Xpress SARS-CoV-2/FLU/RSV plus assay is intended as an aid in the diagnosis of influenza from Nasopharyngeal swab specimens and should not be used as a sole basis for treatment. Nasal washings and aspirates are unacceptable for Xpert Xpress SARS-CoV-2/FLU/RSV testing.  Fact Sheet for Patients: EntrepreneurPulse.com.au  Fact Sheet for Healthcare Providers: IncredibleEmployment.be  This test is not yet approved or cleared by the [place] FDA and has been authorized for detection and/or diagnosis of SARS-CoV-2 by FDA under an Emergency Use Authorization (EUA). This EUA will remain in effect (meaning this test can be used) for the duration of the COVID-19 declaration under Section 564(b)(1) of the Act, 21 U.S.C. section 360bbb-3(b)(1), unless the authorization is terminated or revoked.  Performed at [facility], [place] [address] [place], [place] [address]   Blood culture (routine x 2)     Status: None (Preliminary result)   Collection Time: 01/16/22 12:20 AM   Specimen: BLOOD  Result Value Ref Range Status   Specimen Description BLOOD SITE NOT SPECIFIED  Final   Special Requests   Final    BOTTLES DRAWN AEROBIC AND ANAEROBIC Blood Culture results may not be optimal due to an inadequate volume of blood received in culture bottles   Culture   Final    NO GROWTH 1 DAY Performed at [facility], [place] [address] [place], [place] [address]    Report Status PENDING  Incomplete     Time coordinating discharge: Over 30 minutes  SIGNED:   Darliss Cheney, MD  [facility] 01/17/2022, 12:26 PM  If 7PM-7AM, please contact night-coverage [email or web address]

## 2022-01-17 NOTE — Progress Notes (Signed)
[facility] Progress Note    Assessment/ Plan:   Assessment/Plan:  Acute on chronic hypoxic respiratory failure 2/2 aspiration PNA - on 4L O2 via [place].  CTA with RLL consolidation thought to be impacted mucus vs aspiration.   Dysphagia -  Per PMD. Hypokalemia - K low following dialysis.  Repleted.  Follow labs. Resolved  ESRD -  On HD MWF.  HD today on sched  Hypotension/volume  - Blood pressure soft.  Midodrine started at outpatient HD clinic yesterday.  Started 5mg  TID here.  CXR with vascular congestion and LE edema on exam.  UF as tolerated with HD.  Outpatient EDW recently raised.   Anemia of CKD - Hgb 9.8. no indication for ESA at this time. Fe panel ordered  Secondary Hyperparathyroidism -  Check albumin and phos.  Continue calcitriol.  Nutrition - swallow eval pending, per pmd Chronic combined CHF - per PMD, UF as tolerated w/ HD  Outpatient Dialysis Orders:  MWF - [facility]  3.5hrs, BFR 400, DFR 800,  EDW 61kg, 2K/ 2Ca Access: LU AVF   Heparin none Mircera 50 mcg q2wks - last 2/10 Calcitriol 0.13mcg PO qHD   Gean Quint, MD [facility]  Subjective:   Had significant choking episode with dinner last night per charting, currently NPO, pending swallow eval.   Objective:   BP (!) 114/53 (BP Location: Right Arm)    Pulse 68    Temp 97.6 F (36.4 C) (Oral)    Resp 20    SpO2 100%   Intake/Output Summary (Last 24 hours) at 01/17/2022 0802 Last data filed at 01/16/2022 1500 Gross per 24 hour  Intake 220 ml  Output --  Net 220 ml   Weight change:   Physical Exam: Gen: nad CVS: rrr, s1s2 Resp: fine crackles bibasilar, normal wob Abd: soft, nt/nd Ext: 1+ pitting edema b/l LE's Neuro: awake, alert Dialysis access: LUE AVF +b/t  Imaging: CT Angio Chest PE W and/or Wo Contrast  Result Date: 01/15/2022 CLINICAL DATA:  Concern for pulmonary embolism. EXAM: CT ANGIOGRAPHY CHEST WITH CONTRAST TECHNIQUE: Multidetector CT imaging of the chest was  performed using the standard protocol during bolus administration of intravenous contrast. Multiplanar CT image reconstructions and MIPs were obtained to evaluate the vascular anatomy. RADIATION DOSE REDUCTION: This exam was performed according to the departmental dose-optimization program which includes automated exposure control, adjustment of the mA and/or kV according to patient size and/or use of iterative reconstruction technique. CONTRAST:  48mL OMNIPAQUE IOHEXOL 350 MG/ML SOLN COMPARISON:  Chest radiograph dated 01/15/2022. FINDINGS: Evaluation is limited due to streak artifact caused by patient's arms. Cardiovascular: Mild cardiomegaly. There is retrograde flow of contrast from the right atrium into the IVC suggestive of right heart dysfunction. No pericardial effusion. Advanced 3 vessel coronary vascular calcification. There is moderate atherosclerotic calcification of the thoracic aorta. No aneurysmal dilatation or dissection. There is dilatation of the main pulmonary trunk suggestive of pulmonary hypertension. Evaluation of the pulmonary arteries is limited due to respiratory motion artifact. No central pulmonary artery embolus identified. Faint low attenuating density in the subsegmental right lower lobe pulmonary artery branch (182/6) may be artifactual. A nonocclusive thrombus or scarring is not excluded. Mediastinum/Nodes: No definite mediastinal adenopathy. Evaluation of the hilar lymph nodes is limited due to consolidative changes of the adjacent lungs. There is dilatation of the esophagus with ingested content which may represent delayed clearance or reflux. This can predispose to aspiration. No mediastinal fluid collection. Lungs/Pleura: Small bilateral pleural effusions. There  is a large area of consolidation involving the right lower lobe which may represent atelectasis or infiltrate. Underlying mass is not excluded clinical correlation and follow-up recommended. There is partial compressive  atelectasis of the right middle lobe and left lower lobe versus pneumonia. There is background of emphysema. No pneumothorax. There is occlusion of the bronchus intermedius with impacted mucus secretions versus aspiration content. Upper Abdomen: No acute abnormality. Musculoskeletal: Osteopenia degenerative changes of the spine. No acute osseous pathology. Bilateral gynecomastia. Review of the MIP images confirms the above findings. IMPRESSION: 1. No central pulmonary artery embolus identified. Faint low attenuating density in the subsegmental right lower lobe pulmonary artery branch may be artifactual. A nonocclusive thrombus or scarring is not excluded. 2. Small bilateral pleural effusions with a large area of consolidation involving the right lower lobe which may represent atelectasis or infiltrate. Underlying mass is not excluded. 3. Occlusion of the bronchus intermedius with impacted mucus secretions versus aspiration content. 4. Dilated esophagus with ingested content which may represent delayed clearance or reflux. This can predispose to aspiration. 5. Aortic Atherosclerosis (ICD10-I70.0) and Emphysema (ICD10-J43.9). Electronically Signed   By: Anner Crete M.D.   On: 01/15/2022 20:06   DG Chest Port 1 View  Result Date: 01/15/2022 CLINICAL DATA:  Shortness of breath EXAM: PORTABLE CHEST 1 VIEW COMPARISON:  08/02/2021 FINDINGS: AP portable radiograph. Chin overlies the apices. Midline trachea. Mild cardiomegaly. Small left pleural effusion is unchanged. No pneumothorax. Mild pulmonary venous congestion. Left greater than right base airspace disease. IMPRESSION: 1. Cardiomegaly with mild pulmonary venous congestion. 2. Similar small left pleural effusion with adjacent airspace disease. 3. Probable atelectasis at the right lung base. Electronically Signed   By: Abigail Miyamoto M.D.   On: 01/15/2022 17:07    Labs: BMET Recent Labs  Lab 01/15/22 1712 01/16/22 0146 01/17/22 0112  NA 138 136 140  K  3.1* 3.3* 4.7  CL 96* 96* 100  CO2 31 28 28   GLUCOSE 178* 93 82  BUN 17 22 23   CREATININE 2.39* 2.71* 3.77*  CALCIUM 7.7* 7.7* 8.5*   CBC Recent Labs  Lab 01/15/22 1712 01/16/22 0146 01/17/22 0112  WBC 10.6* 9.6 7.5  NEUTROABS 8.8*  --  5.2  HGB 10.8* 9.6* 9.8*  HCT 35.6* 31.3* 32.2*  MCV 108.9* 109.4* 107.3*  PLT 164 147* 148*    Medications:     Chlorhexidine Gluconate Cloth  6 each Topical Q0600   donepezil  10 mg Oral QPM   heparin  5,000 Units Subcutaneous Q8H   levothyroxine  112 mcg Oral q morning   midodrine  5 mg Oral TID WC   mirtazapine  15 mg Oral QHS   pantoprazole (PROTONIX) IV  40 mg Intravenous Daily   polyethylene glycol  17 g Oral Daily      Gean Quint, MD [facility] 01/17/2022, 8:02 AM

## 2022-01-17 NOTE — Evaluation (Signed)
Clinical/Bedside Swallow Evaluation Patient Details  Name: Lonnie [name] MRN: [id number] Date of Birth: 1925/10/06  Today's Date: 01/17/2022 Time: SLP Start Time (ACUTE ONLY): 42 SLP Stop Time (ACUTE ONLY): 1118 SLP Time Calculation (min) (ACUTE ONLY): 7 min  Past Medical History:  Past Medical History:  Diagnosis Date   Adenomatous polyps    Bursitis    left hip   Chronic kidney disease    Stage 4   Frequent urination at night    GERD (gastroesophageal reflux disease)    Hypertension    Hypothyroidism    Pneumonia    Polyclonal gammopathy determined by serum protein electrophoresis 10/04/2015   Past Surgical History:  Past Surgical History:  Procedure Laterality Date   AORTIC ARCH ANGIOGRAPHY N/A 08/01/2017   Procedure: Aortic Arch Angiography;  Surgeon: Conrad Clear [place], MD;  Location: [facility];  Service: Cardiovascular;  Laterality: N/A;   AV FISTULA PLACEMENT Left 07/10/2016   Procedure: LEFT BRACHIOCEPHALIC ARTERIOVENOUS (AV) FISTULA CREATION;  Surgeon: Conrad Greenup, MD;  Location: [facility];  Service: Vascular;  Laterality: Left;   COLONOSCOPY     RENAL BIOPSY Left 10/06/2015   REVISON OF ARTERIOVENOUS FISTULA Left 45/80/9983   Procedure: PLICATION OF LEFT BRACHIOCEPHALIC ARTERIOVENOUS FISTULA;  Surgeon: Conrad Harlan, MD;  Location: [facility];  Service: Vascular;  Laterality: Left;   SKIN SURGERY     back   UPPER EXTREMITY ANGIOGRAPHY Left 08/01/2017   Procedure: Upper Extremity Angiography;  Surgeon: Conrad McIntosh, MD;  Location: [facility];  Service: Cardiovascular;  Laterality: Left;   HPI:  Pt is a 88 y/o male admitted secondary to weakness and hypoxia. Found to have pna suspected from aspiration or CTA revealed RLLL consolidation thought to be impacted mucus vs aspiration. PMH includes ESRD on HD, CHF, HTN, PVD. MBS 2/723 with appearance of prominent cricopharyngeus and suspected Zenker's diverticulum per radiology PA-C above cricopharyngeal bar. Normal flash  penetration with thin liquid (PAS 2) and recommend continue Dys 3, thin.    Assessment / Plan / Recommendation  Clinical Impression  Pt exhibited mild s/sx aspiration with larger sips thin via straw including delayed cough. No concern with cues for smaller sips. Functional oropharyngeal swallow with solid and puree with observation. He is at chronic  aspiration risk 2/2 Zenker's diverticulum (small)  seen during last week's MBS. It is recommended he continue regular texture and thin liquids, straws allowed with small sips and pills with thin. Pt educated on strategies. He is being discharged today after HD. No further f/u needed/ SLP Visit Diagnosis: Dysphagia, unspecified (R13.10)    Aspiration Risk  Mild aspiration risk    Diet Recommendation Regular;Thin liquid   Liquid Administration via: Straw;Cup Medication Administration: Whole meds with liquid Supervision: Patient able to self feed Compensations: Slow rate;Small sips/bites;Follow solids with liquid Postural Changes: Seated upright at 90 degrees;Remain upright for at least 30 minutes after po intake    Other  Recommendations Oral Care Recommendations: Oral care BID    Recommendations for follow up therapy are one component of a multi-disciplinary discharge planning process, led by the attending physician.  Recommendations may be updated based on patient status, additional functional criteria and insurance authorization.  Follow up Recommendations No SLP follow up      Assistance Recommended at Discharge None  Functional Status Assessment    Frequency and Duration            Prognosis        Swallow Study  General Date of Onset: 01/15/22 HPI: Pt is a 88 y/o male admitted secondary to weakness and hypoxia. Found to have pna suspected from aspiration or CTA revealed RLLL consolidation thought to be impacted mucus vs aspiration. PMH includes ESRD on HD, CHF, HTN, PVD. MBS 2/723 with appearance of prominent cricopharyngeus  and suspected Zenker's diverticulum per radiology PA-C above cricopharyngeal bar. Normal flash penetration with thin liquid (PAS 2) and recommend continue Dys 3, thin. Type of Study: Bedside Swallow Evaluation Previous Swallow Assessment:  (see HPI) Diet Prior to this Study: Regular;Thin liquids Temperature Spikes Noted: No Respiratory Status: Nasal cannula History of Recent Intubation: No Behavior/Cognition: Alert;Cooperative;Pleasant mood Oral Cavity Assessment: Within Functional Limits Oral Care Completed by SLP: No Oral Cavity - Dentition: Dentures, top;Missing dentition Vision: Functional for self-feeding Self-Feeding Abilities: Able to feed self Patient Positioning: Upright in bed Baseline Vocal Quality: Normal    Oral/Motor/Sensory Function Overall Oral Motor/Sensory Function: Within functional limits   Ice Chips Ice chips: Not tested   Thin Liquid Thin Liquid: Impaired Presentation: Straw Oral Phase Impairments:  (none) Pharyngeal  Phase Impairments: Cough - Delayed    Nectar Thick Nectar Thick Liquid: Not tested   Honey Thick Honey Thick Liquid: Not tested   Puree Puree: Within functional limits   Solid     Solid: Within functional limits      Houston Siren 01/17/2022,12:26 PM

## 2022-01-17 NOTE — TOC Initial Note (Signed)
Transition of Care [facility]) - Initial/Assessment Note    Patient Details  Name: Lonnie [name] MRN: [id number] Date of Birth: 1924/12/19  Transition of Care [facility]) CM/SW Contact:    Angelita Ingles, RN Phone [phone]  01/17/2022, 12:29 PM  Clinical Narrative:                 [facility] consulted for patient with high risk for readmission. Patient states that he is from home with his 2 nephews. Patient confirms that he was active with [facility]. Patient states that he does have PCP and outpatient pharmacy although he states that he needs a minute to think about it. PCP listed (Avva, Ravisankar, MD). CM has confirmed that patient is active with [facility] (formally [facility]). Currently there are no other needs. TOC will sign off.  Expected Discharge Plan: [place] Barriers to Discharge: No Barriers Identified   Patient Goals and CMS Choice Patient states their goals for this hospitalization and ongoing recovery are:: Wants to go home with his 2 nephews CMS Medicare.gov Compare Post Acute Care list provided to::  (n/a) Choice offered to / list presented to : Patient ([facility])  Expected Discharge Plan and Services Expected Discharge Plan: [place] In-house Referral: NA Discharge Planning Services: CM Consult Post Acute Care Choice: [address] arrangements for the past 2 months: Single Family Home Expected Discharge Date: 01/17/22               DME Arranged: N/A DME Agency: NA       HH Arranged: PT, OT HH Agency: [place] Date HH Agency Contacted: 01/17/22 Time [place]: 1228 Representative spoke with at [place]: [place] Arrangements/Services Living arrangements for the past 2 months: [place] with:: Self, Other (Comment) (2 nephews) Patient language and need for interpreter reviewed:: Yes Do you feel safe going back to the place where you  live?: Yes      Need for Family Participation in Patient Care: Yes (Comment) Care giver support system in place?: Yes (comment) Current home services: DME Criminal Activity/Legal Involvement Pertinent to Current Situation/Hospitalization: No - Comment as needed  Activities of Daily Living Home Assistive Devices/Equipment: Wheelchair ADL Screening (condition at time of admission) Patient's cognitive ability adequate to safely complete daily activities?: Yes Is the patient deaf or have difficulty hearing?: Yes Does the patient have difficulty seeing, even when wearing glasses/contacts?: Yes Does the patient have difficulty concentrating, remembering, or making decisions?: No Patient able to express need for assistance with ADLs?: Yes Does the patient have difficulty dressing or bathing?: Yes Independently performs ADLs?: No Communication: Independent Is this a change from baseline?: Pre-admission baseline Does the patient have difficulty walking or climbing stairs?: Yes Weakness of Legs: Both Weakness of Arms/Hands: None  Permission Sought/Granted   Permission granted to share information with : No              Emotional Assessment Appearance:: Appears stated age Attitude/Demeanor/Rapport: Engaged, Gracious Affect (typically observed): Pleasant Orientation: : Oriented to Self, Oriented to Place, Oriented to  Time Alcohol / Substance Use: Not Applicable Psych Involvement: No (comment)  Admission diagnosis:  Sepsis ([place]) [A41.9] Community acquired pneumonia of right lower lobe of lung [J18.9] Patient Active Problem List   Diagnosis Date Noted   Acute on chronic respiratory failure with hypoxia ([place]) 01/17/2022   Aspiration pneumonia ([place]) 01/17/2022   Acute on  chronic combined systolic and diastolic CHF (congestive heart failure) ([place]) 01/16/2022   Hypokalemia 01/16/2022   Sepsis ([place]) 01/15/2022   Anemia due to GI blood loss    Hematochezia    GI bleed 12/28/2021    Acute on chronic anemia 12/28/2021   Hyperkalemia 12/28/2021   Leukocytosis 12/28/2021   Acute upper GI bleed 12/28/2021   Generalized weakness 08/02/2021   Hypothyroidism    Hypertension    Elevated troponin    ESRD on dialysis ([place]) 08/24/2016   Monoclonal gammopathy 10/06/2015   Gammopathy, monoclonal    Polyclonal gammopathy determined by serum protein electrophoresis 10/04/2015   Depression 03/29/2015   Intertrigo 05/30/2014   Proteinuria 05/05/2014   PCP:  Prince Solian, MD Pharmacy:   [facility] [place], [place] - [address] [place] [address] Phone: [phone] Fax: [phone]     Social Determinants of Health (SDOH) Interventions    Readmission Risk Interventions Readmission Risk Prevention Plan 01/17/2022  Transportation Screening Complete  HRI or Home Care Consult Complete  Social Work Consult for [place] Planning/Counseling Complete  Palliative Care Screening Not Applicable  Medication Review Press photographer) Referral to Pharmacy  Some recent data might be hidden

## 2022-01-17 NOTE — Progress Notes (Signed)
Contacted by contact center with request to see if pt can receive out-pt HD at clinic tomorrow since pt for d/c today and has not received HD as of yet. Spoke to Goldenrod at [facility] who states clinic can provide treatment tomorrow and pt will need to arrive at 11:00. Renal PA has sent orders to clinic. Spoke to pt's niece, Baird Kay, to make her aware of the above. Niece agreeable to plan and confirms family will provide transport to HD tomorrow. Appt placed on pt's AVS as well. Spoke to Dr Candiss Norse regarding the above and he is agreeable to plan. Update provided to pt's RN who is aware of plan and to contact pt's niece when pt is ready for d/c.   Melven Sartorius Renal Navigator [phone]

## 2022-01-21 LAB — CULTURE, BLOOD (ROUTINE X 2): Culture: NO GROWTH

## 2022-01-31 DEATH — deceased

## 2022-02-06 ENCOUNTER — Encounter (HOSPITAL_COMMUNITY): Payer: Self-pay | Admitting: *Deleted

## 2022-02-13 ENCOUNTER — Ambulatory Visit (HOSPITAL_BASED_OUTPATIENT_CLINIC_OR_DEPARTMENT_OTHER): Payer: Medicare Other | Admitting: General Practice
